# Patient Record
Sex: Female | Born: 1955 | Race: White | Hispanic: No | Marital: Single | Smoking: Current every day smoker
Health system: Southern US, Community
[De-identification: ages and names within clinical notes are randomized; demographics above are authoritative.]

## PROBLEM LIST (undated history)

## (undated) ENCOUNTER — Emergency Department (HOSPITAL_BASED_OUTPATIENT_CLINIC_OR_DEPARTMENT_OTHER): Admission: EM | Payer: Medicare Other | Source: Home / Self Care

## (undated) DIAGNOSIS — I050 Rheumatic mitral stenosis: Secondary | ICD-10-CM

## (undated) DIAGNOSIS — M5126 Other intervertebral disc displacement, lumbar region: Secondary | ICD-10-CM

## (undated) DIAGNOSIS — G8929 Other chronic pain: Secondary | ICD-10-CM

## (undated) DIAGNOSIS — E7800 Pure hypercholesterolemia, unspecified: Secondary | ICD-10-CM

## (undated) DIAGNOSIS — F331 Major depressive disorder, recurrent, moderate: Secondary | ICD-10-CM

## (undated) DIAGNOSIS — M25579 Pain in unspecified ankle and joints of unspecified foot: Secondary | ICD-10-CM

## (undated) DIAGNOSIS — G4730 Sleep apnea, unspecified: Secondary | ICD-10-CM

## (undated) DIAGNOSIS — I2510 Atherosclerotic heart disease of native coronary artery without angina pectoris: Secondary | ICD-10-CM

## (undated) DIAGNOSIS — K219 Gastro-esophageal reflux disease without esophagitis: Secondary | ICD-10-CM

## (undated) DIAGNOSIS — E119 Type 2 diabetes mellitus without complications: Secondary | ICD-10-CM

## (undated) DIAGNOSIS — Z794 Long term (current) use of insulin: Secondary | ICD-10-CM

## (undated) DIAGNOSIS — M5440 Lumbago with sciatica, unspecified side: Secondary | ICD-10-CM

## (undated) DIAGNOSIS — E1165 Type 2 diabetes mellitus with hyperglycemia: Secondary | ICD-10-CM

## (undated) DIAGNOSIS — I071 Rheumatic tricuspid insufficiency: Secondary | ICD-10-CM

## (undated) DIAGNOSIS — I639 Cerebral infarction, unspecified: Secondary | ICD-10-CM

## (undated) DIAGNOSIS — K859 Acute pancreatitis without necrosis or infection, unspecified: Secondary | ICD-10-CM

## (undated) DIAGNOSIS — J189 Pneumonia, unspecified organism: Secondary | ICD-10-CM

## (undated) DIAGNOSIS — R519 Headache, unspecified: Secondary | ICD-10-CM

## (undated) DIAGNOSIS — I351 Nonrheumatic aortic (valve) insufficiency: Secondary | ICD-10-CM

## (undated) DIAGNOSIS — J45909 Unspecified asthma, uncomplicated: Secondary | ICD-10-CM

## (undated) DIAGNOSIS — I700 Atherosclerosis of aorta: Secondary | ICD-10-CM

## (undated) DIAGNOSIS — F17218 Nicotine dependence, cigarettes, with other nicotine-induced disorders: Secondary | ICD-10-CM

## (undated) DIAGNOSIS — I340 Nonrheumatic mitral (valve) insufficiency: Secondary | ICD-10-CM

## (undated) DIAGNOSIS — E6609 Other obesity due to excess calories: Secondary | ICD-10-CM

## (undated) DIAGNOSIS — K760 Fatty (change of) liver, not elsewhere classified: Secondary | ICD-10-CM

## (undated) DIAGNOSIS — Z86718 Personal history of other venous thrombosis and embolism: Secondary | ICD-10-CM

## (undated) DIAGNOSIS — D649 Anemia, unspecified: Secondary | ICD-10-CM

## (undated) DIAGNOSIS — M549 Dorsalgia, unspecified: Secondary | ICD-10-CM

## (undated) DIAGNOSIS — J449 Chronic obstructive pulmonary disease, unspecified: Secondary | ICD-10-CM

## (undated) DIAGNOSIS — Z960 Presence of urogenital implants: Secondary | ICD-10-CM

## (undated) DIAGNOSIS — E66811 Obesity, class 1: Secondary | ICD-10-CM

## (undated) DIAGNOSIS — T7840XA Allergy, unspecified, initial encounter: Secondary | ICD-10-CM

## (undated) DIAGNOSIS — F419 Anxiety disorder, unspecified: Secondary | ICD-10-CM

## (undated) DIAGNOSIS — M1990 Unspecified osteoarthritis, unspecified site: Secondary | ICD-10-CM

## (undated) DIAGNOSIS — T8859XA Other complications of anesthesia, initial encounter: Secondary | ICD-10-CM

## (undated) DIAGNOSIS — R51 Headache: Secondary | ICD-10-CM

## (undated) DIAGNOSIS — N393 Stress incontinence (female) (male): Secondary | ICD-10-CM

## (undated) HISTORY — DX: Allergy, unspecified, initial encounter: T78.40XA

## (undated) HISTORY — DX: Lumbago with sciatica, unspecified side: M54.40

## (undated) HISTORY — DX: Unspecified asthma, uncomplicated: J45.909

## (undated) HISTORY — DX: Cerebral infarction, unspecified: I63.9

## (undated) HISTORY — DX: Other chronic pain: G89.29

## (undated) HISTORY — DX: Chronic obstructive pulmonary disease, unspecified: J44.9

## (undated) HISTORY — DX: Other obesity due to excess calories: E66.09

## (undated) HISTORY — DX: Headache: R51

## (undated) HISTORY — DX: Personal history of other venous thrombosis and embolism: Z86.718

## (undated) HISTORY — DX: Nonrheumatic mitral (valve) insufficiency: I34.0

## (undated) HISTORY — PX: OTHER SURGICAL HISTORY: SHX169

## (undated) HISTORY — DX: Nonrheumatic aortic (valve) insufficiency: I35.1

## (undated) HISTORY — DX: Atherosclerotic heart disease of native coronary artery without angina pectoris: I25.10

## (undated) HISTORY — DX: Presence of urogenital implants: Z96.0

## (undated) HISTORY — DX: Pure hypercholesterolemia, unspecified: E78.00

## (undated) HISTORY — DX: Headache, unspecified: R51.9

## (undated) HISTORY — PX: ANKLE SURGERY: SHX546

## (undated) HISTORY — DX: Major depressive disorder, recurrent, moderate: F33.1

## (undated) HISTORY — DX: Stress incontinence (female) (male): N39.3

## (undated) HISTORY — PX: HERNIA REPAIR: SHX51

## (undated) HISTORY — DX: Atherosclerosis of aorta: I70.0

## (undated) HISTORY — DX: Type 2 diabetes mellitus with hyperglycemia: E11.65

## (undated) HISTORY — DX: Nicotine dependence, cigarettes, with other nicotine-induced disorders: F17.218

## (undated) HISTORY — DX: Type 2 diabetes mellitus without complications: E11.9

## (undated) HISTORY — PX: BREAST REDUCTION SURGERY: SHX8

## (undated) HISTORY — DX: Obesity, class 1: E66.811

## (undated) HISTORY — DX: Rheumatic tricuspid insufficiency: I07.1

## (undated) HISTORY — DX: Pain in unspecified ankle and joints of unspecified foot: M25.579

## (undated) HISTORY — DX: Long term (current) use of insulin: Z79.4

---

## 1986-05-29 HISTORY — PX: VAGINAL HYSTERECTOMY: SUR661

## 1996-05-29 HISTORY — PX: CHOLECYSTECTOMY: SHX55

## 2009-02-03 ENCOUNTER — Emergency Department (HOSPITAL_BASED_OUTPATIENT_CLINIC_OR_DEPARTMENT_OTHER): Admission: EM | Admit: 2009-02-03 | Discharge: 2009-02-03 | Payer: Self-pay | Admitting: Emergency Medicine

## 2009-02-03 ENCOUNTER — Ambulatory Visit: Payer: Self-pay | Admitting: Radiology

## 2010-09-02 LAB — PROTIME-INR
INR: 1 (ref 0.00–1.49)
Prothrombin Time: 12.9 seconds (ref 11.6–15.2)

## 2010-09-02 LAB — URINALYSIS, ROUTINE W REFLEX MICROSCOPIC
Bilirubin Urine: NEGATIVE
Glucose, UA: NEGATIVE mg/dL
Hgb urine dipstick: NEGATIVE
Ketones, ur: NEGATIVE mg/dL
Nitrite: NEGATIVE
Protein, ur: NEGATIVE mg/dL
Specific Gravity, Urine: 1.034 — ABNORMAL HIGH (ref 1.005–1.030)
Urobilinogen, UA: 0.2 mg/dL (ref 0.0–1.0)
pH: 6.5 (ref 5.0–8.0)

## 2010-09-02 LAB — CBC
HCT: 33.7 % — ABNORMAL LOW (ref 36.0–46.0)
Hemoglobin: 11.4 g/dL — ABNORMAL LOW (ref 12.0–15.0)
MCHC: 33.9 g/dL (ref 30.0–36.0)
MCV: 85.1 fL (ref 78.0–100.0)
Platelets: 279 10*3/uL (ref 150–400)
RBC: 3.96 MIL/uL (ref 3.87–5.11)
RDW: 13.8 % (ref 11.5–15.5)
WBC: 6.4 10*3/uL (ref 4.0–10.5)

## 2010-09-02 LAB — DIFFERENTIAL
Basophils Absolute: 0 10*3/uL (ref 0.0–0.1)
Basophils Relative: 0 % (ref 0–1)
Eosinophils Absolute: 0.2 10*3/uL (ref 0.0–0.7)
Eosinophils Relative: 3 % (ref 0–5)
Lymphocytes Relative: 18 % (ref 12–46)
Lymphs Abs: 1.2 10*3/uL (ref 0.7–4.0)
Monocytes Absolute: 0.4 10*3/uL (ref 0.1–1.0)
Monocytes Relative: 6 % (ref 3–12)
Neutro Abs: 4.6 10*3/uL (ref 1.7–7.7)
Neutrophils Relative %: 73 % (ref 43–77)

## 2010-09-02 LAB — URINE CULTURE
Colony Count: NO GROWTH
Culture: NO GROWTH

## 2010-09-02 LAB — BASIC METABOLIC PANEL
BUN: 11 mg/dL (ref 6–23)
CO2: 29 mEq/L (ref 19–32)
Calcium: 9.4 mg/dL (ref 8.4–10.5)
Chloride: 101 mEq/L (ref 96–112)
Creatinine, Ser: 0.9 mg/dL (ref 0.4–1.2)
GFR calc Af Amer: >60 mL/min (ref >60)
GFR calc non Af Amer: >60 mL/min (ref >60)
Glucose, Bld: 100 mg/dL — ABNORMAL HIGH (ref 70–99)
Potassium: 4.1 mEq/L (ref 3.5–5.1)
Sodium: 138 mEq/L (ref 135–145)

## 2010-09-02 LAB — APTT: aPTT: 31 seconds (ref 24–37)

## 2013-09-29 DIAGNOSIS — K861 Other chronic pancreatitis: Secondary | ICD-10-CM | POA: Insufficient documentation

## 2013-09-29 DIAGNOSIS — E119 Type 2 diabetes mellitus without complications: Secondary | ICD-10-CM | POA: Insufficient documentation

## 2013-09-29 DIAGNOSIS — K862 Cyst of pancreas: Secondary | ICD-10-CM | POA: Insufficient documentation

## 2014-02-07 DIAGNOSIS — Z9109 Other allergy status, other than to drugs and biological substances: Secondary | ICD-10-CM | POA: Insufficient documentation

## 2014-02-07 DIAGNOSIS — Z96659 Presence of unspecified artificial knee joint: Secondary | ICD-10-CM | POA: Insufficient documentation

## 2014-03-25 ENCOUNTER — Other Ambulatory Visit: Payer: Self-pay

## 2014-03-25 ENCOUNTER — Other Ambulatory Visit: Payer: Self-pay | Admitting: Obstetrics and Gynecology

## 2014-03-25 DIAGNOSIS — N6002 Solitary cyst of left breast: Secondary | ICD-10-CM

## 2014-05-07 DIAGNOSIS — T85848A Pain due to other internal prosthetic devices, implants and grafts, initial encounter: Secondary | ICD-10-CM | POA: Insufficient documentation

## 2014-05-07 DIAGNOSIS — Z720 Tobacco use: Secondary | ICD-10-CM | POA: Insufficient documentation

## 2014-05-07 DIAGNOSIS — M7662 Achilles tendinitis, left leg: Secondary | ICD-10-CM | POA: Insufficient documentation

## 2014-05-07 DIAGNOSIS — M19171 Post-traumatic osteoarthritis, right ankle and foot: Secondary | ICD-10-CM | POA: Insufficient documentation

## 2014-05-07 DIAGNOSIS — M19071 Primary osteoarthritis, right ankle and foot: Secondary | ICD-10-CM | POA: Insufficient documentation

## 2014-06-02 ENCOUNTER — Ambulatory Visit
Admission: RE | Admit: 2014-06-02 | Discharge: 2014-06-02 | Disposition: A | Discharge status: Home / Self Care | Payer: Medicaid Other | Source: Ambulatory Visit | Attending: Obstetrics and Gynecology | Admitting: Obstetrics and Gynecology

## 2014-06-02 ENCOUNTER — Other Ambulatory Visit: Payer: Self-pay | Admitting: Obstetrics and Gynecology

## 2014-06-02 DIAGNOSIS — N6002 Solitary cyst of left breast: Secondary | ICD-10-CM

## 2014-06-02 DIAGNOSIS — N6310 Unspecified lump in the right breast, unspecified quadrant: Secondary | ICD-10-CM

## 2014-07-20 DIAGNOSIS — M7062 Trochanteric bursitis, left hip: Secondary | ICD-10-CM | POA: Insufficient documentation

## 2014-08-25 ENCOUNTER — Other Ambulatory Visit: Payer: Self-pay | Admitting: Obstetrics and Gynecology

## 2014-09-24 ENCOUNTER — Other Ambulatory Visit: Payer: Self-pay | Admitting: Obstetrics and Gynecology

## 2014-09-24 DIAGNOSIS — N6310 Unspecified lump in the right breast, unspecified quadrant: Secondary | ICD-10-CM

## 2014-11-11 ENCOUNTER — Other Ambulatory Visit: Payer: Medicaid Other

## 2015-03-15 ENCOUNTER — Institutional Professional Consult (permissible substitution): Payer: Medicaid Other | Admitting: Internal Medicine

## 2015-03-24 ENCOUNTER — Ambulatory Visit (INDEPENDENT_AMBULATORY_CARE_PROVIDER_SITE_OTHER): Payer: Medicare Other | Admitting: Internal Medicine

## 2015-03-24 ENCOUNTER — Ambulatory Visit (INDEPENDENT_AMBULATORY_CARE_PROVIDER_SITE_OTHER)
Admission: RE | Admit: 2015-03-24 | Discharge: 2015-03-24 | Disposition: A | Discharge status: Home / Self Care | Payer: Medicare Other | Source: Ambulatory Visit | Attending: Internal Medicine | Admitting: Internal Medicine

## 2015-03-24 ENCOUNTER — Encounter: Payer: Self-pay | Admitting: Internal Medicine

## 2015-03-24 VITALS — BP 114/76 | HR 63 | Ht 62.0 in | Wt 196.2 lb

## 2015-03-24 DIAGNOSIS — F17210 Nicotine dependence, cigarettes, uncomplicated: Secondary | ICD-10-CM | POA: Insufficient documentation

## 2015-03-24 DIAGNOSIS — J449 Chronic obstructive pulmonary disease, unspecified: Secondary | ICD-10-CM

## 2015-03-24 DIAGNOSIS — J441 Chronic obstructive pulmonary disease with (acute) exacerbation: Secondary | ICD-10-CM

## 2015-03-24 NOTE — Progress Notes (Signed)
Subjective:    Patient ID: Rose [name], female    DOB: [birth year],   MRN: [id number]  HPI  79 yowf active smoker referred 03/24/2015 by Rose [name] for copd eval with GOLD II criteria on initial eval.   03/24/2015 1st [facility] office visit/ Rose [name]   Chief Complaint  Patient presents with  . Pulmonary Consult    Pt referred by Rose [name] Rose [name] ) for COPD: pt states she started smoking again and she is having some pain on the left side of her back.  pt c/o increase SOB, chest tightness, dry and prod cough tanish in color and some wheezing.   Pain in L lower chest/ back present x several years and sometimes worse with deep breath and variably sob x years, yeast from symbicort so just uses ventolin rarely until 2 weeks resumed smoked then needed twice daily.  On 02 for years 2lpm at hs and does sleep fine trazadone  Then wakes up usual hour but no need for noct or early am ventolin but does have some am congestion  Best days uses scooter to do shopping due to ankles and feet pain  Much worse overall since resumed smoking 2 week prior to OV    No obvious other patterns in day to day or daytime variabilty or assoc chronic cough or cp orsubjective wheeze or overt   hb symptoms. No unusual exp hx or h/o childhood pna/ asthma or knowledge of premature birth.  Sleeping ok without nocturnal  or early am exacerbation  of respiratory  c/o's or need for noct saba. Also denies any obvious fluctuation of symptoms with weather or environmental changes or other aggravating or alleviating factors except as outlined above   Current Medications, Allergies, Complete Past Medical History, Past Surgical History, Family History, and Social History were reviewed in [employer] record.            Review of Systems  Constitutional: Positive for unexpected weight change. Negative for fever.  HENT: Positive for dental problem, ear pain, rhinorrhea, sinus pressure,  sneezing and sore throat. Negative for congestion, nosebleeds, postnasal drip and trouble swallowing.   Eyes: Positive for redness and itching.  Respiratory: Positive for cough, chest tightness, shortness of breath and wheezing.   Cardiovascular: Positive for leg swelling. Negative for palpitations.  Gastrointestinal: Positive for nausea and vomiting.  Genitourinary: Negative for dysuria.  Musculoskeletal: Positive for joint swelling.  Skin: Negative for rash.  Neurological: Negative for headaches.  Hematological: Bruises/bleeds easily.  Psychiatric/Behavioral: Negative for dysphoric mood. The patient is nervous/anxious.        Objective:   Physical Exam  Hoarse amb wf nad / min  congested cough   Wt Readings from Last 3 Encounters:  03/24/15 196 lb 3.2 oz (88.996 kg)    Vital signs reviewed     HEENT: nl dentition, turbinates, and orophanx. Nl external ear canals without cough reflex   NECK :  without JVD/Nodes/TM/ nl carotid upstrokes bilaterally   LUNGS: no acc muscle use, clear to A and P bilaterally without cough on insp or exp maneuvers   CV:  RRR  no s3 or murmur or increase in P2, no edema   ABD:  soft and nontender with nl excursion in the supine position. No bruits or organomegaly, bowel sounds nl  MS:  warm without deformities, calf tenderness, cyanosis or clubbing  SKIN: warm and dry without lesions    NEURO:  alert, approp, no deficits  CXR PA and Lateral:   03/24/2015 :    I personally reviewed images and agree with radiology impression as follows:    The heart size and mediastinal contours are within normal limits. Both lungs are clear. No evidence of pleural effusion. Mild hyperinflation is suspicious for COPD. The visualized skeletal structures are unremarkable.      Assessment & Plan:

## 2015-03-24 NOTE — Assessment & Plan Note (Signed)

## 2015-03-24 NOTE — Assessment & Plan Note (Addendum)
Spirometry 03/24/2015 FEV1  1.75 (75%) ratio 64  - 03/24/2015   Walked RA  2 laps @ 185 ft each stopped due to End of study, slow pace, no sob or desat  - ankles stopped her   The proper method of use, as well as anticipated side effects, of a metered-dose inhaler are discussed and demonstrated to the patient. Improved effectiveness after extensive coaching during this visit to a level of approximately  90%   She barely meets the criteria GOLD II COPD despite the fact that I'm seeing her on one of her worst days before she used any albuterol at all and note we did not do a postbronchodilator study. The point is that she really has such mild disease she does not need to be evaluated further by a pulmonologist in congestive use when necessary albuterol as she has in the past and she cannot tolerate inhaled steroids and probably should not be on just a lobular given this mildness of her condition.  I also don't think this pain that she's been having for 2 years in the lower left chest and upper back has anything to do with her lungs but offered to review with her any scans of the chest  done in [facility] if she'll bring them back with her since we don't have access to them through the computer.  The main issue that will make a difference to her over the long run is not she sees what medicine she takes that whether she chooses to smoke. Please see cigarette smoking as a separate assessment and plan  I had an extended discussion with the patient reviewing all relevant studies completed to date and  Lasting 35 minutes of a 60 minute visit    Each maintenance medication was reviewed in detail including most importantly the difference between maintenance and prns and under what circumstances the prns are to be triggered using an action plan format that is not reflected in the computer generated alphabetically organized AVS.    Please see instructions for details which were reviewed in writing and the patient  given a copy highlighting the part that I personally wrote and discussed at today's ov.

## 2015-03-24 NOTE — Patient Instructions (Addendum)
You only have mild copd and you always will if you stop smoking now  Only use your albuterol as a rescue medication to be used if you can't catch your breath by resting or doing a relaxed purse lip breathing pattern.  - The less you use it, the better it will work when you need it. - Ok to use up to 2 puffs  every 4 hours if you must but call for immediate appointment if use goes up over your usual need - Don't leave home without it !!  (think of it like the spare tire for your car)   Please remember to go to the x-ray department downstairs for your tests - we will call you with the results when they are available.

## 2015-03-25 NOTE — Progress Notes (Signed)
Quick Note:  Spoke with pt and notified of results per Dr. Sherene Sires. Pt verbalized understanding and denied any questions. Pain in unchanged and she wishes to f/u prn ______

## 2015-03-29 ENCOUNTER — Ambulatory Visit: Payer: Self-pay | Admitting: Podiatry

## 2015-03-30 ENCOUNTER — Encounter: Payer: Self-pay | Admitting: Podiatry

## 2015-03-30 ENCOUNTER — Other Ambulatory Visit: Payer: Self-pay

## 2015-03-30 ENCOUNTER — Ambulatory Visit (INDEPENDENT_AMBULATORY_CARE_PROVIDER_SITE_OTHER): Payer: Medicare Other | Admitting: Podiatry

## 2015-03-30 VITALS — BP 128/83 | HR 97 | Ht 62.0 in | Wt 196.0 lb

## 2015-03-30 DIAGNOSIS — M19172 Post-traumatic osteoarthritis, left ankle and foot: Secondary | ICD-10-CM

## 2015-03-30 DIAGNOSIS — M25572 Pain in left ankle and joints of left foot: Secondary | ICD-10-CM

## 2015-03-30 DIAGNOSIS — M79672 Pain in left foot: Secondary | ICD-10-CM | POA: Diagnosis not present

## 2015-03-30 DIAGNOSIS — M19072 Primary osteoarthritis, left ankle and foot: Secondary | ICD-10-CM | POA: Insufficient documentation

## 2015-03-30 DIAGNOSIS — M79606 Pain in leg, unspecified: Secondary | ICD-10-CM

## 2015-03-30 NOTE — Progress Notes (Signed)
2011 injury to right ankle, broken ankle that was repaired with screw and plate in 9147. Hardware was taken out January 2016.  Sprained left ankle had no treatment other than pain pills.   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  Having intense ankle pain on left x 1 year.   Review of Systems - General ROS: negative for - fatigue, fever, hot flashes, malaise, night sweats or sleep disturbance Ophthalmic ROS: negative ENT ROS: negative Allergy and Immunology ROS: negative Hematological and Lymphatic ROS: negative Endocrine ROS: negative Breast ROS: negative for breast lumps Respiratory ROS: Mild COPD. Cardiovascular ROS: Has leaky heart valva. Gastrointestinal ROS: IBS with diarrhea and constipation. Genito-Urinary ROS: IBS with diarrhea and constipation. Musculoskeletal ROS: Bilateral knee surgeries. Left hip bursitis that is treated with injection. Neurological ROS: Sciatica for 6 years, herniated 3 left lower lumbar disc.  Dermatological ROS: Both arms are getting depigmenation.   Objective: Dermatologic:  Normal findings without any abnormal lesions. Neurologic: All epicritic and tactile sensations grossly intact bilateral. Vascular: All pedal pulses are palpable bilateral. Orthopedic: Unable to go through range of motion on rearfoot and ankle bilateral. Patient is unable to bear pain when joints are moved.  Positive of mild edema bilateral.  Radiographic examination of the left ankle reveal adductus type foot in AP view with mild digital contracture 4th and 5th. No significant movement of sesamoid bones. Lateral view shoe protruding inferior calcaneal spur, posterior os trigonum separated, hypertrophic bone formation at the Talonavicular joint dorsal aspect, and elevated first ray. No abnormal osseous fragments noted in ankle or Subtalar joint area of the left foot.    Assessment: Arthropathy ankles bilateral status post ankle injury.  Sinus tarsitis left foot.  Plan: Reviewed findings and available treatment options. As per request, left Sinus tarsi area was injected with mixture of 4 mg Dexamethasone, 4 mg Triamcinolone, and 1 cc of 0.5% Marcaine plain. Patient tolerated well without difficulty.  Return in one week to discuss the findings and possible MRI referral.

## 2015-03-30 NOTE — Patient Instructions (Signed)
Seen for pain on both foot and ankle. Cortisone injection given to left sinus tarsi area. Return in one week.

## 2015-03-31 ENCOUNTER — Telehealth: Payer: Self-pay | Admitting: *Deleted

## 2015-03-31 NOTE — Telephone Encounter (Signed)
03/31/2015 Dr.Sheard Patient called this afternoon and wanted me to tell you the Cortisone Injection she got yesterday didn't help. She has an appointment to come back next Tuesday and mentioned something also about a MRI??

## 2015-04-06 ENCOUNTER — Ambulatory Visit: Payer: Medicare Other | Admitting: Podiatry

## 2015-04-16 ENCOUNTER — Other Ambulatory Visit: Payer: Self-pay

## 2015-04-16 ENCOUNTER — Encounter: Payer: Self-pay | Admitting: Podiatry

## 2015-04-16 ENCOUNTER — Ambulatory Visit (INDEPENDENT_AMBULATORY_CARE_PROVIDER_SITE_OTHER): Payer: Medicare Other | Admitting: Podiatry

## 2015-04-16 DIAGNOSIS — M25572 Pain in left ankle and joints of left foot: Secondary | ICD-10-CM | POA: Diagnosis not present

## 2015-04-16 DIAGNOSIS — M19072 Primary osteoarthritis, left ankle and foot: Secondary | ICD-10-CM | POA: Diagnosis not present

## 2015-04-16 DIAGNOSIS — M21962 Unspecified acquired deformity of left lower leg: Secondary | ICD-10-CM | POA: Diagnosis not present

## 2015-04-16 DIAGNOSIS — M79605 Pain in left leg: Secondary | ICD-10-CM

## 2015-04-16 MED ORDER — TRIAMCINOLONE ACETONIDE 10 MG/ML IJ SUSP: 10.0000 mg | Freq: Once | INTRAMUSCULAR | Status: DC

## 2015-04-16 MED ORDER — OXYCODONE-ACETAMINOPHEN 10-325 MG PO TABS: 1.0000 | ORAL_TABLET | Freq: Four times a day (QID) | ORAL | Status: DC | PRN

## 2015-04-16 NOTE — Patient Instructions (Signed)
Pain in left ankle.  Reviewed findings and treatment plan. MRI ordered. We will make arrangement. Pain medication prescribed. Another injection given to left ankle. Will contact patient about MRI schedule.

## 2015-04-16 NOTE — Progress Notes (Signed)
Subjective: 59 year old female presents complaining of pain in left ankle. Pain is intense and brings tears.  Last visit she had injection that did not help her. Pain is along the distal margin of lateral Malleoli.  Duration of the pain is over a year. Stated that she was previously treated at [facility] with Orthotics that she could not tolerate.  Past surgical history reveal: 2011 injury to right ankle, broken ankle that was repaired with screw and plate in 3845. Hardware was taken out January 2016.  Sprained left ankle had no treatment other than pain pills.  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   Objective: Dermatologic:  Normal findings without any abnormal lesions. Neurologic: All epicritic and tactile sensations grossly intact bilateral. Vascular: All pedal pulses are palpable bilateral. Orthopedic: Unable to go through range of motion on rearfoot and ankle bilateral. Patient is unable to bear pain when joints are moved.  Positive of mild edema bilateral.  Radiographic examination of the left ankle reveal adductus type foot in AP view with mild digital contracture 4th and 5th. No significant movement of sesamoid bones. Lateral view shoe protruding inferior calcaneal spur, posterior os trigonum separated, hypertrophic bone formation at the Talonavicular joint dorsal aspect, and elevated first ray. No abnormal osseous fragments noted in ankle or Subtalar joint area of the left foot.   Assessment: Arthropathy ankles bilateral status post ankle injury.  Osteoarthritis left ankle. Elevated metatarsal bone with rearfoot pronation upon loading of forefoot.  . Plan: Reviewed findings and available treatment options. As per request 2nd injection given.  The injection was given to left ankle inferior malleoli region with mixture of 4 mg Dexamethasone, 4 mg Triamcinolone, and 1 cc  of 0.5% Marcaine plain. Patient tolerated well without difficulty.  Pain medication prescribed with Percocet 10/325. MRI ordered.

## 2015-04-24 ENCOUNTER — Ambulatory Visit (HOSPITAL_BASED_OUTPATIENT_CLINIC_OR_DEPARTMENT_OTHER)
Admission: RE | Admit: 2015-04-24 | Discharge: 2015-04-24 | Disposition: A | Discharge status: Home / Self Care | Payer: Medicare Other | Source: Ambulatory Visit | Attending: Podiatry | Admitting: Podiatry

## 2015-04-24 DIAGNOSIS — M7672 Peroneal tendinitis, left leg: Secondary | ICD-10-CM | POA: Insufficient documentation

## 2015-04-24 DIAGNOSIS — M659 Synovitis and tenosynovitis, unspecified: Secondary | ICD-10-CM | POA: Insufficient documentation

## 2015-04-24 DIAGNOSIS — M25572 Pain in left ankle and joints of left foot: Secondary | ICD-10-CM | POA: Diagnosis not present

## 2015-04-24 DIAGNOSIS — M7989 Other specified soft tissue disorders: Secondary | ICD-10-CM | POA: Insufficient documentation

## 2015-05-06 ENCOUNTER — Ambulatory Visit: Payer: Medicare Other | Admitting: Podiatry

## 2015-05-12 ENCOUNTER — Ambulatory Visit: Payer: Medicare Other | Admitting: Podiatry

## 2015-05-13 ENCOUNTER — Ambulatory Visit (INDEPENDENT_AMBULATORY_CARE_PROVIDER_SITE_OTHER): Payer: Medicare Other | Admitting: Podiatry

## 2015-05-13 ENCOUNTER — Encounter: Payer: Self-pay | Admitting: Podiatry

## 2015-05-13 VITALS — BP 117/76 | HR 96

## 2015-05-13 DIAGNOSIS — M19072 Primary osteoarthritis, left ankle and foot: Secondary | ICD-10-CM

## 2015-05-13 DIAGNOSIS — M21962 Unspecified acquired deformity of left lower leg: Secondary | ICD-10-CM

## 2015-05-13 MED ORDER — OXYCODONE-ACETAMINOPHEN 10-325 MG PO TABS: 1.0000 | ORAL_TABLET | Freq: Three times a day (TID) | ORAL | Status: DC | PRN

## 2015-05-13 NOTE — Patient Instructions (Signed)
Seen for pain in left foot. MRI report reviewed. Discussed surgical option to realign foot. Cotton osteotomy with bone graft discussed.

## 2015-05-13 NOTE — Progress Notes (Signed)
Subjective: 59 year old female presents to go over the MRI report. The result explained to the patient. Injection is not helping but still having pain at lateral ankle distal lateral to lateral malleoli and swelling at medial ankle left foot.   Past surgical history reveal: 2011 injury to right ankle, broken ankle that was repaired with screw and plate in 7371. Hardware was taken out January 2016.  Sprained left ankle had no treatment other than pain pills.  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   Objective: Dermatologic:  Normal findings without any abnormal lesions. Neurologic: All epicritic and tactile sensations grossly intact bilateral. Vascular: All pedal pulses are palpable bilateral. Orthopedic: Elevated first ray with lateral weight shifting left > right.  Previous x-ray indicated: in AP view with mild digital contracture 4th and 5th. No significant movement of sesamoid bones. Lateral view shoe protruding inferior calcaneal spur, posterior os trigonum separated, hypertrophic bone formation at the Talonavicular joint dorsal aspect, and elevated first ray. No abnormal osseous fragments noted in ankle or Subtalar joint area of the left foot.   Assessment: Arthropathy ankles bilateral status post ankle injury.  Osteoarthritis left ankle. Elevated metatarsal bone with rearfoot pronation upon loading of forefoot.  . Plan: Reviewed findings and available treatment options. Reviewed possible benefit of Cotton osteotomy with bone graft to plantar flex the first ray and restore forefoot alignment.  Consent form reviewed for Cotton osteotomy with bone graft left foot. Pain medication prescribed with Percocet 10/325.

## 2015-06-03 ENCOUNTER — Ambulatory Visit (INDEPENDENT_AMBULATORY_CARE_PROVIDER_SITE_OTHER): Payer: Medicare Other | Admitting: Podiatry

## 2015-06-03 ENCOUNTER — Encounter: Payer: Self-pay | Admitting: Podiatry

## 2015-06-03 VITALS — BP 141/89 | HR 106

## 2015-06-03 DIAGNOSIS — M19072 Primary osteoarthritis, left ankle and foot: Secondary | ICD-10-CM | POA: Diagnosis not present

## 2015-06-03 DIAGNOSIS — M21962 Unspecified acquired deformity of left lower leg: Secondary | ICD-10-CM | POA: Diagnosis not present

## 2015-06-03 MED ORDER — DIPHENHYDRAMINE HCL 25 MG PO TABS: 50.0000 mg | ORAL_TABLET | Freq: Three times a day (TID) | ORAL | Status: DC | PRN

## 2015-06-03 MED ORDER — OXYCODONE-ACETAMINOPHEN 10-325 MG PO TABS: 1.0000 | ORAL_TABLET | Freq: Two times a day (BID) | ORAL | Status: DC | PRN

## 2015-06-03 NOTE — Progress Notes (Signed)
Subjective: 60 year old female presents complaining of painful ankles bilateral.  Achilles tendon on right ankle been hurting x several months. Lost her mother Nov. 3 2016.  Stated that pain medication is only thing that helps her. She was taking with Benadryl that ables her to take Percocet without problem.   Pain on right ankle. Achilles tendon pain on back of heel for a few months. She lost 25 lbs since she lost her mother, 04/01/15.  Injection has not helped. She is not able to wear custom orthotics.  Still having pain at lateral ankle distal lateral to lateral malleoli and swelling at medial ankle left foot.   Past surgical history reveal: 2011 injury to right ankle, broken ankle that was repaired with screw and plate in 3154. Hardware was taken out January 2016.  Sprained left ankle had no treatment other than pain pills.  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   Objective: Dermatologic:  Normal findings without any abnormal lesions. Neurologic: All epicritic and tactile sensations grossly intact bilateral. Vascular: All pedal pulses are palpable bilateral. Orthopedic: Elevated first ray with lateral weight shifting left > right.  Previous x-ray indicated: in AP view with mild digital contracture 4th and 5th. No significant movement of sesamoid bones. Lateral view shoe protruding inferior calcaneal spur, posterior os trigonum separated, hypertrophic bone formation at the Talonavicular joint dorsal aspect, and elevated first ray. No abnormal osseous fragments noted in ankle or Subtalar joint area of the left foot.   Assessment: Arthropathy ankles bilateral status post ankle injury.  Osteoarthritis left ankle. Elevated metatarsal bone with rearfoot pronation upon loading of forefoot.  . Plan: Reviewed findings and available treatment options. She is not able to have any  surgery since there is no one who can take care of her at home.  Pain medication prescribed with Percocet 10/325 with Benadryl as per request.

## 2015-06-03 NOTE — Patient Instructions (Addendum)
Seen for pain in both ankles. May benefit from Ritch brace on left side. Pain medication prescribed.

## 2015-07-12 ENCOUNTER — Encounter: Payer: Self-pay | Admitting: Podiatry

## 2015-07-12 ENCOUNTER — Ambulatory Visit (INDEPENDENT_AMBULATORY_CARE_PROVIDER_SITE_OTHER): Payer: Medicare Other | Admitting: Podiatry

## 2015-07-12 VITALS — BP 134/83 | HR 100

## 2015-07-12 DIAGNOSIS — M21962 Unspecified acquired deformity of left lower leg: Secondary | ICD-10-CM | POA: Diagnosis not present

## 2015-07-12 DIAGNOSIS — M25572 Pain in left ankle and joints of left foot: Secondary | ICD-10-CM | POA: Diagnosis not present

## 2015-07-12 DIAGNOSIS — M19072 Primary osteoarthritis, left ankle and foot: Secondary | ICD-10-CM | POA: Diagnosis not present

## 2015-07-12 MED ORDER — OXYCODONE-ACETAMINOPHEN 10-325 MG PO TABS: 1.0000 | ORAL_TABLET | Freq: Three times a day (TID) | ORAL | Status: DC | PRN

## 2015-07-12 NOTE — Progress Notes (Signed)
Subjective: 60 year old female presents complaining of painful ankles bilateral. Right medial ankle has burning feeling. Stated that pain medication is only thing that helps her. Patient was able to get separate prescription from her PCP to combat her allergic reaction to pain medication.   Past history:  Achilles tendon on right ankle been hurting x several months.  Lost her mother Nov. 3 2016.  Previous injection did not help her.    Past surgical history reveal: 2011 injury to right ankle, broken ankle that was repaired with screw and plate in 9476. Hardware was taken out January 2016.  Sprained left ankle had no treatment other than pain pills.  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   Objective: Dermatologic:  Normal findings without any abnormal lesions. Neurologic: All epicritic and tactile sensations grossly intact bilateral. Vascular: All pedal pulses are palpable bilateral. Positive of mild edema on right ankle medial aspect with pain.  Orthopedic: Elevated first ray with lateral weight shifting left > right.  Previous x-ray indicated: in AP view with mild digital contracture 4th and 5th. No significant movement of sesamoid bones. Lateral view revealed protruding inferior calcaneal spur, posterior os trigonum separated, hypertrophic bone formation at the Talonavicular joint dorsal aspect, and elevated first ray. No abnormal osseous fragments noted in ankle or Subtalar joint area of the left foot.   Assessment: Arthropathy ankles bilateral status post ankle injury.  Osteoarthritis left ankle. Elevated metatarsal bone with rearfoot pronation upon loading of forefoot.  . Plan: Reviewed findings and available treatment options. Patient requested for surgical option on heel spur but wanted to wait till Spring.  Pain medication prescribed with Percocet 10/325.

## 2015-07-12 NOTE — Patient Instructions (Signed)
Seen for bilateral ankle pain.  Not able to have spurs removed at this time. Pain medication renewed. Return as needed.

## 2015-07-20 ENCOUNTER — Other Ambulatory Visit: Payer: Self-pay | Admitting: Physician Assistant

## 2015-07-20 ENCOUNTER — Other Ambulatory Visit: Payer: Self-pay | Admitting: Obstetrics and Gynecology

## 2015-07-20 DIAGNOSIS — N6310 Unspecified lump in the right breast, unspecified quadrant: Secondary | ICD-10-CM

## 2015-07-20 DIAGNOSIS — R928 Other abnormal and inconclusive findings on diagnostic imaging of breast: Secondary | ICD-10-CM

## 2015-08-12 ENCOUNTER — Ambulatory Visit (INDEPENDENT_AMBULATORY_CARE_PROVIDER_SITE_OTHER): Payer: Medicare Other | Admitting: Podiatry

## 2015-08-12 ENCOUNTER — Encounter: Payer: Self-pay | Admitting: Podiatry

## 2015-08-12 VITALS — BP 130/78 | HR 102 | Ht 62.0 in | Wt 179.0 lb

## 2015-08-12 DIAGNOSIS — M21962 Unspecified acquired deformity of left lower leg: Secondary | ICD-10-CM | POA: Diagnosis not present

## 2015-08-12 DIAGNOSIS — M19072 Primary osteoarthritis, left ankle and foot: Secondary | ICD-10-CM | POA: Diagnosis not present

## 2015-08-12 DIAGNOSIS — M79606 Pain in leg, unspecified: Secondary | ICD-10-CM

## 2015-08-12 MED ORDER — OXYCODONE-ACETAMINOPHEN 10-325 MG PO TABS: 1.0000 | ORAL_TABLET | Freq: Three times a day (TID) | ORAL | Status: DC | PRN

## 2015-08-12 NOTE — Patient Instructions (Addendum)
Pain in both ankles and heels. Medication re-ordered. Return as needed.

## 2015-08-12 NOTE — Progress Notes (Signed)
Subjective: 60 year old female presents complaining of painful ankles bilateral. Stated that pain medication is only thing that helps her. Patient was able to get separate prescription from her PCP to combat her allergic reaction to pain medication.   Past history:  Achilles tendon on right ankle been hurting x several months.  Lost her mother Nov. 3, 2016.  Previous injection did not help her.   Past surgical history reveal: 2011 injury to right ankle, broken ankle that was repaired with screw and plate in 4166. Hardware was taken out January 2016.  Sprained left ankle had no treatment other than pain pills.  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   Objective: Dermatologic:  Normal findings without any abnormal lesions. Neurologic: All epicritic and tactile sensations grossly intact bilateral. Vascular: All pedal pulses are palpable bilateral. Positive of mild edema on right ankle medial aspect with pain. Increased warmth on right ankle joint. Orthopedic: Elevated first ray with lateral weight shifting left > right. Severe pain with range of motion on both rearfoot.   Previous x-ray indicated: in AP view with mild digital contracture 4th and 5th. No significant movement of sesamoid bones.  Lateral view revealed protruding inferior calcaneal spur, posterior os trigonum separated, hypertrophic bone formation at the Talonavicular joint dorsal aspect, and elevated first ray.  No abnormal osseous fragments in ankle or Subtalar joint area of the left foot.   Assessment: Arthropathy ankles bilateral, status post ankle injury left.  Osteoarthritis left ankle. Elevated metatarsal bone with rearfoot pronation upon loading of forefoot.  . Plan: Reviewed findings and available treatment options. Patient requested for surgical option on heel spur but wanted to wait till Spring.  Pain  medication prescribed with Percocet 10/325.

## 2015-09-09 ENCOUNTER — Encounter: Payer: Self-pay | Admitting: Podiatry

## 2015-09-09 ENCOUNTER — Ambulatory Visit (INDEPENDENT_AMBULATORY_CARE_PROVIDER_SITE_OTHER): Payer: Medicare Other | Admitting: Podiatry

## 2015-09-09 VITALS — BP 122/72 | HR 97

## 2015-09-09 DIAGNOSIS — M722 Plantar fascial fibromatosis: Secondary | ICD-10-CM | POA: Diagnosis not present

## 2015-09-09 DIAGNOSIS — M79671 Pain in right foot: Secondary | ICD-10-CM | POA: Diagnosis not present

## 2015-09-09 DIAGNOSIS — M216X9 Other acquired deformities of unspecified foot: Secondary | ICD-10-CM | POA: Diagnosis not present

## 2015-09-09 DIAGNOSIS — M79604 Pain in right leg: Secondary | ICD-10-CM

## 2015-09-09 MED ORDER — OXYCODONE-ACETAMINOPHEN 10-325 MG PO TABS: 1.0000 | ORAL_TABLET | Freq: Three times a day (TID) | ORAL | Status: DC | PRN

## 2015-09-09 NOTE — Progress Notes (Signed)
Subjective: 60 year old female presents complaining of pain in right lateral heel, with difficulty walking x 3 weeks.  Stated that she is scheduled for procedure in her neck. Left foot pain still the same.  Right ankle bulges out with pain on heel, plantar lateral.  Past history:  Achilles tendon on right ankle been hurting x several months.  Lost her mother Nov. 3, 2016.  Previous injection did not help her.   Past surgical history reveal: 2011 injury to right ankle, broken ankle that was repaired with screw and plate in 3559. Hardware was taken out January 2016.  Sprained left ankle had no treatment other than pain pills.  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   Objective: Dermatologic:  Normal findings without any abnormal lesions. Neurologic: All epicritic and tactile sensations grossly intact bilateral. Vascular: All pedal pulses are palpable bilateral. Positive of mild edema on right ankle medial aspect with pain. Increased warmth on right ankle joint. Orthopedic: Elevated first ray with lateral weight shifting left > right. Severe pain with range of motion on both rearfoot.   Previous x-ray indicated: in AP view with mild digital contracture 4th and 5th. No significant movement of sesamoid bones.  Lateral view revealed protruding inferior calcaneal spur, posterior os trigonum separated, hypertrophic bone formation at the Talonavicular joint dorsal aspect, and elevated first ray.  No abnormal osseous fragments in ankle or Subtalar joint area of the left foot.   Assessment: Plantar fasciitis right heel. Arthropathy ankles bilateral, status post ankle injury left.  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 Elevated metatarsal bone with rearfoot pronation upon loading of forefoot.  . Plan: Reviewed findings and available treatment options. Reviewed  possible benefit from STJ arthroereisis on right to reduce excess ankle pronation and heel pain. As per discussion, right lateral heel injected with mixture of 4 mg Dexamethasone, 4 mg Triamcinolone, and 1 cc of 0.5% Marcaine plain. Patient tolerated well without difficulty.  Pain medication prescribed with Percocet 10/325.

## 2015-09-09 NOTE — Patient Instructions (Signed)
Seen for pain in right heel. Injection given. Return as needed.

## 2015-10-06 ENCOUNTER — Encounter: Payer: Self-pay | Admitting: Podiatry

## 2015-10-06 ENCOUNTER — Ambulatory Visit (INDEPENDENT_AMBULATORY_CARE_PROVIDER_SITE_OTHER): Payer: Medicare Other | Admitting: Podiatry

## 2015-10-06 VITALS — BP 115/71 | HR 98

## 2015-10-06 DIAGNOSIS — M19072 Primary osteoarthritis, left ankle and foot: Secondary | ICD-10-CM

## 2015-10-06 DIAGNOSIS — M216X9 Other acquired deformities of unspecified foot: Secondary | ICD-10-CM

## 2015-10-06 DIAGNOSIS — M79605 Pain in left leg: Secondary | ICD-10-CM

## 2015-10-06 DIAGNOSIS — M79673 Pain in unspecified foot: Secondary | ICD-10-CM | POA: Diagnosis not present

## 2015-10-06 MED ORDER — OXYCODONE-ACETAMINOPHEN 10-325 MG PO TABS: 1.0000 | ORAL_TABLET | Freq: Three times a day (TID) | ORAL | Status: DC | PRN

## 2015-10-06 NOTE — Progress Notes (Signed)
Subjective: 60 year old female presents complaining of pain in right ankle at medial malleoli area x 2 weeks.  Also hurts at lateral ankle beneath lateral malleoli area. Patient request for Cortisone injection.  Past injection lasted for about a week.  Stated that after her last two surgeries her ankle has been hurting.   Past history:  Achilles tendon on right ankle been hurting x several months.  Lost her mother Nov. 3, 2016.  Previous injection did not help her.   Past surgical history reveal: 2011 injury to right ankle, broken ankle that was repaired with screw and plate in 1157. Hardware was taken out January 2016.  Sprained left ankle had no treatment other than pain pills.  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   Objective: Dermatologic:  Normal findings without any abnormal lesions. Neurologic: All epicritic and tactile sensations grossly intact bilateral. Vascular: All pedal pulses are palpable bilateral. Positive of mild edema on right ankle medial aspect with pain. Increased warmth on right ankle joint. Orthopedic: Elevated first ray with lateral weight shifting left > right. Severe pain with range of motion on both rearfoot.   Previous x-ray indicated: in AP view with mild digital contracture 4th and 5th. No significant movement of sesamoid bones.  Lateral view revealed protruding inferior calcaneal spur, posterior os trigonum separated, hypertrophic bone formation at the Talonavicular joint dorsal aspect, and elevated first ray.  No abnormal osseous fragments in ankle or Subtalar joint area of the left foot.   Assessment: Plantar fasciitis right heel. Arthropathy ankles bilateral, status post ankle injury left.  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 Elevated metatarsal bone with rearfoot pronation upon loading of forefoot.   . Plan: Reviewed findings and available treatment options. Reviewed possible benefit from STJ arthroereisis on right to reduce excess ankle pronation and heel pain. As per discussion, injection given to right ankle at anterior medial aspect of right ankle, about 2 cm medial to the medial malleoli with mixture of 4 mg Dexamethasone, 4 mg Triamcinolone, and 1 cc of 0.5% Marcaine plain. Patient tolerated well without difficulty.  Pain medication prescribed with Percocet 10/325

## 2015-10-06 NOTE — Patient Instructions (Signed)
Seen for pain in right ankle. Injection given and pain medication prescribed. Return as needed.

## 2015-11-09 ENCOUNTER — Encounter: Payer: Self-pay | Admitting: Podiatry

## 2015-11-09 ENCOUNTER — Ambulatory Visit (INDEPENDENT_AMBULATORY_CARE_PROVIDER_SITE_OTHER): Payer: Medicare Other | Admitting: Podiatry

## 2015-11-09 VITALS — BP 107/73 | HR 107

## 2015-11-09 DIAGNOSIS — M79673 Pain in unspecified foot: Secondary | ICD-10-CM

## 2015-11-09 DIAGNOSIS — M19072 Primary osteoarthritis, left ankle and foot: Secondary | ICD-10-CM | POA: Diagnosis not present

## 2015-11-09 DIAGNOSIS — M216X9 Other acquired deformities of unspecified foot: Secondary | ICD-10-CM | POA: Diagnosis not present

## 2015-11-09 DIAGNOSIS — M79604 Pain in right leg: Secondary | ICD-10-CM

## 2015-11-09 MED ORDER — OXYCODONE-ACETAMINOPHEN 10-325 MG PO TABS: 1.0000 | ORAL_TABLET | Freq: Three times a day (TID) | ORAL | Status: DC | PRN

## 2015-11-09 NOTE — Progress Notes (Signed)
Subjective: 59 year old female presents complaining of pain in right ankle at medial malleoli area x 2 weeks.  Also hurts at lateral ankle beneath lateral malleoli area. Patient request for Cortisone injection.  Past injection lasted for short time but still request for another injection to left ankle.    Past history:  Achilles tendonitis several months.  Lost her mother Nov. 3, 2016.  Previous injection did not help her.   Past surgical history reveal: 2011 injury to right ankle, broken ankle that was repaired with screw and plate in 0459. Hardware was taken out January 2016.  Sprained left ankle had no treatment other than pain pills.  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   Objective: Dermatologic:  Normal findings without any abnormal lesions. Neurologic: All epicritic and tactile sensations grossly intact bilateral. Vascular: All pedal pulses are palpable bilateral. Positive of mild edema on right ankle medial aspect with pain. Increased warmth on right ankle joint. Orthopedic: Elevated first ray with lateral weight shifting left > right. Severe pain with range of motion on both rearfoot.   Previous x-ray indicated: in AP view with mild digital contracture 4th and 5th. No significant movement of sesamoid bones.  Lateral view revealed protruding inferior calcaneal spur, posterior os trigonum separated, hypertrophic bone formation at the Talonavicular joint dorsal aspect, and elevated first ray.  No abnormal osseous fragments in ankle or Subtalar joint area of the left foot.   Assessment: Plantar fasciitis right heel. Arthropathy ankles bilateral, status post ankle injury left.  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 Elevated metatarsal bone with rearfoot pronation upon loading of forefoot.  . Plan: Reviewed findings and available treatment  options. Reviewed possible benefit from STJ arthroereisis on right to reduce excess ankle pronation and heel pain. As per discussion, injection given to left ankle at anterior lateral, just anterior to Fibular malleoli.  Injection consisted of a mixture of 4 mg Dexamethasone, 4 mg Triamcinolone, and 1 cc of 0.5% Marcaine plain. Patient tolerated well without difficulty.  As per request, pain medication prescribed with Percocet 10/325

## 2015-11-09 NOTE — Patient Instructions (Signed)
Seen for painful ankle. Cortisone injection given as pre request. Return as needed.  Filled out for medical necessity for AFO.

## 2015-11-16 DIAGNOSIS — F439 Reaction to severe stress, unspecified: Secondary | ICD-10-CM | POA: Insufficient documentation

## 2015-11-18 ENCOUNTER — Other Ambulatory Visit: Payer: Medicare Other

## 2015-11-24 ENCOUNTER — Other Ambulatory Visit: Payer: Medicare Other

## 2015-12-01 ENCOUNTER — Other Ambulatory Visit: Payer: Medicare Other

## 2015-12-03 ENCOUNTER — Ambulatory Visit (INDEPENDENT_AMBULATORY_CARE_PROVIDER_SITE_OTHER): Payer: Medicare Other | Admitting: Podiatry

## 2015-12-03 ENCOUNTER — Encounter: Payer: Self-pay | Admitting: Podiatry

## 2015-12-03 VITALS — BP 134/76 | HR 88

## 2015-12-03 DIAGNOSIS — M19072 Primary osteoarthritis, left ankle and foot: Secondary | ICD-10-CM

## 2015-12-03 DIAGNOSIS — M216X9 Other acquired deformities of unspecified foot: Secondary | ICD-10-CM

## 2015-12-03 DIAGNOSIS — M79673 Pain in unspecified foot: Secondary | ICD-10-CM

## 2015-12-03 DIAGNOSIS — M79604 Pain in right leg: Secondary | ICD-10-CM

## 2015-12-03 MED ORDER — OXYCODONE-ACETAMINOPHEN 10-325 MG PO TABS: 1.0000 | ORAL_TABLET | Freq: Three times a day (TID) | ORAL | Status: DC | PRN

## 2015-12-03 NOTE — Patient Instructions (Signed)
Seen for pain in left ankle. Injection given. Rx for pain given. Return as needed.

## 2015-12-03 NOTE — Progress Notes (Signed)
Subjective: 60 year old female presents complaining of pain in left lateral ankle and request for injection and pain medication.   Past history:  Achilles tendonitis several months.  Lost her mother Nov. 3, 2016.  Multiple injections in ankle joint with limited help.   Past surgical history reveal: 2011 injury to right ankle, broken ankle that was repaired with screw and plate in 5809. Hardware was taken out January 2016.  Sprained left ankle had no treatment other than pain pills.  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   Objective: Dermatologic:  Normal findings without any abnormal lesions. Neurologic: All epicritic and tactile sensations grossly intact bilateral. Vascular: All pedal pulses are palpable bilateral. Positive of mild edema on right ankle medial aspect with pain. Increased warmth on right ankle joint. Orthopedic: Elevated first ray with lateral weight shifting left > right. Severe pain with range of motion on both rearfoot.   Previous x-ray indicated: in AP view with mild digital contracture 4th and 5th. No significant movement of sesamoid bones.  Lateral view revealed protruding inferior calcaneal spur, posterior os trigonum separated, hypertrophic bone formation at the Talonavicular joint dorsal aspect, and elevated first ray.  No abnormal osseous fragments in ankle or Subtalar joint area of the left foot.   Assessment: Arthropathy ankles bilateral, status post ankle injury left.  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 Elevated metatarsal bone with rearfoot pronation upon loading of forefoot.  . Plan: Reviewed findings and available treatment options. Reviewed possible benefit from STJ arthroereisis on right to reduce excess ankle pronation and heel pain. As per discussion, injection given to left ankle at anterior lateral, just  anterior to Fibular malleoli.  Injection consisted of a mixture of 4 mg Dexamethasone, 4 mg Triamcinolone, and 1 cc of 0.5% Marcaine plain. Patient tolerated well without difficulty.  As per request, pain medication prescribed with Percocet 10/325

## 2015-12-06 ENCOUNTER — Other Ambulatory Visit: Payer: Medicare Other

## 2015-12-08 ENCOUNTER — Ambulatory Visit
Admission: RE | Admit: 2015-12-08 | Discharge: 2015-12-08 | Disposition: A | Discharge status: Home / Self Care | Payer: Medicare Other | Source: Ambulatory Visit | Attending: Physician Assistant | Admitting: Physician Assistant

## 2015-12-08 DIAGNOSIS — R928 Other abnormal and inconclusive findings on diagnostic imaging of breast: Secondary | ICD-10-CM

## 2015-12-08 DIAGNOSIS — N6310 Unspecified lump in the right breast, unspecified quadrant: Secondary | ICD-10-CM

## 2016-01-06 ENCOUNTER — Ambulatory Visit (INDEPENDENT_AMBULATORY_CARE_PROVIDER_SITE_OTHER): Payer: Medicare Other | Admitting: Podiatry

## 2016-01-06 ENCOUNTER — Encounter: Payer: Self-pay | Admitting: Podiatry

## 2016-01-06 VITALS — BP 140/79 | HR 92

## 2016-01-06 DIAGNOSIS — M216X9 Other acquired deformities of unspecified foot: Secondary | ICD-10-CM

## 2016-01-06 DIAGNOSIS — M6588 Other synovitis and tenosynovitis, other site: Secondary | ICD-10-CM | POA: Diagnosis not present

## 2016-01-06 DIAGNOSIS — M19072 Primary osteoarthritis, left ankle and foot: Secondary | ICD-10-CM

## 2016-01-06 DIAGNOSIS — M659 Synovitis and tenosynovitis, unspecified: Secondary | ICD-10-CM

## 2016-01-06 DIAGNOSIS — M79604 Pain in right leg: Secondary | ICD-10-CM | POA: Diagnosis not present

## 2016-01-06 MED ORDER — OXYCODONE-ACETAMINOPHEN 10-325 MG PO TABS: 1.0000 | ORAL_TABLET | Freq: Three times a day (TID) | ORAL | 0 refills | Status: DC | PRN

## 2016-01-06 NOTE — Progress Notes (Signed)
Subjective: 60 year old female presents complaining of pain in left lateral ankle and request for pain medication.  Been wearing AFO brace dispensed by 3rd party. They are helping but coming apart.  Past history:  Achilles tendonitis several months.  Lost her mother Nov. 3, 2016.  Multiple injections in ankle joint with limited help.   Past surgical history reveal: 2011 injury to right ankle, broken ankle that was repaired with screw and plate in 3785. Hardware was taken out January 2016.  Sprained left ankle had no treatment other than pain pills.  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   Objective: Dermatologic:  Normal findings without any abnormal lesions. Neurologic: All epicritic and tactile sensations grossly intact bilateral. Vascular: All pedal pulses are palpable bilateral. Positive of mild edema on right ankle medial aspect with pain. Increased warmth on right ankle joint. Orthopedic: Elevated first ray with lateral weight shifting left > right. Severe pain with range of motion on both rearfoot.   Previous x-ray indicated: in AP view with mild digital contracture 4th and 5th. No significant movement of sesamoid bones.  Lateral view revealed protruding inferior calcaneal spur, posterior os trigonum separated, hypertrophic bone formation at the Talonavicular joint dorsal aspect, and elevated first ray.  No abnormal osseous fragments in ankle or Subtalar joint area of the left foot.   Assessment: Arthropathy ankles bilateral, status post ankle injury left.  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 Elevated metatarsal bone with rearfoot pronation upon loading of forefoot.  . Plan: Reviewed findings and available treatment options. As per request, pain medication prescribed with Percocet 10/325

## 2016-01-06 NOTE — Patient Instructions (Signed)
Seen for pain in left foot. Pain medication prescribed.

## 2016-01-18 ENCOUNTER — Encounter: Payer: Self-pay | Admitting: *Deleted

## 2016-01-19 ENCOUNTER — Encounter: Payer: Self-pay | Admitting: Emergency Medicine

## 2016-01-19 ENCOUNTER — Ambulatory Visit (INDEPENDENT_AMBULATORY_CARE_PROVIDER_SITE_OTHER): Payer: Medicare Other | Admitting: Emergency Medicine

## 2016-01-19 VITALS — BP 100/66 | HR 80 | Ht 63.0 in | Wt 185.0 lb

## 2016-01-19 DIAGNOSIS — J449 Chronic obstructive pulmonary disease, unspecified: Secondary | ICD-10-CM

## 2016-01-19 DIAGNOSIS — F17210 Nicotine dependence, cigarettes, uncomplicated: Secondary | ICD-10-CM

## 2016-01-19 DIAGNOSIS — H01131 Eczematous dermatitis of right upper eyelid: Secondary | ICD-10-CM | POA: Insufficient documentation

## 2016-01-19 DIAGNOSIS — H01134 Eczematous dermatitis of left upper eyelid: Secondary | ICD-10-CM | POA: Insufficient documentation

## 2016-01-19 DIAGNOSIS — Z720 Tobacco use: Secondary | ICD-10-CM | POA: Diagnosis not present

## 2016-01-19 DIAGNOSIS — Z122 Encounter for screening for malignant neoplasm of respiratory organs: Secondary | ICD-10-CM

## 2016-01-19 DIAGNOSIS — Z87891 Personal history of nicotine dependence: Secondary | ICD-10-CM | POA: Diagnosis not present

## 2016-01-19 DIAGNOSIS — H52223 Regular astigmatism, bilateral: Secondary | ICD-10-CM | POA: Insufficient documentation

## 2016-01-19 MED ORDER — ALBUTEROL SULFATE HFA 108 (90 BASE) MCG/ACT IN AERS: 2.0000 | INHALATION_SPRAY | Freq: Four times a day (QID) | RESPIRATORY_TRACT | 2 refills | Status: DC | PRN

## 2016-01-19 NOTE — Progress Notes (Signed)
Subjective:    Patient ID: Rose [name], female    DOB: 1955-10-01, 60 y.o.   MRN: [id number]  HPI 60 year old woman with history of tobacco use (20-30 pk-yrs), Now using vapor. She carries a diagnosis of COPD and has been seen in our office before by Dr Sherene Sires. She had spirometry 03/24/15 That suggested moderate airflow obstruction. She also carries a history of diabetes, hyperlipidemia. She returns today with dyspnea and chest discomfort that she describes as. She is concerned about her potential risk for lung cancer given her tobacco history in her family history. She is not currently on any bronchodilators. She has a lot of cough, occasionally productive of brown. She has chest pain, mid chest, happens daily and responds to NTG. She is on omeprazole. She had cardiac stress testing at [facility] cardiology last year, she is not sure of the results. She has been on inhaled medications before from hospital and her PCP - she isn';t sure which ones. She now has ventolin that she uses prn, about 2x a day.    Review of Systems  Constitutional: Positive for appetite change and unexpected weight change.  HENT: Negative for congestion, postnasal drip and rhinorrhea.   Respiratory: Positive for cough and shortness of breath. Negative for wheezing and stridor.   Cardiovascular: Positive for chest pain. Negative for leg swelling.  Gastrointestinal: Positive for abdominal pain.  Psychiatric/Behavioral: The patient is nervous/anxious.    Past Medical History:  Diagnosis Date  . Allergy   . Asthma   . Chronic headaches   . Diabetes (HCC)   . High cholesterol   . History of blood clots   . Stroke [facility])      Family History  Problem Relation Age of Onset  . Emphysema Mother   . Heart disease Mother   . Cancer Mother   . Heart disease Father      Social History   Social History  . Marital status: Single    Spouse name: N/A  . Number of children: N/A  . Years of education: N/A   Occupational  History  . Not on file.   Social History Main Topics  . Smoking status: Current Every Day Smoker    Packs/day: 0.50    Years: 30.00    Types: Cigarettes, E-cigarettes  . Smokeless tobacco: Never Used  . Alcohol use Not on file  . Drug use: Unknown  . Sexual activity: Not on file   Other Topics Concern  . Not on file   Social History Narrative  . No narrative on file     Allergies  Allergen Reactions  . Nickel Rash and Shortness Of Breath  . Oxycodone Shortness Of Breath  . Codeine Swelling  . Hydrocodone-Acetaminophen Itching  . Morphine Itching  . Propoxyphene Other (See Comments)  . Quetiapine     Other reaction(s): Other (See Comments) Restless leg syndrome     Outpatient Medications Prior to Visit  Medication Sig Dispense Refill  . aspirin EC 81 MG tablet Take 81 mg by mouth.    . clonazePAM (KLONOPIN) 1 MG tablet Take 1 mg by mouth.    . cyclobenzaprine (FLEXERIL) 10 MG tablet TK 1 T PO TID PRF MUSCLE SPASM  0  . diphenhydrAMINE (BENADRYL) 25 MG tablet Take 2 tablets (50 mg total) by mouth every 8 (eight) hours as needed. 60 tablet 0  . docusate sodium (COLACE) 100 MG capsule Take 100 mg by mouth.    . DULoxetine (CYMBALTA) 60 MG capsule Take  1 capsule by mouth 2 (two) times daily.    Marland Kitchen estradiol (ESTRACE) 0.5 MG tablet TK 1 T PO D  1  . gabapentin (NEURONTIN) 600 MG tablet TK 1 T PO TID  0  . meloxicam (MOBIC) 15 MG tablet TK 1 T PO D  0  . metFORMIN (GLUCOPHAGE-XR) 500 MG 24 hr tablet Take 2 tablets by mouth daily.    . nitroGLYCERIN (NITROSTAT) 0.4 MG SL tablet Place 0.4 mg under the tongue every 5 (five) minutes as needed for chest pain.    Marland Kitchen nystatin-triamcinolone (MYCOLOG II) cream Apply 1 application topically 2 (two) times daily.    Marland Kitchen omega-3 acid ethyl esters (LOVAZA) 1 G capsule TK 2 CS PO BID  6  . omeprazole (PRILOSEC) 20 MG capsule TK ONE C PO BID  6  . oxyCODONE-acetaminophen (PERCOCET) 10-325 MG tablet Take 1 tablet by mouth every 8 (eight) hours  as needed for pain. 60 tablet 0  . traZODone (DESYREL) 100 MG tablet TK 2 TS PO QHS  1   Facility-Administered Medications Prior to Visit  Medication Dose Route Frequency Provider Last Rate Last Dose  . triamcinolone acetonide (KENALOG) 10 MG/ML injection 10 mg  10 mg Other Once [employer], DPM            Objective:   Physical Exam Vitals:   01/19/16 1455 01/19/16 1457  BP:  100/66  Pulse:  80  SpO2:  95%  Weight: 185 lb (83.9 kg)   Height: 5\' 3"  (1.6 m)    Gen: Pleasant, overwt, in no distress,  normal affect  ENT: No lesions,  mouth clear,  oropharynx clear, no postnasal drip, very deep voice  Neck: No JVD, no TMG, no carotid bruits  Lungs: No use of accessory muscles, clear without rales or rhonchi, No wheeze on forced expiration  Cardiovascular: RRR, heart sounds normal, no murmur or gallops, no peripheral edema  Musculoskeletal: No deformities, no cyanosis or clubbing  Neuro: alert, non focal  Skin: Warm, no lesions or rashes   03/24/15 --  COMPARISON:  None.  FINDINGS: The heart size and mediastinal contours are within normal limits. Both lungs are clear. No evidence of pleural effusion. Mild hyperinflation is suspicious for COPD. The visualized skeletal structures are unremarkable.  IMPRESSION: No active cardiopulmonary disease.      Assessment & Plan:  Cigarette smoker Patient states that she smokes just under a pack of cigarettes for approximately 30 years. I suspect that her total exposure is at or above 30 pack years. She is right on the border of the targeted group for lung cancer screening. Also note that she continues to use vapor. With a positive family history and approximate 30-pack-year exposure think it would be appropriate to refer her for low-dose CT scan screening.  COPD  GOLD II Continue Ventolin when necessary for now. I suspect she needs to be on scheduled bronchodilators. She's been started on other inhaled medicine before but  she can't recall the names. I will perform full PFT and consider starting scheduled medication at her next visit. Continue Ventolin for now.   03/26/15, MD, PhD 01/19/2016, 3:23 PM [facility] [phone] or if no answer [phone]

## 2016-01-19 NOTE — Assessment & Plan Note (Signed)
Continue Ventolin when necessary for now. I suspect she needs to be on scheduled bronchodilators. She's been started on other inhaled medicine before but she can't recall the names. I will perform full PFT and consider starting scheduled medication at her next visit. Continue Ventolin for now.

## 2016-01-19 NOTE — Patient Instructions (Signed)
We will refer you to our lung cancer screening Coordinator to discuss the program and to set up CT scan of her chest Please continue to use Ventolin 2 puffs as needed for shortness of breath We will arrange for full pulmonary function testing Continue to work on decreasing and hopefully stopping vapor use Follow with Dr Delton Coombes next available with PFT on the same day.

## 2016-01-19 NOTE — Assessment & Plan Note (Signed)
Patient states that she smokes just under a pack of cigarettes for approximately 30 years. I suspect that her total exposure is at or above 30 pack years. She is right on the border of the targeted group for lung cancer screening. Also note that she continues to use vapor. With a positive family history and approximate 30-pack-year exposure think it would be appropriate to refer her for low-dose CT scan screening.

## 2016-01-20 ENCOUNTER — Other Ambulatory Visit: Payer: Self-pay | Admitting: Acute Care

## 2016-01-20 DIAGNOSIS — Z87891 Personal history of nicotine dependence: Secondary | ICD-10-CM

## 2016-02-09 ENCOUNTER — Encounter: Payer: Self-pay | Admitting: Podiatry

## 2016-02-09 ENCOUNTER — Ambulatory Visit (INDEPENDENT_AMBULATORY_CARE_PROVIDER_SITE_OTHER): Payer: Medicare Other | Admitting: Podiatry

## 2016-02-09 VITALS — BP 132/84 | HR 95

## 2016-02-09 DIAGNOSIS — M659 Synovitis and tenosynovitis, unspecified: Secondary | ICD-10-CM

## 2016-02-09 DIAGNOSIS — M6588 Other synovitis and tenosynovitis, other site: Secondary | ICD-10-CM

## 2016-02-09 DIAGNOSIS — M79605 Pain in left leg: Secondary | ICD-10-CM

## 2016-02-09 DIAGNOSIS — M25572 Pain in left ankle and joints of left foot: Secondary | ICD-10-CM | POA: Diagnosis not present

## 2016-02-09 MED ORDER — OXYCODONE-ACETAMINOPHEN 10-325 MG PO TABS: 1.0000 | ORAL_TABLET | Freq: Three times a day (TID) | ORAL | 0 refills | Status: DC | PRN

## 2016-02-09 NOTE — Progress Notes (Signed)
Subjective: 60 year old female presents complaining of pain in left lateral ankle and request for pain medication.  Patient points left lateral sinus tarsi area just anterior distal to lateral malleoli area being the source of pain.  Not wearing brace. They rubs her skin.  Patient request pain medication refilled. Will pass injection for now.   Past history:  Achilles tendonitis several months.  Lost her mother Nov. 3, 2016.  Multiple injections in ankle joint with limited help.   Past surgical history reveal: 2011 injury to right ankle, broken ankle that was repaired with screw and plate in 7591. Hardware was taken out January 2016.  Sprained left ankle had no treatment other than pain pills.  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   Objective: Dermatologic:  Normal findings without any abnormal lesions. Neurologic: All epicritic and tactile sensations grossly intact bilateral. Vascular: All pedal pulses are palpable bilateral. No visible edema noted on left ankle. Orthopedic: Elevated first ray with lateral weight shifting left >right. Severe pain with range of motion on both rearfoot.   Previous x-ray indicated:in AP view with mild digital contracture 4th and 5th. No significant movement of sesamoid bones.  Lateral view revealed protruding inferior calcaneal spur, posterior os trigonum separated, hypertrophic bone formation at the Talonavicular joint dorsal aspect, and elevated first ray.  No abnormal osseous fragments in ankle or Subtalar joint area of the left foot.   Assessment: Arthropathy ankles bilateral, status post ankle injury left.  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 Elevated metatarsal bone with rearfoot pronation upon loading of forefoot.  . Plan: Reviewed findings and available treatment options. As per request, pain  medication prescribed with Percocet 10/325

## 2016-02-09 NOTE — Patient Instructions (Signed)
Seen for pain in left ankle. Reviewed findings and pain medication refilled as per request. Return as needed.

## 2016-03-01 ENCOUNTER — Encounter: Payer: Self-pay | Admitting: Podiatry

## 2016-03-01 ENCOUNTER — Ambulatory Visit (INDEPENDENT_AMBULATORY_CARE_PROVIDER_SITE_OTHER): Payer: Medicare Other | Admitting: Podiatry

## 2016-03-01 VITALS — BP 132/79 | HR 97

## 2016-03-01 DIAGNOSIS — M659 Synovitis and tenosynovitis, unspecified: Secondary | ICD-10-CM | POA: Diagnosis not present

## 2016-03-01 DIAGNOSIS — M79605 Pain in left leg: Secondary | ICD-10-CM | POA: Diagnosis not present

## 2016-03-01 DIAGNOSIS — M19072 Primary osteoarthritis, left ankle and foot: Secondary | ICD-10-CM | POA: Diagnosis not present

## 2016-03-01 MED ORDER — OXYCODONE-ACETAMINOPHEN 10-325 MG PO TABS: 1.0000 | ORAL_TABLET | Freq: Three times a day (TID) | ORAL | 0 refills | Status: DC | PRN

## 2016-03-01 NOTE — Progress Notes (Signed)
Subjective: 60 year old female presents complaining of pain in left lateral ankle and request for an injection.  Been hurting for 3 weeks.  Patient points posterior inferior to the lateral malleoli of left ankle being the source of pain. Walks with a cane and uses ankle brace.  Patient also having pain over shoulder and back of neck for over a year and is planning to see orthopedic doctor.  Past surgical history reveal: 2011 injury to right ankle, broken ankle that was repaired with screw and plate in 2094. Hardware was taken out January 2016.  Sprained left ankle 2011 with unresolved ankle pain.  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   Objective: Dermatologic:  Normal findings without any abnormal lesions. Neurologic: All epicritic and tactile sensations grossly intact bilateral. Vascular: All pedal pulses are palpable bilateral.  No acute edema or increased temperature noted. Orthopedic: Elevated first ray with lateral weight shifting left >right.  Severe pain with range of motion on both subtalar and ankle joint motion bilateral. .   Assessment: Chronic foot and ankle pain with a. S/P Sprained left ankle 2011.  Severe ankle pronation bilateral. Elevated metatarsal bone with rearfoot pronation upon loading of forefoot.  . Plan: Reviewed findings and available treatment options. As per request, left posterolateral ankle joint Injected with mixture of 4 mg Dexamethasone, 4 mg Triamcinolone, and 1 cc of 0.5% Marcaine plain. Patient tolerated well without difficulty.  As per request, pain medication prescribed with Percocet 10/325. Return as needed.

## 2016-03-01 NOTE — Patient Instructions (Signed)
Seen for pain in left ankle. As per request cortisone injection given and pain medication refilled. Return as needed.

## 2016-03-02 ENCOUNTER — Telehealth: Payer: Self-pay | Admitting: Acute Care

## 2016-03-02 DIAGNOSIS — Z87891 Personal history of nicotine dependence: Secondary | ICD-10-CM

## 2016-03-03 NOTE — Telephone Encounter (Signed)
Routing to lung nodule pool.  

## 2016-03-06 ENCOUNTER — Encounter: Payer: Medicare Other | Admitting: Acute Care

## 2016-03-06 ENCOUNTER — Inpatient Hospital Stay: Admission: RE | Admit: 2016-03-06 | Payer: Medicare Other | Source: Ambulatory Visit

## 2016-03-08 NOTE — Telephone Encounter (Signed)
LVM for pt to return call

## 2016-03-17 ENCOUNTER — Ambulatory Visit: Payer: Medicare Other | Admitting: Emergency Medicine

## 2016-03-24 DIAGNOSIS — F988 Other specified behavioral and emotional disorders with onset usually occurring in childhood and adolescence: Secondary | ICD-10-CM | POA: Insufficient documentation

## 2016-03-24 DIAGNOSIS — F32A Depression, unspecified: Secondary | ICD-10-CM | POA: Insufficient documentation

## 2016-03-24 DIAGNOSIS — R413 Other amnesia: Secondary | ICD-10-CM | POA: Insufficient documentation

## 2016-03-24 NOTE — Telephone Encounter (Signed)
Patient called back to r/s appt - She can be reached at [phone] -pr

## 2016-03-28 ENCOUNTER — Ambulatory Visit: Payer: Medicare Other | Admitting: Podiatry

## 2016-04-05 ENCOUNTER — Ambulatory Visit (INDEPENDENT_AMBULATORY_CARE_PROVIDER_SITE_OTHER): Payer: Medicare Other | Admitting: Podiatry

## 2016-04-05 ENCOUNTER — Encounter: Payer: Self-pay | Admitting: Podiatry

## 2016-04-05 DIAGNOSIS — M19072 Primary osteoarthritis, left ankle and foot: Secondary | ICD-10-CM | POA: Diagnosis not present

## 2016-04-05 DIAGNOSIS — M659 Synovitis and tenosynovitis, unspecified: Secondary | ICD-10-CM | POA: Diagnosis not present

## 2016-04-05 MED ORDER — PROMETHAZINE HCL 12.5 MG PO TABS: 12.5000 mg | ORAL_TABLET | Freq: Three times a day (TID) | ORAL | 2 refills | Status: DC | PRN

## 2016-04-05 MED ORDER — OXYCODONE-ACETAMINOPHEN 10-325 MG PO TABS: 1.0000 | ORAL_TABLET | Freq: Three times a day (TID) | ORAL | 0 refills | Status: DC | PRN

## 2016-04-05 NOTE — Progress Notes (Signed)
Subjective: 60 year old female presents complaining of pain in both feet and request for injection and Rx for pain medication. This has been on going problem for the last 2-3 years off and on.   Patient points posterior inferior to the lateral malleoli of both ankles being the source of pain. Walks with a cane and uses ankle brace.   Past surgical history reveal: 2011 injury to right ankle, broken ankle that was repaired with screw and plate in 1027. Hardware was taken out January 2016.  Sprained left ankle 2011 with unresolved ankle pain.  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   Objective: No abnormal skin lesions on both feet. Neurologic: All epicritic and tactile sensations grossly intact bilateral. Vascular: All pedal pulses are palpable bilateral.  No acute edema, erythema, or increased temperature noted. Orthopedic: Elevated first ray with lateral weight shifting left >right.  Severe pain with range of motion on both subtalar and ankle joint motion bilateral. .   Assessment: Chronic foot and ankle pain bilateral. S/P Sprained left ankle 2011 Severe ankle pronation bilateral. Elevated metatarsal bone with rearfoot pronation upon loading of forefoot bilateral.  . Plan: Reviewed findings and available treatment options. Advised to wait a while before getting another injection.  As per request, pain medication prescribed with Percocet 10/325. Return as needed.

## 2016-04-05 NOTE — Patient Instructions (Addendum)
Seen for pain in both feet.  Had injection last month. Will wait another month for next injection.  Pain medication prescribed. Return as needed.

## 2016-04-06 ENCOUNTER — Telehealth: Payer: Self-pay | Admitting: Acute Care

## 2016-04-14 NOTE — Telephone Encounter (Signed)
Routed to lung nodule pool °

## 2016-04-21 NOTE — Telephone Encounter (Signed)
See other phone note on this pt.

## 2016-04-28 NOTE — Telephone Encounter (Signed)
Called and spoke with pt. Informed her of the wait time for lung cancer screening. Pt verbalized understanding.

## 2016-05-03 ENCOUNTER — Ambulatory Visit (INDEPENDENT_AMBULATORY_CARE_PROVIDER_SITE_OTHER): Payer: Medicare Other | Admitting: Podiatry

## 2016-05-03 ENCOUNTER — Encounter: Payer: Self-pay | Admitting: Podiatry

## 2016-05-03 VITALS — BP 147/87 | HR 95

## 2016-05-03 DIAGNOSIS — M79604 Pain in right leg: Secondary | ICD-10-CM | POA: Diagnosis not present

## 2016-05-03 DIAGNOSIS — M659 Synovitis and tenosynovitis, unspecified: Secondary | ICD-10-CM

## 2016-05-03 DIAGNOSIS — M216X9 Other acquired deformities of unspecified foot: Secondary | ICD-10-CM

## 2016-05-03 DIAGNOSIS — M65979 Unspecified synovitis and tenosynovitis, unspecified ankle and foot: Secondary | ICD-10-CM

## 2016-05-03 MED ORDER — OXYCODONE-ACETAMINOPHEN 10-325 MG PO TABS: 1.0000 | ORAL_TABLET | Freq: Three times a day (TID) | ORAL | 0 refills | Status: DC | PRN

## 2016-05-03 NOTE — Patient Instructions (Addendum)
Pain in right ankle from turning in. Reviewed findings and possible surgical option, Subtalar joint Arthroereisis, implant surgery right foot. If this procedure fails to improve to satisfactory level, it may require further procedure, Lapidus fusion of the first Metatarsocuneiform joint right foot. Surgery consent form reviewed.

## 2016-05-03 NOTE — Progress Notes (Signed)
Pain in right ankle that swells and bulging out.  Subjective: 60 year old female presents complaining of pain and swelling in right ankle.  This has been on going problem for the last 2-3 years off and on.   Stated that her ankle brace was sent out for repair.   Past surgical history reveal: 2011 injury to right ankle, broken ankle that was repaired with screw and plate in 7121. Hardware was taken out January 2016.  Sprained left ankle 2011 with unresolved ankle pain.  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   Objective: No abnormal skin lesions on both feet. Neurologic: All epicritic and tactile sensations grossly intact bilateral. Vascular: Edema on right medial ankle with pain. Orthopedic: Elevated first ray with lateral weight shifting left >right.  Excess STJ and ankle joint pronation with weight bearing right. Severe pain with range of motion on both subtalar and ankle joint motion bilateral. .   Radiographic examination reveal excess first ray elevation in lateral view. Anterior break in CYMA line right foot.   Assessment: Chronic foot and ankle pain bilateral. S/P Sprained left ankle 2011 Severe ankle and STJ pronation bilateral R>L. Elevated metatarsal bone with rearfoot pronation upon loading of forefoot bilateral.  . Plan: Reviewed findings and available treatment options.  Reviewed Possible STJ Arthroereisis on right foot. May need further procedure, Lapidus fusion if pain persist.  Surgery consent form reviewed for STJ arthroereisis right foot.  As per request, pain medication prescribed with Percocet 10/325.

## 2016-05-11 DIAGNOSIS — R1084 Generalized abdominal pain: Secondary | ICD-10-CM | POA: Insufficient documentation

## 2016-05-11 DIAGNOSIS — K219 Gastro-esophageal reflux disease without esophagitis: Secondary | ICD-10-CM | POA: Insufficient documentation

## 2016-05-11 DIAGNOSIS — K921 Melena: Secondary | ICD-10-CM | POA: Insufficient documentation

## 2016-05-11 DIAGNOSIS — R110 Nausea: Secondary | ICD-10-CM | POA: Insufficient documentation

## 2016-05-11 DIAGNOSIS — Z86010 Personal history of colonic polyps: Secondary | ICD-10-CM | POA: Insufficient documentation

## 2016-05-15 ENCOUNTER — Encounter: Payer: Self-pay | Admitting: Emergency Medicine

## 2016-05-15 ENCOUNTER — Ambulatory Visit (INDEPENDENT_AMBULATORY_CARE_PROVIDER_SITE_OTHER): Payer: Medicare Other | Admitting: Emergency Medicine

## 2016-05-15 VITALS — BP 110/70 | HR 95 | Ht 63.0 in | Wt 186.0 lb

## 2016-05-15 DIAGNOSIS — J449 Chronic obstructive pulmonary disease, unspecified: Secondary | ICD-10-CM

## 2016-05-15 DIAGNOSIS — F17210 Nicotine dependence, cigarettes, uncomplicated: Secondary | ICD-10-CM

## 2016-05-15 LAB — PULMONARY FUNCTION TEST
DL/VA % pred: 60 %
DL/VA: 2.88 ml/min/mmHg/L
DLCO cor % pred: 65 %
DLCO cor: 15.99 ml/min/mmHg
DLCO unc % pred: 65 %
DLCO unc: 15.99 ml/min/mmHg
FEF 25-75 Post: 1.84 L/sec
FEF 25-75 Pre: 1.61 L/sec
FEF2575-%Change-Post: 14 %
FEF2575-%Pred-Post: 77 %
FEF2575-%Pred-Pre: 68 %
FEV1-%Change-Post: 5 %
FEV1-%Pred-Post: 99 %
FEV1-%Pred-Pre: 94 %
FEV1-Post: 2.55 L
FEV1-Pre: 2.42 L
FEV1FVC-%Change-Post: 2 %
FEV1FVC-%Pred-Pre: 88 %
FEV6-%Change-Post: 1 %
FEV6-%Pred-Post: 111 %
FEV6-%Pred-Pre: 109 %
FEV6-Post: 3.57 L
FEV6-Pre: 3.5 L
FEV6FVC-%Change-Post: 0 %
FEV6FVC-%Pred-Post: 101 %
FEV6FVC-%Pred-Pre: 102 %
FVC-%Change-Post: 2 %
FVC-%Pred-Post: 109 %
FVC-%Pred-Pre: 106 %
FVC-Post: 3.62 L
FVC-Pre: 3.52 L
Post FEV1/FVC ratio: 70 %
Post FEV6/FVC ratio: 98 %
Pre FEV1/FVC ratio: 69 %
Pre FEV6/FVC Ratio: 99 %
RV % pred: 127 %
RV: 2.53 L
TLC % pred: 119 %
TLC: 6.06 L

## 2016-05-15 NOTE — Patient Instructions (Addendum)
Please continue to have albuterol available to use 2 puffs as needed for shortness of breath.  We will hold off on starting a daily inhaler at this time.  The most important thing for [place] to do will be to stop smoking. We will make a plan to work on this more when your anxiety is improving.  We will refer you to the lung cancer screening program.  Follow with Dr Delton Coombes in 6 months or sooner if you have any problems

## 2016-05-15 NOTE — Progress Notes (Signed)
Subjective:    Patient ID: Rose [name], female    DOB: Apr 09, 1956, 60 y.o.   MRN: [id number]  HPI 60 year old woman with history of tobacco use (20-30 pk-yrs), Now using vapor. She carries a diagnosis of COPD and has been seen in our office before by Dr Sherene Sires. She had spirometry 03/24/15 That suggested moderate airflow obstruction. She also carries a history of diabetes, hyperlipidemia. She returns today with dyspnea and chest discomfort that she describes as. She is concerned about her potential risk for lung cancer given her tobacco history in her family history. She is not currently on any bronchodilators. She has a lot of cough, occasionally productive of brown. She has chest pain, mid chest, happens daily and responds to NTG. She is on omeprazole. She had cardiac stress testing at [facility] cardiology last year, she is not sure of the results. She has been on inhaled medications before from hospital and her PCP - she isn';t sure which ones. She now has ventolin that she uses prn, about 2x a day.   ROV 05/15/16 -- This follow-up visit for patient with a history use and presumed COPD. She underwent pulmonary function testing today that I have personally reviewed. This showed mild obstruction without a bronchodilator response, normal volumes and a decreased diffusion capacity that did not correct into the normal range when adjusted for alveolar volume. She is currently managed on albuterol as needed. Tells me that she uses it about 2x a week. Continues to smoke about . She has quit before. She is having some back pain.    Review of Systems  Constitutional: Positive for appetite change and unexpected weight change.  HENT: Negative for congestion, postnasal drip and rhinorrhea.   Respiratory: Positive for cough and shortness of breath. Negative for wheezing and stridor.   Cardiovascular: Positive for chest pain. Negative for leg swelling.  Gastrointestinal: Positive for abdominal pain.    Psychiatric/Behavioral: The patient is nervous/anxious.     Past Medical History:  Diagnosis Date  . Allergy   . Asthma   . Chronic headaches   . Diabetes (HCC)   . High cholesterol   . History of blood clots   . Stroke [facility])      Family History  Problem Relation Age of Onset  . Emphysema Mother   . Heart disease Mother   . Cancer Mother   . Heart disease Father      Social History   Social History  . Marital status: Single    Spouse name: N/A  . Number of children: N/A  . Years of education: N/A   Occupational History  . Not on file.   Social History Main Topics  . Smoking status: Current Every Day Smoker    Packs/day: 1.00    Years: 30.00    Types: Cigarettes, E-cigarettes  . Smokeless tobacco: Never Used     Comment: Heavy secondhand smoke exposure  . Alcohol use Not on file  . Drug use: Unknown  . Sexual activity: Not on file   Other Topics Concern  . Not on file   Social History Narrative  . No narrative on file     Allergies  Allergen Reactions  . Nickel Rash and Shortness Of Breath  . Oxycodone Shortness Of Breath  . Codeine Swelling  . Hydrocodone-Acetaminophen Itching  . Morphine Itching  . Propoxyphene Other (See Comments)  . Quetiapine     Other reaction(s): Other (See Comments) Restless leg syndrome     Outpatient Medications  Prior to Visit  Medication Sig Dispense Refill  . albuterol (PROVENTIL HFA;VENTOLIN HFA) 108 (90 Base) MCG/ACT inhaler Inhale 2 puffs into the lungs every 6 (six) hours as needed for wheezing or shortness of breath. 1 Inhaler 2  . aspirin EC 81 MG tablet Take 81 mg by mouth.    . clonazePAM (KLONOPIN) 1 MG tablet Take 1 mg by mouth.    . DULoxetine (CYMBALTA) 60 MG capsule Take 1 capsule by mouth 2 (two) times daily.    Marland Kitchen estradiol (ESTRACE) 0.5 MG tablet TK 1 T PO D  1  . gabapentin (NEURONTIN) 600 MG tablet TK 1 T PO TID  0  . metFORMIN (GLUCOPHAGE-XR) 500 MG 24 hr tablet Take 2 tablets by mouth daily.    .  nitroGLYCERIN (NITROSTAT) 0.4 MG SL tablet Place 0.4 mg under the tongue every 5 (five) minutes as needed for chest pain.    Marland Kitchen nystatin-triamcinolone (MYCOLOG II) cream Apply 1 application topically 2 (two) times daily.    Marland Kitchen omega-3 acid ethyl esters (LOVAZA) 1 G capsule TK 2 CS PO BID  6  . omeprazole (PRILOSEC) 20 MG capsule TK ONE C PO BID  6  . oxyCODONE-acetaminophen (PERCOCET) 10-325 MG tablet Take 1 tablet by mouth every 8 (eight) hours as needed for pain. 60 tablet 0  . traZODone (DESYREL) 100 MG tablet TK 2 TS PO QHS  1  . cyclobenzaprine (FLEXERIL) 10 MG tablet TK 1 T PO TID PRF MUSCLE SPASM  0  . diphenhydrAMINE (BENADRYL) 25 MG tablet Take 2 tablets (50 mg total) by mouth every 8 (eight) hours as needed. (Patient not taking: Reported on 05/15/2016) 60 tablet 0  . docusate sodium (COLACE) 100 MG capsule Take 100 mg by mouth.    . meloxicam (MOBIC) 15 MG tablet TK 1 T PO D  0   Facility-Administered Medications Prior to Visit  Medication Dose Route Frequency Provider Last Rate Last Dose  . triamcinolone acetonide (KENALOG) 10 MG/ML injection 10 mg  10 mg Other Once [employer], DPM            Objective:   Physical Exam Vitals:   05/15/16 1123 05/15/16 1127  BP:  110/70  Pulse:  95  SpO2:  96%  Weight: 186 lb (84.4 kg)   Height: 5\' 3"  (1.6 m)    Gen: Pleasant, overwt, in no distress,  normal affect  ENT: No lesions,  mouth clear,  oropharynx clear, no postnasal drip, very deep voice  Neck: No JVD, no TMG, no carotid bruits  Lungs: No use of accessory muscles, clear without rales or rhonchi, No wheeze on forced expiration  Cardiovascular: RRR, heart sounds normal, no murmur or gallops, no peripheral edema  Musculoskeletal: No deformities, no cyanosis or clubbing  Neuro: alert, non focal  Skin: Warm, no lesions or rashes   03/24/15 --  COMPARISON:  None.  FINDINGS: The heart size and mediastinal contours are within normal limits. Both lungs are clear. No  evidence of pleural effusion. Mild hyperinflation is suspicious for COPD. The visualized skeletal structures are unremarkable.  IMPRESSION: No active cardiopulmonary disease.      Assessment & Plan:  COPD, mild Based on pulmonary function testing today. She is managing symptoms with albuterol when necessary. I don't think we need to start scheduled bronchodilators at this time. Most important thing is for her to stop smoking and we discussed this in detail today. She has multiple social stressors which are barriers right now. We  will revisit. His interested in  lung cancer screening and I'll refer her to the lung cancer screening program today.  Levy Pupa, MD, PhD 05/15/2016, 11:45 AM [facility] [phone] or if no answer [phone]

## 2016-05-15 NOTE — Addendum Note (Signed)
Addended by: Cydney Ok on: 05/15/2016 11:55 AM   Modules accepted: Orders

## 2016-05-15 NOTE — Assessment & Plan Note (Signed)
Based on pulmonary function testing today. She is managing symptoms with albuterol when necessary. I don't think we need to start scheduled bronchodilators at this time. Most important thing is for her to stop smoking and we discussed this in detail today. She has multiple social stressors which are barriers right now. We will revisit. His interested in  lung cancer screening and I'll refer her to the lung cancer screening program today.

## 2016-05-24 ENCOUNTER — Encounter: Payer: Self-pay | Admitting: Acute Care

## 2016-05-24 NOTE — Telephone Encounter (Signed)
Spoke with patient Scheduled SDMV Jun 05, 2016 CT order was placed Pt voiced understanding and had not further questions.

## 2016-05-31 ENCOUNTER — Encounter: Payer: Self-pay | Admitting: Podiatry

## 2016-05-31 ENCOUNTER — Ambulatory Visit (INDEPENDENT_AMBULATORY_CARE_PROVIDER_SITE_OTHER): Payer: Medicare Other | Admitting: Podiatry

## 2016-05-31 VITALS — BP 146/93 | HR 97

## 2016-05-31 DIAGNOSIS — M19072 Primary osteoarthritis, left ankle and foot: Secondary | ICD-10-CM | POA: Diagnosis not present

## 2016-05-31 DIAGNOSIS — M216X9 Other acquired deformities of unspecified foot: Secondary | ICD-10-CM | POA: Diagnosis not present

## 2016-05-31 DIAGNOSIS — M659 Synovitis and tenosynovitis, unspecified: Secondary | ICD-10-CM

## 2016-05-31 MED ORDER — OXYCODONE-ACETAMINOPHEN 10-325 MG PO TABS: 1.0000 | ORAL_TABLET | Freq: Three times a day (TID) | ORAL | 0 refills | Status: DC | PRN

## 2016-05-31 NOTE — Patient Instructions (Signed)
Pain in left ankle. Injection has not helped long enough to be satisfied. Pain medication prescribed.

## 2016-05-31 NOTE — Progress Notes (Signed)
Subjective: 61 year old female presents stating that she has left ankle pain today. Also having discomfort at the back of right Achilles tendon old surgery incision site following an injury.  Her last injection did not help but for a week.  This has been on going problem for the last 2-3 years off and on.  Patient is wearing winter boots.   Past surgical history reveal: 2011 injury to right ankle, broken ankle that was repaired with screw and plate in 4801. Hardware was taken out January 2016.  Sprained left ankle 2011 with unresolved ankle pain.  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   Objective: No changes since the last visit.  No abnormal skin lesions on both feet. Neurologic: All epicritic and tactile sensations grossly intact bilateral. Vascular: Edema on right medial ankle with pain. Orthopedic: Elevated first ray with lateral weight shifting left >right.  Stiff pronated STJ and ankle joint bilateral.  Severe pain with end range of motion on both subtalar and ankle joint motion bilateral.  Assessment: Arthropathy ankle joint bilateral.  S/P Sprained left ankle 2011 Severe ankle and STJ pronation bilateral R>L. Elevated metatarsal bone with rearfoot pronation upon loading of forefoot bilateral. . Plan: Reviewed findings and available treatment options.  Explained that if ankle pain becomes severe, it might require to have her ankle fusion done to stop the pain.

## 2016-06-05 ENCOUNTER — Telehealth: Payer: Self-pay | Admitting: Acute Care

## 2016-06-05 ENCOUNTER — Inpatient Hospital Stay: Admission: RE | Admit: 2016-06-05 | Payer: Medicare Other | Source: Ambulatory Visit

## 2016-06-05 ENCOUNTER — Encounter: Payer: Medicare Other | Admitting: Acute Care

## 2016-06-05 DIAGNOSIS — F17210 Nicotine dependence, cigarettes, uncomplicated: Secondary | ICD-10-CM

## 2016-06-05 NOTE — Telephone Encounter (Signed)
Will route to lung nodule pool. 

## 2016-06-23 NOTE — Telephone Encounter (Signed)
Called and spoke with pt and she is aware of SD[facility] appt with SG on 2/9 at 2 and her CT has been scheduled as well. Nothing further is needed.

## 2016-07-01 ENCOUNTER — Emergency Department (HOSPITAL_BASED_OUTPATIENT_CLINIC_OR_DEPARTMENT_OTHER): Payer: Medicare Other

## 2016-07-01 ENCOUNTER — Encounter (HOSPITAL_BASED_OUTPATIENT_CLINIC_OR_DEPARTMENT_OTHER): Payer: Self-pay | Admitting: Emergency Medicine

## 2016-07-01 ENCOUNTER — Emergency Department (HOSPITAL_BASED_OUTPATIENT_CLINIC_OR_DEPARTMENT_OTHER)
Admission: EM | Admit: 2016-07-01 | Discharge: 2016-07-01 | Disposition: A | Discharge status: Home / Self Care | Payer: Medicare Other | Attending: Emergency Medicine | Admitting: Emergency Medicine

## 2016-07-01 DIAGNOSIS — J45909 Unspecified asthma, uncomplicated: Secondary | ICD-10-CM | POA: Insufficient documentation

## 2016-07-01 DIAGNOSIS — F17210 Nicotine dependence, cigarettes, uncomplicated: Secondary | ICD-10-CM | POA: Insufficient documentation

## 2016-07-01 DIAGNOSIS — J449 Chronic obstructive pulmonary disease, unspecified: Secondary | ICD-10-CM | POA: Insufficient documentation

## 2016-07-01 DIAGNOSIS — H9201 Otalgia, right ear: Secondary | ICD-10-CM | POA: Insufficient documentation

## 2016-07-01 DIAGNOSIS — Z7984 Long term (current) use of oral hypoglycemic drugs: Secondary | ICD-10-CM | POA: Diagnosis not present

## 2016-07-01 DIAGNOSIS — R21 Rash and other nonspecific skin eruption: Secondary | ICD-10-CM | POA: Diagnosis not present

## 2016-07-01 DIAGNOSIS — E119 Type 2 diabetes mellitus without complications: Secondary | ICD-10-CM | POA: Insufficient documentation

## 2016-07-01 DIAGNOSIS — R1013 Epigastric pain: Secondary | ICD-10-CM | POA: Insufficient documentation

## 2016-07-01 DIAGNOSIS — Z7982 Long term (current) use of aspirin: Secondary | ICD-10-CM | POA: Diagnosis not present

## 2016-07-01 DIAGNOSIS — R110 Nausea: Secondary | ICD-10-CM | POA: Insufficient documentation

## 2016-07-01 LAB — CBC WITH DIFFERENTIAL/PLATELET
Basophils Absolute: 0 10*3/uL (ref 0.0–0.1)
Basophils Relative: 1 %
Eosinophils Absolute: 0.1 10*3/uL (ref 0.0–0.7)
Eosinophils Relative: 2 %
HCT: 36.9 % (ref 36.0–46.0)
Hemoglobin: 12.7 g/dL (ref 12.0–15.0)
Lymphocytes Relative: 28 %
Lymphs Abs: 1.5 10*3/uL (ref 0.7–4.0)
MCH: 28.6 pg (ref 26.0–34.0)
MCHC: 34.4 g/dL (ref 30.0–36.0)
MCV: 83.1 fL (ref 78.0–100.0)
Monocytes Absolute: 0.2 10*3/uL (ref 0.1–1.0)
Monocytes Relative: 4 %
Neutro Abs: 3.3 10*3/uL (ref 1.7–7.7)
Neutrophils Relative %: 65 %
Platelets: 199 10*3/uL (ref 150–400)
RBC: 4.44 MIL/uL (ref 3.87–5.11)
RDW: 14.1 % (ref 11.5–15.5)
WBC: 5.1 10*3/uL (ref 4.0–10.5)

## 2016-07-01 LAB — COMPREHENSIVE METABOLIC PANEL
ALT: 19 U/L (ref 14–54)
AST: 22 U/L (ref 15–41)
Albumin: 3.7 g/dL (ref 3.5–5.0)
Alkaline Phosphatase: 101 U/L (ref 38–126)
Anion gap: 7 (ref 5–15)
BUN: 8 mg/dL (ref 6–20)
CO2: 28 mmol/L (ref 22–32)
Calcium: 8.8 mg/dL — ABNORMAL LOW (ref 8.9–10.3)
Chloride: 102 mmol/L (ref 101–111)
Creatinine, Ser: 0.77 mg/dL (ref 0.44–1.00)
GFR calc Af Amer: >60 mL/min (ref >60)
GFR calc non Af Amer: >60 mL/min (ref >60)
Glucose, Bld: 99 mg/dL (ref 65–99)
Potassium: 3.7 mmol/L (ref 3.5–5.1)
Sodium: 137 mmol/L (ref 135–145)
Total Bilirubin: 0.4 mg/dL (ref 0.3–1.2)
Total Protein: 6.7 g/dL (ref 6.5–8.1)

## 2016-07-01 LAB — LIPASE, BLOOD: Lipase: 21 U/L (ref 11–51)

## 2016-07-01 LAB — TROPONIN I: Troponin I: <0.03 ng/mL (ref <0.03)

## 2016-07-01 MED ORDER — SODIUM CHLORIDE 0.9 % IV BOLUS (SEPSIS)
2000.0000 mL | Freq: Once | INTRAVENOUS | Status: AC
  Administered 2016-07-01: 2000 mL via INTRAVENOUS

## 2016-07-01 MED ORDER — METOCLOPRAMIDE HCL 5 MG/ML IJ SOLN
10.0000 mg | Freq: Once | INTRAMUSCULAR | Status: AC
  Administered 2016-07-01: 10 mg via INTRAVENOUS
  Filled 2016-07-01: qty 2

## 2016-07-01 NOTE — ED Triage Notes (Addendum)
Patient states that she was taken to [facility] last night via EMS - and left AMA  - the patient reports that she comes here because she has pancreatis  - patient also reports that she is dizzy and dehaydrated. The patient also wants something for itching because she has "itching" in the folds of her lower pelvic region.

## 2016-07-01 NOTE — Discharge Instructions (Signed)
Make sure that you drink at least six 8 ounce glasses of water or Gatorade each day in order to stay well-hydrated. Keep the area of reddened skin on your lower abdomen clean and dry Contact your primary care physician in 2 days to arrange to be seen in the office next week and to be reexamined. Ask him to go over all of your medications with you. Tell him that you would like to have your medications reduced

## 2016-07-01 NOTE — ED Notes (Signed)
Pt ambulated to BR without difficulty. Returned to bed and given warm blanket.

## 2016-07-01 NOTE — ED Notes (Signed)
ED Provider at bedside. 

## 2016-07-01 NOTE — ED Notes (Signed)
Pt given d/c instructions as per chart. Verbalizes understanding. No questions. 

## 2016-07-01 NOTE — ED Provider Notes (Addendum)
[facility] Provider Note   CSN: [id number] Arrival date & time: 07/01/16  1405   By signing my name below, I, Clarisse Gouge, attest that this documentation has been prepared under the direction and in the presence of Doug Sou, MD. Electronically signed, Clarisse Gouge, ED Scribe. 07/01/16. 3:21 PM.   History   Chief Complaint Chief Complaint  Patient presents with  . Dizziness   The history is provided by the patient and medical records. No language interpreter was used.    HPI Comments: Rose [name] is a 61 y.o. female BIB EMS who presents to the Emergency Department complaining of recurrent, persistent 7/10 epigastric pain x 2 days. She notes Hx of the same, but more severe ~1 month ago. She notes her pain is radiating to her mid back and "up". Pt reports the pain is exacerbated when "bending over". She notes associated constipation, (Last bowel movement 2 days ago )nausea, thirst, lightheadedness, unexpected weight loss and decreased appetite. 50 pound weight loss over 3 months She reveals her last bowel movement was 2 nights ago and it was loose. She notes a baseline groin itch and stomach redness for several years. Hx of type 2 diabetes, pancreatitis, pancreatic cysts, right sided ear pain x  "a couple years" and a leaky heart valve reported. Pt is a smoker who notes Hx of social drinking. Pt dadmits to nausea denies vomiting, fever, and diarrhea. Gastroenterologist is Dr. Carollee Massed at [place]; PCP is Darryl Lent at [facility]. No other associated symptoms. Patient was evaluated at [facility] emergency Department yesterday. She reportedly did not stay for entire evaluation. She did have a CT scan of abdomen and pelvis without contrast which showed changes of chronic pancreatitis no evidence of acute pancreatitis, moderate diverticulosis no evidence of acute diverticulitis or other acute inflammatory process  Past Medical History:  Diagnosis  Date  . Allergy   . Asthma   . Chronic headaches   . Diabetes (HCC)   . High cholesterol   . History of blood clots   . Stroke [facility])     Patient Active Problem List   Diagnosis Date Noted  . Deformity of metatarsal bone of left foot 04/16/2015  . Pain in lower limb 03/30/2015  . Sinus tarsitis of left foot 03/30/2015  . Arthritis of ankle, left, degenerative 03/30/2015  . 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 Cigarette smoker 03/24/2015    Past Surgical History:  Procedure Laterality Date  . BREAST REDUCTION SURGERY    . CHOLECYSTECTOMY  1998  . VAGINAL HYSTERECTOMY  1988    OB History    No data available       Home Medications    Prior to Admission medications   Medication Sig Start Date End Date Taking? Authorizing Provider  albuterol (PROVENTIL HFA;VENTOLIN HFA) 108 (90 Base) MCG/ACT inhaler Inhale 2 puffs into the lungs every 6 (six) hours as needed for wheezing or shortness of breath. 01/19/16   Leslye Peer, MD  aspirin EC 81 MG tablet Take 81 mg by mouth.    Historical Provider, MD  clonazePAM (KLONOPIN) 1 MG tablet Take 1 mg by mouth.    Historical Provider, MD  cyclobenzaprine (FLEXERIL) 10 MG tablet TK 1 T PO TID PRF MUSCLE SPASM 03/02/15   Historical Provider, MD  diphenhydrAMINE (BENADRYL) 25 MG tablet Take 2 tablets (50 mg total) by mouth every 8 (eight) hours as needed. 06/03/15   Myeong O Sheard, DPM  docusate sodium (COLACE) 100 MG capsule  Take 100 mg by mouth. 06/17/14   Historical Provider, MD  DULoxetine (CYMBALTA) 60 MG capsule Take 1 capsule by mouth 2 (two) times daily. 12/10/13   Historical Provider, MD  estradiol (ESTRACE) 0.5 MG tablet TK 1 T PO D 01/25/15   Historical Provider, MD  gabapentin (NEURONTIN) 600 MG tablet TK 1 T PO TID 03/01/15   Historical Provider, MD  meloxicam (MOBIC) 15 MG tablet TK 1 T PO D 03/11/15   Historical Provider, MD  metFORMIN (GLUCOPHAGE-XR) 500 MG 24 hr tablet Take 2 tablets by mouth daily. 01/26/14   Historical Provider, MD    nitroGLYCERIN (NITROSTAT) 0.4 MG SL tablet Place 0.4 mg under the tongue every 5 (five) minutes as needed for chest pain.    Historical Provider, MD  nystatin-triamcinolone (MYCOLOG II) cream Apply 1 application topically 2 (two) times daily.    Historical Provider, MD  omega-3 acid ethyl esters (LOVAZA) 1 G capsule TK 2 CS PO BID 01/25/15   Historical Provider, MD  omeprazole (PRILOSEC) 20 MG capsule TK ONE C PO BID 03/06/15   Historical Provider, MD  oxyCODONE-acetaminophen (PERCOCET) 10-325 MG tablet Take 1 tablet by mouth every 8 (eight) hours as needed for pain. 05/31/16   Myeong Christianne Dolin, DPM  traZODone (DESYREL) 100 MG tablet TK 2 TS PO QHS 03/03/15   Historical Provider, MD    Family History Family History  Problem Relation Age of Onset  . Emphysema Mother   . Heart disease Mother   . Cancer Mother   . Heart disease Father   . Cancer Sister     Social History Social History  Substance Use Topics  . Smoking status: Current Every Day Smoker    Packs/day: 1.00    Years: 47.00    Types: Cigarettes, E-cigarettes  . Smokeless tobacco: Never Used     Comment: 1/2 - 1 PPD -- Heavy secondhand smoke exposure  . Alcohol use Not on file   Results for orders placed or performed during the hospital encounter of 07/01/16  Comprehensive metabolic panel  Result Value Ref Range   Sodium 137 135 - 145 mmol/L   Potassium 3.7 3.5 - 5.1 mmol/L   Chloride 102 101 - 111 mmol/L   CO2 28 22 - 32 mmol/L   Glucose, Bld 99 65 - 99 mg/dL   BUN 8 6 - 20 mg/dL   Creatinine, Ser 9.60 0.44 - 1.00 mg/dL   Calcium 8.8 (L) 8.9 - 10.3 mg/dL   Total Protein 6.7 6.5 - 8.1 g/dL   Albumin 3.7 3.5 - 5.0 g/dL   AST 22 15 - 41 U/L   ALT 19 14 - 54 U/L   Alkaline Phosphatase 101 38 - 126 U/L   Total Bilirubin 0.4 0.3 - 1.2 mg/dL   GFR calc non Af Amer >60 >60 mL/min   GFR calc Af Amer >60 >60 mL/min   Anion gap 7 5 - 15  CBC with Differential/Platelet  Result Value Ref Range   WBC 5.1 4.0 - 10.5 K/uL    RBC 4.44 3.87 - 5.11 MIL/uL   Hemoglobin 12.7 12.0 - 15.0 g/dL   HCT 45.4 09.8 - 11.9 %   MCV 83.1 78.0 - 100.0 fL   MCH 28.6 26.0 - 34.0 pg   MCHC 34.4 30.0 - 36.0 g/dL   RDW 14.7 82.9 - 56.2 %   Platelets 199 150 - 400 K/uL   Neutrophils Relative % 65 %   Neutro Abs 3.3 1.7 - 7.7 K/uL  Lymphocytes Relative 28 %   Lymphs Abs 1.5 0.7 - 4.0 K/uL   Monocytes Relative 4 %   Monocytes Absolute 0.2 0.1 - 1.0 K/uL   Eosinophils Relative 2 %   Eosinophils Absolute 0.1 0.0 - 0.7 K/uL   Basophils Relative 1 %   Basophils Absolute 0.0 0.0 - 0.1 K/uL  Lipase, blood  Result Value Ref Range   Lipase 21 11 - 51 U/L  Troponin I  Result Value Ref Range   Troponin I <0.03 <0.03 ng/mL   No results found.  Allergies   Nickel; Oxycodone; Codeine; Hydrocodone-acetaminophen; Morphine; Propoxyphene; and Quetiapine   Review of Systems Review of Systems  Constitutional: Positive for unexpected weight change.  HENT: Positive for ear pain.        Pain for several years  Respiratory: Negative.   Cardiovascular: Negative.   Gastrointestinal: Positive for abdominal pain and nausea.       Epigastric  Musculoskeletal: Negative.   Skin: Positive for rash.       Pruritic Rash at suprapubic area for several years  Allergic/Immunologic: Positive for immunocompromised state.       Diabetic  Neurological: Negative.   Psychiatric/Behavioral: Negative.   All other systems reviewed and are negative.  A complete 10 system review of systems was obtained and all systems are negative except as noted in the HPI and PMH.    Physical Exam Updated Vital Signs BP 133/80 (BP Location: Left Arm)   Pulse 93   Temp 97.5 F (36.4 C) (Oral)   Resp 17   Ht 5\' 3"  (1.6 m)   Wt 177 lb (80.3 kg)   SpO2 98%   BMI 31.35 kg/m   Physical Exam  Constitutional: She is oriented to person, place, and time. Vital signs are normal. She appears well-developed and well-nourished.  Non-toxic appearance. No distress.   Afebrile, nontoxic, NAD  HENT:  Head: Normocephalic and atraumatic.  Mouth/Throat: Mucous membranes are normal.  Eyes: Conjunctivae and EOM are normal. Right eye exhibits no discharge. Left eye exhibits no discharge.  Neck: Normal range of motion. Neck supple.  Cardiovascular: Normal rate and intact distal pulses.   Pulmonary/Chest: Effort normal. No respiratory distress.  Abdominal: Normal appearance. She exhibits no distension. There is tenderness. There is no guarding.  Mildly tender at epigastrium  Musculoskeletal: Normal range of motion.  Neurological: She is alert and oriented to person, place, and time. She has normal strength. No sensory deficit.  Skin: Skin is warm, dry and intact. Rash noted.  Excoriated at inferior aspect of the panniculus  otherwise no rash  Psychiatric: She has a normal mood and affect. Her behavior is normal.  Nursing note and vitals reviewed.    ED Treatments / Results  DIAGNOSTIC STUDIES: Oxygen Saturation is 98% on RA, normal by my interpretation.    COORDINATION OF CARE: 3:18 PM Discussed treatment plan with pt at bedside and pt agreed to plan. Will order labs and IV fluids.  Labs (all labs ordered are listed, but only abnormal results are displayed) Labs Reviewed - No data to display  EKG  EKG Interpretation None       Date: 07/01/2016  Rate: 80  Rhythm: normal sinus rhythm  QRS Axis: normal  Intervals: normal  ST/T Wave abnormalities: normal  Conduction Disutrbances: none  Narrative Interpretation: unremarkable    Radiology No results found.  Procedures Procedures (including critical care time)  Medications Ordered in ED Medications - No data to display   Initial Impression /  Assessment and Plan / ED Course  I have reviewed the triage vital signs and the nursing notes.  Pertinent labs & imaging results that were available during my care of the patient were reviewed by me and considered in my medical decision making (see  chart for details).    5:15 PM patient is alert and relates without difficulty able to drink water without difficulty and feels improved after treatment with intravenous hydration and intravenous Reglan.  Feel the patient is on too much medicine including opioids, benzodiazepines and may be lightheaded from polypharmacy. Will Not prescribe further medications. I've advised her to contact her primary care physician in 2 days to schedule follow-up appointment for next week. Her general hydration.      Final Clinical Impressions(s) / ED Diagnoses  Dx is #1 epigastric abdominal pain #2 generalized weakness Final diagnoses:  None   #3chronic rash New Prescriptions New Prescriptions   No medications on file     Doug Sou, MD 07/01/16 1721    Doug Sou, MD 07/01/16 1724

## 2016-07-05 ENCOUNTER — Encounter: Payer: Self-pay | Admitting: Podiatry

## 2016-07-05 ENCOUNTER — Ambulatory Visit (INDEPENDENT_AMBULATORY_CARE_PROVIDER_SITE_OTHER): Payer: Medicare Other | Admitting: Podiatry

## 2016-07-05 VITALS — BP 148/89 | HR 91

## 2016-07-05 DIAGNOSIS — M19072 Primary osteoarthritis, left ankle and foot: Secondary | ICD-10-CM

## 2016-07-05 DIAGNOSIS — M659 Synovitis and tenosynovitis, unspecified: Secondary | ICD-10-CM | POA: Diagnosis not present

## 2016-07-05 DIAGNOSIS — M19071 Primary osteoarthritis, right ankle and foot: Secondary | ICD-10-CM | POA: Diagnosis not present

## 2016-07-05 MED ORDER — OXYCODONE-ACETAMINOPHEN 10-325 MG PO TABS: 1.0000 | ORAL_TABLET | Freq: Two times a day (BID) | ORAL | 0 refills | Status: DC | PRN

## 2016-07-05 NOTE — Patient Instructions (Signed)
Pain in right ankle since her last physical therapy. Pain medication prescribed as per request. Return as needed.

## 2016-07-05 NOTE — Progress Notes (Signed)
Subjective: 61 year old female presents stating that she is having pain on right ankle since they tried to do physical therapy to stretch her legs for painful back condition. She went to [facility] physical therapy last week.  Patient is wearing Custom orthotics, made of leather, from [employer]. They are helping her.   Past surgical history reveal: 2011 injury to right ankle, broken ankle that was repaired with screw and plate in 7253. Hardware was taken out January 2016.  Sprained left ankle 2011 with unresolved ankle pain.  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   Objective: No changes since the last visit.  No abnormal skin lesions on both feet. Neurologic: All epicritic and tactile sensations grossly intact bilateral. Vascular: Edema on right medial ankle with pain. Orthopedic:  Noted of no edema or erythema on right ankle. Positive of stiff ankle joint with range of motion and Subtalar joint motion.  Elevated first ray with lateral weight shifting left >right.  Severe pain with end range of motion on both subtalar and ankle joint right.  Assessment: Arthropathy ankle joint bilateral.  S/P Sprained left ankle 2011 Severe ankle and STJ pronation bilateral R>L. Elevated metatarsal bone with rearfoot pronation upon loading of forefoot bilateral. . Plan: Reviewed findings and available treatment options.  Today patient wants to have pain medication prescribed.

## 2016-07-07 ENCOUNTER — Telehealth: Payer: Self-pay | Admitting: Acute Care

## 2016-07-07 ENCOUNTER — Inpatient Hospital Stay: Admission: RE | Admit: 2016-07-07 | Payer: Medicare Other | Source: Ambulatory Visit

## 2016-07-07 ENCOUNTER — Encounter: Payer: Medicare Other | Admitting: Acute Care

## 2016-07-07 NOTE — Telephone Encounter (Signed)
Will forward to the lung cancer screening pool to follow up on.

## 2016-07-12 NOTE — Telephone Encounter (Signed)
Spoke with pt and rescheduled appt for [facility] 07/21/16 at 3:30.  CT will be rescheduled as well.  Nothing further needed.

## 2016-07-21 ENCOUNTER — Ambulatory Visit: Payer: Medicare Other

## 2016-07-21 ENCOUNTER — Encounter: Payer: Self-pay | Admitting: Acute Care

## 2016-07-21 ENCOUNTER — Ambulatory Visit (INDEPENDENT_AMBULATORY_CARE_PROVIDER_SITE_OTHER): Payer: Medicare Other | Admitting: Acute Care

## 2016-07-21 DIAGNOSIS — F17210 Nicotine dependence, cigarettes, uncomplicated: Secondary | ICD-10-CM

## 2016-07-21 NOTE — Progress Notes (Signed)
Shared Decision Making Visit Lung Cancer Screening Program [phone])   Eligibility:  Age 61 y.o.  Pack Years Smoking History Calculation 91 pack year smoking history (# packs/per year x # years smoked)  Recent History of coughing up blood  no  Unexplained weight loss? no ( >Than 15 pounds within the last 6 months )  Prior History Lung / other cancer no (Diagnosis within the last 5 years already requiring surveillance chest CT Scans).  Smoking Status Current Smoker  Former Smokers: Years since quit:NA  Quit Date: NA  Visit Components:  Discussion included one or more decision making aids. yes  Discussion included risk/benefits of screening. yes  Discussion included potential follow up diagnostic testing for abnormal scans. yes  Discussion included meaning and risk of over diagnosis. yes  Discussion included meaning and risk of False Positives. yes  Discussion included meaning of total radiation exposure. yes  Counseling Included:  Importance of adherence to annual lung cancer LDCT screening. yes  Impact of comorbidities on ability to participate in the program. yes  Ability and willingness to under diagnostic treatment. yes  Smoking Cessation Counseling:  Current Smokers:   Discussed importance of smoking cessation. yes  Information about tobacco cessation classes and interventions provided to patient. yes  Patient provided with "ticket" for LDCT Scan. yes  Symptomatic Patient. no  Counseling  Diagnosis Code: Tobacco Use Z72.0  Asymptomatic Patient yes  Counseling (Intermediate counseling: > three minutes counseling) U2025  Former Smokers:   Discussed the importance of maintaining cigarette abstinence. yes  Diagnosis Code: Personal History of Nicotine Dependence. K27.062  Information about tobacco cessation classes and interventions provided to patient. Yes  Patient provided with "ticket" for LDCT Scan. yes  Written Order for Lung Cancer  Screening with LDCT placed in Epic. Yes (CT Chest Lung Cancer Screening Low Dose W/O CM) [id number] Z12.2-Screening of respiratory organs Z87.891-Personal history of nicotine dependence  I spent 3-4 minutes counseling Rose [name] on smoking cessation.  I have spent 25 minutes of face to face time with Rose [name] discussing the risks and benefits of lung cancer screening. We viewed a power point together that explained in detail the above noted topics. We paused at intervals to allow for questions to be asked and answered to ensure understanding.We discussed that the single most powerful action that she can take to decrease her risk of developing lung cancer is to quit smoking. We discussed whether or not she is ready to commit to setting a quit date. She is currently not ready to set a quit date .We discussed options for tools to aid in quitting smoking including nicotine replacement therapy, non-nicotine medications, support groups, Quit Smart classes, and behavior modification. We discussed that often times setting smaller, more achievable goals, such as eliminating 1 cigarette a day for a week and then 2 cigarettes a day for a week can be helpful in slowly decreasing the number of cigarettes smoked. This allows for a sense of accomplishment as well as providing a clinical benefit. I gave her the " Be Stronger Than Your Excuses" card with contact information for community resources, classes, free nicotine replacement therapy, and access to mobile apps, text messaging, and on-line smoking cessation help. I have also given her my card and contact information in the event she needs to contact me. We discussed the time and location of the scan, and that either Jerolyn Shin, CMA, or I will call with the results within 24-48 hours of receiving them. I have provided Mrs.  [name] with a copy of the power point we viewed  as a resource in the event they need reinforcement of the concepts we discussed today in the  office. The patient verbalized understanding of all of  the above and had no further questions upon leaving the office. They have my contact information in the event they have any further questions.  Bevelyn Ngo, NP 07/21/2016

## 2016-07-24 ENCOUNTER — Ambulatory Visit (INDEPENDENT_AMBULATORY_CARE_PROVIDER_SITE_OTHER)
Admission: RE | Admit: 2016-07-24 | Discharge: 2016-07-24 | Disposition: A | Discharge status: Home / Self Care | Payer: Medicare Other | Source: Ambulatory Visit | Attending: Acute Care | Admitting: Acute Care

## 2016-07-24 ENCOUNTER — Inpatient Hospital Stay: Admission: RE | Admit: 2016-07-24 | Payer: Medicare Other | Source: Ambulatory Visit

## 2016-07-24 DIAGNOSIS — Z87891 Personal history of nicotine dependence: Secondary | ICD-10-CM | POA: Diagnosis not present

## 2016-07-24 DIAGNOSIS — F17210 Nicotine dependence, cigarettes, uncomplicated: Secondary | ICD-10-CM

## 2016-07-25 ENCOUNTER — Telehealth: Payer: Self-pay | Admitting: Acute Care

## 2016-07-25 DIAGNOSIS — F17210 Nicotine dependence, cigarettes, uncomplicated: Secondary | ICD-10-CM

## 2016-07-25 NOTE — Telephone Encounter (Signed)
Pt had a lung cancer screening CT. Will route message to lung nodule pool.

## 2016-07-26 NOTE — Telephone Encounter (Signed)
Patient checking for CT scan results -pr

## 2016-07-27 ENCOUNTER — Ambulatory Visit (INDEPENDENT_AMBULATORY_CARE_PROVIDER_SITE_OTHER): Payer: Medicare Other | Admitting: Podiatry

## 2016-07-27 ENCOUNTER — Encounter: Payer: Self-pay | Admitting: Podiatry

## 2016-07-27 DIAGNOSIS — M19172 Post-traumatic osteoarthritis, left ankle and foot: Secondary | ICD-10-CM | POA: Diagnosis not present

## 2016-07-27 DIAGNOSIS — M216X9 Other acquired deformities of unspecified foot: Secondary | ICD-10-CM | POA: Diagnosis not present

## 2016-07-27 DIAGNOSIS — M659 Synovitis and tenosynovitis, unspecified: Secondary | ICD-10-CM

## 2016-07-27 MED ORDER — DIPHENHYDRAMINE HCL 50 MG PO TABS: 50.0000 mg | ORAL_TABLET | Freq: Two times a day (BID) | ORAL | 0 refills | Status: DC | PRN

## 2016-07-27 MED ORDER — OXYCODONE-ACETAMINOPHEN 10-325 MG PO TABS: 1.0000 | ORAL_TABLET | Freq: Two times a day (BID) | ORAL | 0 refills | Status: DC | PRN

## 2016-07-27 NOTE — Progress Notes (Signed)
Objective: 61 year old female presents complaining of pain in right medial ankle. Stated that her old brace broke off and her ankle is very painful with ambulation. Stated that this time she would like to take pain medication rather than repeat injection.   Past surgical history reveal: 2011 injury to right ankle, broken ankle that was repaired with screw and plate in 2355. Hardware was taken out January 2016.  Sprained left ankle 2011 with unresolved ankle pain.  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   Objective: No changes since the last visit.  No abnormal skin lesions on both feet. Neurologic: All epicritic and tactile sensations grossly intact bilateral. Vascular: Edema on right medial ankle with pain. Orthopedic:  Noted of no edema or erythema on right ankle. Positive of stiff ankle joint with range of motion and Subtalar joint motion.  Elevated first ray with lateral weight shifting left >right.  Severe pain with endrange of motion on both subtalar and ankle joint right.  Assessment: Arthropathy ankle joint bilateral.  S/P Sprained left ankle 2011 Severe ankle and STJ pronation bilateral R>L. Elevated metatarsal bone with rearfoot pronation upon loading of forefoot bilateral. . Plan: Reviewed findings and available treatment options.  Right ankle placed in Ankle brace with instruction. Patient will return for left foot if this one works. Today patient wants to have pain medication prescribed.

## 2016-07-27 NOTE — Patient Instructions (Signed)
Seen for pain in right ankle.  Placed ina ankle brace on right. Rx re ordered for Oxycodone and benadryl. Return as needed.

## 2016-07-28 NOTE — Telephone Encounter (Signed)
Patient is calling for results from Lung screening.

## 2016-07-28 NOTE — Telephone Encounter (Signed)
Spoke with pt and given results of CT per Kandice Robinsons.  Results sent to referring provider Darryl Lent.  Pt verbalized understanding.  Order placed for repeat low dose ct in 1 year.

## 2016-09-19 ENCOUNTER — Encounter (HOSPITAL_BASED_OUTPATIENT_CLINIC_OR_DEPARTMENT_OTHER): Payer: Self-pay | Admitting: Emergency Medicine

## 2016-09-19 ENCOUNTER — Emergency Department (HOSPITAL_BASED_OUTPATIENT_CLINIC_OR_DEPARTMENT_OTHER): Payer: Medicare Other

## 2016-09-19 ENCOUNTER — Emergency Department (HOSPITAL_BASED_OUTPATIENT_CLINIC_OR_DEPARTMENT_OTHER)
Admission: EM | Admit: 2016-09-19 | Discharge: 2016-09-19 | Disposition: A | Discharge status: Home / Self Care | Payer: Medicare Other | Attending: Emergency Medicine | Admitting: Emergency Medicine

## 2016-09-19 DIAGNOSIS — R1013 Epigastric pain: Secondary | ICD-10-CM

## 2016-09-19 DIAGNOSIS — E119 Type 2 diabetes mellitus without complications: Secondary | ICD-10-CM | POA: Insufficient documentation

## 2016-09-19 DIAGNOSIS — Z7982 Long term (current) use of aspirin: Secondary | ICD-10-CM | POA: Insufficient documentation

## 2016-09-19 DIAGNOSIS — J45909 Unspecified asthma, uncomplicated: Secondary | ICD-10-CM | POA: Insufficient documentation

## 2016-09-19 DIAGNOSIS — Z7984 Long term (current) use of oral hypoglycemic drugs: Secondary | ICD-10-CM | POA: Insufficient documentation

## 2016-09-19 DIAGNOSIS — K2950 Unspecified chronic gastritis without bleeding: Secondary | ICD-10-CM | POA: Diagnosis not present

## 2016-09-19 DIAGNOSIS — J449 Chronic obstructive pulmonary disease, unspecified: Secondary | ICD-10-CM | POA: Diagnosis not present

## 2016-09-19 DIAGNOSIS — F17210 Nicotine dependence, cigarettes, uncomplicated: Secondary | ICD-10-CM | POA: Insufficient documentation

## 2016-09-19 HISTORY — DX: Acute pancreatitis without necrosis or infection, unspecified: K85.90

## 2016-09-19 LAB — COMPREHENSIVE METABOLIC PANEL
ALT: 12 U/L — ABNORMAL LOW (ref 14–54)
AST: 18 U/L (ref 15–41)
Albumin: 3.8 g/dL (ref 3.5–5.0)
Alkaline Phosphatase: 155 U/L — ABNORMAL HIGH (ref 38–126)
Anion gap: 10 (ref 5–15)
BUN: <5 mg/dL — ABNORMAL LOW (ref 6–20)
CO2: 24 mmol/L (ref 22–32)
Calcium: 9 mg/dL (ref 8.9–10.3)
Chloride: 102 mmol/L (ref 101–111)
Creatinine, Ser: 0.79 mg/dL (ref 0.44–1.00)
GFR calc Af Amer: >60 mL/min (ref >60)
GFR calc non Af Amer: >60 mL/min (ref >60)
Glucose, Bld: 122 mg/dL — ABNORMAL HIGH (ref 65–99)
Potassium: 3.6 mmol/L (ref 3.5–5.1)
Sodium: 136 mmol/L (ref 135–145)
Total Bilirubin: 0.4 mg/dL (ref 0.3–1.2)
Total Protein: 7.5 g/dL (ref 6.5–8.1)

## 2016-09-19 LAB — CBC
HCT: 38.8 % (ref 36.0–46.0)
Hemoglobin: 13.7 g/dL (ref 12.0–15.0)
MCH: 28 pg (ref 26.0–34.0)
MCHC: 35.3 g/dL (ref 30.0–36.0)
MCV: 79.3 fL (ref 78.0–100.0)
Platelets: 300 10*3/uL (ref 150–400)
RBC: 4.89 MIL/uL (ref 3.87–5.11)
RDW: 13.2 % (ref 11.5–15.5)
WBC: 5.1 10*3/uL (ref 4.0–10.5)

## 2016-09-19 LAB — URINALYSIS, ROUTINE W REFLEX MICROSCOPIC
Bilirubin Urine: NEGATIVE
Glucose, UA: NEGATIVE mg/dL
Hgb urine dipstick: NEGATIVE
Ketones, ur: 15 mg/dL — AB
Leukocytes, UA: NEGATIVE
Nitrite: NEGATIVE
Protein, ur: NEGATIVE mg/dL
Specific Gravity, Urine: 1.002 — ABNORMAL LOW (ref 1.005–1.030)
pH: 6 (ref 5.0–8.0)

## 2016-09-19 LAB — LIPASE, BLOOD: Lipase: 25 U/L (ref 11–51)

## 2016-09-19 MED ORDER — SODIUM CHLORIDE 0.9 % IV BOLUS (SEPSIS)
1000.0000 mL | Freq: Once | INTRAVENOUS | Status: AC
  Administered 2016-09-19: 1000 mL via INTRAVENOUS

## 2016-09-19 MED ORDER — FAMOTIDINE 40 MG PO TABS: 40.0000 mg | ORAL_TABLET | Freq: Every day | ORAL | 0 refills | Status: DC

## 2016-09-19 MED ORDER — ONDANSETRON HCL 4 MG/2ML IJ SOLN
4.0000 mg | Freq: Once | INTRAMUSCULAR | Status: AC
  Administered 2016-09-19: 4 mg via INTRAVENOUS
  Filled 2016-09-19: qty 2

## 2016-09-19 MED ORDER — SUCRALFATE 1 G PO TABS: 1.0000 g | ORAL_TABLET | Freq: Four times a day (QID) | ORAL | 0 refills | Status: DC

## 2016-09-19 MED ORDER — IOPAMIDOL (ISOVUE-300) INJECTION 61%
100.0000 mL | Freq: Once | INTRAVENOUS | Status: AC | PRN
  Administered 2016-09-19: 100 mL via INTRAVENOUS

## 2016-09-19 MED ORDER — HYDROMORPHONE HCL 1 MG/ML IJ SOLN
0.5000 mg | INTRAMUSCULAR | Status: DC | PRN
  Administered 2016-09-19: 0.5 mg via INTRAVENOUS
  Filled 2016-09-19: qty 1

## 2016-09-19 MED ORDER — ONDANSETRON 4 MG PO TBDP: 4.0000 mg | ORAL_TABLET | Freq: Three times a day (TID) | ORAL | 0 refills | Status: DC | PRN

## 2016-09-19 MED FILL — SUCRALFATE 1 GM TABLET: 1 | 15 days supply | Qty: 60 | Fill #0

## 2016-09-19 MED FILL — ONDANSETRON ODT 4 MG TABLET: 4 | 4 days supply | Qty: 6 | Fill #0

## 2016-09-19 MED FILL — FAMOTIDINE 40 MG TABLET: 40 | 30 days supply | Qty: 30 | Fill #0

## 2016-09-19 NOTE — Discharge Instructions (Signed)
Small meals. Stop prilosec. Zofran as needed for nausea New Rx: Pepcid (acid reducer) and Carafate (coats stomach and reduces pain from   gastritis) Call [facility] fo appointment for possible endoscopy.

## 2016-09-19 NOTE — ED Provider Notes (Signed)
[facility] Note   CSN: [id number] Arrival date & time: 09/19/16  4098     History   Chief Complaint Chief Complaint  Patient presents with  . Abdominal Pain    HPI Rose [name] is a 61 y.o. female. CC:  Abdominal pain  HPI:  Patient presents for evaluation of abdominal pain of over one years duration. She has epigastric pain. She states that "all my doctors can't tell me much wrong". She had upper and lower endoscopy. She does not recall the results. She does not know if she's ever been told she had an ulcer. She states she had a CAT scan was told she had a "cyst on my pancreas". States she has lost weight but cannot specify how much. When asked about specific details patient continuously sites "I have short-term memory loss, and ADD".  She thinks she may have weighed 275 last year but also states this might be 2:30. However then she states "I'm not sure if either way over 200".  She is not disoriented. She seems disinterested in offering details. She is on Prilosec. However she states "I didn't take it yesterday my stomach didn't hurt so not taking that anymore. She does not drink. She smokes daily. She does not know her last bowel movement. She states "I have diarrhea and constipation, I have IBS". Also complains of low back pain stating "I have 3 herniated discs".  Past Medical History:  Diagnosis Date  . Allergy   . Asthma   . Chronic headaches   . Diabetes (HCC)   . High cholesterol   . History of blood clots   . Pancreatitis   . Stroke [facility])     Patient Active Problem List   Diagnosis Date Noted  . Deformity of metatarsal bone of left foot 04/16/2015  . Pain in lower limb 03/30/2015  . Sinus tarsitis of left foot 03/30/2015  . Arthritis of ankle, left, degenerative 03/30/2015  . 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 Cigarette smoker 03/24/2015    Past Surgical History:  Procedure Laterality Date  . BREAST REDUCTION SURGERY    . CHOLECYSTECTOMY  1998    . VAGINAL HYSTERECTOMY  1988    OB History    No data available       Home Medications    Prior to Admission medications   Medication Sig Start Date End Date Taking? Authorizing Provider  albuterol (PROVENTIL HFA;VENTOLIN HFA) 108 (90 Base) MCG/ACT inhaler Inhale 2 puffs into the lungs every 6 (six) hours as needed for wheezing or shortness of breath. 01/19/16   Leslye Peer, MD  aspirin EC 81 MG tablet Take 81 mg by mouth.    Historical Provider, MD  clonazePAM (KLONOPIN) 1 MG tablet Take 1 mg by mouth.    Historical Provider, MD  cyclobenzaprine (FLEXERIL) 10 MG tablet TK 1 T PO TID PRF MUSCLE SPASM 03/02/15   Historical Provider, MD  diphenhydrAMINE (BENADRYL) 50 MG tablet Take 1 tablet (50 mg total) by mouth every 12 (twelve) hours as needed for itching. 07/27/16   Myeong O Sheard, DPM  docusate sodium (COLACE) 100 MG capsule Take 100 mg by mouth. 06/17/14   Historical Provider, MD  DULoxetine (CYMBALTA) 60 MG capsule Take 1 capsule by mouth 2 (two) times daily. 12/10/13   Historical Provider, MD  estradiol (ESTRACE) 0.5 MG tablet TK 1 T PO D 01/25/15   Historical Provider, MD  famotidine (PEPCID) 40 MG tablet Take 1 tablet (40 mg total) by mouth  daily. 09/19/16   Rolland Porter, MD  gabapentin (NEURONTIN) 600 MG tablet TK 1 T PO TID 03/01/15   Historical Provider, MD  meloxicam (MOBIC) 15 MG tablet TK 1 T PO D 03/11/15   Historical Provider, MD  metFORMIN (GLUCOPHAGE-XR) 500 MG 24 hr tablet Take 2 tablets by mouth daily. 01/26/14   Historical Provider, MD  nitroGLYCERIN (NITROSTAT) 0.4 MG SL tablet Place 0.4 mg under the tongue every 5 (five) minutes as needed for chest pain.    Historical Provider, MD  nystatin-triamcinolone (MYCOLOG II) cream Apply 1 application topically 2 (two) times daily.    Historical Provider, MD  omega-3 acid ethyl esters (LOVAZA) 1 G capsule TK 2 CS PO BID 01/25/15   Historical Provider, MD  omeprazole (PRILOSEC) 20 MG capsule TK ONE C PO BID 03/06/15   Historical  Provider, MD  ondansetron (ZOFRAN ODT) 4 MG disintegrating tablet Take 1 tablet (4 mg total) by mouth every 8 (eight) hours as needed for nausea. 09/19/16   Rolland Porter, MD  oxyCODONE-acetaminophen (PERCOCET) 10-325 MG tablet Take 1 tablet by mouth every 12 (twelve) hours as needed for pain. 07/27/16   Myeong O Sheard, DPM  sucralfate (CARAFATE) 1 g tablet Take 1 tablet (1 g total) by mouth 4 (four) times daily. 09/19/16   Rolland Porter, MD  traZODone (DESYREL) 100 MG tablet TK 2 TS PO QHS 03/03/15   Historical Provider, MD    Family History Family History  Problem Relation Age of Onset  . Emphysema Mother   . Heart disease Mother   . Cancer Mother   . Heart disease Father   . Cancer Sister     Social History Social History  Substance Use Topics  . Smoking status: Current Every Day Smoker    Packs/day: 1.00    Years: 47.00    Types: Cigarettes, E-cigarettes  . Smokeless tobacco: Never Used     Comment: 1/2 - 1 PPD -- Heavy secondhand smoke exposure  . Alcohol use No     Allergies   Nickel; Oxycodone; Codeine; Hydrocodone-acetaminophen; Morphine; Propoxyphene; and Quetiapine   Review of Systems Review of Systems  Constitutional: Positive for activity change and unexpected weight change. Negative for appetite change, chills, diaphoresis, fatigue and fever.  HENT: Negative for mouth sores, sore throat and trouble swallowing.   Eyes: Negative for visual disturbance.  Respiratory: Negative for cough, chest tightness, shortness of breath and wheezing.   Cardiovascular: Negative for chest pain.  Gastrointestinal: Positive for abdominal pain, constipation, diarrhea, nausea and vomiting. Negative for abdominal distention.  Endocrine: Negative for polydipsia, polyphagia and polyuria.  Genitourinary: Negative for dysuria, frequency and hematuria.  Musculoskeletal: Positive for back pain. Negative for gait problem.  Skin: Negative for color change, pallor and rash.  Neurological: Negative  for dizziness, syncope, light-headedness and headaches.  Hematological: Does not bruise/bleed easily.  Psychiatric/Behavioral: Negative for behavioral problems and confusion.     Physical Exam Updated Vital Signs BP (!) 150/88   Pulse 77   Temp 98.1 F (36.7 C) (Oral)   Resp 16   Ht 5\' 3"  (1.6 m)   Wt 160 lb (72.6 kg)   SpO2 94%   BMI 28.34 kg/m   Physical Exam  Constitutional: She is oriented to person, place, and time. She appears well-developed and well-nourished. No distress.  Chronically ill-appearing, but not acutely ill-appearing 61 year old female. Appears older than her stated age of 14  HENT:  Head: Normocephalic.  Eyes: Conjunctivae are normal. Pupils are equal, round, and  reactive to light. No scleral icterus.  Conjunctiva not pale. No scleral icterus.  Neck: Normal range of motion. Neck supple. No thyromegaly present.  Cardiovascular: Normal rate and regular rhythm.  Exam reveals no gallop and no friction rub.   No murmur heard. Pulmonary/Chest: Effort normal and breath sounds normal. No respiratory distress. She has no wheezes. She has no rales.  Abdominal: Soft. Bowel sounds are normal. She exhibits no distension. There is no tenderness. There is no rebound.  Soft. Hypoactive bowel sounds. No guarding or rebound. No distention.  Musculoskeletal: Normal range of motion.  Neurological: She is alert and oriented to person, place, and time.  Skin: Skin is warm and dry. No rash noted.  Psychiatric: She has a normal mood and affect. Her behavior is normal.     ED Treatments / Results  Labs (all labs ordered are listed, but only abnormal results are displayed) Labs Reviewed  COMPREHENSIVE METABOLIC PANEL - Abnormal; Notable for the following:       Result Value   Glucose, Bld 122 (*)    BUN <5 (*)    ALT 12 (*)    Alkaline Phosphatase 155 (*)    All other components within normal limits  URINALYSIS, ROUTINE W REFLEX MICROSCOPIC - Abnormal; Notable for the  following:    APPearance CLOUDY (*)    Specific Gravity, Urine 1.002 (*)    Ketones, ur 15 (*)    All other components within normal limits  LIPASE, BLOOD  CBC    EKG  EKG Interpretation None       Radiology Ct Abdomen Pelvis W Contrast  Result Date: 09/19/2016 CLINICAL DATA:  Nausea and vomiting, abdominal pain, chills EXAM: CT ABDOMEN AND PELVIS WITH CONTRAST TECHNIQUE: Multidetector CT imaging of the abdomen and pelvis was performed using the standard protocol following bolus administration of intravenous contrast. CONTRAST:  ISOVUE-300 IOPAMIDOL (ISOVUE-300) INJECTION 61% COMPARISON:  CT abdomen pelvis of 06/29/2016 FINDINGS: Lower chest: There is minimal parenchymal opacity posterior medially within the right lower lobe on image 3 series 4 which could represent minimal focus of pneumonia. The left lung is clear. Scarring is stable anteriorly within the lingula. Hepatobiliary: The liver enhances with no focal abnormality and no ductal dilatation is seen. Surgical clips are present from prior cholecystectomy. Pancreas: Changes of chronic calcific pancreatitis are again noted. No current evidence of pancreatitis is seen. The peripancreatic fat planes appear well preserved. Spleen: The spleen is unremarkable. Adrenals/Urinary Tract: The adrenal glands appear stable. The kidneys enhance and there is a tiny cyst emanating from the lower pole of the right kidney anteriorly which is stable. On delayed images, the pelvocaliceal systems appear unremarkable. The ureters are normal in caliber. The urinary bladder is not well distended but no abnormality is seen. Stomach/Bowel: The stomach is not distended. No small bowel distention is seen. The colon is largely decompressed and therefore difficult to assess. There are a few rectosigmoid and right-sided diverticula present. The terminal ileum and the appendix are unremarkable. Vascular/Lymphatic: The abdominal aorta is normal in caliber with  moderate abdominal aortic atherosclerosis present. Reproductive: The uterus has been resected previously. The ovaries appear normal in size. No fluid is seen within the pelvis. Other: There does appear to be abdominal mass noted within the anterior upper abdomen near the midline which is unchanged. 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 Degenerative disc disease is noted most marked at L5-S1. IMPRESSION: 1. Stable changes of chronic  calcific pancreatitis. No acute pancreatitis is seen. 2. No explanation for the patient's abdominal pain is seen. 3. Minimal parenchymal opacity posterior medially in the right lower lobe could represent a small focus of pneumonia. 4. The appendix and terminal ileum are unremarkable. Scattered colonic diverticula are present. 5. 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 Electronically Signed   By: Dwyane Dee M.D.   On: 09/19/2016 11:49    Procedures Procedures (including critical care time)  Medications Ordered in ED Medications  HYDROmorphone (DILAUDID) injection 0.5 mg (0.5 mg Intravenous Given 09/19/16 1054)  ondansetron (ZOFRAN) injection 4 mg (4 mg Intravenous Given 09/19/16 1049)  sodium chloride 0.9 % bolus 1,000 mL (1,000 mLs Intravenous New Bag/Given 09/19/16 1051)  iopamidol (ISOVUE-300) 61 % injection 100 mL (100 mLs Intravenous Contrast Given 09/19/16 1121)     Initial Impression / Assessment and Plan / ED Course  I have reviewed the triage vital signs and the nursing notes.  Pertinent labs & imaging results that were available during my care of the patient were reviewed by me and considered in my medical decision making (see chart for details).   no specific etiological concerns after history and physical. We will reimage her abdomen. Repeat hepatobiliary pancreatic enzymes. With heavy smoker with previous negative studies and 4 years since last endoscopy  ulcer gastritis would be high on the list. Discussed with her that she should see GI and will need endoscopy of her testing today is negative.  Final Clinical Impressions(s) / ED Diagnoses   Final diagnoses:  Epigastric pain  Chronic gastritis without bleeding, unspecified gastritis type    Negative testing. CT shows calcific changes of the pancreas consistent with chronic pancreatitis. No distortion of fat planes edema hemorrhage cysts or other findings to suggest acute pancreatitis. Normal hepatobiliary enzymes, and lipase. No leukocytosis or fever.  I discussed with patient that her biggest risk for abdominal pathology as her smoking and most likely etiology would be chronic gastritis or ulcer disease. She is convinced that her PPIs making her worse. We'll have her stop. At Carafate. GI referral. Avoid alcohol, tobacco, caffeine, anti-inflammatories, large meals, spicy foods, or other changes.  New Prescriptions New Prescriptions   FAMOTIDINE (PEPCID) 40 MG TABLET    Take 1 tablet (40 mg total) by mouth daily.   ONDANSETRON (ZOFRAN ODT) 4 MG DISINTEGRATING TABLET    Take 1 tablet (4 mg total) by mouth every 8 (eight) hours as needed for nausea.   SUCRALFATE (CARAFATE) 1 G TABLET    Take 1 tablet (1 g total) by mouth 4 (four) times daily.     Rolland Porter, MD 09/19/16 [phone]

## 2016-09-19 NOTE — ED Triage Notes (Signed)
Patient states that she has had a course cough with mucous and mid epigastric pain x 3 months. The patient reports that she knows this is pancreatitis. The patient is very anxious

## 2016-11-10 ENCOUNTER — Other Ambulatory Visit: Payer: Self-pay | Admitting: Physician Assistant

## 2016-11-10 DIAGNOSIS — Z1231 Encounter for screening mammogram for malignant neoplasm of breast: Secondary | ICD-10-CM

## 2016-12-08 ENCOUNTER — Ambulatory Visit: Payer: Medicare Other

## 2017-01-10 ENCOUNTER — Encounter: Payer: Self-pay | Admitting: Podiatry

## 2017-01-10 ENCOUNTER — Ambulatory Visit (INDEPENDENT_AMBULATORY_CARE_PROVIDER_SITE_OTHER): Payer: Medicare Other | Admitting: Podiatry

## 2017-01-10 DIAGNOSIS — M65979 Unspecified synovitis and tenosynovitis, unspecified ankle and foot: Secondary | ICD-10-CM

## 2017-01-10 DIAGNOSIS — M216X9 Other acquired deformities of unspecified foot: Secondary | ICD-10-CM

## 2017-01-10 DIAGNOSIS — M659 Synovitis and tenosynovitis, unspecified: Secondary | ICD-10-CM | POA: Diagnosis not present

## 2017-01-10 DIAGNOSIS — M19172 Post-traumatic osteoarthritis, left ankle and foot: Secondary | ICD-10-CM

## 2017-01-10 MED ORDER — HYDROCODONE-ACETAMINOPHEN 10-325 MG PO TABS: 1.0000 | ORAL_TABLET | Freq: Two times a day (BID) | ORAL | 0 refills | Status: DC | PRN

## 2017-01-10 NOTE — Patient Instructions (Signed)
Seen for pain in both ankle joint.  All available options reviewed. As per request, Lorcet 10/325 prescribed.  Return as needed.

## 2017-01-10 NOTE — Progress Notes (Signed)
  Objective: 61 year old female presents complaining of pain in both feet at lateral ankles. Pain in right foot at around the sinus tarsi area that gives out when walk. Pain at the same area of left foot hurts to move foot around. Patient request for pain medication. Stated that her ankle brace is not helping.  Past surgical history reveal: 2011 injury to right ankle, broken ankle that was repaired with screw and plate in 4496. Hardware was taken out January 2016.  Sprained left ankle 2011 with unresolved ankle pain.  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   Objective: No changes since the last visit.  No abnormal skin lesions on both feet. Neurologic: All epicritic and tactile sensations grossly intact bilateral. Vascular: Edema on right medial ankle with pain. Orthopedic:  Noted of no edema or erythema on right ankle. Positive of stiff ankle joint with range of motion and Subtalar joint motion.  Elevated first ray with lateral weight shifting left >right.  Severe pain with endrange of motion on both subtalar and ankle joint right.  Assessment: Arthropathy ankle joint bilateral.   S/P Sprained left ankle 2011 Severe ankle and STJ pronation bilateral R>L. Elevated metatarsal bone with rearfoot pronation upon loading of forefoot bilateral. . Plan: Reviewed findings and available treatment options, injection, brace, surgical.  As per request, pain medication prescribed, Lorcet 10/325.

## 2017-03-19 ENCOUNTER — Other Ambulatory Visit: Payer: Self-pay | Admitting: Emergency Medicine

## 2017-03-27 ENCOUNTER — Encounter: Payer: Self-pay | Admitting: Podiatry

## 2017-03-27 ENCOUNTER — Ambulatory Visit (INDEPENDENT_AMBULATORY_CARE_PROVIDER_SITE_OTHER): Payer: Medicare Other | Admitting: Podiatry

## 2017-03-27 DIAGNOSIS — M216X9 Other acquired deformities of unspecified foot: Secondary | ICD-10-CM

## 2017-03-27 DIAGNOSIS — M19172 Post-traumatic osteoarthritis, left ankle and foot: Secondary | ICD-10-CM

## 2017-03-27 DIAGNOSIS — M659 Synovitis and tenosynovitis, unspecified: Secondary | ICD-10-CM | POA: Diagnosis not present

## 2017-03-27 DIAGNOSIS — M65979 Unspecified synovitis and tenosynovitis, unspecified ankle and foot: Secondary | ICD-10-CM

## 2017-03-27 MED ORDER — OXYCODONE-ACETAMINOPHEN 10-325 MG PO TABS: 1.0000 | ORAL_TABLET | Freq: Two times a day (BID) | ORAL | 0 refills | Status: DC | PRN

## 2017-03-27 NOTE — Patient Instructions (Signed)
Seen for bilateral ankle pain. Previously discussed arthritic change in foot and ankle. Will hold off for injection. Pain medication prescribed.

## 2017-03-27 NOTE — Progress Notes (Signed)
Subjective: 61 y.o. year old female patient presents complaining of pain in both ankles. Patient stated that feet hurt on side of ankle both feet. Patient points anterior and lateral aspect of both ankles being the source of foot pain. This has been on going problem since 08/20/2009. 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 Pain has been managed with Cortisone injection and pain pills. Patient is diabetic and A1C was 5.9 last week.  HPI: 2011 injury to right ankle, broken ankle that was repaired with screw and plate in 7680. Hardware was taken out January 2016.  Sprained left ankle 2011 with unresolved ankle pain.  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   Objective: Dermatologic: No abnormal skin lesions. Vascular: Pedal pulses are all palpable.Mild ankle edema right. Orthopedic: Limited motion at ankle and STJ bilateral. Elevated first ray bilateral L>R. Pain induced when attempted range of motion forefoot and rearfoot. Neurologic: All epicritic and tactile sensations grossly intact.  Assessment: Pronation deformity bilateral. Osteoarthritis both ankles. Pain with ambulation.  Treatment: Reviewed available treatment options, NSAIA, injection, pain medication. Patient wishes to hold off on injection. As per request, pain medication prescribed.

## 2017-04-12 ENCOUNTER — Encounter: Payer: Self-pay | Admitting: Podiatry

## 2017-04-12 ENCOUNTER — Ambulatory Visit (INDEPENDENT_AMBULATORY_CARE_PROVIDER_SITE_OTHER): Payer: Medicare Other | Admitting: Podiatry

## 2017-04-12 DIAGNOSIS — M659 Synovitis and tenosynovitis, unspecified: Secondary | ICD-10-CM

## 2017-04-12 DIAGNOSIS — M216X9 Other acquired deformities of unspecified foot: Secondary | ICD-10-CM | POA: Diagnosis not present

## 2017-04-12 DIAGNOSIS — M19172 Post-traumatic osteoarthritis, left ankle and foot: Secondary | ICD-10-CM

## 2017-04-12 DIAGNOSIS — M79606 Pain in leg, unspecified: Secondary | ICD-10-CM

## 2017-04-12 MED ORDER — OXYCODONE-ACETAMINOPHEN 10-325 MG PO TABS: 1.0000 | ORAL_TABLET | Freq: Two times a day (BID) | ORAL | 0 refills | Status: DC | PRN

## 2017-04-12 NOTE — Patient Instructions (Signed)
Seen for bilateral ankle pain. Reviewed findings and advised to seek orthopedic care. Patient will follow up with [facility].

## 2017-04-12 NOTE — Progress Notes (Signed)
Subjective: 61 y.o. year old female patient presents complaining of pain in left lateral ankle joint and right foot Achilles tendon area of heel. Pain has been constant daily since 2011. Pain stops with pain medication. Also having a cold and feeling bad.  Patient is diabetic and A1c was 6.9 3 months ago.  HPI: On going pain on both ankles since the day of accident, fell and broke boes while mowing lawn on 08/20/2009. Been treated with injection, and  2011 injury to right ankle, broken ankle that was repaired with screw and plate in 3419. Hardware was taken out January 2016.  Sprained left ankle 2011 with unresolved ankle pain.  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   Objective: Dermatologic: No abnormal finding. Vascular: Pedal pulses are all palpable. Warm increased temperature both ankle joint. Orthopedic: Pronating STJ bilateral. Limited ankle joint motion bilateral. Hypermobile first metatarsal bilateral. Neurologic: All epicritic and tactile sensations grossly intact.  Assessment: Osteoarthropathy both ankles. Hypermobile first ray with STJ hyperpronation bilateral. Pain ankle joint bilateral.  Treatment: Reviewed findings and available options.  Informed to patient that I cannot help her with ankle pain. Advised to seek orthopedic physician care for painful ankle joint. Patient wants to go to [facility] orth[facility] for her ankle pain. Patient request for pain medication.  As per request pain medication prescribed.

## 2017-04-15 ENCOUNTER — Other Ambulatory Visit: Payer: Self-pay

## 2017-04-15 ENCOUNTER — Emergency Department (HOSPITAL_COMMUNITY)
Admission: EM | Admit: 2017-04-15 | Discharge: 2017-04-15 | Disposition: A | Discharge status: Home / Self Care | Payer: Medicare Other | Attending: Emergency Medicine | Admitting: Emergency Medicine

## 2017-04-15 ENCOUNTER — Encounter (HOSPITAL_COMMUNITY): Payer: Self-pay | Admitting: Emergency Medicine

## 2017-04-15 ENCOUNTER — Emergency Department (HOSPITAL_COMMUNITY): Payer: Medicare Other

## 2017-04-15 DIAGNOSIS — Z7982 Long term (current) use of aspirin: Secondary | ICD-10-CM | POA: Insufficient documentation

## 2017-04-15 DIAGNOSIS — Z87891 Personal history of nicotine dependence: Secondary | ICD-10-CM | POA: Insufficient documentation

## 2017-04-15 DIAGNOSIS — Z79899 Other long term (current) drug therapy: Secondary | ICD-10-CM | POA: Insufficient documentation

## 2017-04-15 DIAGNOSIS — J189 Pneumonia, unspecified organism: Secondary | ICD-10-CM | POA: Insufficient documentation

## 2017-04-15 DIAGNOSIS — M549 Dorsalgia, unspecified: Secondary | ICD-10-CM | POA: Diagnosis present

## 2017-04-15 HISTORY — DX: Other intervertebral disc displacement, lumbar region: M51.26

## 2017-04-15 HISTORY — DX: Dorsalgia, unspecified: M54.9

## 2017-04-15 LAB — BASIC METABOLIC PANEL
Anion gap: 11 (ref 5–15)
BUN: 12 mg/dL (ref 6–20)
CO2: 24 mmol/L (ref 22–32)
Calcium: 9 mg/dL (ref 8.9–10.3)
Chloride: 104 mmol/L (ref 101–111)
Creatinine, Ser: 0.8 mg/dL (ref 0.44–1.00)
GFR calc Af Amer: >60 mL/min (ref >60)
GFR calc non Af Amer: >60 mL/min (ref >60)
Glucose, Bld: 116 mg/dL — ABNORMAL HIGH (ref 65–99)
Potassium: 4 mmol/L (ref 3.5–5.1)
Sodium: 139 mmol/L (ref 135–145)

## 2017-04-15 LAB — D-DIMER, QUANTITATIVE: D-Dimer, Quant: 0.71 ug/mL-FEU — ABNORMAL HIGH (ref 0.00–0.50)

## 2017-04-15 MED ORDER — IOPAMIDOL (ISOVUE-370) INJECTION 76%
100.0000 mL | Freq: Once | INTRAVENOUS | Status: AC | PRN
  Administered 2017-04-15: 75 mL via INTRAVENOUS

## 2017-04-15 MED ORDER — IOPAMIDOL (ISOVUE-370) INJECTION 76%
INTRAVENOUS | Status: AC
  Filled 2017-04-15: qty 100

## 2017-04-15 MED ORDER — BENZONATATE 100 MG PO CAPS: 100.0000 mg | ORAL_CAPSULE | Freq: Three times a day (TID) | ORAL | 0 refills | Status: DC | PRN

## 2017-04-15 MED ORDER — AZITHROMYCIN 250 MG PO TABS
ORAL_TABLET | ORAL | 0 refills | Status: DC
Start: 2017-04-15 — End: 2018-12-16

## 2017-04-15 MED ORDER — FLUTICASONE PROPIONATE 50 MCG/ACT NA SUSP: 1.0000 | Freq: Every day | NASAL | 2 refills | Status: DC

## 2017-04-15 NOTE — Discharge Instructions (Signed)
You were seen in the emergency department today and diagnosed with pneumonia.  I have prescribed you azithromycin, Flonase, and Tessalon.  Azithromycin is an antibiotic. Please take all of your antibiotics until finished. You may develop abdominal discomfort or diarrhea from the antibiotic.  You may help offset this with probiotics which you can buy at the store (ask your pharmacist if unable to find) or get probiotics in the form of eating yogurt. Do not eat or take the probiotics until 2 hours after your antibiotic. If you are unable to tolerate these side effects follow-up with your primary care provider or return to the emergency department.   If you begin to experience any blistering, rashes, swelling, or difficulty breathing seek medical care for evaluation of potentially more serious side effects.   Please be aware that this medication may interact with other medications you are taking, please be sure to discuss your medication list with your pharmacist.   Use the Flonase daily in each nostril, this will help with your congestion.  You can take the Tessalon every 8 hours as needed for cough.  He will need to follow-up with your primary care provider in 1 week, for further evaluation to discuss your CT scan results.  Your CT scan results are listed below to share with your primary care provider.  He will need a repeat CT scan in 6-12 months.    IMPRESSION: 1. No pulmonary embolism. 2. Small patchy consolidations within the right lower lobe, more posterior components are stable indicating chronic atelectasis. The new ill-defined consolidation, predominantly ground-glass opacity, within the medial aspects of the right lower lobe is nonspecific and could represent pneumonia, aspiration or new atelectasis (series 11, images 51 through 53). Neoplastic process is considered less likely but possible. Initial follow-up with CT at 6-12 months is recommended to confirm persistence. If persistent,  repeat CT is recommended every 2 years until 5 years of stability has been established. This recommendation follows the consensus statement: Guidelines for Management of Incidental Pulmonary Nodules Detected on CT Images: From the Fleischner Society 2017; Radiology 2017; 284:228-243. 3. Stable 5 mm pleural based nodule within the right middle lobe. Continued stability and confirmation of benignity can be ensured on the follow-up CT recommended above. 4. Lungs otherwise clear. 5. Mild biapical emphysematous change. 6. Aortic atherosclerosis.  Return to the emergency department for any new/concerning or worsening symptoms including, but not limited to difficulty breathing or chest pain.

## 2017-04-15 NOTE — ED Triage Notes (Signed)
Pt BIB [facility] from home c/o nontraumatic, thoracic back pain that started around 10a this morning. She denies injury, but states that she has been coughing over the last months and thinks that she could be sore from that. A&Ox4. Ambulatory.   140/62 Pulse 94 RR 16 95%

## 2017-04-15 NOTE — ED Provider Notes (Signed)
[facility]-EMERGENCY DEPT Provider Note   CSN: [id number] Arrival date & time: 04/15/17  1344     History   Chief Complaint Chief Complaint  Patient presents with  . Back Pain    HPI Rose [name] is a 61 y.o. female with a hx of chronic back pain and lumbar herniated disc presents with complaint of back pain that started at 10:00 this morning. Patient states pain had gradual onset, has been constant, and is located in the left upper back. Pain is worse with a deep breath, coughing, and with certain movements. States has had congestion, productive cough with white sputum, dyspnea, sore throat, and R ear pain x 1 month. Has had subjective fever and chills- no temp taken at home. No alleviating factors, has not tried anything at home. Denies injury. Denies numbness, tingling, weakness, incontinence to bowel/bladder, fever, chills, IV drug use, or hx of cancer. Patient does report hx of pulmonary embolism, feels somewhat similar with quality of worse with deep breath. Has a 47 pack year hx of tobacco use and reports quit yesterday. Currently taking Estradiol. Denies hemoptysis, leg pain/swelling, dizziness, lightheadedness, or syncope.   HPI  Past Medical History:  Diagnosis Date  . Allergy   . Asthma   . Back pain   . Chronic headaches   . Diabetes (HCC)   . Herniated lumbar intervertebral disc   . High cholesterol   . History of blood clots   . Pancreatitis   . Stroke [facility])     Patient Active Problem List   Diagnosis Date Noted  . Deformity of metatarsal bone of left foot 04/16/2015  . Pain in lower limb 03/30/2015  . Sinus tarsitis of left foot 03/30/2015  . Arthritis of ankle, left, degenerative 03/30/2015  . 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 Cigarette smoker 03/24/2015    Past Surgical History:  Procedure Laterality Date  . ANKLE SURGERY     x 3  . BREAST REDUCTION SURGERY    . CHOLECYSTECTOMY  1998  . HERNIA REPAIR    . VAGINAL HYSTERECTOMY  1988      OB History    No data available       Home Medications    Prior to Admission medications   Medication Sig Start Date End Date Taking? Authorizing Provider  aspirin EC 81 MG tablet Take 81 mg by mouth.    [provider]  clonazePAM (KLONOPIN) 1 MG tablet Take 1 mg by mouth.    [provider]  cyclobenzaprine (FLEXERIL) 10 MG tablet TK 1 T PO TID PRF MUSCLE SPASM 03/02/15   [provider]  diphenhydrAMINE (BENADRYL) 50 MG tablet Take 1 tablet (50 mg total) by mouth every 12 (twelve) hours as needed for itching. 07/27/16   Sheard, Myeong O, DPM  docusate sodium (COLACE) 100 MG capsule Take 100 mg by mouth. 06/17/14   [provider]  DULoxetine (CYMBALTA) 60 MG capsule Take 1 capsule by mouth 2 (two) times daily. 12/10/13   [provider]  estradiol (ESTRACE) 0.5 MG tablet TK 1 T PO D 01/25/15   [provider]  famotidine (PEPCID) 40 MG tablet Take 1 tablet (40 mg total) by mouth daily. 09/19/16   Rolland Porter, MD  gabapentin (NEURONTIN) 600 MG tablet TK 1 T PO TID 03/01/15   [provider]  HYDROcodone-acetaminophen (NORCO) 10-325 MG tablet Take 1 tablet by mouth 2 (two) times daily as needed. 01/10/17   Sheard, Myeong O, DPM  meloxicam (  MOBIC) 15 MG tablet TK 1 T PO D 03/11/15   [provider]  metFORMIN (GLUCOPHAGE-XR) 500 MG 24 hr tablet Take 2 tablets by mouth daily. 01/26/14   [provider]  nitroGLYCERIN (NITROSTAT) 0.4 MG SL tablet Place 0.4 mg under the tongue every 5 (five) minutes as needed for chest pain.    [provider]  nystatin-triamcinolone (MYCOLOG II) cream Apply 1 application topically 2 (two) times daily.    [provider]  omega-3 acid ethyl esters (LOVAZA) 1 G capsule TK 2 CS PO BID 01/25/15   [provider]  omeprazole (PRILOSEC) 20 MG capsule TK ONE C PO BID 03/06/15   [provider]  ondansetron (ZOFRAN ODT) 4 MG disintegrating tablet Take 1  tablet (4 mg total) by mouth every 8 (eight) hours as needed for nausea. 09/19/16   Rolland PorterJames, Mark, MD  oxyCODONE-acetaminophen (PERCOCET) 10-325 MG tablet Take 1 tablet every 12 (twelve) hours as needed by mouth for pain. 04/12/17   Sheard, Myeong O, DPM  PROAIR HFA 108 (90 Base) MCG/ACT inhaler INHALE 2 PUFFS BY MOUTH EVERY 6 HOURS AS NEEDED FOR WHEEZING OR FOR SHORTNESS OF BREATH 03/19/17   Leslye PeerByrum, Robert S, MD  sucralfate (CARAFATE) 1 g tablet Take 1 tablet (1 g total) by mouth 4 (four) times daily. 09/19/16   Rolland PorterJames, Mark, MD  traZODone (DESYREL) 100 MG tablet TK 2 TS PO QHS 03/03/15   [provider]    Family History Family History  Problem Relation Age of Onset  . Emphysema Mother   . Heart disease Mother   . Cancer Mother   . Heart disease Father   . Cancer Sister     Social History Social History   Tobacco Use  . Smoking status: Former Smoker    Packs/day: 1.00    Years: 47.00    Pack years: 47.00    Types: Cigarettes, E-cigarettes    Last attempt to quit: 04/15/2017  . Smokeless tobacco: Never Used  . Tobacco comment: 1/2 - 1 PPD -- Heavy secondhand smoke exposure  Substance Use Topics  . Alcohol use: No    Alcohol/week: 0.0 oz  . Drug use: No     Allergies   Nickel; Oxycodone; Codeine; Hydrocodone-acetaminophen; Morphine; Propoxyphene; and Quetiapine   Review of Systems Review of Systems  Constitutional: Positive for chills and fever (subjective).  HENT: Positive for congestion, ear pain and sore throat.   Eyes: Negative for visual disturbance.  Respiratory: Positive for cough and shortness of breath.   Cardiovascular: Negative for chest pain and palpitations.  Gastrointestinal: Positive for diarrhea (chronic, hx of IBS). Negative for abdominal pain, nausea and vomiting.  Genitourinary: Negative for dysuria.  Musculoskeletal: Positive for back pain.  Skin: Negative for rash.  Neurological: Negative for dizziness, syncope, weakness, light-headedness and  numbness.       Negative for incontinence to bowel/bladder  All other systems reviewed and are negative.    Physical Exam Updated Vital Signs BP 119/78 (BP Location: Right Arm)   Pulse 83   Temp 98.1 F (36.7 C) (Oral)   Resp 20   Ht 5\' 3"  (1.6 m)   Wt 72.6 kg (160 lb)   SpO2 98%   BMI 28.34 kg/m   Physical Exam  Constitutional: She appears well-developed and well-nourished.  Non-toxic appearance. No distress.  HENT:  Head: Normocephalic and atraumatic.  Right Ear: Tympanic membrane is not erythematous, not retracted and not bulging.  Left Ear: Tympanic membrane is not erythematous,  not retracted and not bulging.  Mouth/Throat: Uvula is midline and oropharynx is clear and moist. No oropharyngeal exudate or posterior oropharyngeal erythema.  Nose: nasal congestion present  Eyes: Conjunctivae are normal. Pupils are equal, round, and reactive to light. Right eye exhibits no discharge. Left eye exhibits no discharge.  Neck: Neck supple. No spinous process tenderness and no muscular tenderness present.  No cervical lymphadenopathy   Cardiovascular: Normal rate and regular rhythm. Exam reveals no gallop and no friction rub.  No murmur heard. Pulmonary/Chest: Breath sounds normal. No respiratory distress. She has no wheezes. She has no rhonchi. She has no rales.  Abdominal: Soft. She exhibits no distension. There is no tenderness. There is no rigidity, no rebound and no guarding.  Musculoskeletal:  Back: No vertebral tenderness. Left thoracic paraspinal muscle tenderness to palpation.  Lower extremities: No pedal edema. No calf tenderness.   Neurological: She is alert.  Alert. Clear speech. Bilateral upper and lower extremities' sensation intact to sharp and dull touch. 5/5 grip strength bilaterally. 5/5 plantar and dorsi flexion bilaterally. Achilles DTRs are 2+ and symmetric. Gait is normal.   Skin: Skin is warm and dry. No rash noted.  Psychiatric: She has a normal mood and  affect. Her behavior is normal.  Nursing note and vitals reviewed.    ED Treatments / Results   Results for orders placed or performed during the hospital encounter of 04/15/17  Basic metabolic panel  Result Value Ref Range   Sodium 139 135 - 145 mmol/L   Potassium 4.0 3.5 - 5.1 mmol/L   Chloride 104 101 - 111 mmol/L   CO2 24 22 - 32 mmol/L   Glucose, Bld 116 (H) 65 - 99 mg/dL   BUN 12 6 - 20 mg/dL   Creatinine, Ser 7.26 0.44 - 1.00 mg/dL   Calcium 9.0 8.9 - 20.3 mg/dL   GFR calc non Af Amer >60 >60 mL/min   GFR calc Af Amer >60 >60 mL/min   Anion gap 11 5 - 15  D-dimer, quantitative  Result Value Ref Range   D-Dimer, Quant 0.71 (H) 0.00 - 0.50 ug/mL-FEU   Dg Chest 2 View  Result Date: 04/15/2017 CLINICAL DATA:  Productive cough.  Shortness of breath.  Wheezing. EXAM: CHEST  2 VIEW COMPARISON:  CT chest 07/24/2016 FINDINGS: Heart size is normal. Lungs are clear. There is no edema or effusion. Mild rightward curvature of the midthoracic spine is noted. IMPRESSION: Negative two view chest x-ray. Electronically Signed   By: Marin Roberts M.D.   On: 04/15/2017 15:04   Ct Angio Chest Pe W/cm &/or Wo Cm  Result Date: 04/15/2017 CLINICAL DATA:  Thoracic back pain starting this morning, nontraumatic. Intermittent cough for the last few months. EXAM: CT ANGIOGRAPHY CHEST WITH CONTRAST TECHNIQUE: Multidetector CT imaging of the chest was performed using the standard protocol during bolus administration of intravenous contrast. Multiplanar CT image reconstructions and MIPs were obtained to evaluate the vascular anatomy. CONTRAST:  73mL ISOVUE-370 IOPAMIDOL (ISOVUE-370) INJECTION 76% COMPARISON:  Chest CT dated 09/16/2015. FINDINGS: Cardiovascular: There is no pulmonary embolism seen within the main, lobar or segmental pulmonary arteries bilaterally. Heart size is normal. No pericardial effusion. No aortic aneurysm or evidence of aortic dissection. Scattered aortic atherosclerosis.  Mediastinum/Nodes: No mass or enlarged lymph nodes seen within the mediastinum or perihilar regions. Esophagus appears normal. Trachea and central bronchi are unremarkable. Lungs/Pleura: Small patchy consolidations within the right lower lobe, the more posterior portions are stable compared to the previous chest  CT indicating chronic atelectasis. The new component within the medial aspects of the right lower lobe, predominantly ground-glass opacity, could be pneumonia, aspiration or new atelectasis. Left lung remains clear. Stable 5 mm pleural based nodule within the right middle lobe. Mild biapical emphysematous change grossly stable. Upper Abdomen: No acute findings. Calcifications throughout the pancreas suggesting sequela of chronic pancreatitis. Musculoskeletal: Mild degenerative spurring within the thoracic spine. No acute or suspicious osseous finding. Review of the MIP images confirms the above findings. IMPRESSION: 1. No pulmonary embolism. 2. Small patchy consolidations within the right lower lobe, more posterior components are stable indicating chronic atelectasis. The new ill-defined consolidation, predominantly ground-glass opacity, within the medial aspects of the right lower lobe is nonspecific and could represent pneumonia, aspiration or new atelectasis (series 11, images 51 through 53). Neoplastic process is considered less likely but possible. Initial follow-up with CT at 6-12 months is recommended to confirm persistence. If persistent, repeat CT is recommended every 2 years until 5 years of stability has been established. This recommendation follows the consensus statement: Guidelines for Management of Incidental Pulmonary Nodules Detected on CT Images: From the Fleischner Society 2017; Radiology 2017; 284:228-243. 3. Stable 5 mm pleural based nodule within the right middle lobe. Continued stability and confirmation of benignity can be ensured on the follow-up CT recommended above. 4. Lungs  otherwise clear. 5. Mild biapical emphysematous change. 6. Aortic atherosclerosis. Aortic Atherosclerosis (ICD10-I70.0) and Emphysema (ICD10-J43.9). Electronically Signed   By: Bary Richard M.D.   On: 04/15/2017 18:14     Initial Impression / Assessment and Plan / ED Course  I have reviewed the triage vital signs and the nursing notes.  Pertinent labs & imaging results that were available during my care of the patient were reviewed by me and considered in my medical decision making (see chart for details).  Patient presents with complaint of upper back pain and subsequently productive cough and URI sxs. She is nontoxic appearing with stable vital signs. Given personal hx of pulmonary embolism and patient reported similar quality of pain evaluated with Wells criteria for PE- patient low risk, PERC score of 2; D-dimer was ordered. D-dimer elevated- CTA was then negative for pulmonary embolism. Given CT results in combination with patient's sxs for the past month- treated for pneumonia with Azithromycin. Supportive care with Flonase and Tessalon in aim to decrease cough and subsequent back discomfort. Instructed to continue prescribed pain medications for back pain. Discussed plan of care as well as CT results with patient and need for follow-up with PCP and follow-up imaging. Discussed return precautions. Provided opportunity for questions,  Patient confirmed understanding, comfortable with discharge home.     Vitals:   04/15/17 1417 04/15/17 1841  BP: 119/78 132/84  Pulse: 83 87  Resp: 20 18  Temp: 98.1 F (36.7 C)   SpO2: 98% 97%    Final Clinical Impressions(s) / ED Diagnoses   Final diagnoses:  Community acquired pneumonia, unspecified laterality    ED Discharge Orders        Ordered    azithromycin (ZITHROMAX Z-PAK) 250 MG tablet     04/15/17 1850    benzonatate (TESSALON) 100 MG capsule  3 times daily PRN     04/15/17 1850    fluticasone (FLONASE) 50 MCG/ACT nasal spray  Daily      04/15/17 1850       Cherly Anderson, PA-C 04/15/17 1944    Mancel Bale, MD 04/16/17 [phone]

## 2017-04-15 NOTE — ED Notes (Signed)
Patient given water

## 2017-04-15 NOTE — ED Notes (Signed)
Patient provided with numbers to call cab.

## 2017-04-15 NOTE — ED Notes (Signed)
Patient transported to CT 

## 2018-02-26 DIAGNOSIS — K118 Other diseases of salivary glands: Secondary | ICD-10-CM | POA: Insufficient documentation

## 2018-02-26 DIAGNOSIS — R490 Dysphonia: Secondary | ICD-10-CM | POA: Insufficient documentation

## 2018-02-26 DIAGNOSIS — K115 Sialolithiasis: Secondary | ICD-10-CM | POA: Insufficient documentation

## 2018-07-15 ENCOUNTER — Other Ambulatory Visit: Payer: Self-pay | Admitting: Acute Care

## 2018-07-15 DIAGNOSIS — F17210 Nicotine dependence, cigarettes, uncomplicated: Secondary | ICD-10-CM

## 2018-07-15 DIAGNOSIS — Z122 Encounter for screening for malignant neoplasm of respiratory organs: Secondary | ICD-10-CM

## 2018-07-15 DIAGNOSIS — Z87891 Personal history of nicotine dependence: Secondary | ICD-10-CM

## 2018-07-30 ENCOUNTER — Inpatient Hospital Stay: Admission: RE | Admit: 2018-07-30 | Payer: Medicare Other | Source: Ambulatory Visit

## 2018-08-27 ENCOUNTER — Ambulatory Visit: Payer: Medicare HMO

## 2018-12-03 ENCOUNTER — Encounter: Payer: Self-pay | Admitting: Acute Care

## 2018-12-12 ENCOUNTER — Inpatient Hospital Stay (HOSPITAL_COMMUNITY)
Admission: RE | Admit: 2018-12-12 | Discharge: 2018-12-16 | DRG: 885 | Disposition: A | Discharge status: Home / Self Care | Payer: Medicare HMO | Attending: Psychiatry | Admitting: Psychiatry

## 2018-12-12 DIAGNOSIS — E119 Type 2 diabetes mellitus without complications: Secondary | ICD-10-CM | POA: Diagnosis not present

## 2018-12-12 DIAGNOSIS — R079 Chest pain, unspecified: Secondary | ICD-10-CM | POA: Diagnosis not present

## 2018-12-12 DIAGNOSIS — Z1159 Encounter for screening for other viral diseases: Secondary | ICD-10-CM

## 2018-12-12 DIAGNOSIS — R45851 Suicidal ideations: Secondary | ICD-10-CM | POA: Diagnosis not present

## 2018-12-12 DIAGNOSIS — R0602 Shortness of breath: Secondary | ICD-10-CM

## 2018-12-12 DIAGNOSIS — Z87891 Personal history of nicotine dependence: Secondary | ICD-10-CM

## 2018-12-12 DIAGNOSIS — F332 Major depressive disorder, recurrent severe without psychotic features: Principal | ICD-10-CM | POA: Diagnosis present

## 2018-12-12 DIAGNOSIS — J449 Chronic obstructive pulmonary disease, unspecified: Secondary | ICD-10-CM | POA: Diagnosis not present

## 2018-12-12 DIAGNOSIS — I700 Atherosclerosis of aorta: Secondary | ICD-10-CM | POA: Diagnosis not present

## 2018-12-12 DIAGNOSIS — J90 Pleural effusion, not elsewhere classified: Secondary | ICD-10-CM | POA: Diagnosis not present

## 2018-12-12 DIAGNOSIS — Z590 Homelessness: Secondary | ICD-10-CM | POA: Diagnosis not present

## 2018-12-12 DIAGNOSIS — K219 Gastro-esophageal reflux disease without esophagitis: Secondary | ICD-10-CM | POA: Diagnosis not present

## 2018-12-12 DIAGNOSIS — R918 Other nonspecific abnormal finding of lung field: Secondary | ICD-10-CM | POA: Diagnosis not present

## 2018-12-12 DIAGNOSIS — I2699 Other pulmonary embolism without acute cor pulmonale: Secondary | ICD-10-CM | POA: Diagnosis present

## 2018-12-12 MED ORDER — METFORMIN HCL ER 500 MG PO TB24
1000.0000 mg | ORAL_TABLET | Freq: Every day | ORAL | Status: DC
  Administered 2018-12-13 – 2018-12-16 (×4): 1000 mg via ORAL
  Filled 2018-12-12 (×6): qty 2

## 2018-12-12 MED ORDER — HYDROXYZINE HCL 25 MG PO TABS
25.0000 mg | ORAL_TABLET | Freq: Three times a day (TID) | ORAL | Status: DC | PRN
  Administered 2018-12-13 – 2018-12-15 (×4): 25 mg via ORAL
  Filled 2018-12-12 (×4): qty 1

## 2018-12-12 MED ORDER — ESTRADIOL 1 MG PO TABS
0.5000 mg | ORAL_TABLET | Freq: Every day | ORAL | Status: DC
  Administered 2018-12-13 – 2018-12-16 (×4): 0.5 mg via ORAL
  Filled 2018-12-12 (×6): qty 0.5

## 2018-12-12 MED ORDER — MAGNESIUM HYDROXIDE 400 MG/5ML PO SUSP: 30.0000 mL | Freq: Every day | ORAL | Status: DC | PRN

## 2018-12-12 MED ORDER — ALBUTEROL SULFATE HFA 108 (90 BASE) MCG/ACT IN AERS
1.0000 | INHALATION_SPRAY | RESPIRATORY_TRACT | Status: DC | PRN
Start: 2018-12-12 — End: 2018-12-16
  Administered 2018-12-13: 2 via RESPIRATORY_TRACT
  Filled 2018-12-12: qty 6.7

## 2018-12-12 MED ORDER — TRAZODONE HCL 100 MG PO TABS
100.0000 mg | ORAL_TABLET | Freq: Every evening | ORAL | Status: DC | PRN
  Administered 2018-12-13 (×2): 100 mg via ORAL
  Filled 2018-12-12 (×7): qty 1

## 2018-12-12 MED ORDER — DULOXETINE HCL 60 MG PO CPEP
60.0000 mg | ORAL_CAPSULE | Freq: Two times a day (BID) | ORAL | Status: DC
  Administered 2018-12-13 – 2018-12-16 (×7): 60 mg via ORAL
  Filled 2018-12-12 (×12): qty 1

## 2018-12-12 MED ORDER — ALUM & MAG HYDROXIDE-SIMETH 200-200-20 MG/5ML PO SUSP: 30.0000 mL | ORAL | Status: DC | PRN

## 2018-12-12 MED ORDER — PANTOPRAZOLE SODIUM 40 MG PO TBEC
40.0000 mg | DELAYED_RELEASE_TABLET | Freq: Every day | ORAL | Status: DC
Start: 2018-12-13 — End: 2018-12-16
  Administered 2018-12-13 – 2018-12-16 (×4): 40 mg via ORAL
  Filled 2018-12-12 (×6): qty 1

## 2018-12-12 NOTE — H&P (Signed)
Behavioral Health Medical Screening Exam  Rose [name] is an 63 y.o. female.  Total Time spent with patient: 30 minutes  Psychiatric Specialty Exam: Physical Exam  Constitutional: She is oriented to person, place, and time. She appears well-developed and well-nourished. No distress.  HENT:  Head: Normocephalic and atraumatic.  Right Ear: External ear normal.  Left Ear: External ear normal.  Eyes: Pupils are equal, round, and reactive to light. Right eye exhibits no discharge. Left eye exhibits no discharge.  Respiratory: Effort normal. No respiratory distress.  Musculoskeletal: Normal range of motion.  Neurological: She is alert and oriented to person, place, and time.  Skin: She is not diaphoretic.  Psychiatric: Her mood appears anxious. She is not withdrawn and not actively hallucinating. Thought content is not paranoid and not delusional. Cognition and memory are normal. She exhibits a depressed mood. She expresses suicidal ideation. She expresses no homicidal ideation. She expresses no suicidal plans.    Review of Systems  Constitutional: Positive for malaise/fatigue. Negative for chills, diaphoresis, fever and weight loss.  Respiratory: Negative for cough and shortness of breath.   Cardiovascular: Negative for chest pain.  Gastrointestinal: Negative for diarrhea, nausea and vomiting.  Psychiatric/Behavioral: Positive for depression and suicidal ideas. Negative for hallucinations, memory loss and substance abuse. The patient is nervous/anxious and has insomnia.     Blood pressure 118/71, pulse 78, temperature 98.4 F (36.9 C), resp. rate 18, SpO2 96 %.There is no height or weight on file to calculate BMI.  General Appearance: Disheveled  Eye Contact:  Fair  Speech:  Clear and Coherent and Normal Rate  Volume:  Decreased  Mood:  Anxious, Depressed, Hopeless and Worthless  Affect:  Congruent, Depressed and Tearful  Thought Process:  Coherent, Goal Directed and Descriptions of  Associations: Intact  Orientation:  Full (Time, Place, and Person)  Thought Content:  Logical and Hallucinations: None  Suicidal Thoughts:  Yes.  without intent/plan  Homicidal Thoughts:  No  Memory:  Immediate;   Good Recent;   Good  Judgement:  Impaired  Insight:  Fair  Psychomotor Activity:  Decreased  Concentration: Concentration: Fair and Attention Span: Fair  Recall:  Good  Fund of Knowledge:Good  Language: Good  Akathisia:  Negative  Handed:  Right  AIMS (if indicated):     Assets:  Communication Skills Desire for Improvement Leisure Time Physical Health Resilience Social Support  Sleep:       Musculoskeletal: Strength & Muscle Tone: within normal limits Gait & Station: normal Patient leans: N/A  Blood pressure 118/71, pulse 78, temperature 98.4 F (36.9 C), resp. rate 18, SpO2 96 %.  Recommendations:  Based on my evaluation the patient does not appear to have an emergency medical condition.  Rozetta Nunnery, NP 12/12/2018, 11:10 PM
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
## 2018-12-13 ENCOUNTER — Encounter (HOSPITAL_COMMUNITY): Payer: Self-pay

## 2018-12-13 ENCOUNTER — Other Ambulatory Visit: Payer: Self-pay

## 2018-12-13 ENCOUNTER — Ambulatory Visit (HOSPITAL_COMMUNITY)
Admit: 2018-12-13 | Discharge: 2018-12-13 | Disposition: A | Discharge status: Home / Self Care | Payer: Medicare HMO | Attending: Psychiatry | Admitting: Psychiatry

## 2018-12-13 DIAGNOSIS — R918 Other nonspecific abnormal finding of lung field: Secondary | ICD-10-CM | POA: Insufficient documentation

## 2018-12-13 DIAGNOSIS — J90 Pleural effusion, not elsewhere classified: Secondary | ICD-10-CM | POA: Insufficient documentation

## 2018-12-13 DIAGNOSIS — I700 Atherosclerosis of aorta: Secondary | ICD-10-CM | POA: Insufficient documentation

## 2018-12-13 DIAGNOSIS — F332 Major depressive disorder, recurrent severe without psychotic features: Principal | ICD-10-CM

## 2018-12-13 DIAGNOSIS — R079 Chest pain, unspecified: Secondary | ICD-10-CM | POA: Insufficient documentation

## 2018-12-13 LAB — CBC
HCT: 38.1 % (ref 36.0–46.0)
Hemoglobin: 12.3 g/dL (ref 12.0–15.0)
MCH: 29.5 pg (ref 26.0–34.0)
MCHC: 32.3 g/dL (ref 30.0–36.0)
MCV: 91.4 fL (ref 80.0–100.0)
Platelets: 215 10*3/uL (ref 150–400)
RBC: 4.17 MIL/uL (ref 3.87–5.11)
RDW: 14 % (ref 11.5–15.5)
WBC: 5.6 10*3/uL (ref 4.0–10.5)
nRBC: 0 % (ref 0.0–0.2)

## 2018-12-13 LAB — COMPREHENSIVE METABOLIC PANEL
ALT: 13 U/L (ref 0–44)
AST: 14 U/L — ABNORMAL LOW (ref 15–41)
Albumin: 3.7 g/dL (ref 3.5–5.0)
Alkaline Phosphatase: 100 U/L (ref 38–126)
Anion gap: 8 (ref 5–15)
BUN: 14 mg/dL (ref 8–23)
CO2: 27 mmol/L (ref 22–32)
Calcium: 8.8 mg/dL — ABNORMAL LOW (ref 8.9–10.3)
Chloride: 107 mmol/L (ref 98–111)
Creatinine, Ser: 0.95 mg/dL (ref 0.44–1.00)
GFR calc Af Amer: >60 mL/min (ref >60)
GFR calc non Af Amer: >60 mL/min (ref >60)
Glucose, Bld: 99 mg/dL (ref 70–99)
Potassium: 3.8 mmol/L (ref 3.5–5.1)
Sodium: 142 mmol/L (ref 135–145)
Total Bilirubin: 0.3 mg/dL (ref 0.3–1.2)
Total Protein: 6.7 g/dL (ref 6.5–8.1)

## 2018-12-13 LAB — SARS CORONAVIRUS 2 BY RT PCR (HOSPITAL ORDER, PERFORMED IN ~~LOC~~ HOSPITAL LAB): SARS Coronavirus 2: NEGATIVE

## 2018-12-13 LAB — LIPID PANEL
Cholesterol: 158 mg/dL (ref 0–200)
HDL: 51 mg/dL (ref >40)
LDL Cholesterol: 80 mg/dL (ref 0–99)
Total CHOL/HDL Ratio: 3.1 RATIO
Triglycerides: 133 mg/dL (ref <150)
VLDL: 27 mg/dL (ref 0–40)

## 2018-12-13 LAB — HEMOGLOBIN A1C
Hgb A1c MFr Bld: 5.4 % (ref 4.8–5.6)
Mean Plasma Glucose: 108.28 mg/dL

## 2018-12-13 LAB — TSH
TSH: 3.367 u[IU]/mL (ref 0.350–4.500)
TSH: 1.776 u[IU]/mL (ref 0.350–4.500)

## 2018-12-13 MED ORDER — CLONAZEPAM 0.5 MG PO TABS
ORAL_TABLET | ORAL | Status: AC
  Filled 2018-12-13: qty 1

## 2018-12-13 MED ORDER — IOHEXOL 350 MG/ML SOLN
100.0000 mL | Freq: Once | INTRAVENOUS | Status: AC | PRN
  Administered 2018-12-13: 100 mL via INTRAVENOUS

## 2018-12-13 MED ORDER — OMEGA-3-ACID ETHYL ESTERS 1 G PO CAPS
1.0000 g | ORAL_CAPSULE | Freq: Two times a day (BID) | ORAL | Status: DC
  Administered 2018-12-13 – 2018-12-16 (×7): 1 g via ORAL
  Filled 2018-12-13 (×11): qty 1

## 2018-12-13 MED ORDER — ALBUTEROL SULFATE (2.5 MG/3ML) 0.083% IN NEBU
2.5000 mg | INHALATION_SOLUTION | RESPIRATORY_TRACT | Status: DC | PRN
  Administered 2018-12-13: 2.5 mg via RESPIRATORY_TRACT
  Filled 2018-12-13: qty 3

## 2018-12-13 MED ORDER — UMECLIDINIUM-VILANTEROL 62.5-25 MCG/INH IN AEPB
1.0000 | INHALATION_SPRAY | Freq: Every day | RESPIRATORY_TRACT | Status: DC
  Administered 2018-12-16: 1 via RESPIRATORY_TRACT
  Filled 2018-12-13: qty 14

## 2018-12-13 MED ORDER — ROSUVASTATIN CALCIUM 20 MG PO TABS
20.0000 mg | ORAL_TABLET | Freq: Every day | ORAL | Status: DC
  Administered 2018-12-13 – 2018-12-16 (×4): 20 mg via ORAL
  Filled 2018-12-13 (×6): qty 1

## 2018-12-13 MED ORDER — SODIUM CHLORIDE (PF) 0.9 % IJ SOLN
INTRAMUSCULAR | Status: AC
  Filled 2018-12-13: qty 50

## 2018-12-13 MED ORDER — BUPROPION HCL ER (XL) 150 MG PO TB24
150.0000 mg | ORAL_TABLET | Freq: Every day | ORAL | Status: DC
  Administered 2018-12-13 – 2018-12-16 (×4): 150 mg via ORAL
  Filled 2018-12-13 (×7): qty 1

## 2018-12-13 MED ORDER — CLONAZEPAM 0.5 MG PO TABS
0.5000 mg | ORAL_TABLET | Freq: Once | ORAL | Status: AC
  Administered 2018-12-13: 0.5 mg via ORAL

## 2018-12-13 MED ORDER — LOPERAMIDE HCL 2 MG PO CAPS
2.0000 mg | ORAL_CAPSULE | ORAL | Status: DC | PRN
  Filled 2018-12-13: qty 1
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
## 2018-12-13 NOTE — H&P (Signed)
Psychiatric Admission Assessment Adult  Patient Identification: Rose [name] MRN:  [id number] Date of Evaluation:  12/13/2018 Chief Complaint:  mdd Principal Diagnosis: <principal problem not specified> Diagnosis:  Active Problems:   Severe recurrent major depression without psychotic features (HCC)  History of Present Illness: Patient is seen and examined.  Patient is a 63 year old female with a reported past psychiatric history significant for major depression and a past medical history significant for history of pulmonary emboli, pancreatitis, status post CVA, COPD, chronic back pain and diabetes who presented as a walk-in patient on 12/12/2018 to the behavioral health hospital.  The patient presented and was crying throughout the initial assessment.  She stated that that time that "I just lost, nobody cares.".  She reportedly has been homeless for the last 4 years.  Apparently her daughter lives in the [place] area, and suggested that the patient come here.  The patient stated that most recently she has been staying at a hotel, and her boyfriend of the last 4 months recently left.  She stated she is unable to cope anymore with her homelessness situation.  She apparently is on disability, and has been treated for depression in the past.  She stated she was taking Cymbalta 60 mg p.o. twice daily for chronic pain issues.  She had not been previously treated with Wellbutrin.  She stated she had been on BuSpar in the past, and "gained a lot of weight".  She admitted to helplessness, hopelessness and worthlessness.  She also has a history of pulmonary emboli, complained of shortness of breath, has continued to smoke tobacco, and is taking estrogens.  She was admitted to the hospital for evaluation and stabilization.  Associated Signs/Symptoms: Depression Symptoms:  depressed mood, anhedonia, insomnia, psychomotor agitation, fatigue, feelings of worthlessness/guilt, difficulty  concentrating, hopelessness, suicidal thoughts without plan, anxiety, loss of energy/fatigue, disturbed sleep, (Hypo) Manic Symptoms:  Impulsivity, Irritable Mood, Labiality of Mood, Anxiety Symptoms:  Excessive Worry, Psychotic Symptoms:  denied PTSD Symptoms: Negative Total Time spent with patient: 30 minutes  Past Psychiatric History: Patient stated she has been treated with psychiatric medications in the past, but is never been admitted to the hospital.  She could not recall exactly when her last appointment was with the psychiatrist.  She stated she had previously taken Cymbalta, BuSpar and other medications which she was unable to recall.  Is the patient at risk to self? Yes.    Has the patient been a risk to self in the past 6 months? No.  Has the patient been a risk to self within the distant past? No.  Is the patient a risk to others? No.  Has the patient been a risk to others in the past 6 months? No.  Has the patient been a risk to others within the distant past? No.   Prior Inpatient Therapy: Prior Inpatient Therapy: No Prior Outpatient Therapy: Prior Outpatient Therapy: No Does patient have an ACCT team?: No Does patient have Intensive In-House Services?  : No Does patient have [facility] services? : No Does patient have P4CC services?: No  Alcohol Screening: 1. How often do you have a drink containing alcohol?: Never 2. How many drinks containing alcohol do you have on a typical day when you are drinking?: 1 or 2 3. How often do you have six or more drinks on one occasion?: Never AUDIT-C Score: 0 4. How often during the last year have you found that you were not able to stop drinking once you had started?: Never 5.  How often during the last year have you failed to do what was normally expected from you becasue of drinking?: Never 6. How often during the last year have you needed a first drink in the morning to get yourself going after a heavy drinking session?:  Never 7. How often during the last year have you had a feeling of guilt of remorse after drinking?: Never 8. How often during the last year have you been unable to remember what happened the night before because you had been drinking?: Never 9. Have you or someone else been injured as a result of your drinking?: No 10. Has a relative or friend or a doctor or another health worker been concerned about your drinking or suggested you cut down?: No Alcohol Use Disorder Identification Test Final Score (AUDIT): 0 Substance Abuse History in the last 12 months:  No. Consequences of Substance Abuse: Negative Previous Psychotropic Medications: Yes  Psychological Evaluations: Yes  Past Medical History:  Past Medical History:  Diagnosis Date  . Allergy   . Asthma   . Back pain   . Chronic headaches   . Diabetes (HCC)   . Herniated lumbar intervertebral disc   . High cholesterol   . History of blood clots   . Pancreatitis   . Stroke [facility])     Past Surgical History:  Procedure Laterality Date  . ANKLE SURGERY     x 3  . BREAST REDUCTION SURGERY    . CHOLECYSTECTOMY  1998  . HERNIA REPAIR    . VAGINAL HYSTERECTOMY  1988   Family History:  Family History  Problem Relation Age of Onset  . Emphysema Mother   . Heart disease Mother   . Cancer Mother   . Heart disease Father   . Cancer Sister    Family Psychiatric  History: Noncontributory Tobacco Screening: Have you used any form of tobacco in the last 30 days? (Cigarettes, Smokeless Tobacco, Cigars, and/or Pipes): Yes Tobacco use, Select all that apply: 5 or more cigarettes per day Are you interested in Tobacco Cessation Medications?: Yes, will notify MD for an order(pt can't use the gum or Patch) Social History:  Social History   Substance and Sexual Activity  Alcohol Use No  . Alcohol/week: 0.0 standard drinks     Social History   Substance and Sexual Activity  Drug Use Yes  . Types: Marijuana    Additional Social  History: Marital status: Divorced Divorced, when?: "Back in 1993" What types of issues is patient dealing with in the relationship?: "I dont remember, I have dementia" Additional relationship information: No Are you sexually active?: No What is your sexual orientation?: Heterosexual Has your sexual activity been affected by drugs, alcohol, medication, or emotional stress?: No Does patient have children?: Yes How many children?: 3 How is patient's relationship with their children?: Reports having a good relationship with her daughter, however she reports having no relationship with her two sons.    Pain Medications: see MAR Prescriptions: see MAR Over the Counter: see MAR History of alcohol / drug use?: No history of alcohol / drug abuse                    Allergies:   Allergies  Allergen Reactions  . Nickel Rash and Shortness Of Breath  . Oxycodone Shortness Of Breath  . Codeine Swelling  . Hydrocodone-Acetaminophen Itching  . Morphine Itching  . Propoxyphene Other (See Comments)  . Quetiapine     Other reaction(s): Other (  See Comments) Restless leg syndrome   Lab Results:  Results for orders placed or performed during the hospital encounter of 12/12/18 (from the past 48 hour(s))  SARS Coronavirus 2 (CEPHEID - Performed in [facility] hospital lab), Hosp Order     Status: None   Collection Time: 12/12/18 11:15 PM   Specimen: Nasopharyngeal Swab  Result Value Ref Range   SARS Coronavirus 2 NEGATIVE NEGATIVE    Comment: (NOTE) If result is NEGATIVE SARS-CoV-2 target nucleic acids are NOT DETECTED. The SARS-CoV-2 RNA is generally detectable in upper and lower  respiratory specimens during the acute phase of infection. The lowest  concentration of SARS-CoV-2 viral copies this assay can detect is 250  copies / mL. A negative result does not preclude SARS-CoV-2 infection  and should not be used as the sole basis for treatment or other  patient management decisions.  A  negative result may occur with  improper specimen collection / handling, submission of specimen other  than nasopharyngeal swab, presence of viral mutation(s) within the  areas targeted by this assay, and inadequate number of viral copies  (<250 copies / mL). A negative result must be combined with clinical  observations, patient history, and epidemiological information. If result is POSITIVE SARS-CoV-2 target nucleic acids are DETECTED. The SARS-CoV-2 RNA is generally detectable in upper and lower  respiratory specimens dur ing the acute phase of infection.  Positive  results are indicative of active infection with SARS-CoV-2.  Clinical  correlation with patient history and other diagnostic information is  necessary to determine patient infection status.  Positive results do  not rule out bacterial infection or co-infection with other viruses. If result is PRESUMPTIVE POSTIVE SARS-CoV-2 nucleic acids MAY BE PRESENT.   A presumptive positive result was obtained on the submitted specimen  and confirmed on repeat testing.  While 2019 novel coronavirus  (SARS-CoV-2) nucleic acids may be present in the submitted sample  additional confirmatory testing may be necessary for epidemiological  and / or clinical management purposes  to differentiate between  SARS-CoV-2 and other Sarbecovirus currently known to infect humans.  If clinically indicated additional testing with an alternate test  methodology [phone]([id number]) is advised. The SARS-CoV-2 RNA is generally  detectable in upper and lower respiratory sp ecimens during the acute  phase of infection. The expected result is Negative. Fact Sheet for Patients:  BoilerBrush.com.cyhttps://www.fda.gov/media/136312/download Fact Sheet for Healthcare Providers: https://pope.com/https://www.fda.gov/media/136313/download This test is not yet approved or cleared by the [place] FDA and has been authorized for detection and/or diagnosis of SARS-CoV-2 by FDA under an Emergency Use  Authorization (EUA).  This EUA will remain in effect (meaning this test can be used) for the duration of the COVID-19 declaration under Section 564(b)(1) of the Act, 21 U.S.C. section 360bbb-3(b)(1), unless the authorization is terminated or revoked sooner. Performed at [facility], [address] [place], [place] [address]   CBC     Status: None   Collection Time: 12/13/18  6:47 AM  Result Value Ref Range   WBC 5.6 4.0 - 10.5 K/uL   RBC 4.17 3.87 - 5.11 MIL/uL   Hemoglobin 12.3 12.0 - 15.0 g/dL   HCT 46.938.1 62.936.0 - 52.846.0 %   MCV 91.4 80.0 - 100.0 fL   MCH 29.5 26.0 - 34.0 pg   MCHC 32.3 30.0 - 36.0 g/dL   RDW 41.314.0 24.411.5 - 01.015.5 %   Platelets 215 150 - 400 K/uL   nRBC 0.0 0.0 - 0.2 %  Comment: Performed at [facility], [place] [address], [place], [place] [address]  Comprehensive metabolic panel     Status: Abnormal   Collection Time: 12/13/18  6:47 AM  Result Value Ref Range   Sodium 142 135 - 145 mmol/L   Potassium 3.8 3.5 - 5.1 mmol/L   Chloride 107 98 - 111 mmol/L   CO2 27 22 - 32 mmol/L   Glucose, Bld 99 70 - 99 mg/dL   BUN 14 8 - 23 mg/dL   Creatinine, Ser 0.95 0.44 - 1.00 mg/dL   Calcium 8.8 (L) 8.9 - 10.3 mg/dL   Total Protein 6.7 6.5 - 8.1 g/dL   Albumin 3.7 3.5 - 5.0 g/dL   AST 14 (L) 15 - 41 U/L   ALT 13 0 - 44 U/L   Alkaline Phosphatase 100 38 - 126 U/L   Total Bilirubin 0.3 0.3 - 1.2 mg/dL   GFR calc non Af Amer >60 >60 mL/min   GFR calc Af Amer >60 >60 mL/min   Anion gap 8 5 - 15    Comment: Performed at [facility], [place] [address] [place], [place] [address]  Hemoglobin A1c     Status: None   Collection Time: 12/13/18  6:47 AM  Result Value Ref Range   Hgb A1c MFr Bld 5.4 4.8 - 5.6 %    Comment: (NOTE) Pre diabetes:          5.7%-6.4% Diabetes:              >6.4% Glycemic control for   <7.0% adults with diabetes    Mean Plasma Glucose 108.28 mg/dL    Comment: Performed at [place] [address] [place], [place] [address]  Lipid panel     Status: None   Collection Time: 12/13/18  6:47 AM  Result Value Ref Range   Cholesterol 158 0 - 200 mg/dL   Triglycerides 133 <150 mg/dL   HDL 51 >40 mg/dL   Total CHOL/HDL Ratio 3.1 RATIO   VLDL 27 0 - 40 mg/dL   LDL Cholesterol 80 0 - 99 mg/dL    Comment:        Total Cholesterol/HDL:CHD Risk Coronary Heart Disease Risk Table                     Men   Women  1/2 Average Risk   3.4   3.3  Average Risk       5.0   4.4  2 X Average Risk   9.6   7.1  3 X Average Risk  23.4   11.0        Use the calculated Patient Ratio above and the CHD Risk Table to determine the patient's CHD Risk.        ATP III CLASSIFICATION (LDL):  <100     mg/dL   Optimal  100-129  mg/dL   Near or Above                    Optimal  130-159  mg/dL   Borderline  160-189  mg/dL   High  >190     mg/dL   Very High Performed at [place] [address], [place],  [address]   TSH     Status: None   Collection Time: 12/13/18  6:47 AM  Result Value Ref Range   TSH 3.367 0.350 - 4.500 uIU/mL    Comment: Performed  by a 3rd Generation assay with a functional sensitivity of <=0.01 uIU/mL. Performed at [facility], [address] [place], [place] [address]     Blood Alcohol level:  No results found for: [facility]  Metabolic Disorder Labs:  Lab Results  Component Value Date   HGBA1C 5.4 12/13/2018   MPG 108.28 12/13/2018   No results found for: PROLACTIN Lab Results  Component Value Date   CHOL 158 12/13/2018   TRIG 133 12/13/2018   HDL 51 12/13/2018   CHOLHDL 3.1 12/13/2018   VLDL 27 12/13/2018   LDLCALC 80 12/13/2018    Current Medications: Current Facility-Administered Medications  Medication Dose Route Frequency Provider Last Rate Last Dose  . albuterol (VENTOLIN HFA) 108 (90 Base) MCG/ACT inhaler 1-2 puff  1-2 puff Inhalation Q4H PRN Nira Conn A, NP   2 puff at 12/13/18 1122  . alum & mag  hydroxide-simeth (MAALOX/MYLANTA) 200-200-20 MG/5ML suspension 30 mL  30 mL Oral Q4H PRN Nira Conn A, NP      . buPROPion (WELLBUTRIN XL) 24 hr tablet 150 mg  150 mg Oral Daily Antonieta Pert, MD   150 mg at 12/13/18 1123  . clonazePAM (KLONOPIN) 0.5 MG tablet           . DULoxetine (CYMBALTA) DR capsule 60 mg  60 mg Oral BID Nira Conn A, NP   60 mg at 12/13/18 1121  . estradiol (ESTRACE) tablet 0.5 mg  0.5 mg Oral Daily Nira Conn A, NP   0.5 mg at 12/13/18 1122  . hydrOXYzine (ATARAX/VISTARIL) tablet 25 mg  25 mg Oral TID PRN Jackelyn Poling, NP   25 mg at 12/13/18 0107  . loperamide (IMODIUM) capsule 2 mg  2 mg Oral PRN Nira Conn A, NP      . magnesium hydroxide (MILK OF MAGNESIA) suspension 30 mL  30 mL Oral Daily PRN Nira Conn A, NP      . metFORMIN (GLUCOPHAGE-XR) 24 hr tablet 1,000 mg  1,000 mg Oral Q breakfast Nira Conn A, NP   1,000 mg at 12/13/18 1121  . omega-3 acid ethyl esters (LOVAZA) capsule 1 g  1 g Oral BID Antonieta Pert, MD   1 g at 12/13/18 1130  . pantoprazole (PROTONIX) EC tablet 40 mg  40 mg Oral Daily Nira Conn A, NP   40 mg at 12/13/18 1121  . rosuvastatin (CRESTOR) tablet 20 mg  20 mg Oral Daily Nira Conn A, NP   20 mg at 12/13/18 1130  . traZODone (DESYREL) tablet 100 mg  100 mg Oral QHS,MR X 1 Nira Conn A, NP   100 mg at 12/13/18 0107  . umeclidinium-vilanterol (ANORO ELLIPTA) 62.5-25 MCG/INH 1 puff  1 puff Inhalation Daily Jackelyn Poling, NP       PTA Medications: Facility-Administered Medications Prior to Admission  Medication Dose Route Frequency Provider Last Rate Last Dose  . triamcinolone acetonide (KENALOG) 10 MG/ML injection 10 mg  10 mg Other Once Sheard, Myeong O, DPM       Medications Prior to Admission  Medication Sig Dispense Refill Last Dose  . ANORO ELLIPTA 62.5-25 MCG/INH AEPB Inhale 1 puff into the lungs daily.     Marland Kitchen aspirin EC 81 MG tablet Take 81 mg by mouth.     Marland Kitchen azithromycin (ZITHROMAX Z-PAK) 250 MG tablet Take  2 tablets the first day of taking this antibiotic then take 1 pill per day until complete. 6 tablet 0   . benzonatate (  TESSALON) 100 MG capsule Take 1 capsule (100 mg total) 3 (three) times daily as needed by mouth for cough. 21 capsule 0   . clonazePAM (KLONOPIN) 1 MG tablet Take 1 mg by mouth.     . cyclobenzaprine (FLEXERIL) 10 MG tablet TK 1 T PO TID PRF MUSCLE SPASM  0   . diphenhydrAMINE (BENADRYL) 50 MG tablet Take 1 tablet (50 mg total) by mouth every 12 (twelve) hours as needed for itching. 60 tablet 0   . docusate sodium (COLACE) 100 MG capsule Take 100 mg by mouth.     . DULoxetine (CYMBALTA) 60 MG capsule Take 1 capsule by mouth 2 (two) times daily.     Marland Kitchen estradiol (ESTRACE) 0.5 MG tablet TK 1 T PO D  1   . famotidine (PEPCID) 40 MG tablet Take 1 tablet (40 mg total) by mouth daily. 30 tablet 0   . fluticasone (FLONASE) 50 MCG/ACT nasal spray Place 1 spray daily into both nostrils. 16 g 2   . gabapentin (NEURONTIN) 600 MG tablet TK 1 T PO TID  0   . HYDROcodone-acetaminophen (NORCO) 10-325 MG tablet Take 1 tablet by mouth 2 (two) times daily as needed. 60 tablet 0   . meloxicam (MOBIC) 15 MG tablet TK 1 T PO D  0   . metFORMIN (GLUCOPHAGE-XR) 500 MG 24 hr tablet Take 2 tablets by mouth daily.     . nitroGLYCERIN (NITROSTAT) 0.4 MG SL tablet Place 0.4 mg under the tongue every 5 (five) minutes as needed for chest pain.     Marland Kitchen nystatin-triamcinolone (MYCOLOG II) cream Apply 1 application topically 2 (two) times daily.     Marland Kitchen omega-3 acid ethyl esters (LOVAZA) 1 G capsule TK 2 CS PO BID  6   . omeprazole (PRILOSEC) 20 MG capsule TK ONE C PO BID  6   . ondansetron (ZOFRAN ODT) 4 MG disintegrating tablet Take 1 tablet (4 mg total) by mouth every 8 (eight) hours as needed for nausea. 6 tablet 0   . oxyCODONE-acetaminophen (PERCOCET) 10-325 MG tablet Take 1 tablet every 12 (twelve) hours as needed by mouth for pain. 60 tablet 0   . PROAIR HFA 108 (90 Base) MCG/ACT inhaler INHALE 2 PUFFS BY  MOUTH EVERY 6 HOURS AS NEEDED FOR WHEEZING OR FOR SHORTNESS OF BREATH 9 each 0   . rosuvastatin (CRESTOR) 20 MG tablet Take 20 mg by mouth daily.     . sucralfate (CARAFATE) 1 g tablet Take 1 tablet (1 g total) by mouth 4 (four) times daily. 60 tablet 0   . traZODone (DESYREL) 100 MG tablet TK 2 TS PO QHS  1     Musculoskeletal: Strength & Muscle Tone: within normal limits Gait & Station: normal Patient leans: N/A  Psychiatric Specialty Exam: Physical Exam  Nursing note and vitals reviewed. Constitutional: She is oriented to person, place, and time. She appears well-developed and well-nourished.  HENT:  Head: Normocephalic and atraumatic.  Respiratory: Effort normal.  Neurological: She is alert and oriented to person, place, and time.    ROS  Blood pressure 139/74, pulse 89, temperature 98.4 F (36.9 C), resp. rate 18, height  (1.6 m), weight 74.8 kg, SpO2 95 %.Body mass index is 29.23 kg/m.  General Appearance: Disheveled  Eye Contact:  Poor  Speech:  Normal Rate  Volume:  Decreased  Mood:  Anxious and Depressed  Affect:  Congruent  Thought Process:  Coherent and Descriptions of Associations: Circumstantial  Orientation:  Full (  Time, Place, and Person)  Thought Content:  Logical  Suicidal Thoughts:  No  Homicidal Thoughts:  No  Memory:  Immediate;   Fair Recent;   Fair Remote;   Fair  Judgement:  Intact  Insight:  Fair  Psychomotor Activity:  Increased  Concentration:  Concentration: Fair and Attention Span: Fair  Recall:  FiservFair  Fund of Knowledge:  Fair  Language:  Fair  Akathisia:  Negative  Handed:  Right  AIMS (if indicated):     Assets:  Desire for Improvement Resilience  ADL's:  Intact  Cognition:  WNL  Sleep:  Number of Hours: 2.5(new admit )    Treatment Plan Summary: Daily contact with patient to assess and evaluate symptoms and progress in treatment, Medication management and Plan : Patient is seen and examined.  Patient is a 63 year old female  with the above-stated past medical and psychiatric history who was admitted to the hospital secondary to worsening depression.  She will be admitted to the hospital.  She will be integrated into the milieu.  She will be encouraged to attend groups.  She is already on Cymbalta 60 mg p.o. twice daily for her fibromyalgia pain, and this will be continued.  Wellbutrin XL 150 mg p.o. daily will be added for augmentation treatment.  She denied any previous history of seizure disorder.  She was reportedly supposed to get a repeat CT scan of the chest done in February for cancer screening as well as follow-up with her pulmonary emboli.  She does continue to complain of shortness of breath, continues to smoke cigarettes, is not on any anticoagulation, and is taking estrogens.  She on physical examination has expiratory wheezing which may be secondary to her COPD, but could be recurrence of pulmonary emboli.  We will order the CT scan today.  She receives Klonopin and several other medications from her primary care provider, and these will be continued during the course the hospitalization.  Her laboratories were reviewed, and were all essentially normal.  Drug screen has yet to be obtained, but she denied any active drug use outside of marijuana.  Her homelessness is a major issue, and this will be discussed during the course the hospitalization.  Observation Level/Precautions:  15 minute checks  Laboratory:  Chemistry Profile  Psychotherapy:    Medications:    Consultations:    Discharge Concerns:    Estimated LOS:  Other:     Physician Treatment Plan for Primary Diagnosis: <principal problem not specified> Long Term Goal(s): Improvement in symptoms so as ready for discharge  Short Term Goals: Ability to identify changes in lifestyle to reduce recurrence of condition will improve, Ability to verbalize feelings will improve, Ability to disclose and discuss suicidal ideas, Ability to demonstrate self-control  will improve, Ability to identify and develop effective coping behaviors will improve, Ability to maintain clinical measurements within normal limits will improve and Compliance with prescribed medications will improve  Physician Treatment Plan for Secondary Diagnosis: Active Problems:   Severe recurrent major depression without psychotic features (HCC)  Long Term Goal(s): Improvement in symptoms so as ready for discharge  Short Term Goals: Ability to identify changes in lifestyle to reduce recurrence of condition will improve, Ability to verbalize feelings will improve, Ability to disclose and discuss suicidal ideas, Ability to demonstrate self-control will improve, Ability to identify and develop effective coping behaviors will improve, Ability to maintain clinical measurements within normal limits will improve and Compliance with prescribed medications will improve  I certify that inpatient  services furnished can reasonably be expected to improve the patient's condition.    Antonieta Pert, MD 7/17/202011:33 AM

## 2018-12-13 NOTE — BHH Suicide Risk Assessment (Signed)
[facility] Admission Suicide Risk Assessment   Nursing information obtained from:  Patient Demographic factors:  Unemployed, Low socioeconomic status Current Mental Status:  NA Loss Factors:  Financial problems / change in socioeconomic status, Legal issues, Decline in physical health Historical Factors:  NA Risk Reduction Factors:  Positive social support  Total Time spent with patient: 30 minutes Principal Problem: <principal problem not specified> Diagnosis:  Active Problems:   Severe recurrent major depression without psychotic features (HCC)  Subjective Data: Patient is seen and examined.  Patient is a 63 year old female with a reported past psychiatric history significant for major depression and a past medical history significant for history of pulmonary emboli, pancreatitis, status post CVA, COPD, chronic back pain and diabetes who presented as a walk-in patient on 12/12/2018 to the behavioral health hospital.  The patient presented and was crying throughout the initial assessment.  She stated that that time that "I just lost, nobody cares.".  She reportedly has been homeless for the last 4 years.  Apparently her daughter lives in the [place] area, and suggested that the patient come here.  The patient stated that most recently she has been staying at a hotel, and her boyfriend of the last 4 months recently left.  She stated she is unable to cope anymore with her homelessness situation.  She apparently is on disability, and has been treated for depression in the past.  She stated she was taking Cymbalta 60 mg p.o. twice daily for chronic pain issues.  She had not been previously treated with Wellbutrin.  She stated she had been on BuSpar in the past, and "gained a lot of weight".  She admitted to helplessness, hopelessness and worthlessness.  She also has a history of pulmonary emboli, complained of shortness of breath, has continued to smoke tobacco, and is taking estrogens.  She was admitted to  the hospital for evaluation and stabilization.  Continued Clinical Symptoms:  Alcohol Use Disorder Identification Test Final Score (AUDIT): 0 The "Alcohol Use Disorders Identification Test", Guidelines for Use in Primary Care, Second Edition.  World Pharmacologist [facility]). Score between 0-7:  no or low risk or alcohol related problems. Score between 8-15:  moderate risk of alcohol related problems. Score between 16-19:  high risk of alcohol related problems. Score 20 or above:  warrants further diagnostic evaluation for alcohol dependence and treatment.   CLINICAL FACTORS:   Depression:   Anhedonia Hopelessness Impulsivity Insomnia Previous Psychiatric Diagnoses and Treatments Medical Diagnoses and Treatments/Surgeries   Musculoskeletal: Strength & Muscle Tone: decreased Gait & Station: shuffle Patient leans: N/A  Psychiatric Specialty Exam: Physical Exam  Nursing note and vitals reviewed. Constitutional: She is oriented to person, place, and time. She appears well-developed and well-nourished.  HENT:  Head: Normocephalic and atraumatic.  Respiratory: Effort normal. She has wheezes. She exhibits tenderness.  Neurological: She is alert and oriented to person, place, and time.    ROS  Blood pressure 139/74, pulse 89, temperature 98.4 F (36.9 C), resp. rate 18, height 5\' 3"  (1.6 m), weight 74.8 kg, SpO2 95 %.Body mass index is 29.23 kg/m.  General Appearance: Disheveled  Eye Contact:  Poor  Speech:  Normal Rate  Volume:  Decreased  Mood:  Depressed  Affect:  Congruent  Thought Process:  Coherent and Descriptions of Associations: Intact  Orientation:  Full (Time, Place, and Person)  Thought Content:  Logical  Suicidal Thoughts:  Yes.  without intent/plan  Homicidal Thoughts:  No  Memory:  Immediate;   Fair Recent;  Fair Remote;   Fair  Judgement:  Impaired  Insight:  Fair  Psychomotor Activity:  Psychomotor Retardation  Concentration:  Concentration: Fair and  Attention Span: Fair  Recall:  [employer] of Knowledge:  Fair  Language:  Fair  Akathisia:  Negative  Handed:  Right  AIMS (if indicated):     Assets:  Desire for Improvement Resilience  ADL's:  Impaired  Cognition:  WNL  Sleep:  Number of Hours: 2.5(new admit )      COGNITIVE FEATURES THAT CONTRIBUTE TO RISK:  None    SUICIDE RISK:   Moderate:  Frequent suicidal ideation with limited intensity, and duration, some specificity in terms of plans, no associated intent, good self-control, limited dysphoria/symptomatology, some risk factors present, and identifiable protective factors, including available and accessible social support.  PLAN OF CARE: Patient is seen and examined.  Patient is a 63 year old female with the above-stated past medical and psychiatric history who was admitted to the hospital secondary to worsening depression.  She will be admitted to the hospital.  She will be integrated into the milieu.  She will be encouraged to attend groups.  She is already on Cymbalta 60 mg p.o. twice daily for her fibromyalgia pain, and this will be continued.  Wellbutrin XL 150 mg p.o. daily will be added for augmentation treatment.  She denied any previous history of seizure disorder.  She was reportedly supposed to get a repeat CT scan of the chest done in February for cancer screening as well as follow-up with her pulmonary emboli.  She does continue to complain of shortness of breath, continues to smoke cigarettes, is not on any anticoagulation, and is taking estrogens.  She on physical examination has expiratory wheezing which may be secondary to her COPD, but could be recurrence of pulmonary emboli.  We will order the CT scan today.  She receives Klonopin and several other medications from her primary care provider, and these will be continued during the course the hospitalization.  Her laboratories were reviewed, and were all essentially normal.  Drug screen has yet to be obtained, but she  denied any active drug use outside of marijuana.  Her homelessness is a major issue, and this will be discussed during the course the hospitalization.  I certify that inpatient services furnished can reasonably be expected to improve the patient's condition.   Antonieta Pert, MD 12/13/2018, 10:06 AM

## 2018-12-13 NOTE — Progress Notes (Signed)
[place] NOVEL CORONAVIRUS (COVID-19) DAILY CHECK-OFF SYMPTOMS - answer yes or no to each - every day NO YES  Have you had a fever in the past 24 hours?  . Fever (Temp > 37.80C / 100F) X   Have you had any of these symptoms in the past 24 hours? . New Cough .  Sore Throat  .  Shortness of Breath .  Difficulty Breathing .  Unexplained Body Aches   X   Have you had any one of these symptoms in the past 24 hours not related to allergies?   . Runny Nose .  Nasal Congestion .  Sneezing   X   If you have had runny nose, nasal congestion, sneezing in the past 24 hours, has it worsened?  X   EXPOSURES - check yes or no X   Have you traveled outside the state in the past 14 days?  X   Have you been in contact with someone with a confirmed diagnosis of COVID-19 or PUI in the past 14 days without wearing appropriate PPE?  X   Have you been living in the same home as a person with confirmed diagnosis of COVID-19 or a PUI (household contact)?    X   Have you been diagnosed with COVID-19?    X              What to do next: Answered NO to all: Answered YES to anything:   Proceed with unit schedule Follow the BHS Inpatient Flowsheet.   

## 2018-12-13 NOTE — Tx Team (Signed)
Initial Treatment Plan 12/13/2018 2:20 AM [name] IRW:[id number]    PATIENT STRESSORS: Financial difficulties Health problems Legal issue   PATIENT STRENGTHS: General fund of knowledge Supportive family/friends   PATIENT IDENTIFIED PROBLEMS: Risk for suicide  Depression  "work on myself , my attitude"                 DISCHARGE CRITERIA:  Ability to meet basic life and health needs Adequate post-discharge living arrangements Improved stabilization in mood, thinking, and/or behavior Verbal commitment to aftercare and medication compliance  PRELIMINARY DISCHARGE PLAN: Attend aftercare/continuing care group Attend PHP/IOP Outpatient therapy Placement in alternative living arrangements  PATIENT/FAMILY INVOLVEMENT: This treatment plan has been presented to and reviewed with the patient, [employer],.  The patient and family have been given the opportunity to ask questions and make suggestions.  Providence Crosby, RN 12/13/2018, 2:20 AM

## 2018-12-13 NOTE — Plan of Care (Signed)
  Problem: Coping: Goal: Will verbalize feelings Outcome: Progressing   

## 2018-12-13 NOTE — Tx Team (Signed)
Interdisciplinary Treatment and Diagnostic Plan Update  12/13/2018 Time of Session:  Rose [name] MRN: [id number]  Principal Diagnosis: <principal problem not specified>  Secondary Diagnoses: Active Problems:   Severe recurrent major depression without psychotic features (HCC)   Current Medications:  Current Facility-Administered Medications  Medication Dose Route Frequency Provider Last Rate Last Dose  . albuterol (VENTOLIN HFA) 108 (90 Base) MCG/ACT inhaler 1-2 puff  1-2 puff Inhalation Q4H PRN Lindon Romp A, NP      . alum & mag hydroxide-simeth (MAALOX/MYLANTA) 200-200-20 MG/5ML suspension 30 mL  30 mL Oral Q4H PRN Lindon Romp A, NP      . buPROPion (WELLBUTRIN XL) 24 hr tablet 150 mg  150 mg Oral Daily Sharma Covert, MD      . clonazePAM (KLONOPIN) 0.5 MG tablet           . DULoxetine (CYMBALTA) DR capsule 60 mg  60 mg Oral BID Lindon Romp A, NP      . estradiol (ESTRACE) tablet 0.5 mg  0.5 mg Oral Daily Lindon Romp A, NP      . hydrOXYzine (ATARAX/VISTARIL) tablet 25 mg  25 mg Oral TID PRN Lindon Romp A, NP   25 mg at 12/13/18 0107  . loperamide (IMODIUM) capsule 2 mg  2 mg Oral PRN Lindon Romp A, NP      . magnesium hydroxide (MILK OF MAGNESIA) suspension 30 mL  30 mL Oral Daily PRN Lindon Romp A, NP      . metFORMIN (GLUCOPHAGE-XR) 24 hr tablet 1,000 mg  1,000 mg Oral Q breakfast Lindon Romp A, NP      . omega-3 acid ethyl esters (LOVAZA) capsule 1 g  1 g Oral BID Sharma Covert, MD      . pantoprazole (PROTONIX) EC tablet 40 mg  40 mg Oral Daily Lindon Romp A, NP      . rosuvastatin (CRESTOR) tablet 20 mg  20 mg Oral Daily Lindon Romp A, NP      . traZODone (DESYREL) tablet 100 mg  100 mg Oral QHS,MR X 1 Lindon Romp A, NP   100 mg at 12/13/18 0107  . umeclidinium-vilanterol (ANORO ELLIPTA) 62.5-25 MCG/INH 1 puff  1 puff Inhalation Daily Rozetta Nunnery, NP       PTA Medications: Facility-Administered Medications Prior to Admission  Medication Dose Route  Frequency Provider Last Rate Last Dose  . triamcinolone acetonide (KENALOG) 10 MG/ML injection 10 mg  10 mg Other Once Sheard, Myeong O, DPM       Medications Prior to Admission  Medication Sig Dispense Refill Last Dose  . ANORO ELLIPTA 62.5-25 MCG/INH AEPB Inhale 1 puff into the lungs daily.     Marland Kitchen aspirin EC 81 MG tablet Take 81 mg by mouth.     Marland Kitchen azithromycin (ZITHROMAX Z-PAK) 250 MG tablet Take 2 tablets the first day of taking this antibiotic then take 1 pill per day until complete. 6 tablet 0   . benzonatate (TESSALON) 100 MG capsule Take 1 capsule (100 mg total) 3 (three) times daily as needed by mouth for cough. 21 capsule 0   . clonazePAM (KLONOPIN) 1 MG tablet Take 1 mg by mouth.     . cyclobenzaprine (FLEXERIL) 10 MG tablet TK 1 T PO TID PRF MUSCLE SPASM  0   . diphenhydrAMINE (BENADRYL) 50 MG tablet Take 1 tablet (50 mg total) by mouth every 12 (twelve) hours as needed for itching. 60 tablet 0   . docusate sodium (COLACE)  100 MG capsule Take 100 mg by mouth.     . DULoxetine (CYMBALTA) 60 MG capsule Take 1 capsule by mouth 2 (two) times daily.     Marland Kitchen estradiol (ESTRACE) 0.5 MG tablet TK 1 T PO D  1   . famotidine (PEPCID) 40 MG tablet Take 1 tablet (40 mg total) by mouth daily. 30 tablet 0   . fluticasone (FLONASE) 50 MCG/ACT nasal spray Place 1 spray daily into both nostrils. 16 g 2   . gabapentin (NEURONTIN) 600 MG tablet TK 1 T PO TID  0   . HYDROcodone-acetaminophen (NORCO) 10-325 MG tablet Take 1 tablet by mouth 2 (two) times daily as needed. 60 tablet 0   . meloxicam (MOBIC) 15 MG tablet TK 1 T PO D  0   . metFORMIN (GLUCOPHAGE-XR) 500 MG 24 hr tablet Take 2 tablets by mouth daily.     . nitroGLYCERIN (NITROSTAT) 0.4 MG SL tablet Place 0.4 mg under the tongue every 5 (five) minutes as needed for chest pain.     Marland Kitchen nystatin-triamcinolone (MYCOLOG II) cream Apply 1 application topically 2 (two) times daily.     Marland Kitchen omega-3 acid ethyl esters (LOVAZA) 1 G capsule TK 2 CS PO BID  6    . omeprazole (PRILOSEC) 20 MG capsule TK ONE C PO BID  6   . ondansetron (ZOFRAN ODT) 4 MG disintegrating tablet Take 1 tablet (4 mg total) by mouth every 8 (eight) hours as needed for nausea. 6 tablet 0   . oxyCODONE-acetaminophen (PERCOCET) 10-325 MG tablet Take 1 tablet every 12 (twelve) hours as needed by mouth for pain. 60 tablet 0   . PROAIR HFA 108 (90 Base) MCG/ACT inhaler INHALE 2 PUFFS BY MOUTH EVERY 6 HOURS AS NEEDED FOR WHEEZING OR FOR SHORTNESS OF BREATH 9 each 0   . rosuvastatin (CRESTOR) 20 MG tablet Take 20 mg by mouth daily.     . sucralfate (CARAFATE) 1 g tablet Take 1 tablet (1 g total) by mouth 4 (four) times daily. 60 tablet 0   . traZODone (DESYREL) 100 MG tablet TK 2 TS PO QHS  1     Patient Stressors: Financial difficulties Health problems Legal issue  Patient Strengths: General fund of knowledge Supportive family/friends  Treatment Modalities: Medication Management, Group therapy, Case management,  1 to 1 session with clinician, Psychoeducation, Recreational therapy.   Physician Treatment Plan for Primary Diagnosis: <principal problem not specified> Long Term Goal(s):     Short Term Goals:    Medication Management: Evaluate patient's response, side effects, and tolerance of medication regimen.  Therapeutic Interventions: 1 to 1 sessions, Unit Group sessions and Medication administration.  Evaluation of Outcomes: Not Met  Physician Treatment Plan for Secondary Diagnosis: Active Problems:   Severe recurrent major depression without psychotic features ([place])  Long Term Goal(s):     Short Term Goals:       Medication Management: Evaluate patient's response, side effects, and tolerance of medication regimen.  Therapeutic Interventions: 1 to 1 sessions, Unit Group sessions and Medication administration.  Evaluation of Outcomes: Not Met   RN Treatment Plan for Primary Diagnosis: <principal problem not specified> Long Term Goal(s): Knowledge of disease  and therapeutic regimen to maintain health will improve  Short Term Goals: Ability to participate in decision making will improve, Ability to verbalize feelings will improve, Ability to disclose and discuss suicidal ideas, Ability to identify and develop effective coping behaviors will improve and Compliance with prescribed medications will improve  Medication Management: RN will administer medications as ordered by provider, will assess and evaluate patient's response and provide education to patient for prescribed medication. RN will report any adverse and/or side effects to prescribing provider.  Therapeutic Interventions: 1 on 1 counseling sessions, Psychoeducation, Medication administration, Evaluate responses to treatment, Monitor vital signs and CBGs as ordered, Perform/monitor CIWA, COWS, AIMS and Fall Risk screenings as ordered, Perform wound care treatments as ordered.  Evaluation of Outcomes: Not Met   LCSW Treatment Plan for Primary Diagnosis: <principal problem not specified> Long Term Goal(s): Safe transition to appropriate next level of care at discharge, Engage patient in therapeutic group addressing interpersonal concerns.  Short Term Goals: Engage patient in aftercare planning with referrals and resources  Therapeutic Interventions: Assess for all discharge needs, 1 to 1 time with Social worker, Explore available resources and support systems, Assess for adequacy in community support network, Educate family and significant other(s) on suicide prevention, Complete Psychosocial Assessment, Interpersonal group therapy.  Evaluation of Outcomes: Not Met   Progress in Treatment: Attending groups: No. New to unit  Participating in groups: No. Taking medication as prescribed: Yes. Toleration medication: Yes. Family/Significant other contact made: No, will contact:  the patient's daughter Patient understands diagnosis: Yes. Discussing patient identified problems/goals with staff:  Yes. Medical problems stabilized or resolved: Yes. Denies suicidal/homicidal ideation: No. Issues/concerns per patient self-inventory: Yes. Other:   New problem(s) identified: None   New Short Term/Long Term Goal(s): medication stabilization, elimination of SI thoughts, development of comprehensive mental wellness plan.    Patient Goals:     Discharge Plan or Barriers: Patient recently admitted. CSW will continue to follow and assess for appropriate referrals and possible discharge planning.    Reason for Continuation of Hospitalization: Anxiety Depression Medication stabilization Suicidal ideation  Estimated Length of Stay: 3-5 days   Attendees: Patient: 12/13/2018 10:30 AM  Physician: Dr. Myles Lipps, MD 12/13/2018 10:30 AM  Nursing: Sharl Ma.Viona Gilmore, RN  12/13/2018 10:30 AM  RN Care Manager: 12/13/2018 10:30 AM  Social Worker: Radonna Ricker, [place] 12/13/2018 10:30 AM  Recreational Therapist:  12/13/2018 10:30 AM  Other:  12/13/2018 10:30 AM  Other:  12/13/2018 10:30 AM  Other: 12/13/2018 10:30 AM    Scribe for Treatment Team: Marylee Floras, [place] 12/13/2018 10:30 AM
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
## 2018-12-13 NOTE — Progress Notes (Signed)
Pt presents with a flat affect and depressed mood. Pt noted to be irritable on approach and throughout the day. Pt denies SI/HI. Pt expressed needing housing because she's homeless. Pt c/o nonproductive cough and SOB. MD made aware and the pt was given an albuterol Nebulizer. The pt was sent to CT this am, per MD for a chest CT. The pt expressed that she's been urinating on herself today. The pt was provided with pads and disposable underwear. Pt linen was changed.  Orders reviewed with pt. Verbal support provided. Pt encouraged to attend groups. 15 minute checks performed for safety.  Pt compliant with tx plan.

## 2018-12-14 LAB — BASIC METABOLIC PANEL
Anion gap: 10 (ref 5–15)
BUN: 16 mg/dL (ref 8–23)
CO2: 24 mmol/L (ref 22–32)
Calcium: 9 mg/dL (ref 8.9–10.3)
Chloride: 104 mmol/L (ref 98–111)
Creatinine, Ser: 0.8 mg/dL (ref 0.44–1.00)
GFR calc Af Amer: >60 mL/min (ref >60)
GFR calc non Af Amer: >60 mL/min (ref >60)
Glucose, Bld: 92 mg/dL (ref 70–99)
Potassium: 4 mmol/L (ref 3.5–5.1)
Sodium: 138 mmol/L (ref 135–145)

## 2018-12-14 MED ORDER — CLONAZEPAM 0.5 MG PO TABS
0.5000 mg | ORAL_TABLET | Freq: Two times a day (BID) | ORAL | Status: DC | PRN
  Administered 2018-12-14: 0.5 mg via ORAL
  Filled 2018-12-14: qty 1

## 2018-12-14 MED ORDER — TRAZODONE HCL 100 MG PO TABS
200.0000 mg | ORAL_TABLET | Freq: Every evening | ORAL | Status: DC | PRN
  Administered 2018-12-14 – 2018-12-15 (×4): 200 mg via ORAL
  Filled 2018-12-14 (×9): qty 2

## 2018-12-14 MED ORDER — HYDROCODONE-ACETAMINOPHEN 7.5-325 MG PO TABS: 1.0000 | ORAL_TABLET | Freq: Two times a day (BID) | ORAL | Status: DC | PRN

## 2018-12-14 MED ORDER — CLONAZEPAM 0.5 MG PO TABS: 0.5000 mg | ORAL_TABLET | Freq: Every day | ORAL | Status: DC

## 2018-12-14 MED ORDER — HYDROCODONE-ACETAMINOPHEN 5-325 MG PO TABS
1.5000 | ORAL_TABLET | Freq: Once | ORAL | Status: AC | PRN
  Administered 2018-12-14: 1.5 via ORAL
  Filled 2018-12-14: qty 2

## 2018-12-14 MED ORDER — CLONAZEPAM 0.5 MG PO TABS
0.5000 mg | ORAL_TABLET | Freq: Every day | ORAL | Status: DC | PRN
  Administered 2018-12-14 – 2018-12-15 (×2): 0.5 mg via ORAL
  Filled 2018-12-14 (×3): qty 1

## 2018-12-14 NOTE — [facility] Suicide Risk Assessment (Addendum)
[facility] INPATIENT:  Family/Significant Other Suicide Prevention Education  Suicide Prevention Education:  Contact Attempts: daughter, Rose [name] [phone]), (name of family member/significant other) has been identified by the patient as the family member/significant other with whom the patient will be residing, and identified as the person(s) who will aid the patient in the event of a mental health crisis.  With written consent from the patient, two attempts were made to provide suicide prevention education, prior to and/or following the patient's discharge.  We were unsuccessful in providing suicide prevention education.  A suicide education pamphlet was given to the patient to share with family/significant other.  Date and time of first attempt:  12/14/2018  /  4:50pm  VM left Date and time of second attempt:   12/15/2018  / 8:51 AM   VM left  Rose [name] 12/14/2018, 5:08 PM   Rose Dominion, LCSW 12/15/2018, 8:51 AM

## 2018-12-14 NOTE — Progress Notes (Signed)
D.  Pt upset about medications on approach, states she takes more pain medication and higher dose of Klonopin than that ordered.  Pt also feels that she normally takes more Trazodone than she has ordered.  Pt states she did not sleep last night.  Pt's daughter called and is very concerned about patient's placement.  She stated that patient can not live with her, but she does want patient to be somewhere safe. Daughter states that patient is a patient of [facility] and that they should have sent patient's records here.  She states that they recommended that patient go to assisted living facility.  Daughter would like phone call from doctor regarding patient's discharge planning.  Pt denies SI/HI/AVH at this time.  A.  Support and encouragement offered to patient.  Medication given as ordered.  Assured daughter that her message would be passed on to appropriate staff.  R.  Pt remains safe on the unit, will continue to monitor.

## 2018-12-14 NOTE — [facility] Group Notes (Signed)
[facility] Group Notes:  (Nursing/MHT/Case Management/Adjunct)  Date:  12/14/2018  Time:  1:15 PM  Type of Therapy:  Nurse Education  Participation Level:  Active  Participation Quality:  Appropriate and Attentive  Affect:  Appropriate  Cognitive:  Alert and Appropriate  Insight:  Appropriate  Engagement in Group:  Engaged and Improving  Modes of Intervention:  Discussion, Education and Exploration  Summary of Progress/Problems:  pt's were instructed to identify needs and then discuss how these needs could be met/obtained  Baron Sane 12/14/2018, 3:05 PM

## 2018-12-14 NOTE — Progress Notes (Signed)
DAR NOTE: Patient presents with anxious affect and depressed mood. Pt has been isolative in the room most of the evening. Pt stated she can not control bladder and her bowel, would like to have some pull ups. Denies pain, auditory and visual hallucinations.  Maintained on routine safety checks.  Medications given as prescribed.  Support and encouragement offered as needed. Will continue to monitor.

## 2018-12-14 NOTE — Plan of Care (Signed)
Progress note  D: pt presented to the medication window; compliant with medication administration. Pt has complaints of back pain and insomnia. Pt states she can't sleep without her anxiety and pain medications. Pt was provided a 1x dose of these two. Pt was tearful before. Pt has been resting since. Pt has requested this be given at bedtime as well to aid in sleep. Pt states she was taking 500 mg of trazodone and her current dosage here isn't working. Pt denies si/hi/ah/vh and verbally agrees to approach staff if these become apparent or before harming himself/others while at [facility]: Pt provided support and encouragement. Pt given medication per protocol and standing orders. Q13m safety checks implemented and continued.  R: Pt safe on the unit. Will continue to monitor.  Pt progressing in the following metrics   Problem: Education: Goal: Knowledge of [place] [facility] Education information/materials will improve Outcome: Progressing Goal: Emotional status will improve Outcome: Progressing   Problem: Coping: Goal: Coping ability will improve Outcome: Progressing   Problem: Self-Concept: Goal: Ability to disclose and discuss suicidal ideas will improve Outcome: Progressing

## 2018-12-14 NOTE — Progress Notes (Signed)
[facility] MD Progress Note  12/14/2018 11:17 AM Rose [name]  MRN:  [id number]   Subjective:   Rose [name] reported " they are not giving me my medicaitons, and I need my pain pills and klonopin this is not right."  Objective: Rose [name] presented tearful and irritable.  She reports she is prescribed Klonopin 1 mg p.o. 3 times daily and hydrocodone 7.5 mg-325 for chronic lower back pain.  She reports stress related to being homeless and having anxiety due to not having her scheduled medications.  Currently denying suicidal or homicidal ideations.  Denies auditory or visual hallucinations.  Reports depression related to multiple situational stressors.  She reports her vehicle just recently broke down.  States she recently became homeless and is unable to stay with her daughter at this time.  Discussed titration to Klonopin from 1 mg to 0.5 mg p.o. daily PRN.  Patient was reluctantly receptacle to plan.  Patient encouraged to follow-up with social worker regarding housing situation.  Support, encouragement and reassurance was provided.  Principal Problem: Severe recurrent major depression without psychotic features (HCC) Diagnosis: Principal Problem:   Severe recurrent major depression without psychotic features (HCC)  Total Time spent with patient: 15 minutes  Past Psychiatric History:  Past Medical History:  Past Medical History:  Diagnosis Date  . Allergy   . Asthma   . Back pain   . Chronic headaches   . Diabetes (HCC)   . Herniated lumbar intervertebral disc   . High cholesterol   . History of blood clots   . Pancreatitis   . Stroke [facility])     Past Surgical History:  Procedure Laterality Date  . ANKLE SURGERY     x 3  . BREAST REDUCTION SURGERY    . CHOLECYSTECTOMY  1998  . HERNIA REPAIR    . VAGINAL HYSTERECTOMY  1988   Family History:  Family History  Problem Relation Age of Onset  . Emphysema Mother   . Heart disease Mother   . Cancer Mother   . Heart disease Father   .  Cancer Sister    Family Psychiatric  History:  Social History:  Social History   Substance and Sexual Activity  Alcohol Use No  . Alcohol/week: 0.0 standard drinks     Social History   Substance and Sexual Activity  Drug Use Yes  . Types: Marijuana    Social History   Socioeconomic History  . Marital status: Single    Spouse name: Not on file  . Number of children: Not on file  . Years of education: Not on file  . Highest education level: Not on file  Occupational History  . Not on file  Social Needs  . Financial resource strain: Not on file  . Food insecurity    Worry: Not on file    Inability: Not on file  . Transportation needs    Medical: Not on file    Non-medical: Not on file  Tobacco Use  . Smoking status: Former Smoker    Packs/day: 1.00    Years: 47.00    Pack years: 47.00    Types: Cigarettes, E-cigarettes    Quit date: 04/15/2017    Years since quitting: 1.6  . Smokeless tobacco: Never Used  . Tobacco comment: 1/2 - 1 PPD -- Heavy secondhand smoke exposure  Substance and Sexual Activity  . Alcohol use: No    Alcohol/week: 0.0 standard drinks  . Drug use: Yes    Types: Marijuana  . Sexual activity:  Not on file  Lifestyle  . Physical activity    Days per week: Not on file    Minutes per session: Not on file  . Stress: Not on file  Relationships  . Social Musicianconnections    Talks on phone: Not on file    Gets together: Not on file    Attends religious service: Not on file    Active member of club or organization: Not on file    Attends meetings of clubs or organizations: Not on file    Relationship status: Not on file  Other Topics Concern  . Not on file  Social History Narrative  . Not on file   Additional Social History:    Pain Medications: see MAR Prescriptions: see MAR Over the Counter: see MAR History of alcohol / drug use?: No history of alcohol / drug abuse                    Sleep: Fair  Appetite:  Fair  Current  Medications: Current Facility-Administered Medications  Medication Dose Route Frequency Provider Last Rate Last Dose  . albuterol (PROVENTIL) (2.5 MG/3ML) 0.083% nebulizer solution 2.5 mg  2.5 mg Nebulization Q4H PRN Antonieta Pertlary, Greg Lawson, MD   2.5 mg at 12/13/18 1426  . albuterol (VENTOLIN HFA) 108 (90 Base) MCG/ACT inhaler 1-2 puff  1-2 puff Inhalation Q4H PRN Nira ConnBerry, Jason A, NP   2 puff at 12/13/18 1122  . alum & mag hydroxide-simeth (MAALOX/MYLANTA) 200-200-20 MG/5ML suspension 30 mL  30 mL Oral Q4H PRN Nira ConnBerry, Jason A, NP      . buPROPion (WELLBUTRIN XL) 24 hr tablet 150 mg  150 mg Oral Daily Antonieta Pertlary, Greg Lawson, MD   150 mg at 12/14/18 0743  . clonazePAM (KLONOPIN) tablet 0.5 mg  0.5 mg Oral BID PRN Oneta RackLewis, Tanika N, NP      . DULoxetine (CYMBALTA) DR capsule 60 mg  60 mg Oral BID Nira ConnBerry, Jason A, NP   60 mg at 12/14/18 0744  . estradiol (ESTRACE) tablet 0.5 mg  0.5 mg Oral Daily Nira ConnBerry, Jason A, NP   0.5 mg at 12/14/18 0744  . HYDROcodone-acetaminophen (NORCO) 7.5-325 MG per tablet 1 tablet  1 tablet Oral BID PRN Oneta RackLewis, Tanika N, NP      . hydrOXYzine (ATARAX/VISTARIL) tablet 25 mg  25 mg Oral TID PRN Jackelyn PolingBerry, Jason A, NP   25 mg at 12/13/18 2108  . loperamide (IMODIUM) capsule 2 mg  2 mg Oral PRN Nira ConnBerry, Jason A, NP      . magnesium hydroxide (MILK OF MAGNESIA) suspension 30 mL  30 mL Oral Daily PRN Nira ConnBerry, Jason A, NP      . metFORMIN (GLUCOPHAGE-XR) 24 hr tablet 1,000 mg  1,000 mg Oral Q breakfast Nira ConnBerry, Jason A, NP   1,000 mg at 12/14/18 0744  . omega-3 acid ethyl esters (LOVAZA) capsule 1 g  1 g Oral BID Antonieta Pertlary, Greg Lawson, MD   1 g at 12/14/18 0744  . pantoprazole (PROTONIX) EC tablet 40 mg  40 mg Oral Daily Nira ConnBerry, Jason A, NP   40 mg at 12/14/18 0744  . rosuvastatin (CRESTOR) tablet 20 mg  20 mg Oral Daily Nira ConnBerry, Jason A, NP   20 mg at 12/13/18 1130  . traZODone (DESYREL) tablet 100 mg  100 mg Oral QHS,MR X 1 Nira ConnBerry, Jason A, NP   100 mg at 12/13/18 2108  . umeclidinium-vilanterol (ANORO ELLIPTA)  62.5-25 MCG/INH 1 puff  1 puff Inhalation Daily Nira ConnBerry, Jason  A, NP        Lab Results:  Results for orders placed or performed during the hospital encounter of 12/12/18 (from the past 48 hour(s))  SARS Coronavirus 2 (CEPHEID - Performed in [facility] hospital lab), Hosp Order     Status: None   Collection Time: 12/12/18 11:15 PM   Specimen: Nasopharyngeal Swab  Result Value Ref Range   SARS Coronavirus 2 NEGATIVE NEGATIVE    Comment: (NOTE) If result is NEGATIVE SARS-CoV-2 target nucleic acids are NOT DETECTED. The SARS-CoV-2 RNA is generally detectable in upper and lower  respiratory specimens during the acute phase of infection. The lowest  concentration of SARS-CoV-2 viral copies this assay can detect is 250  copies / mL. A negative result does not preclude SARS-CoV-2 infection  and should not be used as the sole basis for treatment or other  patient management decisions.  A negative result may occur with  improper specimen collection / handling, submission of specimen other  than nasopharyngeal swab, presence of viral mutation(s) within the  areas targeted by this assay, and inadequate number of viral copies  (<250 copies / mL). A negative result must be combined with clinical  observations, patient history, and epidemiological information. If result is POSITIVE SARS-CoV-2 target nucleic acids are DETECTED. The SARS-CoV-2 RNA is generally detectable in upper and lower  respiratory specimens dur ing the acute phase of infection.  Positive  results are indicative of active infection with SARS-CoV-2.  Clinical  correlation with patient history and other diagnostic information is  necessary to determine patient infection status.  Positive results do  not rule out bacterial infection or co-infection with other viruses. If result is PRESUMPTIVE POSTIVE SARS-CoV-2 nucleic acids MAY BE PRESENT.   A presumptive positive result was obtained on the submitted specimen  and confirmed on  repeat testing.  While 2019 novel coronavirus  (SARS-CoV-2) nucleic acids may be present in the submitted sample  additional confirmatory testing may be necessary for epidemiological  and / or clinical management purposes  to differentiate between  SARS-CoV-2 and other Sarbecovirus currently known to infect humans.  If clinically indicated additional testing with an alternate test  methodology [phone]) is advised. The SARS-CoV-2 RNA is generally  detectable in upper and lower respiratory sp ecimens during the acute  phase of infection. The expected result is Negative. Fact Sheet for Patients:  StrictlyIdeas.no Fact Sheet for Healthcare Providers: BankingDealers.co.za This test is not yet approved or cleared by the [place] FDA and has been authorized for detection and/or diagnosis of SARS-CoV-2 by FDA under an Emergency Use Authorization (EUA).  This EUA will remain in effect (meaning this test can be used) for the duration of the COVID-19 declaration under Section 564(b)(1) of the Act, 21 U.S.C. section 360bbb-3(b)(1), unless the authorization is terminated or revoked sooner. Performed at [facility], [place] [address], [place], [place] [address]   CBC     Status: None   Collection Time: 12/13/18  6:47 AM  Result Value Ref Range   WBC 5.6 4.0 - 10.5 K/uL   RBC 4.17 3.87 - 5.11 MIL/uL   Hemoglobin 12.3 12.0 - 15.0 g/dL   HCT 38.1 36.0 - 46.0 %   MCV 91.4 80.0 - 100.0 fL   MCH 29.5 26.0 - 34.0 pg   MCHC 32.3 30.0 - 36.0 g/dL   RDW 14.0 11.5 - 15.5 %   Platelets 215 150 - 400 K/uL   nRBC 0.0 0.0 - 0.2 %  Comment: Performed at [facility], [address], [place], [place] [address]  Comprehensive metabolic panel     Status: Abnormal   Collection Time: 12/13/18  6:47 AM  Result Value Ref Range   Sodium 142 135 - 145 mmol/L   Potassium 3.8 3.5 - 5.1 mmol/L   Chloride 107 98 - 111 mmol/L    CO2 27 22 - 32 mmol/L   Glucose, Bld 99 70 - 99 mg/dL   BUN 14 8 - 23 mg/dL   Creatinine, Ser 4.66 0.44 - 1.00 mg/dL   Calcium 8.8 (L) 8.9 - 10.3 mg/dL   Total Protein 6.7 6.5 - 8.1 g/dL   Albumin 3.7 3.5 - 5.0 g/dL   AST 14 (L) 15 - 41 U/L   ALT 13 0 - 44 U/L   Alkaline Phosphatase 100 38 - 126 U/L   Total Bilirubin 0.3 0.3 - 1.2 mg/dL   GFR calc non Af Amer >60 >60 mL/min   GFR calc Af Amer >60 >60 mL/min   Anion gap 8 5 - 15    Comment: Performed at [facility], [address] [place], [place] [address]  Hemoglobin A1c     Status: None   Collection Time: 12/13/18  6:47 AM  Result Value Ref Range   Hgb A1c MFr Bld 5.4 4.8 - 5.6 %    Comment: (NOTE) Pre diabetes:          5.7%-6.4% Diabetes:              >6.4% Glycemic control for   <7.0% adults with diabetes    Mean Plasma Glucose 108.28 mg/dL    Comment: Performed at [facility], [address] [place], [place] [address]  Lipid panel     Status: None   Collection Time: 12/13/18  6:47 AM  Result Value Ref Range   Cholesterol 158 0 - 200 mg/dL   Triglycerides 939 <030 mg/dL   HDL 51 >09 mg/dL   Total CHOL/HDL Ratio 3.1 RATIO   VLDL 27 0 - 40 mg/dL   LDL Cholesterol 80 0 - 99 mg/dL    Comment:        Total Cholesterol/HDL:CHD Risk Coronary Heart Disease Risk Table                     Men   Women  1/2 Average Risk   3.4   3.3  Average Risk       5.0   4.4  2 X Average Risk   9.6   7.1  3 X Average Risk  23.4   11.0        Use the calculated Patient Ratio above and the CHD Risk Table to determine the patient's CHD Risk.        ATP III CLASSIFICATION (LDL):  <100     mg/dL   Optimal  233-007  mg/dL   Near or Above                    Optimal  130-159  mg/dL   Borderline  622-633  mg/dL   High  >354     mg/dL   Very High Performed at [facility], [address] [place], [place] [address]   TSH     Status: None   Collection Time: 12/13/18  6:47 AM  Result Value  Ref Range   TSH 3.367 0.350 - 4.500 uIU/mL    Comment:  Performed by a 3rd Generation assay with a functional sensitivity of <=0.01 uIU/mL. Performed at [facility], [address] [place], [place] [address]   TSH     Status: None   Collection Time: 12/13/18  6:27 PM  Result Value Ref Range   TSH 1.776 0.350 - 4.500 uIU/mL    Comment: Performed by a 3rd Generation assay with a functional sensitivity of <=0.01 uIU/mL. Performed at [facility], [address] [place], [place] [address]   Basic metabolic panel     Status: None   Collection Time: 12/14/18  6:47 AM  Result Value Ref Range   Sodium 138 135 - 145 mmol/L   Potassium 4.0 3.5 - 5.1 mmol/L   Chloride 104 98 - 111 mmol/L   CO2 24 22 - 32 mmol/L   Glucose, Bld 92 70 - 99 mg/dL   BUN 16 8 - 23 mg/dL   Creatinine, Ser 8.650.80 0.44 - 1.00 mg/dL   Calcium 9.0 8.9 - 78.410.3 mg/dL   GFR calc non Af Amer >60 >60 mL/min   GFR calc Af Amer >60 >60 mL/min   Anion gap 10 5 - 15    Comment: Performed at [facility], [address] [place], [place] [address]    Blood Alcohol level:  No results found for: [facility]  Metabolic Disorder Labs: Lab Results  Component Value Date   HGBA1C 5.4 12/13/2018   MPG 108.28 12/13/2018   No results found for: PROLACTIN Lab Results  Component Value Date   CHOL 158 12/13/2018   TRIG 133 12/13/2018   HDL 51 12/13/2018   CHOLHDL 3.1 12/13/2018   VLDL 27 12/13/2018   LDLCALC 80 12/13/2018    Physical Findings: AIMS:  , ,  ,  ,    CIWA:    COWS:     Musculoskeletal: Strength & Muscle Tone: within normal limits Gait & Station: normal Patient leans: N/A  Psychiatric Specialty Exam: Physical Exam  Vitals reviewed. Constitutional: She appears well-developed.  Psychiatric: She has a normal mood and affect. Her behavior is normal.    Review of Systems  All other systems reviewed and are negative.   Blood pressure 109/63, pulse 81,  temperature 98.1 F (36.7 C), temperature source Oral, resp. rate 18, height 5\' 3"  (1.6 m), weight 74.8 kg, SpO2 95 %.Body mass index is 29.23 kg/m.  General Appearance: Casual  Eye Contact:  Minimal  Speech:  Clear and Coherent  Volume:  Normal  Mood:  Anxious, Depressed and Irritable  Affect:  Depressed  Thought Process:  Coherent  Orientation:  Full (Time, Place, and Person)  Thought Content:  Hallucinations: None  Suicidal Thoughts:  No  Homicidal Thoughts:  No  Memory:  Immediate;   Fair Remote;   Fair  Judgement:  Fair  Insight:  Lacking  Psychomotor Activity:  Normal  Concentration:  Concentration: Fair  Recall:  [employer]Fair  Fund of Knowledge:  Fair  Language:  Fair  Akathisia:  No  Handed:  Right  AIMS (if indicated):     Assets:  Communication Skills Desire for Improvement Physical Health Resilience Social Support  ADL's:  Intact  Cognition:  WNL  Sleep:  Number of Hours: 6.25     Treatment Plan Summary: Daily contact with patient to assess and evaluate symptoms and progress in treatment and Medication management   Continue with current treatment plan on 12/14/2018 as listed below except were noted  Major depression/Anxiety  Continue Wellbutrin 150  mg p.o. daily Continue Cymbalta 60 mg p.o. twice daily Restarted Klonopin at 0.5 mg p.o. twice daily PRN One-time order placed for hydrocodone 7.5 325 Continue trazodone 100 mg p.o. nightly as needed   CSW to continue working on discharge disposition Patient encouraged to participate within therapeutic milieu  Oneta Rack, NP 12/14/2018, 11:17 AM

## 2018-12-14 NOTE — BHH Group Notes (Signed)
[facility] LCSW Group Therapy Note  12/14/2018  10:00-11:00AM  Type of Therapy and Topic:  Group Therapy - Accepting We Are All Damaged People  Participation Level:  None - was present at the beginning of group but got up and left within minutes  Description of Group:  Patients in this group were asked to share whether they feel that they are "damaged" and explain their responses.  A song entitled "Damaged People" was then played, followed by a discussion of the relevance/relatedness of this song to each patient.   The conclusion of the group was that our goal as humans does not need to be perfection, but rather growth.  Insights among group members were shared, including that it is easy to point the fingers at others as being damaged, but actually we need to realize that we also are flawed humans with problems to overcome.  The group concluded with an emphasis on how this is ultimately a message of hope that we face struggles like every other person in the world, and that we are not alone.  Therapeutic Goals: 1)  introduce the concept of pain and hardship being universal  2)  connect emotionally to a musical message and to other group members  3)  identify the patient's current beliefs about their own broken methods of resolving their life problems to date, specifically related to this hospitalization  4)  allow time and space for patients to vent their pain and receive support from other patients  5)  elicit hope that arises from realizing we are not alone in our human struggles   Summary of Patient Progress:  N/A  Therapeutic Modalities:   Motivational Interviewing Activity  Rose [name]  12/14/2018 8:38 AM

## 2018-12-15 MED ORDER — HYDROCODONE-ACETAMINOPHEN 5-325 MG PO TABS
1.0000 | ORAL_TABLET | Freq: Once | ORAL | Status: DC | PRN
  Filled 2018-12-15: qty 1

## 2018-12-15 MED ORDER — HYDROCODONE-ACETAMINOPHEN 5-325 MG PO TABS
1.5000 | ORAL_TABLET | Freq: Once | ORAL | Status: AC | PRN
  Administered 2018-12-15: 21:00:00 1.5 via ORAL
  Filled 2018-12-15: qty 2

## 2018-12-15 NOTE — BHH Group Notes (Addendum)
Pt attended and participated in wrap up group this evening and rated their day a 10/10. Pt completed their goal which was to laugh more.

## 2018-12-15 NOTE — Plan of Care (Signed)
Nurse discussed anxiety, depression and coping skills with patient.  

## 2018-12-15 NOTE — BHH Group Notes (Signed)
  [facility] LCSW Group Therapy Note  Date/Time:  12/15/2018    10:15-11:15AM  Type of Therapy and Topic:  Group Therapy:  Healthy and Unhealthy Supports  Participation Level:  Active  Description of Group:  Patients in this group were led to examine the current supports in their lives and determine whether those supports are healthy or unhealthy for them.   Setting boundaries was discussed and described in detail.  An emphasis was placed on using individual counselor, doctor, therapy groups, 12-step groups, and problem-specific support groups to expand supports.  Therapeutic Goals: 1) compare healthy versus unhealthy supports and identify some examples of each 2) discuss importance of setting boundaries or terminating relationships with unhealthy supports  3)  generate ideas and descriptions of healthy supports that can be added  4)  offer mutual support about how to address unhealthy supports  5)  encourage active participation in and adherence to discharge plan    Summary of Patient Progress:  The patient stated that current healthy supports in her life are her daughter and her emotional support dog while current unhealthy supports include not having a place to stay.  The patient expressed that she needs a place to stay when she leaves the hospital, but she is unwilling to go to a shelter that would not accept her emotional support dog.  She said there is no place for him to go in the meantime.   Therapeutic Modalities:   Brief Solution-Focused Therapy  Selmer Dominion, LCSW

## 2018-12-15 NOTE — Progress Notes (Signed)
Pt requested that if she is sleeping this morning to not wake her.  Pt stated that she got no sleep at all the previous night.

## 2018-12-15 NOTE — B[facility] Group Notes (Signed)
Life SKills   Date:  12/15/2018  Time:  1300  Type of Therapy:  Nurse Education  : The group focuses on teaching patients how to identify unhelathy coping skills and developing newer healthier ways to get their needs met. Participation Level:  Active  Participation Quality:  Appropriate  Affect:  Appropriate  Cognitive:  Alert  Insight:  Good  Engagement in Group:  Engaged  Modes of Intervention:  Education  Summary of Progress/Problems:  Lauralyn Primes 12/15/2018, 3:38 PM

## 2018-12-15 NOTE — Progress Notes (Signed)
[facility] MD Progress Note  12/15/2018 12:01 PM Rose [name]  MRN:  [id number]   Subjective:   Rose [name] reported "  I don't know, I don't have anywhere to go."  Objective: Rose [name] seen resting in bed. She continue to ruminate regarding her housing situation.  She reports she does not have any support from her family or friends.  Reports her vehicle recently broke down so she is unable to reside in her car as she did prior to her admission.  She rates her depression 9 out of 10 with 10 being the worst due to housing situation.  She denies suicidal or homicidal ideations.  Denies auditory or visual hallucinations.  NP to follow-up with social worker for additional outpatient resources regarding housing and/or homeless shelter resources.  Reports decreased appetite.  States she is resting fairly throughout the night.  Support ,encouragement and reassurance was provided.     Principal Problem: Severe recurrent major depression without psychotic features (HCC) Diagnosis: Principal Problem:   Severe recurrent major depression without psychotic features (HCC)  Total Time spent with patient: 15 minutes  Past Psychiatric History:  Past Medical History:  Past Medical History:  Diagnosis Date  . Allergy   . Asthma   . Back pain   . Chronic headaches   . Diabetes (HCC)   . Herniated lumbar intervertebral disc   . High cholesterol   . History of blood clots   . Pancreatitis   . Stroke [facility])     Past Surgical History:  Procedure Laterality Date  . ANKLE SURGERY     x 3  . BREAST REDUCTION SURGERY    . CHOLECYSTECTOMY  1998  . HERNIA REPAIR    . VAGINAL HYSTERECTOMY  1988   Family History:  Family History  Problem Relation Age of Onset  . Emphysema Mother   . Heart disease Mother   . Cancer Mother   . Heart disease Father   . Cancer Sister    Family Psychiatric  History:  Social History:  Social History   Substance and Sexual Activity  Alcohol Use No  . Alcohol/week: 0.0 standard  drinks     Social History   Substance and Sexual Activity  Drug Use Yes  . Types: Marijuana    Social History   Socioeconomic History  . Marital status: Single    Spouse name: Not on file  . Number of children: Not on file  . Years of education: Not on file  . Highest education level: Not on file  Occupational History  . Not on file  Social Needs  . Financial resource strain: Not on file  . Food insecurity    Worry: Not on file    Inability: Not on file  . Transportation needs    Medical: Not on file    Non-medical: Not on file  Tobacco Use  . Smoking status: Former Smoker    Packs/day: 1.00    Years: 47.00    Pack years: 47.00    Types: Cigarettes, E-cigarettes    Quit date: 04/15/2017    Years since quitting: 1.6  . Smokeless tobacco: Never Used  . Tobacco comment: 1/2 - 1 PPD -- Heavy secondhand smoke exposure  Substance and Sexual Activity  . Alcohol use: No    Alcohol/week: 0.0 standard drinks  . Drug use: Yes    Types: Marijuana  . Sexual activity: Not on file  Lifestyle  . Physical activity    Days per week: Not on file  Minutes per session: Not on file  . Stress: Not on file  Relationships  . Social Musician on phone: Not on file    Gets together: Not on file    Attends religious service: Not on file    Active member of club or organization: Not on file    Attends meetings of clubs or organizations: Not on file    Relationship status: Not on file  Other Topics Concern  . Not on file  Social History Narrative  . Not on file   Additional Social History:    Pain Medications: see MAR Prescriptions: see MAR Over the Counter: see MAR History of alcohol / drug use?: No history of alcohol / drug abuse                    Sleep: Fair  Appetite:  Fair  Current Medications: Current Facility-Administered Medications  Medication Dose Route Frequency Provider Last Rate Last Dose  . albuterol (PROVENTIL) (2.5 MG/3ML) 0.083%  nebulizer solution 2.5 mg  2.5 mg Nebulization Q4H PRN Antonieta Pert, MD   2.5 mg at 12/13/18 1426  . albuterol (VENTOLIN HFA) 108 (90 Base) MCG/ACT inhaler 1-2 puff  1-2 puff Inhalation Q4H PRN Nira Conn A, NP   2 puff at 12/13/18 1122  . alum & mag hydroxide-simeth (MAALOX/MYLANTA) 200-200-20 MG/5ML suspension 30 mL  30 mL Oral Q4H PRN Nira Conn A, NP      . buPROPion (WELLBUTRIN XL) 24 hr tablet 150 mg  150 mg Oral Daily Antonieta Pert, MD   150 mg at 12/15/18 0809  . clonazePAM (KLONOPIN) tablet 0.5 mg  0.5 mg Oral Daily PRN Antonieta Pert, MD   0.5 mg at 12/14/18 2110  . DULoxetine (CYMBALTA) DR capsule 60 mg  60 mg Oral BID Nira Conn A, NP   60 mg at 12/15/18 0809  . estradiol (ESTRACE) tablet 0.5 mg  0.5 mg Oral Daily Nira Conn A, NP   0.5 mg at 12/15/18 0809  . hydrOXYzine (ATARAX/VISTARIL) tablet 25 mg  25 mg Oral TID PRN Jackelyn Poling, NP   25 mg at 12/14/18 2110  . loperamide (IMODIUM) capsule 2 mg  2 mg Oral PRN Nira Conn A, NP      . magnesium hydroxide (MILK OF MAGNESIA) suspension 30 mL  30 mL Oral Daily PRN Nira Conn A, NP      . metFORMIN (GLUCOPHAGE-XR) 24 hr tablet 1,000 mg  1,000 mg Oral Q breakfast Nira Conn A, NP   1,000 mg at 12/15/18 0809  . omega-3 acid ethyl esters (LOVAZA) capsule 1 g  1 g Oral BID Antonieta Pert, MD   1 g at 12/15/18 0810  . pantoprazole (PROTONIX) EC tablet 40 mg  40 mg Oral Daily Nira Conn A, NP   40 mg at 12/15/18 0810  . rosuvastatin (CRESTOR) tablet 20 mg  20 mg Oral Daily Nira Conn A, NP   20 mg at 12/15/18 0951  . traZODone (DESYREL) tablet 200 mg  200 mg Oral QHS,MR X 1 Jola Babinski, Marlane Mingle, MD   200 mg at 12/14/18 2234  . umeclidinium-vilanterol (ANORO ELLIPTA) 62.5-25 MCG/INH 1 puff  1 puff Inhalation Daily Jackelyn Poling, NP        Lab Results:  Results for orders placed or performed during the hospital encounter of 12/12/18 (from the past 48 hour(s))  TSH     Status: None   Collection Time:  12/13/18  6:27 PM  Result Value Ref Range   TSH 1.776 0.350 - 4.500 uIU/mL    Comment: Performed by a 3rd Generation assay with a functional sensitivity of <=0.01 uIU/mL. Performed at [facility], [place] [address], [place], [place] [address]   Basic metabolic panel     Status: None   Collection Time: 12/14/18  6:47 AM  Result Value Ref Range   Sodium 138 135 - 145 mmol/L   Potassium 4.0 3.5 - 5.1 mmol/L   Chloride 104 98 - 111 mmol/L   CO2 24 22 - 32 mmol/L   Glucose, Bld 92 70 - 99 mg/dL   BUN 16 8 - 23 mg/dL   Creatinine, Ser 0.80 0.44 - 1.00 mg/dL   Calcium 9.0 8.9 - 10.3 mg/dL   GFR calc non Af Amer >60 >60 mL/min   GFR calc Af Amer >60 >60 mL/min   Anion gap 10 5 - 15    Comment: Performed at [facility], [place] [address], [place], [place] [address]    Blood Alcohol level:  No results found for: [facility]  Metabolic Disorder Labs: Lab Results  Component Value Date   HGBA1C 5.4 12/13/2018   MPG 108.28 12/13/2018   No results found for: PROLACTIN Lab Results  Component Value Date   CHOL 158 12/13/2018   TRIG 133 12/13/2018   HDL 51 12/13/2018   CHOLHDL 3.1 12/13/2018   VLDL 27 12/13/2018   LDLCALC 80 12/13/2018    Physical Findings: AIMS: Facial and Oral Movements Muscles of Facial Expression: None, normal Lips and Perioral Area: None, normal Jaw: None, normal Tongue: None, normal,Extremity Movements Upper (arms, wrists, hands, fingers): None, normal Lower (legs, knees, ankles, toes): None, normal, Trunk Movements Neck, shoulders, hips: None, normal, Overall Severity Severity of abnormal movements (highest score from questions above): None, normal Incapacitation due to abnormal movements: None, normal Patient's awareness of abnormal movements (rate only patient's report): No Awareness, Dental Status Current problems with teeth and/or dentures?: No Does patient usually wear dentures?: No  CIWA:    COWS:      Musculoskeletal: Strength & Muscle Tone: within normal limits Gait & Station: normal Patient leans: N/A  Psychiatric Specialty Exam: Physical Exam  Vitals reviewed. Constitutional: She appears well-developed.  Psychiatric: She has a normal mood and affect. Her behavior is normal.    Review of Systems  All other systems reviewed and are negative.   Blood pressure 109/63, pulse 81, temperature 98.1 F (36.7 C), temperature source Oral, resp. rate 18, height 5\' 3"  (1.6 m), weight 74.8 kg, SpO2 95 %.Body mass index is 29.23 kg/m.  General Appearance: Casual tearful and depressed  Eye Contact:  Minimal  Speech:  Clear and Coherent  Volume:  Normal  Mood:  Anxious, Depressed and Irritable  Affect:  Depressed  Thought Process:  Coherent  Orientation:  Full (Time, Place, and Person)  Thought Content:  Hallucinations: None  Suicidal Thoughts:  No  Homicidal Thoughts:  No  Memory:  Immediate;   Fair Remote;   Fair  Judgement:  Fair  Insight:  Lacking  Psychomotor Activity:  Normal  Concentration:  Concentration: Fair  Recall:  [employer] of Knowledge:  Fair  Language:  Fair  Akathisia:  No  Handed:  Right  AIMS (if indicated):     Assets:  Communication Skills Desire for Improvement Physical Health Resilience Social Support  ADL's:  Intact  Cognition:  WNL  Sleep:  Number of Hours: 6.25  Treatment Plan Summary: Daily contact with patient to assess and evaluate symptoms and progress in treatment and Medication management   Continue with current treatment plan on 12/15/2018 as listed below except were noted  Major depression/Anxiety  Continue Wellbutrin 150 mg p.o. daily Continue Cymbalta 60 mg p.o. twice daily Restarted Klonopin at 0.5 mg p.o. twice daily PRN One-time order placed for hydrocodone 7.5 325 Continue trazodone 100 mg p.o. nightly as needed   CSW to continue working on discharge disposition-continue seeking homeless shelter resources Patient  encouraged to participate within therapeutic milieu  Oneta Rackanika N Lewis, NP 12/15/2018, 12:01 PM

## 2018-12-15 NOTE — Progress Notes (Signed)
D:  Patient's self inventory sheet, patient has fair sleep, sleep medication not helpful.  Poor appetite, low energy level, good concentration.  Rated depression 3, hopeless 2, anxiety 7.  Denied withdrawals.  Checked diarrhea, chilling, nausea, runny nose.  Denied SI.  Has felt dizzy, lightheaded and had pain today.  Pain #7 in past 24 hrs, tail bone and back.  Pain medicine is helpful.  Would like place to live and a car.  Not sure of her resources.  No discharge plans at this time. A:  Medications administered per MD orders.  Emotional support and encouragement given patient. Safety maintained with 15 minute checks.

## 2018-12-16 LAB — GLUCOSE, CAPILLARY: Glucose-Capillary: 95 mg/dL (ref 70–99)

## 2018-12-16 MED ORDER — DULOXETINE HCL 60 MG PO CPEP: 60.0000 mg | ORAL_CAPSULE | Freq: Two times a day (BID) | ORAL | 0 refills | Status: DC

## 2018-12-16 MED ORDER — HYDROXYZINE HCL 25 MG PO TABS: 25.0000 mg | ORAL_TABLET | Freq: Three times a day (TID) | ORAL | 0 refills | Status: DC | PRN

## 2018-12-16 MED ORDER — TRAZODONE HCL 100 MG PO TABS: 200.0000 mg | ORAL_TABLET | Freq: Every evening | ORAL | 0 refills | Status: DC | PRN

## 2018-12-16 MED ORDER — BUPROPION HCL ER (XL) 150 MG PO TB24: 150.0000 mg | ORAL_TABLET | Freq: Every day | ORAL | 0 refills | Status: DC

## 2018-12-16 NOTE — Progress Notes (Signed)
Pt discharged to lobby. Pt was stable and appreciative at that time. All papers and prescriptions were given and valuables returned. Verbal understanding expressed. Denies SI/HI and A/VH. Pt given opportunity to express concerns and ask questions.  

## 2018-12-16 NOTE — [facility] Suicide Risk Assessment (Signed)
[place] INPATIENT:  Family/Significant Other Suicide Prevention Education  Suicide Prevention Education:  Education Completed; Andreas Blower [phone])  has been identified by the patient as the family member/significant other with whom the patient will be residing, and identified as the person(s) who will aid the patient in the event of a mental health crisis (suicidal ideations/suicide attempt).  With written consent from the patient, the family member/significant other has been provided the following suicide prevention education, prior to the and/or following the discharge of the patient.  The suicide prevention education provided includes the following:  Suicide risk factors  Suicide prevention and interventions  National Suicide Hotline telephone number  [facility] assessment telephone number  [facility] Emergency Assistance [place] and/or Residential Mobile Crisis Unit telephone number  Request made of family/significant other to:  Remove weapons (e.g., guns, rifles, knives), all items previously/currently identified as safety concern.    Remove drugs/medications (over-the-counter, prescriptions, illicit drugs), all items previously/currently identified as a safety concern.  The family member/significant other verbalizes understanding of the suicide prevention education information provided.  The family member/significant other agrees to remove the items of safety concern listed above.  Rose [name] reports that she is concerned about her mother's homelessness. She reports that she believes her mother has dementia. She expressed to CSW that she wanted her mother to discharge to a skilled nursing facility for her dementia.   CSW explained to the patient's daughter that the patient refused any housing resources and states that she would like to discharge back to [facility] to connect with her peers. Rose [name] insisted that the patient could not stay with her and  that the patient needs to discharge to a facility. CSW explained that the patient has the right to self determination, unless she is deemed incompetent to make decisions for herself. CSW explained that the patient did not meet criteria for skilled nursing placement.   The patient's daughter expressed understanding. CSW will continue to follow for a safe discharge.       Rose [name] 12/16/2018, 2:14 PM

## 2018-12-16 NOTE — Tx Team (Signed)
Interdisciplinary Treatment and Diagnostic Plan Update  12/16/2018 Time of Session:  Rose [name] MRN: [id number]  Principal Diagnosis: Severe recurrent major depression without psychotic features [facility])  Secondary Diagnoses: Principal Problem:   Severe recurrent major depression without psychotic features (HCC)   Current Medications:  Current Facility-Administered Medications  Medication Dose Route Frequency Provider Last Rate Last Dose  . albuterol (PROVENTIL) (2.5 MG/3ML) 0.083% nebulizer solution 2.5 mg  2.5 mg Nebulization Q4H PRN Antonieta Pert, MD   2.5 mg at 12/13/18 1426  . albuterol (VENTOLIN HFA) 108 (90 Base) MCG/ACT inhaler 1-2 puff  1-2 puff Inhalation Q4H PRN Nira Conn A, NP   2 puff at 12/13/18 1122  . alum & mag hydroxide-simeth (MAALOX/MYLANTA) 200-200-20 MG/5ML suspension 30 mL  30 mL Oral Q4H PRN Nira Conn A, NP      . buPROPion (WELLBUTRIN XL) 24 hr tablet 150 mg  150 mg Oral Daily Antonieta Pert, MD   150 mg at 12/16/18 0818  . clonazePAM (KLONOPIN) tablet 0.5 mg  0.5 mg Oral Daily PRN Antonieta Pert, MD   0.5 mg at 12/15/18 2104  . DULoxetine (CYMBALTA) DR capsule 60 mg  60 mg Oral BID Nira Conn A, NP   60 mg at 12/16/18 0820  . estradiol (ESTRACE) tablet 0.5 mg  0.5 mg Oral Daily Nira Conn A, NP   0.5 mg at 12/16/18 5102  . hydrOXYzine (ATARAX/VISTARIL) tablet 25 mg  25 mg Oral TID PRN Jackelyn Poling, NP   25 mg at 12/15/18 2104  . loperamide (IMODIUM) capsule 2 mg  2 mg Oral PRN Nira Conn A, NP      . magnesium hydroxide (MILK OF MAGNESIA) suspension 30 mL  30 mL Oral Daily PRN Nira Conn A, NP      . metFORMIN (GLUCOPHAGE-XR) 24 hr tablet 1,000 mg  1,000 mg Oral Q breakfast Nira Conn A, NP   1,000 mg at 12/16/18 0818  . omega-3 acid ethyl esters (LOVAZA) capsule 1 g  1 g Oral BID Antonieta Pert, MD   1 g at 12/16/18 0818  . pantoprazole (PROTONIX) EC tablet 40 mg  40 mg Oral Daily Nira Conn A, NP   40 mg at 12/16/18 0818  .  rosuvastatin (CRESTOR) tablet 20 mg  20 mg Oral Daily Nira Conn A, NP   20 mg at 12/16/18 0818  . traZODone (DESYREL) tablet 200 mg  200 mg Oral QHS,MR X 1 Antonieta Pert, MD   200 mg at 12/15/18 2145  . umeclidinium-vilanterol (ANORO ELLIPTA) 62.5-25 MCG/INH 1 puff  1 puff Inhalation Daily Nira Conn A, NP   1 puff at 12/16/18 0820   PTA Medications: Facility-Administered Medications Prior to Admission  Medication Dose Route Frequency Provider Last Rate Last Dose  . triamcinolone acetonide (KENALOG) 10 MG/ML injection 10 mg  10 mg Other Once Sheard, Myeong O, DPM       Medications Prior to Admission  Medication Sig Dispense Refill Last Dose  . ANORO ELLIPTA 62.5-25 MCG/INH AEPB Inhale 1 puff into the lungs daily.     Marland Kitchen aspirin EC 81 MG tablet Take 81 mg by mouth.     Marland Kitchen azithromycin (ZITHROMAX Z-PAK) 250 MG tablet Take 2 tablets the first day of taking this antibiotic then take 1 pill per day until complete. 6 tablet 0   . benzonatate (TESSALON) 100 MG capsule Take 1 capsule (100 mg total) 3 (three) times daily as needed by mouth for cough. 21 capsule  0   . clonazePAM (KLONOPIN) 1 MG tablet Take 1 mg by mouth.     . cyclobenzaprine (FLEXERIL) 10 MG tablet TK 1 T PO TID PRF MUSCLE SPASM  0   . diphenhydrAMINE (BENADRYL) 50 MG tablet Take 1 tablet (50 mg total) by mouth every 12 (twelve) hours as needed for itching. 60 tablet 0   . docusate sodium (COLACE) 100 MG capsule Take 100 mg by mouth.     . DULoxetine (CYMBALTA) 60 MG capsule Take 1 capsule by mouth 2 (two) times daily.     Marland Kitchen. estradiol (ESTRACE) 0.5 MG tablet TK 1 T PO D  1   . famotidine (PEPCID) 40 MG tablet Take 1 tablet (40 mg total) by mouth daily. 30 tablet 0   . fluticasone (FLONASE) 50 MCG/ACT nasal spray Place 1 spray daily into both nostrils. 16 g 2   . gabapentin (NEURONTIN) 600 MG tablet TK 1 T PO TID  0   . HYDROcodone-acetaminophen (NORCO) 10-325 MG tablet Take 1 tablet by mouth 2 (two) times daily as needed. 60  tablet 0   . meloxicam (MOBIC) 15 MG tablet TK 1 T PO D  0   . metFORMIN (GLUCOPHAGE-XR) 500 MG 24 hr tablet Take 2 tablets by mouth daily.     . nitroGLYCERIN (NITROSTAT) 0.4 MG SL tablet Place 0.4 mg under the tongue every 5 (five) minutes as needed for chest pain.     Marland Kitchen. nystatin-triamcinolone (MYCOLOG II) cream Apply 1 application topically 2 (two) times daily.   Not Taking at Unknown time  . omega-3 acid ethyl esters (LOVAZA) 1 G capsule TK 2 CS PO BID  6   . omeprazole (PRILOSEC) 20 MG capsule TK ONE C PO BID  6   . ondansetron (ZOFRAN ODT) 4 MG disintegrating tablet Take 1 tablet (4 mg total) by mouth every 8 (eight) hours as needed for nausea. 6 tablet 0   . oxyCODONE-acetaminophen (PERCOCET) 10-325 MG tablet Take 1 tablet every 12 (twelve) hours as needed by mouth for pain. 60 tablet 0   . PROAIR HFA 108 (90 Base) MCG/ACT inhaler INHALE 2 PUFFS BY MOUTH EVERY 6 HOURS AS NEEDED FOR WHEEZING OR FOR SHORTNESS OF BREATH 9 each 0   . rosuvastatin (CRESTOR) 20 MG tablet Take 20 mg by mouth daily.     . sucralfate (CARAFATE) 1 g tablet Take 1 tablet (1 g total) by mouth 4 (four) times daily. 60 tablet 0   . traZODone (DESYREL) 100 MG tablet TK 2 TS PO QHS  1     Patient Stressors: Financial difficulties Health problems Legal issue  Patient Strengths: General fund of knowledge Supportive family/friends  Treatment Modalities: Medication Management, Group therapy, Case management,  1 to 1 session with clinician, Psychoeducation, Recreational therapy.   Physician Treatment Plan for Primary Diagnosis: Severe recurrent major depression without psychotic features (HCC) Long Term Goal(s): Improvement in symptoms so as ready for discharge Improvement in symptoms so as ready for discharge   Short Term Goals: Ability to identify changes in lifestyle to reduce recurrence of condition will improve Ability to verbalize feelings will improve Ability to disclose and discuss suicidal ideas Ability  to demonstrate self-control will improve Ability to identify and develop effective coping behaviors will improve Ability to maintain clinical measurements within normal limits will improve Compliance with prescribed medications will improve Ability to identify changes in lifestyle to reduce recurrence of condition will improve Ability to verbalize feelings will improve Ability to disclose and  discuss suicidal ideas Ability to demonstrate self-control will improve Ability to identify and develop effective coping behaviors will improve Ability to maintain clinical measurements within normal limits will improve Compliance with prescribed medications will improve  Medication Management: Evaluate patient's response, side effects, and tolerance of medication regimen.  Therapeutic Interventions: 1 to 1 sessions, Unit Group sessions and Medication administration.  Evaluation of Outcomes: Adequate for Discharge  Physician Treatment Plan for Secondary Diagnosis: Principal Problem:   Severe recurrent major depression without psychotic features ([place])  Long Term Goal(s): Improvement in symptoms so as ready for discharge Improvement in symptoms so as ready for discharge   Short Term Goals: Ability to identify changes in lifestyle to reduce recurrence of condition will improve Ability to verbalize feelings will improve Ability to disclose and discuss suicidal ideas Ability to demonstrate self-control will improve Ability to identify and develop effective coping behaviors will improve Ability to maintain clinical measurements within normal limits will improve Compliance with prescribed medications will improve Ability to identify changes in lifestyle to reduce recurrence of condition will improve Ability to verbalize feelings will improve Ability to disclose and discuss suicidal ideas Ability to demonstrate self-control will improve Ability to identify and develop effective coping behaviors will  improve Ability to maintain clinical measurements within normal limits will improve Compliance with prescribed medications will improve     Medication Management: Evaluate patient's response, side effects, and tolerance of medication regimen.  Therapeutic Interventions: 1 to 1 sessions, Unit Group sessions and Medication administration.  Evaluation of Outcomes: Adequate for Discharge   RN Treatment Plan for Primary Diagnosis: Severe recurrent major depression without psychotic features ([place]) Long Term Goal(s): Knowledge of disease and therapeutic regimen to maintain health will improve  Short Term Goals: Ability to participate in decision making will improve, Ability to verbalize feelings will improve, Ability to disclose and discuss suicidal ideas, Ability to identify and develop effective coping behaviors will improve and Compliance with prescribed medications will improve  Medication Management: RN will administer medications as ordered by provider, will assess and evaluate patient's response and provide education to patient for prescribed medication. RN will report any adverse and/or side effects to prescribing provider.  Therapeutic Interventions: 1 on 1 counseling sessions, Psychoeducation, Medication administration, Evaluate responses to treatment, Monitor vital signs and CBGs as ordered, Perform/monitor CIWA, COWS, AIMS and Fall Risk screenings as ordered, Perform wound care treatments as ordered.  Evaluation of Outcomes: Adequate for Discharge   LCSW Treatment Plan for Primary Diagnosis: Severe recurrent major depression without psychotic features ([place]) Long Term Goal(s): Safe transition to appropriate next level of care at discharge, Engage patient in therapeutic group addressing interpersonal concerns.  Short Term Goals: Engage patient in aftercare planning with referrals and resources  Therapeutic Interventions: Assess for all discharge needs, 1 to 1 time with Social worker,  Explore available resources and support systems, Assess for adequacy in community support network, Educate family and significant other(s) on suicide prevention, Complete Psychosocial Assessment, Interpersonal group therapy.  Evaluation of Outcomes: Adequate for Discharge   Progress in Treatment: Attending groups: Yes. Participating in groups: Yes. Taking medication as prescribed: Yes. Toleration medication: Yes. Family/Significant other contact made: No, will contact:  attempted to contact the patient's daughter Patient understands diagnosis: Yes. Discussing patient identified problems/goals with staff: Yes. Medical problems stabilized or resolved: Yes. Denies suicidal/homicidal ideation: Yes. Issues/concerns per patient self-inventory: Yes. Other:   New problem(s) identified: None   New Short Term/Long Term Goal(s): medication stabilization, elimination of SI thoughts, development of  comprehensive mental wellness plan.    Patient Goals:     Discharge Plan or Barriers: Patient recently admitted. CSW will continue to follow and assess for appropriate referrals and possible discharge planning.    Reason for Continuation of Hospitalization: None   Estimated Length of Stay: Discharging, 12/16/2018  Attendees: Patient: 12/16/2018 8:49 AM  Physician: Dr. Landry MellowGreg Clary, MD 12/16/2018 8:49 AM  Nursing: Rodell [email or web address], RN; Lanora [email or web address] EagleO, RN 12/16/2018 8:49 AM  RN Care Manager: 12/16/2018 8:49 AM  Social Worker: Baldo DaubJolan Genova Kiner, LCSWA 12/16/2018 8:49 AM  Recreational Therapist:  12/16/2018 8:49 AM  Other:  12/16/2018 8:49 AM  Other:  12/16/2018 8:49 AM  Other: 12/16/2018 8:49 AM    Scribe for Treatment Team: Maeola SarahJolan E Annina Piotrowski, LCSWA 12/16/2018 8:49 AM

## 2018-12-16 NOTE — Progress Notes (Signed)
D.  Pt pleasant but anxious on approach, asks if she will go home tomorrow.  Pt states that she is ready to be discharged.  Pt was positive for evening wrap up group, observed engaged in appropriate but minimal interaction with peers on the unit.  Pt denies SI/HI/AVH at this time.  Pt's daughter called and again reiterated that patient can not live with her.  Daughter states that patient needs assisted living and that daughter is unable to provide the type of care patient requires.  A.  Support and encouragement offered medication given as ordered  R.  Pt remains safe on the unit, will continue to monitor.

## 2018-12-16 NOTE — Discharge Summary (Signed)
Physician Discharge Summary Note  Patient:  Rose [name] is an 63 y.o., female MRN:  [id number] DOB:  06/15/1955 Patient phone:  [phone] (home)  Patient address:   [address] [place] [id number],  Total Time spent with patient: 15 minutes  Date of Admission:  12/12/2018 Date of Discharge: 12/16/18  Reason for Admission:  suicidal ideation  Principal Problem: Severe recurrent major depression without psychotic features [facility](HCC) Discharge Diagnoses: Principal Problem:   Severe recurrent major depression without psychotic features [facility](HCC)   Past Psychiatric History: Patient stated she has been treated with psychiatric medications in the past, but is never been admitted to the hospital.  She could not recall exactly when her last appointment was with the psychiatrist.  She stated she had previously taken Cymbalta, BuSpar and other medications which she was unable to recall.  Past Medical History:  Past Medical History:  Diagnosis Date  . Allergy   . Asthma   . Back pain   . Chronic headaches   . Diabetes (HCC)   . Herniated lumbar intervertebral disc   . High cholesterol   . History of blood clots   . Pancreatitis   . Stroke [facility](HCC)     Past Surgical History:  Procedure Laterality Date  . ANKLE SURGERY     x 3  . BREAST REDUCTION SURGERY    . CHOLECYSTECTOMY  1998  . HERNIA REPAIR    . VAGINAL HYSTERECTOMY  1988   Family History:  Family History  Problem Relation Age of Onset  . Emphysema Mother   . Heart disease Mother   . Cancer Mother   . Heart disease Father   . Cancer Sister    Family Psychiatric  History: Denies Social History:  Social History   Substance and Sexual Activity  Alcohol Use No  . Alcohol/week: 0.0 standard drinks     Social History   Substance and Sexual Activity  Drug Use Yes  . Types: Marijuana    Social History   Socioeconomic History  . Marital status: Single    Spouse name: Not on file  . Number of children: Not on file   . Years of education: Not on file  . Highest education level: Not on file  Occupational History  . Not on file  Social Needs  . Financial resource strain: Not on file  . Food insecurity    Worry: Not on file    Inability: Not on file  . Transportation needs    Medical: Not on file    Non-medical: Not on file  Tobacco Use  . Smoking status: Former Smoker    Packs/day: 1.00    Years: 47.00    Pack years: 47.00    Types: Cigarettes, E-cigarettes    Quit date: 04/15/2017    Years since quitting: 1.6  . Smokeless tobacco: Never Used  . Tobacco comment: 1/2 - 1 PPD -- Heavy secondhand smoke exposure  Substance and Sexual Activity  . Alcohol use: No    Alcohol/week: 0.0 standard drinks  . Drug use: Yes    Types: Marijuana  . Sexual activity: Not on file  Lifestyle  . Physical activity    Days per week: Not on file    Minutes per session: Not on file  . Stress: Not on file  Relationships  . Social Musicianconnections    Talks on phone: Not on file    Gets together: Not on file    Attends religious service: Not on file  Active member of club or organization: Not on file    Attends meetings of clubs or organizations: Not on file    Relationship status: Not on file  Other Topics Concern  . Not on file  Social History Narrative  . Not on file    Hospital Course:  From admission H&P: Patient is a 63 year old female with a reported past psychiatric history significant for major depression and a past medical history significant for history of pulmonary emboli, pancreatitis, status post CVA, COPD, chronic back pain and diabetes who presented as a walk-in patient on 12/12/2018 to the behavioral health hospital. The patient presented and was crying throughout the initial assessment. She stated that that time that "I just lost, nobody cares.". She reportedly has been homeless for the last 4 years. Apparently her daughter lives in the [place] area, and suggested that the patient come  here. The patient stated that most recently she has been staying at a hotel, and her boyfriend of the last 4 months recently left. She stated she is unable to cope anymore with her homelessness situation. She apparently is on disability, and has been treated for depression in the past. She stated she was taking Cymbalta 60 mg p.o. twice daily for chronic pain issues. She had not been previously treated with Wellbutrin. She stated she had been on BuSpar in the past, and "gained a lot of weight". She admitted to helplessness, hopelessness and worthlessness. She also has a history of pulmonary emboli, complained of shortness of breath, has continued to smoke tobacco, and is taking estrogens. She was admitted to the hospital for evaluation and stabilization.  Ms. Butterbaugh was admitted for suicidal ideation. She remained on the [facility] unit for four days. Cymbalta and trazodone were continued. Wellbutrin and Vistaril were started. She participated in group therapy on the unit. She responded well to treatment with no adverse effects reported. She is requesting discharge. She was discharged on the medications listed below. She has shown improvement with improved mood, affect, sleep, appetite, and interaction. She denies any SI/HI/AVH and contracts for safety. She agrees to follow up at [facility] (see below). Patient is provided with prescriptions for medications upon discharge. She was offered resources for housing but patient states preference to return to previous living situation due to not wanting to lose her dog. She is discharging via [facility].  Physical Findings: AIMS: Facial and Oral Movements Muscles of Facial Expression: None, normal Lips and Perioral Area: None, normal Jaw: None, normal Tongue: None, normal,Extremity Movements Upper (arms, wrists, hands, fingers): None, normal Lower (legs, knees, ankles, toes): None, normal, Trunk Movements Neck, shoulders, hips: None, normal, Overall  Severity Severity of abnormal movements (highest score from questions above): None, normal Incapacitation due to abnormal movements: None, normal Patient's awareness of abnormal movements (rate only patient's report): No Awareness, Dental Status Current problems with teeth and/or dentures?: No Does patient usually wear dentures?: No  CIWA:  CIWA-Ar Total: 1 COWS:  COWS Total Score: 1  Musculoskeletal: Strength & Muscle Tone: within normal limits Gait & Station: normal Patient leans: N/A  Psychiatric Specialty Exam: Physical Exam  Nursing note and vitals reviewed. Constitutional: She is oriented to person, place, and time. She appears well-developed and well-nourished.  Cardiovascular: Normal rate.  Respiratory: Effort normal.  Neurological: She is alert and oriented to person, place, and time.    Review of Systems  Constitutional: Negative.   Psychiatric/Behavioral: Positive for depression (stable on medication). Negative for hallucinations, substance abuse and suicidal ideas.  The patient is not nervous/anxious and does not have insomnia.     Blood pressure 131/76, pulse 76, temperature 98.1 F (36.7 C), temperature source Oral, resp. rate 18, height 5\' 3"  (1.6 m), weight 74.8 kg, SpO2 95 %.Body mass index is 29.23 kg/m.  See MD's discharge SRA     Have you used any form of tobacco in the last 30 days? (Cigarettes, Smokeless Tobacco, Cigars, and/or Pipes): Yes  Has this patient used any form of tobacco in the last 30 days? (Cigarettes, Smokeless Tobacco, Cigars, and/or Pipes)  No  Blood Alcohol level:  No results found for: [facility]  Metabolic Disorder Labs:  Lab Results  Component Value Date   HGBA1C 5.4 12/13/2018   MPG 108.28 12/13/2018   No results found for: PROLACTIN Lab Results  Component Value Date   CHOL 158 12/13/2018   TRIG 133 12/13/2018   HDL 51 12/13/2018   CHOLHDL 3.1 12/13/2018   VLDL 27 12/13/2018   LDLCALC 80 12/13/2018    See Psychiatric Specialty  Exam and Suicide Risk Assessment completed by Attending Physician prior to discharge.  Discharge destination:  Home  Is patient on multiple antipsychotic therapies at discharge:  No   Has Patient had three or more failed trials of antipsychotic monotherapy by history:  No  Recommended Plan for Multiple Antipsychotic Therapies: NA  Discharge Instructions    Discharge instructions   Complete by: As directed    Patient is instructed to take all prescribed medications as recommended. Report any side effects or adverse reactions to your outpatient psychiatrist. Patient is instructed to abstain from alcohol and illegal drugs while on prescription medications. In the event of worsening symptoms, patient is instructed to call the crisis hotline, 911, or go to the nearest emergency department for evaluation and treatment.     Allergies as of 12/16/2018      Reactions   Nickel Rash, Shortness Of Breath   Oxycodone Shortness Of Breath   Codeine Swelling   Hydrocodone-acetaminophen Itching   Morphine Itching   Propoxyphene Other (See Comments)   Quetiapine    Other reaction(s): Other (See Comments) Restless leg syndrome      Medication List    STOP taking these medications   aspirin EC 81 MG tablet   azithromycin 250 MG tablet Commonly known as: Zithromax Z-Pak   benzonatate 100 MG capsule Commonly known as: TESSALON   Colace 100 MG capsule Generic drug: docusate sodium   cyclobenzaprine 10 MG tablet Commonly known as: FLEXERIL   diphenhydrAMINE 50 MG tablet Commonly known as: BENADRYL   famotidine 40 MG tablet Commonly known as: PEPCID   fluticasone 50 MCG/ACT nasal spray Commonly known as: FLONASE   gabapentin 600 MG tablet Commonly known as: NEURONTIN   HYDROcodone-acetaminophen 10-325 MG tablet Commonly known as: NORCO   meloxicam 15 MG tablet Commonly known as: MOBIC   nitroGLYCERIN 0.4 MG SL tablet Commonly known as: NITROSTAT   nystatin-triamcinolone  cream Commonly known as: MYCOLOG II   ondansetron 4 MG disintegrating tablet Commonly known as: Zofran ODT   oxyCODONE-acetaminophen 10-325 MG tablet Commonly known as: Percocet   sucralfate 1 g tablet Commonly known as: Carafate     TAKE these medications     Indication  Anoro Ellipta 62.5-25 MCG/INH Aepb Generic drug: umeclidinium-vilanterol Inhale 1 puff into the lungs daily.  Indication: Chronic Obstructive Lung Disease   buPROPion 150 MG 24 hr tablet Commonly known as: WELLBUTRIN XL Take 1 tablet (150 mg total) by mouth daily. Start  taking on: December 17, 2018  Indication: Major Depressive Disorder   DULoxetine 60 MG capsule Commonly known as: CYMBALTA Take 1 capsule (60 mg total) by mouth 2 (two) times daily.  Indication: Major Depressive Disorder   estradiol 0.5 MG tablet Commonly known as: ESTRACE TK 1 T PO D  Indication: Deficiency of the Hormone Estrogen   hydrOXYzine 25 MG tablet Commonly known as: ATARAX/VISTARIL Take 1 tablet (25 mg total) by mouth 3 (three) times daily as needed for anxiety.  Indication: Feeling Anxious   KlonoPIN 1 MG tablet Generic drug: clonazePAM Take 1 mg by mouth.  Indication: Feeling Anxious   metFORMIN 500 MG 24 hr tablet Commonly known as: GLUCOPHAGE-XR Take 2 tablets by mouth daily.  Indication: Type 2 Diabetes   omega-3 acid ethyl esters 1 g capsule Commonly known as: LOVAZA TK 2 CS PO BID  Indication: Supplementation   omeprazole 20 MG capsule Commonly known as: PRILOSEC TK ONE C PO BID  Indication: Gastroesophageal Reflux Disease   ProAir HFA 108 (90 Base) MCG/ACT inhaler Generic drug: albuterol INHALE 2 PUFFS BY MOUTH EVERY 6 HOURS AS NEEDED FOR WHEEZING OR FOR SHORTNESS OF BREATH  Indication: Chronic Obstructive Lung Disease   rosuvastatin 20 MG tablet Commonly known as: CRESTOR Take 20 mg by mouth daily.  Indication: High Amount of Fats in the Blood   traZODone 100 MG tablet Commonly known as:  DESYREL Take 2 tablets (200 mg total) by mouth at bedtime as needed for sleep. What changed: See the new instructions.  Indication: Trouble Sleeping      Follow-up Information    [facility] on 12/18/2018.   Why: Medication management appointment with Dr. Darryl Lent is Wednesday, 7/22 at 5:30p.  Please bring your current medications and discharge paperwork from this hospitalizations.  Contact information: [address] [place] [address] [phone]           Follow-up recommendations: Activity as tolerated. Diet as recommended by primary care physician. Keep all scheduled follow-up appointments as recommended.   Comments:   Patient is instructed to take all prescribed medications as recommended. Report any side effects or adverse reactions to your outpatient psychiatrist. Patient is instructed to abstain from alcohol and illegal drugs while on prescription medications. In the event of worsening symptoms, patient is instructed to call the crisis hotline, 911, or go to the nearest emergency department for evaluation and treatment.  Signed: [name], NP 12/16/2018, 9:39 AM

## 2018-12-16 NOTE — BHH Suicide Risk Assessment (Signed)
[facility] Discharge Suicide Risk Assessment   Principal Problem: Severe recurrent major depression without psychotic features [facility]) Discharge Diagnoses: Principal Problem:   Severe recurrent major depression without psychotic features ([place])   Total Time spent with patient: 15 minutes  Musculoskeletal: Strength & Muscle Tone: within normal limits Gait & Station: normal Patient leans: N/A  Psychiatric Specialty Exam: Review of Systems  All other systems reviewed and are negative.   Blood pressure 131/76, pulse 76, temperature 98.1 F (36.7 C), temperature source Oral, resp. rate 18, height 5\' 3"  (1.6 m), weight 74.8 kg, SpO2 95 %.Body mass index is 29.23 kg/m.  General Appearance: Casual  Eye Contact::  Fair  Speech:  Normal Rate409  Volume:  Normal  Mood:  Euthymic  Affect:  Congruent  Thought Process:  Coherent and Descriptions of Associations: Intact  Orientation:  Full (Time, Place, and Person)  Thought Content:  Logical  Suicidal Thoughts:  No  Homicidal Thoughts:  No  Memory:  Immediate;   Fair Recent;   Fair Remote;   Fair  Judgement:  Fair  Insight:  Fair  Psychomotor Activity:  Normal  Concentration:  Fair  Recall:  [employer] of Knowledge:Fair  Language: Good  Akathisia:  Negative  Handed:  Right  AIMS (if indicated):     Assets:  Desire for Improvement Resilience  Sleep:  Number of Hours: 5  Cognition: WNL  ADL's:  Intact   Mental Status Per Nursing Assessment::   On Admission:  NA  Demographic Factors:  Divorced or widowed, Caucasian, Low socioeconomic status and Unemployed  Loss Factors: Loss of significant relationship  Historical Factors: Impulsivity  Risk Reduction Factors:   NA  Continued Clinical Symptoms:  Depression:   Impulsivity  Cognitive Features That Contribute To Risk:  None    Suicide Risk:  Minimal: No identifiable suicidal ideation.  Patients presenting with no risk factors but with morbid ruminations; may be classified as  minimal risk based on the severity of the depressive symptoms  [facility] Follow up on 12/18/2018.   Why: Medication management appointment with Dr. Windell Hummingbird is Wednesday, 7/22 at 5:30p.  Please bring your current medications and discharge paperwork from this hospitalizations.  Contact information: [address] [place] [address] [phone]           Plan Of Care/Follow-up recommendations:  Activity:  ad lib  Sharma Covert, MD 12/16/2018, 7:17 AM

## 2018-12-16 NOTE — Progress Notes (Signed)
  [facility] Adult Case Management Discharge Plan :  Will you be returning to the same living situation after discharge:  Yes,  patient reports she wants to return to [facility]. Patient declined any housing/shelter resources. She reports that she cannot go anywhere without her dog At discharge, do you have transportation home?: Yes,  [employer] (Ly[employer]) transport  Do you have the ability to pay for your medications: No.  Release of information consent forms completed and in the chart;  Patient's signature needed at discharge.  Patient to Follow up at: Follow-up [facility] Follow up on 12/18/2018.   Why: Medication management appointment with Dr. Windell Hummingbird is Wednesday, 7/22 at 5:30p.  Please bring your current medications and discharge paperwork from this hospitalizations.  Contact information: [address] [place] [address] [phone]           Next level of care provider has access to [facility] and Suicide Prevention discussed: Yes,  with the patient's daughter  Have you used any form of tobacco in the last 30 days? (Cigarettes, Smokeless Tobacco, Cigars, and/or Pipes): Yes  Has patient been referred to the Quitline?: Patient refused referral  Patient has been referred for addiction treatment: [place], LCSWA 12/16/2018, 2:22 PM

## 2018-12-28 ENCOUNTER — Other Ambulatory Visit: Payer: Self-pay

## 2018-12-28 ENCOUNTER — Encounter (HOSPITAL_COMMUNITY): Payer: Self-pay

## 2018-12-28 ENCOUNTER — Emergency Department (HOSPITAL_COMMUNITY)
Admission: EM | Admit: 2018-12-28 | Discharge: 2018-12-28 | Disposition: A | Discharge status: Home / Self Care | Payer: Medicare HMO | Attending: Emergency Medicine | Admitting: Emergency Medicine

## 2018-12-28 DIAGNOSIS — Z7984 Long term (current) use of oral hypoglycemic drugs: Secondary | ICD-10-CM | POA: Diagnosis not present

## 2018-12-28 DIAGNOSIS — Z79899 Other long term (current) drug therapy: Secondary | ICD-10-CM | POA: Diagnosis not present

## 2018-12-28 DIAGNOSIS — F418 Other specified anxiety disorders: Secondary | ICD-10-CM | POA: Diagnosis not present

## 2018-12-28 DIAGNOSIS — E119 Type 2 diabetes mellitus without complications: Secondary | ICD-10-CM | POA: Diagnosis not present

## 2018-12-28 DIAGNOSIS — Z87891 Personal history of nicotine dependence: Secondary | ICD-10-CM | POA: Insufficient documentation

## 2018-12-28 DIAGNOSIS — Z5900 Homelessness unspecified: Secondary | ICD-10-CM
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
## 2018-12-28 NOTE — Discharge Instructions (Addendum)
Follow-up with your doctor for refills of your medications.

## 2018-12-28 NOTE — Progress Notes (Addendum)
Pt was at [facility] in July with complaints of homelessness and depression and now has returned to the ED.  CSW will continue to follow for D/C needs.  [name]. Roman Sandall, LCSW, LCAS, CSI Transitions of Care Clinical Social Worker Care Coordination Department Ph: [phone]

## 2018-12-28 NOTE — Progress Notes (Signed)
CSW attempted to call the pt's RN and the pt's personal cell and the pt room phone and can not get an answer.  CSW will continue to follow for D/C needs.  [name]. Norville Dani, LCSW, LCAS, CSI Transitions of Care Clinical Social Worker Care Coordination Department Ph: [phone]

## 2018-12-28 NOTE — Progress Notes (Signed)
CSW provided pt with shelter list and counseled pt on shelters currently accepting new patients.  Pt was receptive, appreciative and thanked the CSW.  CSW will continue to follow for D/C needs.  [name]. [name], LCSW, LCAS, CSI Transitions of Care Clinical Social Worker Care Coordination Department Ph: [phone]

## 2018-12-28 NOTE — Progress Notes (Signed)
Consult request has been received. CSW attempting to follow up at present time  Brownie Nehme, LCSW, LCAS Clinical Social Worker [phone]  

## 2018-12-30 DIAGNOSIS — Z5900 Homelessness unspecified: Secondary | ICD-10-CM | POA: Insufficient documentation

## 2018-12-30 DIAGNOSIS — R45851 Suicidal ideations: Secondary | ICD-10-CM | POA: Insufficient documentation

## 2018-12-30 DIAGNOSIS — J9601 Acute respiratory failure with hypoxia: Secondary | ICD-10-CM | POA: Insufficient documentation

## 2018-12-30 DIAGNOSIS — R059 Cough, unspecified: Secondary | ICD-10-CM | POA: Insufficient documentation

## 2018-12-30 DIAGNOSIS — D638 Anemia in other chronic diseases classified elsewhere: Secondary | ICD-10-CM | POA: Insufficient documentation

## 2018-12-30 DIAGNOSIS — F1910 Other psychoactive substance abuse, uncomplicated: Secondary | ICD-10-CM | POA: Insufficient documentation

## 2019-01-17 ENCOUNTER — Other Ambulatory Visit: Payer: Self-pay | Admitting: *Deleted

## 2019-01-17 DIAGNOSIS — F17210 Nicotine dependence, cigarettes, uncomplicated: Secondary | ICD-10-CM

## 2019-01-17 DIAGNOSIS — Z122 Encounter for screening for malignant neoplasm of respiratory organs: Secondary | ICD-10-CM

## 2019-01-27 ENCOUNTER — Telehealth: Payer: Self-pay | Admitting: Acute Care

## 2019-01-27 NOTE — Telephone Encounter (Signed)
Per chart it looks like this call/message was regarding a LDCT? Rodena Piety please advise if you were contacting the pt regarding her ct.  Thanks!

## 2019-01-27 NOTE — Telephone Encounter (Signed)
The Phone # that we had for Mrs. Ostergaard was [phone] and every time I tried to call her it just rang a fast busy signal. I called her daughter Joellen Jersey this morning asking for Joellen Jersey to get Mrs. Vannatter to call us about scheduling her CT. The daughter must have text Mrs. Nesby about calling here. Mrs. Manthe wants me to call her back the middle of October to schedule the CT. I will call her back at that time

## 2019-03-25 ENCOUNTER — Emergency Department (HOSPITAL_COMMUNITY)
Admission: EM | Admit: 2019-03-25 | Discharge: 2019-03-25 | Disposition: A | Discharge status: Home / Self Care | Payer: Medicare HMO | Attending: Emergency Medicine | Admitting: Emergency Medicine

## 2019-03-25 ENCOUNTER — Emergency Department (HOSPITAL_COMMUNITY): Payer: Medicare HMO

## 2019-03-25 ENCOUNTER — Other Ambulatory Visit: Payer: Self-pay

## 2019-03-25 DIAGNOSIS — R42 Dizziness and giddiness: Secondary | ICD-10-CM | POA: Insufficient documentation

## 2019-03-25 DIAGNOSIS — Z8673 Personal history of transient ischemic attack (TIA), and cerebral infarction without residual deficits: Secondary | ICD-10-CM | POA: Insufficient documentation

## 2019-03-25 DIAGNOSIS — J449 Chronic obstructive pulmonary disease, unspecified: Secondary | ICD-10-CM | POA: Insufficient documentation

## 2019-03-25 DIAGNOSIS — E119 Type 2 diabetes mellitus without complications: Secondary | ICD-10-CM | POA: Insufficient documentation

## 2019-03-25 DIAGNOSIS — M25511 Pain in right shoulder: Secondary | ICD-10-CM | POA: Insufficient documentation

## 2019-03-25 DIAGNOSIS — Z87891 Personal history of nicotine dependence: Secondary | ICD-10-CM | POA: Insufficient documentation

## 2019-03-25 DIAGNOSIS — W19XXXA Unspecified fall, initial encounter: Secondary | ICD-10-CM

## 2019-03-25 DIAGNOSIS — Z79899 Other long term (current) drug therapy: Secondary | ICD-10-CM | POA: Insufficient documentation

## 2019-03-25 DIAGNOSIS — Y939 Activity, unspecified: Secondary | ICD-10-CM | POA: Insufficient documentation

## 2019-03-25 DIAGNOSIS — Y999 Unspecified external cause status: Secondary | ICD-10-CM | POA: Diagnosis not present

## 2019-03-25 DIAGNOSIS — Y92007 Garden or yard of unspecified non-institutional (private) residence as the place of occurrence of the external cause: Secondary | ICD-10-CM | POA: Insufficient documentation

## 2019-03-25 DIAGNOSIS — W1839XA Other fall on same level, initial encounter: Secondary | ICD-10-CM | POA: Diagnosis not present

## 2019-03-25 MED ORDER — NAPROXEN 500 MG PO TABS: 500.0000 mg | ORAL_TABLET | Freq: Two times a day (BID) | ORAL | 0 refills | Status: DC

## 2019-03-25 MED ORDER — NAPROXEN 500 MG PO TABS
500.0000 mg | ORAL_TABLET | Freq: Once | ORAL | Status: AC
  Administered 2019-03-25: 17:00:00 500 mg via ORAL
  Filled 2019-03-25: qty 1

## 2019-03-25 NOTE — Progress Notes (Addendum)
TOC CM spoke to pt at bedside. States she is currently living at [facility] [place], in housing assistance program with [employer] while her apt is getting ready on 04/01/2019. States she moved from [facility] to [place] and currently does not have a PCP. Provided pt with Brochure for [facility] and [employer]. Pt states she can get transportation also with her [employer]. Will call pt tomorrow with appt to establish with new PCP. Jonnie Finner RN CCM, [place] [facility] CM [phone]   New address [address], [place], [place] [address]

## 2019-03-25 NOTE — ED Notes (Addendum)
Patient remove neck brace against medical advice in the lobby.

## 2019-03-25 NOTE — ED Notes (Signed)
Patient ambulatory to the restroom at this time without assistance.  

## 2019-03-25 NOTE — Discharge Instructions (Signed)
The case manager will help you set up a new primary care provider to refill your other prescriptions. Thank you for allowing me to care for you today. Please return to the emergency department if you have new or worsening symptoms. Take your medications as instructed.

## 2019-03-25 NOTE — ED Triage Notes (Signed)
Per EMS, patient comes from home. Had two falls yesterday landed on right side both times. [place] [address] due to vertigo. Patient reported pain in right shoulder and arm. Cannot extend arm or flex beyond 90 degrees. AOx4. Denies any anticoag usage. Patient reported head pain to Ems which is baseline for her. Patient reported neck pain to EMS and EMS put on C COLLAR. Vitals with EMS  130/80 - pulse 90 - rr 16 - o2 room 96 CBG 106 temp 97.7

## 2019-03-25 NOTE — ED Notes (Signed)
Called pt at 2:20P. No show. Will try again shortly

## 2019-03-25 NOTE — ED Provider Notes (Signed)
[facility]-EMERGENCY DEPT Provider Note   CSN: [id number] Arrival date & time: 03/25/19  1344     History   Chief Complaint Chief Complaint  Patient presents with  . Fall  . Shoulder Pain  . hip pan    HPI Rose [name] is a 63 y.o. female.     Patient is a 63 year old homeless female with past medical history of diabetes, chronic headache, chronic dizziness, stroke, COPD presenting to the emergency department for right shoulder pain after fall.  Patient reports that yesterday she was feeling her baseline dizziness and fell x2 outside.  Reports that she landed on the grass on her right shoulder both times.  Denies hitting her head or passing out.  Denies any new acute pain.  She does complain of chronic pain in her hips and her back.  Reports that she is out of her trazodone and pain medication and has been unable to reach her primary care doctor.  Patient seems more preoccupied with the fact that she has not had her pain medicine and her trazodone than she does at her new acute injury of her shoulder.  Denies any numbness, tingling, syncope, loss of control of bowel or bladder.  Reports currently she is not feeling any dizziness at all.  Reports that she gets dizzy spells very often for "years" and that this is nothing new for her.     Past Medical History:  Diagnosis Date  . Allergy   . Asthma   . Back pain   . Chronic headaches   . Diabetes (HCC)   . Herniated lumbar intervertebral disc   . High cholesterol   . History of blood clots   . Pancreatitis   . Stroke Rose [name])     Patient Active Problem List   Diagnosis Date Noted  . Severe recurrent major depression without psychotic features (HCC) 12/12/2018  . Deformity of metatarsal bone of left foot 04/16/2015  . Pain in lower limb 03/30/2015  . Sinus tarsitis of left foot 03/30/2015  . Arthritis of ankle, left, degenerative 03/30/2015  . 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 Cigarette smoker 03/24/2015     Past Surgical History:  Procedure Laterality Date  . ANKLE SURGERY     x 3  . BREAST REDUCTION SURGERY    . CHOLECYSTECTOMY  1998  . HERNIA REPAIR    . VAGINAL HYSTERECTOMY  1988     OB History   No obstetric history on file.      Home Medications    Prior to Admission medications   Medication Sig Start Date End Date Taking? Authorizing Provider  albuterol (VENTOLIN HFA) 108 (90 Base) MCG/ACT inhaler Inhale 1-2 puffs into the lungs every 6 (six) hours as needed for wheezing or shortness of breath.   Yes [provider]  ANORO ELLIPTA 62.5-25 MCG/INH AEPB Inhale 1 puff into the lungs daily. 11/29/18  Yes [provider]  clonazePAM (KLONOPIN) 1 MG tablet Take 1 mg by mouth 3 (three) times daily as needed for anxiety.    Yes [provider]  DULoxetine (CYMBALTA) 60 MG capsule Take 1 capsule (60 mg total) by mouth 2 (two) times daily. 12/16/18  Yes Rose Baker, NP  gabapentin (NEURONTIN) 600 MG tablet Take 600 mg by mouth 2 (two) times daily. 03/05/19  Yes [provider]  hydrOXYzine (ATARAX/VISTARIL) 25 MG tablet Take 1 tablet (25 mg total) by mouth 3 (three) times daily as needed for anxiety. 12/16/18  Yes Rose [name]  E, NP  metFORMIN (GLUCOPHAGE) 500 MG tablet Take 500 mg by mouth 2 (two) times daily. 03/05/19  Yes [provider]  omega-3 acid ethyl esters (LOVAZA) 1 G capsule TK 2 CS PO BID 01/25/15  Yes [provider]  PROAIR HFA 108 (90 Base) MCG/ACT inhaler INHALE 2 PUFFS BY MOUTH EVERY 6 HOURS AS NEEDED FOR WHEEZING OR FOR SHORTNESS OF BREATH 03/19/17  Yes Rose [name], Rose Fillers, MD  rosuvastatin (CRESTOR) 20 MG tablet Take 20 mg by mouth daily. 11/08/18  Yes [provider]  traZODone (DESYREL) 100 MG tablet Take 2 tablets (200 mg total) by mouth at bedtime as needed for sleep. Patient taking differently: Take 300 mg by mouth at bedtime as needed for sleep.  12/16/18  Yes Rose Burkitt, NP  buPROPion (WELLBUTRIN XL) 150 MG  24 hr tablet Take 1 tablet (150 mg total) by mouth daily. Patient not taking: Reported on 03/25/2019 12/17/18   Rose Burkitt, NP  CREON 24000-76000 units CPEP Take 1 capsule by mouth 3 (three) times daily. 01/30/19   [provider]  estradiol (ESTRACE) 0.5 MG tablet TK 1 T PO D 01/25/15   [provider]    Family History Family History  Problem Relation Age of Onset  . Emphysema Mother   . Heart disease Mother   . Cancer Mother   . Heart disease Father   . Cancer Sister     Social History Social History   Tobacco Use  . Smoking status: Former Smoker    Packs/day: 1.00    Years: 47.00    Pack years: 47.00    Types: Cigarettes, E-cigarettes    Quit date: 04/15/2017    Years since quitting: 1.9  . Smokeless tobacco: Never Used  . Tobacco comment: 1/2 - 1 PPD -- Heavy secondhand smoke exposure  Substance Use Topics  . Alcohol use: No    Alcohol/week: 0.0 standard drinks  . Drug use: Yes    Types: Marijuana     Allergies   Erythromycin, Nickel, Oxycodone, Meperidine, Codeine, Hydrocodone-acetaminophen, Morphine, Propoxyphene, and Quetiapine   Review of Systems Review of Systems  Constitutional: Negative for appetite change, chills and fever.  Respiratory: Negative for shortness of breath.   Cardiovascular: Negative for chest pain.  Gastrointestinal: Negative for abdominal pain, nausea and vomiting.  Genitourinary: Negative for difficulty urinating and dysuria.  Musculoskeletal: Positive for arthralgias and back pain. Negative for gait problem, joint swelling, myalgias, neck pain and neck stiffness.  Skin: Negative for rash and wound.  Neurological: Positive for dizziness. Negative for tremors, syncope, speech difficulty, weakness, light-headedness and headaches.     Physical Exam Updated Vital Signs BP 140/90 (BP Location: Left Arm)   Pulse 93   Temp 98.8 F (37.1 C) (Oral)   Resp 16   Ht 5\' 3"  (1.6 m)   Wt 68 kg   SpO2 93%   BMI 26.57  kg/m   Physical Exam Vitals signs and nursing note reviewed.  Constitutional:      Appearance: Normal appearance.  HENT:     Head: Normocephalic and atraumatic.     Mouth/Throat:     Mouth: Mucous membranes are moist.  Eyes:     Conjunctiva/sclera: Conjunctivae normal.  Neck:     Musculoskeletal: Full passive range of motion without pain. Normal range of motion. No pain with movement, spinous process tenderness or muscular tenderness.  Cardiovascular:     Rate and Rhythm: Normal rate and regular rhythm.  Pulmonary:  Effort: Pulmonary effort is normal.     Breath sounds: Normal breath sounds.  Abdominal:     General: Abdomen is flat. Bowel sounds are normal.  Musculoskeletal:     Right shoulder: She exhibits decreased range of motion and tenderness. She exhibits no bony tenderness, no swelling, no effusion, no crepitus, no deformity, no laceration, no pain, no spasm, normal pulse and normal strength.     Right elbow: Normal.    Right wrist: Normal.     Comments: Pain with abduction and extension of the right shoulder  Skin:    General: Skin is dry.  Neurological:     General: No focal deficit present.     Mental Status: She is alert.  Psychiatric:        Mood and Affect: Mood normal.      ED Treatments / Results  Labs (all labs ordered are listed, but only abnormal results are displayed) Labs Reviewed - No data to display  EKG None  Radiology Dg Shoulder Right  Result Date: 03/25/2019 CLINICAL DATA:  Right shoulder pain after fall yesterday. EXAM: RIGHT SHOULDER - 2+ VIEW COMPARISON:  MRI right shoulder dated May 28, 2018. FINDINGS: No acute fracture or dislocation. Joint spaces are preserved. Small subacromial spur. Bone mineralization is normal. Soft tissues are unremarkable. IMPRESSION: Negative. Electronically Signed   By: Obie Dredge M.D.   On: 03/25/2019 14:59    Procedures Procedures (including critical care time)  Medications Ordered in ED  Medications  naproxen (NAPROSYN) tablet 500 mg (500 mg Oral Given 03/25/19 1726)     Initial Impression / Assessment and Plan / ED Course  I have reviewed the triage vital signs and the nursing notes.  Pertinent labs & imaging results that were available during my care of the patient were reviewed by me and considered in my medical decision making (see chart for details).  Clinical Course as of Mar 24 1736  Tue Mar 25, 2019  1737 Patient complaining of right shoulder pain after fall x2 yesterday.  Currently having no other symptoms.  Has baseline dizziness due to previous stroke.  She is homeless and reports having trouble following up with her primary care doctor and getting her prescription refilled.  Plain films showing no acute injury.  Patient was advised on rest, ice, compression and elevation as well as Advil treatment for pain.  Case management consult was made for the patient to help her with assistance with primary care   [KM]    Clinical Course User Index [KM] Arlyn Dunning, PA-C       Based on review of vitals, medical screening exam, lab work and/or imaging, there does not appear to be an acute, emergent etiology for the patient's symptoms. Counseled pt on good return precautions and encouraged both PCP and ED follow-up as needed.  Prior to discharge, I also discussed incidental imaging findings with patient in detail and advised appropriate, recommended follow-up in detail.  Clinical Impression: 1. Fall, initial encounter   2. Acute pain of right shoulder     Disposition: Discharge  Prior to providing a prescription for a controlled substance, I independently reviewed the patient's recent prescription history on the [place] Controlled Substance Reporting System. The patient had no recent or regular prescriptions and was deemed appropriate for a brief, less than 3 day prescription of narcotic for acute analgesia.  This note was prepared with assistance of  Conservation officer, historic buildings. Occasional wrong-word or sound-a-like substitutions may have occurred due  to the inherent limitations of voice recognition software.   Final Clinical Impressions(s) / ED Diagnoses   Final diagnoses:  None    ED Discharge Orders    None       Jeral PinchMcLean, Fredric Slabach A, PA-C 03/25/19 2338    Lorre NickAllen, Anthony, MD 03/27/19 1343

## 2019-03-26 ENCOUNTER — Telehealth: Payer: Self-pay | Admitting: *Deleted

## 2019-03-26 NOTE — Telephone Encounter (Signed)
TOC CM contacted pt and provided her with information on new PCP appt on 04/17/2019 at 930 am at [facility]. Rose [name], [place] ED TOC CM [phone]

## 2019-04-17 ENCOUNTER — Other Ambulatory Visit: Payer: Self-pay

## 2019-04-17 ENCOUNTER — Encounter (INDEPENDENT_AMBULATORY_CARE_PROVIDER_SITE_OTHER): Payer: Self-pay | Admitting: Primary Care

## 2019-04-17 ENCOUNTER — Ambulatory Visit (INDEPENDENT_AMBULATORY_CARE_PROVIDER_SITE_OTHER): Payer: Medicare HMO | Admitting: Primary Care

## 2019-04-17 DIAGNOSIS — Z09 Encounter for follow-up examination after completed treatment for conditions other than malignant neoplasm: Secondary | ICD-10-CM

## 2019-04-17 NOTE — Progress Notes (Signed)
Ms. Lunden Stieber has PCP and she will continue to follow up with them. She decided since the provider would not prescribe narcotics or benzo she would keep her present provider. Patient ended visit.

## 2019-08-25 ENCOUNTER — Ambulatory Visit: Payer: Medicare Other | Attending: Internal Medicine

## 2019-09-04 ENCOUNTER — Ambulatory Visit: Payer: Medicare Other | Attending: Internal Medicine

## 2019-09-04 DIAGNOSIS — Z23 Encounter for immunization: Secondary | ICD-10-CM

## 2019-09-04 NOTE — Progress Notes (Signed)
   Covid-19 Vaccination Clinic  Name:  Rose [name]    MRN: [id number] DOB: August 11, 1955  09/04/2019  [name] was observed post Covid-19 immunization for 30 minutes based on pre-vaccination screening without incident. She was provided with Vaccine Information Sheet and instruction to access the V-Safe system.   [name] was instructed to call 911 with any severe reactions post vaccine: Marland Kitchen Difficulty breathing  . Swelling of face and throat  . A fast heartbeat  . A bad rash all over body  . Dizziness and weakness   Immunizations Administered    Name Date Dose VIS Date Route   Pfizer COVID-19 Vaccine 09/04/2019  4:06 PM 0.3 mL 05/09/2019 Intramuscular   Manufacturer: [employer], [employer]   Lot: [id number]   NDC: [address]-4

## 2019-10-01 ENCOUNTER — Ambulatory Visit: Payer: Medicare (Managed Care) | Attending: Internal Medicine

## 2019-10-01 DIAGNOSIS — Z23 Encounter for immunization: Secondary | ICD-10-CM

## 2019-10-01 NOTE — Progress Notes (Signed)
   Covid-19 Vaccination Clinic  Name:  Rose [name]    MRN: [id number] DOB: 1955/12/19  10/01/2019  [name] was observed post Covid-19 immunization for 15 minutes without incident. She was provided with Vaccine Information Sheet and instruction to access the V-Safe system.   [name] was instructed to call 911 with any severe reactions post vaccine: Marland Kitchen Difficulty breathing  . Swelling of face and throat  . A fast heartbeat  . A bad rash all over body  . Dizziness and weakness   Immunizations Administered    Name Date Dose VIS Date Route   Pfizer COVID-19 Vaccine 10/01/2019  3:36 PM 0.3 mL 07/23/2018 Intramuscular   Manufacturer: [employer], [employer]   Lot: [id number]   NDC: [address]-0

## 2019-11-04 ENCOUNTER — Ambulatory Visit: Payer: Medicare (Managed Care) | Attending: Nurse Practitioner | Admitting: Nurse Practitioner

## 2019-11-04 ENCOUNTER — Other Ambulatory Visit: Payer: Self-pay

## 2019-12-24 ENCOUNTER — Ambulatory Visit: Payer: Medicare (Managed Care) | Attending: Family Medicine | Admitting: Family Medicine

## 2019-12-24 ENCOUNTER — Other Ambulatory Visit: Payer: Self-pay

## 2020-08-31 ENCOUNTER — Other Ambulatory Visit: Payer: Self-pay | Admitting: Internal Medicine

## 2020-08-31 DIAGNOSIS — Z122 Encounter for screening for malignant neoplasm of respiratory organs: Secondary | ICD-10-CM

## 2020-09-15 ENCOUNTER — Ambulatory Visit: Payer: Self-pay

## 2020-09-15 ENCOUNTER — Ambulatory Visit (INDEPENDENT_AMBULATORY_CARE_PROVIDER_SITE_OTHER): Payer: Medicare HMO | Admitting: Podiatry

## 2020-09-15 ENCOUNTER — Other Ambulatory Visit: Payer: Self-pay

## 2020-09-15 DIAGNOSIS — I059 Rheumatic mitral valve disease, unspecified: Secondary | ICD-10-CM | POA: Insufficient documentation

## 2020-09-15 DIAGNOSIS — L80 Vitiligo: Secondary | ICD-10-CM | POA: Insufficient documentation

## 2020-09-15 DIAGNOSIS — M2042 Other hammer toe(s) (acquired), left foot: Secondary | ICD-10-CM

## 2020-09-15 DIAGNOSIS — Z9181 History of falling: Secondary | ICD-10-CM | POA: Insufficient documentation

## 2020-09-15 DIAGNOSIS — M51369 Other intervertebral disc degeneration, lumbar region without mention of lumbar back pain or lower extremity pain: Secondary | ICD-10-CM | POA: Insufficient documentation

## 2020-09-15 DIAGNOSIS — M2041 Other hammer toe(s) (acquired), right foot: Secondary | ICD-10-CM | POA: Diagnosis not present

## 2020-09-15 DIAGNOSIS — K760 Fatty (change of) liver, not elsewhere classified: Secondary | ICD-10-CM | POA: Insufficient documentation

## 2020-09-15 DIAGNOSIS — Q828 Other specified congenital malformations of skin: Secondary | ICD-10-CM

## 2020-09-15 DIAGNOSIS — M7752 Other enthesopathy of left foot: Secondary | ICD-10-CM

## 2020-09-15 DIAGNOSIS — Z889 Allergy status to unspecified drugs, medicaments and biological substances status: Secondary | ICD-10-CM | POA: Insufficient documentation

## 2020-09-15 DIAGNOSIS — F419 Anxiety disorder, unspecified: Secondary | ICD-10-CM | POA: Insufficient documentation

## 2020-09-15 DIAGNOSIS — M069 Rheumatoid arthritis, unspecified: Secondary | ICD-10-CM

## 2020-09-15 DIAGNOSIS — I340 Nonrheumatic mitral (valve) insufficiency: Secondary | ICD-10-CM | POA: Insufficient documentation

## 2020-09-15 DIAGNOSIS — Z7409 Other reduced mobility: Secondary | ICD-10-CM | POA: Insufficient documentation

## 2020-09-15 DIAGNOSIS — R0602 Shortness of breath: Secondary | ICD-10-CM | POA: Insufficient documentation

## 2020-09-15 DIAGNOSIS — M778 Other enthesopathies, not elsewhere classified: Secondary | ICD-10-CM

## 2020-09-15 DIAGNOSIS — M5136 Other intervertebral disc degeneration, lumbar region: Secondary | ICD-10-CM | POA: Insufficient documentation

## 2020-09-15 DIAGNOSIS — K5900 Constipation, unspecified: Secondary | ICD-10-CM | POA: Insufficient documentation

## 2020-09-15 DIAGNOSIS — I639 Cerebral infarction, unspecified: Secondary | ICD-10-CM | POA: Insufficient documentation

## 2020-09-15 DIAGNOSIS — K439 Ventral hernia without obstruction or gangrene: Secondary | ICD-10-CM | POA: Insufficient documentation

## 2020-09-15 DIAGNOSIS — M1990 Unspecified osteoarthritis, unspecified site: Secondary | ICD-10-CM | POA: Insufficient documentation

## 2020-09-15 DIAGNOSIS — R748 Abnormal levels of other serum enzymes: Secondary | ICD-10-CM | POA: Insufficient documentation

## 2020-09-15 NOTE — Progress Notes (Signed)
Subjective:  Patient ID: Rose [name], female    DOB: 05/26/1956,  MRN: [id number]  Chief Complaint  Patient presents with  . Foot Problem    Lesion at Rt bottom heel x 1 mo, very sore when walking Tx: trimming  . Pain    BL plantar, dorsal and ankle pain x years; 10/10 ocnstatn pain -w/ swelling Tx: custome inserts    65 y.o. female presents with the above complaint.  Patient presents with complaint of bilateral foot pain with left ankle pain and right heel pain.  Patient states is painful to walk on it.  She states that is 10 out of 10 constantly pain swelling she is try some custom orthotics which helped some but not as much.  She would like to know if she can also get diabetic shoes she is diabetic with controlled A1c.  She denies any other acute complaints.  She has not seen anyone else prior to seeing me for this.  She had a history of ankle surgery to the right ankle in the past.   Review of Systems: Negative except as noted in the HPI. Denies N/V/F/Ch.  Past Medical History:  Diagnosis Date  . Allergy   . Asthma   . Back pain   . Chronic headaches   . Diabetes (HCC)   . Herniated lumbar intervertebral disc   . High cholesterol   . History of blood clots   . Pancreatitis   . Stroke [facility])     Current Outpatient Medications:  .  albuterol (VENTOLIN HFA) 108 (90 Base) MCG/ACT inhaler, Inhale 1-2 puffs into the lungs every 6 (six) hours as needed for wheezing or shortness of breath., Disp: , Rfl:  .  ANORO ELLIPTA 62.5-25 MCG/INH AEPB, Inhale 1 puff into the lungs daily., Disp: , Rfl:  .  buPROPion (WELLBUTRIN XL) 150 MG 24 hr tablet, Take 1 tablet (150 mg total) by mouth daily. (Patient not taking: Reported on 03/25/2019), Disp: 30 tablet, Rfl: 0 .  clonazePAM (KLONOPIN) 1 MG tablet, Take 1 mg by mouth 3 (three) times daily as needed for anxiety. , Disp: , Rfl:  .  CREON 24000-76000 units CPEP, Take 1 capsule by mouth 3 (three) times daily., Disp: , Rfl:  .  DULoxetine  (CYMBALTA) 60 MG capsule, Take 1 capsule (60 mg total) by mouth 2 (two) times daily., Disp: 60 capsule, Rfl: 0 .  estradiol (ESTRACE) 0.5 MG tablet, TK 1 T PO D, Disp: , Rfl: 1 .  gabapentin (NEURONTIN) 600 MG tablet, Take 600 mg by mouth 2 (two) times daily., Disp: , Rfl:  .  HYDROcodone-acetaminophen (NORCO) 10-325 MG tablet, Take 1 tablet by mouth every 4 (four) hours as needed for moderate pain or severe pain. , Disp: , Rfl:  .  hydrOXYzine (ATARAX/VISTARIL) 25 MG tablet, Take 1 tablet (25 mg total) by mouth 3 (three) times daily as needed for anxiety., Disp: 30 tablet, Rfl: 0 .  metFORMIN (GLUCOPHAGE) 500 MG tablet, Take 500 mg by mouth 2 (two) times daily., Disp: , Rfl:  .  omega-3 acid ethyl esters (LOVAZA) 1 G capsule, TK 2 CS PO BID, Disp: , Rfl: 6 .  PROAIR HFA 108 (90 Base) MCG/ACT inhaler, INHALE 2 PUFFS BY MOUTH EVERY 6 HOURS AS NEEDED FOR WHEEZING OR FOR SHORTNESS OF BREATH, Disp: 9 each, Rfl: 0 .  rosuvastatin (CRESTOR) 20 MG tablet, Take 20 mg by mouth daily., Disp: , Rfl:  .  traZODone (DESYREL) 100 MG tablet, Take 2 tablets (200  mg total) by mouth at bedtime as needed for sleep. (Patient not taking: Reported on 04/17/2019), Disp: 60 tablet, Rfl: 0  Social History   Tobacco Use  Smoking Status Current Every Day Smoker  . Packs/day: 1.00  . Years: 47.00  . Pack years: 47.00  . Types: Cigarettes, E-cigarettes  . Last attempt to quit: 04/15/2017  . Years since quitting: 3.4  Smokeless Tobacco Never Used  Tobacco Comment   1/2 - 1 PPD -- Heavy secondhand smoke exposure    Allergies  Allergen Reactions  . Erythromycin Shortness Of Breath  . Nickel Rash and Shortness Of Breath  . Oxycodone Shortness Of Breath  . Meperidine Other (See Comments)    Confusion and tingling  (noted in H&P) Confusion and tingling  (noted in H&P)   . Codeine Swelling  . Hydrocodone-Acetaminophen Itching    Pt reports she can take the 7.5-325 strength.    . Morphine Itching  . Propoxyphene  Other (See Comments)  . Quetiapine     Other reaction(s): Other (See Comments) Restless leg syndrome   Objective:  There were no vitals filed for this visit. There is no height or weight on file to calculate BMI. Constitutional Well developed. Well nourished.  Vascular Dorsalis pedis pulses palpable bilaterally. Posterior tibial pulses palpable bilaterally. Capillary refill normal to all digits.  No cyanosis or clubbing noted. Pedal hair growth normal.  Neurologic Normal speech. Oriented to person, place, and time. Epicritic sensation to light touch grossly present bilaterally.  Dermatologic Nails well groomed and normal in appearance. No open wounds. No skin lesions.  Orthopedic:  Pain on palpation to the right medial arch foot.  Benign skin lesion noted with central nucleated core.  The lesion was debrided down to healthy striated tissue no pinpoint bleeding noted.  No wound noted.  Pain with range of motion of the left ankle joint active and passive.  Pain with palpation to the ankle joint lateral gutter.  Mild pain at the ATFL ligament.  No pain at the peroneal tendon posterior tibial tendon and Achilles tendon.    Bilateral hammertoe contracture semiflexible in nature noted to 2 through 5.  Mild pain on palpation.   Radiographs: 3 views of skeletally mature adult bilateral foot: Generalized osteopenia noted to both lower extremity.  No signs of fractures noted.  Osteoarthritic changes noted to the ankle of the subtalar joint talonavicular joint calcaneocuboid joint bilaterally.  No osteochondral lesions noted. Assessment:   1. Capsulitis of left ankle   2. Capsulitis of right foot   3. Porokeratosis   4. Hammertoe, bilateral    Plan:  Patient was evaluated and treated and all questions answered.  Right heel capsulitis with underlying porokeratosis -I explained to the patient etiology of porokeratosis are some options were discussed.  Given the amount of pain she is having  I believe should benefit from a steroid injection followed by debridement of the lesion.  Patient agrees with plan like to proceed with that. -A steroid injection was performed at right medial heel using 1% plain Lidocaine and 10 mg of Kenalog. This was well tolerated.  Left ankle capsulitis -I explained the patient the etiology of capsulitis worse treatment options were discussed.  Given the amount of pain she is having I believe she will benefit from a steroid injection. -A steroid injection was performed at Left ankle joint using 1% plain Lidocaine and 10 mg of Kenalog. This was well tolerated.  Hammertoe bilateral 2 through 5 -Given the patient is  neuropathic from unknown etiology and with foot contracture she may benefit from diabetic shoes to prevent ulceration.  She will be scheduled to see the orthotics department for diabetic shoes.    No follow-ups on file.

## 2020-09-16 ENCOUNTER — Encounter: Payer: Self-pay | Admitting: Podiatry

## 2020-09-21 ENCOUNTER — Telehealth: Payer: Self-pay | Admitting: Podiatry

## 2020-09-21 NOTE — Telephone Encounter (Signed)
Left message for pt to call to schedule an appt to be measured for diabetic shoes/inserts.

## 2020-09-30 NOTE — Progress Notes (Signed)
This encounter was created in error - please disregard.

## 2020-10-01 ENCOUNTER — Encounter: Payer: Medicare HMO | Admitting: Cardiovascular Disease

## 2020-10-04 ENCOUNTER — Ambulatory Visit
Admission: RE | Admit: 2020-10-04 | Discharge: 2020-10-04 | Disposition: A | Discharge status: Home / Self Care | Payer: Medicare HMO | Source: Ambulatory Visit | Attending: Internal Medicine | Admitting: Internal Medicine

## 2020-10-04 DIAGNOSIS — Z122 Encounter for screening for malignant neoplasm of respiratory organs: Secondary | ICD-10-CM

## 2020-10-12 ENCOUNTER — Telehealth (INDEPENDENT_AMBULATORY_CARE_PROVIDER_SITE_OTHER): Payer: Medicare HMO | Admitting: Primary Care

## 2020-10-12 ENCOUNTER — Other Ambulatory Visit: Payer: Self-pay

## 2020-10-27 ENCOUNTER — Ambulatory Visit: Payer: Medicare HMO | Admitting: Podiatry

## 2020-10-29 ENCOUNTER — Emergency Department (HOSPITAL_COMMUNITY)
Admission: EM | Admit: 2020-10-29 | Discharge: 2020-10-30 | Disposition: A | Discharge status: Home / Self Care | Payer: Medicare HMO | Attending: Emergency Medicine | Admitting: Emergency Medicine

## 2020-10-29 DIAGNOSIS — F17210 Nicotine dependence, cigarettes, uncomplicated: Secondary | ICD-10-CM | POA: Insufficient documentation

## 2020-10-29 DIAGNOSIS — Z7951 Long term (current) use of inhaled steroids: Secondary | ICD-10-CM | POA: Insufficient documentation

## 2020-10-29 DIAGNOSIS — E119 Type 2 diabetes mellitus without complications: Secondary | ICD-10-CM | POA: Diagnosis not present

## 2020-10-29 DIAGNOSIS — H9201 Otalgia, right ear: Secondary | ICD-10-CM | POA: Diagnosis present

## 2020-10-29 DIAGNOSIS — R42 Dizziness and giddiness: Secondary | ICD-10-CM | POA: Diagnosis not present

## 2020-10-29 DIAGNOSIS — R519 Headache, unspecified: Secondary | ICD-10-CM

## 2020-10-29 DIAGNOSIS — J449 Chronic obstructive pulmonary disease, unspecified: Secondary | ICD-10-CM | POA: Diagnosis not present

## 2020-10-30 ENCOUNTER — Other Ambulatory Visit: Payer: Self-pay

## 2020-10-30 ENCOUNTER — Encounter (HOSPITAL_COMMUNITY): Payer: Self-pay | Admitting: Emergency Medicine

## 2020-10-30 ENCOUNTER — Emergency Department (HOSPITAL_COMMUNITY): Payer: Medicare HMO

## 2020-10-30 DIAGNOSIS — H9201 Otalgia, right ear: Secondary | ICD-10-CM | POA: Diagnosis not present

## 2020-10-30 LAB — BASIC METABOLIC PANEL
Anion gap: 8 (ref 5–15)
BUN: 14 mg/dL (ref 8–23)
CO2: 25 mmol/L (ref 22–32)
Calcium: 8.7 mg/dL — ABNORMAL LOW (ref 8.9–10.3)
Chloride: 104 mmol/L (ref 98–111)
Creatinine, Ser: 0.88 mg/dL (ref 0.44–1.00)
GFR, Estimated: >60 mL/min (ref >60)
Glucose, Bld: 135 mg/dL — ABNORMAL HIGH (ref 70–99)
Potassium: 4.1 mmol/L (ref 3.5–5.1)
Sodium: 137 mmol/L (ref 135–145)

## 2020-10-30 LAB — CBC WITH DIFFERENTIAL/PLATELET
Abs Immature Granulocytes: 0.02 10*3/uL (ref 0.00–0.07)
Basophils Absolute: 0.1 10*3/uL (ref 0.0–0.1)
Basophils Relative: 1 %
Eosinophils Absolute: 0.2 10*3/uL (ref 0.0–0.5)
Eosinophils Relative: 2 %
HCT: 38.4 % (ref 36.0–46.0)
Hemoglobin: 12.6 g/dL (ref 12.0–15.0)
Immature Granulocytes: 0 %
Lymphocytes Relative: 18 %
Lymphs Abs: 1.3 10*3/uL (ref 0.7–4.0)
MCH: 28 pg (ref 26.0–34.0)
MCHC: 32.8 g/dL (ref 30.0–36.0)
MCV: 85.3 fL (ref 80.0–100.0)
Monocytes Absolute: 0.4 10*3/uL (ref 0.1–1.0)
Monocytes Relative: 6 %
Neutro Abs: 5.2 10*3/uL (ref 1.7–7.7)
Neutrophils Relative %: 73 %
Platelets: 233 10*3/uL (ref 150–400)
RBC: 4.5 MIL/uL (ref 3.87–5.11)
RDW: 15.9 % — ABNORMAL HIGH (ref 11.5–15.5)
WBC: 7.1 10*3/uL (ref 4.0–10.5)
nRBC: 0 % (ref 0.0–0.2)

## 2020-10-30 LAB — URINALYSIS, ROUTINE W REFLEX MICROSCOPIC
Bilirubin Urine: NEGATIVE
Glucose, UA: NEGATIVE mg/dL
Ketones, ur: NEGATIVE mg/dL
Leukocytes,Ua: NEGATIVE
Nitrite: NEGATIVE
Protein, ur: NEGATIVE mg/dL
Specific Gravity, Urine: 1.012 (ref 1.005–1.030)
pH: 5 (ref 5.0–8.0)

## 2020-10-30 MED ORDER — AMOXICILLIN 500 MG PO CAPS: 1000.0000 mg | ORAL_CAPSULE | Freq: Two times a day (BID) | ORAL | 0 refills | Status: DC

## 2020-10-30 NOTE — ED Notes (Signed)
E-signature pad unavailable at time of pt discharge. This RN discussed discharge materials with pt and answered all pt questions. Pt stated understanding of discharge material. ? ?

## 2020-10-30 NOTE — ED Provider Notes (Signed)
[facility] EMERGENCY DEPARTMENT Provider Note   CSN: [id number] Arrival date & time: 10/29/20  2353     History Chief Complaint  Patient presents with  . Headache    Rose [name] is a 65 y.o. female.  Patient presents to the emergency department with multiple complaints.  Most of her complaints seem to be centered around the right ear.  She has been having pain on the right side of her head, pain in her right ear, decreased hearing with a small lump under the ear.  She has been experiencing dizziness and feeling off balance.  At times she has been experiencing diaphoresis but this has been ongoing for very long time.  No associated chest pain or shortness of breath.        Past Medical History:  Diagnosis Date  . Allergy   . Asthma   . Back pain   . Chronic headaches   . Class 1 obesity due to excess calories with serious comorbidity in adult   . COPD (chronic obstructive pulmonary disease) (HCC)   . Diabetes (HCC)   . Headache   . Herniated lumbar intervertebral disc   . High cholesterol   . History of blood clots   . Lumbago with sciatica, unspecified side   . Moderate episode of recurrent major depressive disorder (HCC)   . Nicotine dependence, cigarettes, with other nicotine-induced disorders   . Nonrheumatic aortic (valve) insufficiency   . Other chronic pain   . Pain in joint involving ankle and foot   . Pancreatitis   . Presence urogenital implant   . Stroke (HCC)   . SUI (stress urinary incontinence, female)   . Type 2 diabetes mellitus with hyperglycemia, with long-term current use of insulin [facility])     Patient Active Problem List   Diagnosis Date Noted  . Anxiety 09/15/2020  . Constipation 09/15/2020  . DDD (degenerative disc disease), lumbar 09/15/2020  . Elevated liver enzymes 09/15/2020  . Fatty liver 09/15/2020  . Mitral valve problem 09/15/2020  . Mobility impaired 09/15/2020  . Multiple allergies 09/15/2020  . Arthritis  09/15/2020  . Risk for falls 09/15/2020  . Shortness of breath 09/15/2020  . Stroke (HCC) 09/15/2020  . Ventral hernia 09/15/2020  . Vitiligo 09/15/2020  . Acute hypoxemic respiratory failure (HCC) 12/30/2018  . Anemia of chronic disease 12/30/2018  . Cough 12/30/2018  . Homelessness 12/30/2018  . Polysubstance abuse (HCC) 12/30/2018  . Suicidal ideation 12/30/2018  . Severe recurrent major depression without psychotic features (HCC) 12/12/2018  . Chronic hoarseness 02/26/2018  . Parotid nodule 02/26/2018  . Parotid sialolithiasis 02/26/2018  . Generalized abdominal pain 05/11/2016  . GERD (gastroesophageal reflux disease) 05/11/2016  . Hematochezia 05/11/2016  . History of colon polyps 05/11/2016  . Nausea 05/11/2016  . ADD (attention deficit disorder) 03/24/2016  . Complaints of memory disturbance 03/24/2016  . Depression 03/24/2016  . Eczematous dermatitis of upper eyelids of both eyes 01/19/2016  . Regular astigmatism of both eyes 01/19/2016  . Stress at home 11/16/2015  . Deformity of metatarsal bone of left foot 04/16/2015  . Pain in lower limb 03/30/2015  . Sinus tarsitis of left foot 03/30/2015  . Arthritis of ankle, left, degenerative 03/30/2015  . 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 Cigarette smoker 03/24/2015  . Trochanteric bursitis of left hip 07/20/2014  . Osteoarthritis of right subtalar joint 05/07/2014  . Pain due to any device, implant or graft 05/07/2014  . Post-traumatic osteoarthritis of right ankle 05/07/2014  .  Achilles tendinitis of left lower extremity 05/07/2014  . Tobacco abuse 05/07/2014  . Nickel allergy 02/07/2014  . History of total knee arthroplasty 02/07/2014  . Chronic calcific pancreatitis (HCC) 09/29/2013  . Pancreatic cyst 09/29/2013  . Type 2 diabetes mellitus without complications (HCC) 09/29/2013    Past Surgical History:  Procedure Laterality Date  . ANKLE SURGERY     x 3  . BREAST REDUCTION SURGERY    . CHOLECYSTECTOMY  1998  . HERNIA  REPAIR    . VAGINAL HYSTERECTOMY  1988     OB History   No obstetric history on file.     Family History  Problem Relation Age of Onset  . Emphysema Mother   . Heart disease Mother   . Cancer Mother   . Heart disease Father   . Cancer Sister     Social History   Tobacco Use  . Smoking status: Current Every Day Smoker    Packs/day: 1.00    Years: 47.00    Pack years: 47.00    Types: Cigarettes, E-cigarettes    Last attempt to quit: 04/15/2017    Years since quitting: 3.5  . Smokeless tobacco: Never Used  . Tobacco comment: 1/2 - 1 PPD -- Heavy secondhand smoke exposure  Vaping Use  . Vaping Use: Former  Substance Use Topics  . Alcohol use: No    Alcohol/week: 0.0 standard drinks  . Drug use: Yes    Types: Marijuana    Home Medications Prior to Admission medications   Medication Sig Start Date End Date Taking? Authorizing Provider  amoxicillin (AMOXIL) 500 MG capsule Take 2 capsules (1,000 mg total) by mouth 2 (two) times daily. 10/30/20  Yes Marvetta Vohs, Canary Brim, MD  albuterol (VENTOLIN HFA) 108 (90 Base) MCG/ACT inhaler Inhale 1-2 puffs into the lungs every 6 (six) hours as needed for wheezing or shortness of breath.    [provider]  ANORO ELLIPTA 62.5-25 MCG/INH AEPB Inhale 1 puff into the lungs daily. 11/29/18   [provider]  buPROPion (WELLBUTRIN XL) 150 MG 24 hr tablet Take 1 tablet (150 mg total) by mouth daily. Patient not taking: Reported on 03/25/2019 12/17/18   [name], NP  clonazePAM (KLONOPIN) 1 MG tablet Take 1 mg by mouth 3 (three) times daily as needed for anxiety.     [provider]  clotrimazole-betamethasone (LOTRISONE) cream Apply 1 application topically 2 (two) times daily.    [provider]  CREON 24000-76000 units CPEP Take 1 capsule by mouth 3 (three) times daily. 01/30/19   [provider]  DULoxetine (CYMBALTA) 60 MG capsule Take 1 capsule (60 mg total) by mouth 2 (two) times daily.  12/16/18   [name], NP  estradiol (ESTRACE) 0.5 MG tablet TK 1 T PO D 01/25/15   [provider]  famotidine (PEPCID) 40 MG tablet Take 40 mg by mouth daily.    [provider]  fluticasone-salmeterol (ADVAIR HFA) 115-21 MCG/ACT inhaler Inhale 2 puffs into the lungs 2 (two) times daily.    [provider]  gabapentin (NEURONTIN) 600 MG tablet Take 600 mg by mouth 2 (two) times daily. 03/05/19   [provider]  HYDROcodone-acetaminophen (NORCO) 10-325 MG tablet Take 1 tablet by mouth every 4 (four) hours as needed for moderate pain or severe pain.  03/13/19   [provider]  hydrOXYzine (ATARAX/VISTARIL) 25 MG tablet Take 1 tablet (25 mg total) by mouth 3 (three) times daily as needed for anxiety.  12/16/18   [name], NP  metFORMIN (GLUCOPHAGE) 500 MG tablet Take 500 mg by mouth 2 (two) times daily. 03/05/19   [provider]  nicotine (NICODERM CQ - DOSED IN MG/24 HOURS) 21 mg/24hr patch Place 21 mg onto the skin daily.    [provider]  omega-3 acid ethyl esters (LOVAZA) 1 G capsule TK 2 CS PO BID 01/25/15   [provider]  ondansetron (ZOFRAN) 8 MG tablet Take by mouth every 8 (eight) hours as needed for nausea or vomiting.    [provider]  PROAIR HFA 108 (90 Base) MCG/ACT inhaler INHALE 2 PUFFS BY MOUTH EVERY 6 HOURS AS NEEDED FOR WHEEZING OR FOR SHORTNESS OF BREATH 03/19/17   Leslye Peer, MD  rosuvastatin (CRESTOR) 20 MG tablet Take 20 mg by mouth daily. 11/08/18   [provider]  sucralfate (CARAFATE) 1 g tablet Take 1 g by mouth 2 (two) times daily.    [provider]  traZODone (DESYREL) 100 MG tablet Take 2 tablets (200 mg total) by mouth at bedtime as needed for sleep. Patient not taking: Reported on 04/17/2019 12/16/18   [name], NP    Allergies    Erythromycin, Nickel, Oxycodone, Meperidine, Codeine, Hydrocodone-acetaminophen, Morphine, Propoxyphene, and  Quetiapine  Review of Systems   Review of Systems  Constitutional: Positive for diaphoresis.  HENT: Positive for ear pain and hearing loss.   Neurological: Positive for dizziness and headaches.  All other systems reviewed and are negative.   Physical Exam Updated Vital Signs BP 133/78 (BP Location: Left Arm)   Pulse 98   Temp 98.1 F (36.7 C) (Oral)   Resp 18   Ht  (1.626 m)   Wt 85.3 kg   SpO2 96%   BMI 32.27 kg/m   Physical Exam Vitals and nursing note reviewed.  Constitutional:      General: She is not in acute distress.    Appearance: Normal appearance. She is well-developed.  HENT:     Head: Normocephalic and atraumatic.     Right Ear: Hearing normal.     Left Ear: Hearing normal.     Nose: Nose normal.  Eyes:     Conjunctiva/sclera: Conjunctivae normal.     Pupils: Pupils are equal, round, and reactive to light.  Cardiovascular:     Rate and Rhythm: Regular rhythm.     Heart sounds: S1 normal and S2 normal. No murmur heard. No friction rub. No gallop.   Pulmonary:     Effort: Pulmonary effort is normal. No respiratory distress.     Breath sounds: Normal breath sounds.  Chest:     Chest wall: No tenderness.  Abdominal:     General: Bowel sounds are normal.     Palpations: Abdomen is soft.     Tenderness: There is no abdominal tenderness. There is no guarding or rebound. Negative signs include Murphy's sign and McBurney's sign.     Hernia: No hernia is present.  Musculoskeletal:        General: Normal range of motion.     Cervical back: Normal range of motion and neck supple.  Skin:    General: Skin is warm and dry.     Findings: No rash.  Neurological:     Mental Status: She is alert and oriented to person, place, and time.     GCS: GCS eye subscore is 4. GCS verbal subscore is 5. GCS motor subscore is 6.     Cranial  Nerves: No cranial nerve deficit.     Sensory: No sensory deficit.     Coordination: Coordination normal.     Comments:  Extraocular muscle movement: normal No visual field cut Pupils: equal and reactive both direct and consensual response is normal No nystagmus present    Sensory function is intact to light touch, pinprick Proprioception intact  Grip strength 5/5 symmetric in upper extremities No pronator drift Normal finger to nose bilaterally  Lower extremity strength 5/5 against gravity Normal heel to shin bilaterally  Gait: normal   Psychiatric:        Speech: Speech normal.        Behavior: Behavior normal.        Thought Content: Thought content normal.     ED Results / Procedures / Treatments   Labs (all labs ordered are listed, but only abnormal results are displayed) Labs Reviewed  CBC WITH DIFFERENTIAL/PLATELET - Abnormal; Notable for the following components:      Result Value   RDW 15.9 (*)    All other components within normal limits  BASIC METABOLIC PANEL - Abnormal; Notable for the following components:   Glucose, Bld 135 (*)    Calcium 8.7 (*)    All other components within normal limits  URINALYSIS, ROUTINE W REFLEX MICROSCOPIC - Abnormal; Notable for the following components:   APPearance HAZY (*)    Hgb urine dipstick SMALL (*)    Bacteria, UA RARE (*)    All other components within normal limits    EKG EKG Interpretation  Date/Time:  Saturday October 30 2020 00:11:43 EDT Ventricular Rate:  95 PR Interval:  166 QRS Duration: 80 QT Interval:  374 QTC Calculation: 469 R Axis:   34 Text Interpretation: Normal sinus rhythm Possible Left atrial enlargement Borderline ECG Confirmed by Gilda Crease [phone]) on 10/30/2020 2:07:12 AM   Radiology CT Head Wo Contrast  Result Date: 10/30/2020 CLINICAL DATA:  Head trauma with headaches and multiple falls EXAM: CT HEAD WITHOUT CONTRAST CT CERVICAL SPINE WITHOUT CONTRAST TECHNIQUE: Multidetector CT imaging of the head and cervical spine was performed following the standard protocol without intravenous contrast.  Multiplanar CT image reconstructions of the cervical spine were also generated. COMPARISON:  None. FINDINGS: CT HEAD FINDINGS Brain: There is no mass, hemorrhage or extra-axial collection. The size and configuration of the ventricles and extra-axial CSF spaces are normal. The brain parenchyma is normal, without evidence of acute or chronic infarction. Vascular: No abnormal hyperdensity of the major intracranial arteries or dural venous sinuses. No intracranial atherosclerosis. Skull: The visualized skull base, calvarium and extracranial soft tissues are normal. Sinuses/Orbits: No fluid levels or advanced mucosal thickening of the visualized paranasal sinuses. No mastoid or middle ear effusion. The orbits are normal. CT CERVICAL SPINE FINDINGS Alignment: No static subluxation. Facets are aligned. Occipital condyles are normally positioned. Skull base and vertebrae: No acute fracture. Soft tissues and spinal canal: No prevertebral fluid or swelling. No visible canal hematoma. Disc levels: No advanced spinal canal or neural foraminal stenosis. Upper chest: No pneumothorax, pulmonary nodule or pleural effusion. Other: Normal visualized paraspinal cervical soft tissues. IMPRESSION: 1. No acute intracranial abnormality. 2. No acute fracture or static subluxation of the cervical spine. Electronically Signed   By: Deatra Robinson M.D.   On: 10/30/2020 01:30   CT Cervical Spine Wo Contrast  Result Date: 10/30/2020 CLINICAL DATA:  Head trauma with headaches and multiple falls EXAM: CT HEAD WITHOUT CONTRAST CT CERVICAL SPINE WITHOUT CONTRAST TECHNIQUE: Multidetector CT  imaging of the head and cervical spine was performed following the standard protocol without intravenous contrast. Multiplanar CT image reconstructions of the cervical spine were also generated. COMPARISON:  None. FINDINGS: CT HEAD FINDINGS Brain: There is no mass, hemorrhage or extra-axial collection. The size and configuration of the ventricles and  extra-axial CSF spaces are normal. The brain parenchyma is normal, without evidence of acute or chronic infarction. Vascular: No abnormal hyperdensity of the major intracranial arteries or dural venous sinuses. No intracranial atherosclerosis. Skull: The visualized skull base, calvarium and extracranial soft tissues are normal. Sinuses/Orbits: No fluid levels or advanced mucosal thickening of the visualized paranasal sinuses. No mastoid or middle ear effusion. The orbits are normal. CT CERVICAL SPINE FINDINGS Alignment: No static subluxation. Facets are aligned. Occipital condyles are normally positioned. Skull base and vertebrae: No acute fracture. Soft tissues and spinal canal: No prevertebral fluid or swelling. No visible canal hematoma. Disc levels: No advanced spinal canal or neural foraminal stenosis. Upper chest: No pneumothorax, pulmonary nodule or pleural effusion. Other: Normal visualized paraspinal cervical soft tissues. IMPRESSION: 1. No acute intracranial abnormality. 2. No acute fracture or static subluxation of the cervical spine. Electronically Signed   By: Deatra Robinson M.D.   On: 10/30/2020 01:30    Procedures Procedures   Medications Ordered in ED Medications - No data to display  ED Course  I have reviewed the triage vital signs and the nursing notes.  Pertinent labs & imaging results that were available during my care of the patient were reviewed by me and considered in my medical decision making (see chart for details).    MDM Rules/Calculators/A&P                          Patient with right-sided facial and headache with ear pain.  Symptoms have been ongoing for some time.  She reports that her doctor has scheduled her for an outpatient CT.  CT has been performed tonight and there is no acute intracranial abnormality.  Ear examination is relatively unremarkable.  She has some chronic scarring of the tympanic membrane but no erythema or bulging.  External canals normal.  She  does have infra-auricular lymphadenopathy.  Neuro exam is normal.  We will treat lymphadenopathy with ear symptoms with antibiotics, follow-up with PCP.  Final Clinical Impression(s) / ED Diagnoses Final diagnoses:  Acute nonintractable headache, unspecified headache type  Ear pain, right    Rx / DC Orders ED Discharge Orders         Ordered    amoxicillin (AMOXIL) 500 MG capsule  2 times daily        10/30/20 0301           Deon Ivey, Canary Brim, MD 10/30/20 0301

## 2020-10-30 NOTE — ED Notes (Signed)
[employer] taxi called for pt; voucher given to pt

## 2020-10-30 NOTE — ED Triage Notes (Signed)
Patient arrived with EMS from home with multiple complaints : " sweating " all over ; headache , right ear pain , malodorous urine , and upper abdominal pain onset this week .

## 2020-10-30 NOTE — ED Provider Notes (Signed)
Emergency Medicine Provider Triage Evaluation Note  Rose [name] , a 65 y.o. female  was evaluated in triage.  Pt complains of feeling terrible.  States she is having bad headaches and has fallen multiple times.  Doctor recently changed some of her night time medications which has been making her drowsy so she stopped taking it.  Is not currently on anticoagulation.  Also right ear pain and sweating on her right side for a year.  Review of Systems  Positive: Headache, falls Negative: Vomiting  Physical Exam  BP 133/78 (BP Location: Left Arm)   Pulse 98   Temp 98.1 F (36.7 C) (Oral)   Resp 18   Ht 5\' 4"  (1.626 m)   Wt 85.3 kg   SpO2 96%   BMI 32.27 kg/m    Gen:   Awake, alert Resp:  Normal effort MSK:   Moves extremities without difficulty Other:  Holding right side of head  Medical Decision Making  Medically screening exam initiated at 12:01 AM.  Appropriate orders placed.  Krystn Hoose was informed that the remainder of the evaluation will be completed by another provider, this initial triage assessment does not replace that evaluation, and the importance of remaining in the ED until their evaluation is complete.  Labs, EKG, and CT head/cervical spine ordered given headaches and frequent falls.   Victorino Dike, PA-C 10/30/20 0006    12/30/20, MD 10/30/20 [phone]

## 2020-12-23 ENCOUNTER — Encounter: Payer: Self-pay | Admitting: Pulmonary Disease

## 2021-01-13 ENCOUNTER — Emergency Department (HOSPITAL_COMMUNITY)
Admission: EM | Admit: 2021-01-13 | Discharge: 2021-01-14 | Disposition: A | Discharge status: Home / Self Care | Payer: Medicare HMO | Attending: Emergency Medicine | Admitting: Emergency Medicine

## 2021-01-13 ENCOUNTER — Other Ambulatory Visit: Payer: Self-pay

## 2021-01-13 ENCOUNTER — Emergency Department (HOSPITAL_COMMUNITY): Payer: Medicare HMO

## 2021-01-13 DIAGNOSIS — E119 Type 2 diabetes mellitus without complications: Secondary | ICD-10-CM | POA: Diagnosis not present

## 2021-01-13 DIAGNOSIS — F17210 Nicotine dependence, cigarettes, uncomplicated: Secondary | ICD-10-CM | POA: Diagnosis not present

## 2021-01-13 DIAGNOSIS — J45909 Unspecified asthma, uncomplicated: Secondary | ICD-10-CM | POA: Diagnosis not present

## 2021-01-13 DIAGNOSIS — Z7951 Long term (current) use of inhaled steroids: Secondary | ICD-10-CM | POA: Diagnosis not present

## 2021-01-13 DIAGNOSIS — J9811 Atelectasis: Secondary | ICD-10-CM | POA: Diagnosis not present

## 2021-01-13 DIAGNOSIS — Z7984 Long term (current) use of oral hypoglycemic drugs: Secondary | ICD-10-CM | POA: Diagnosis not present

## 2021-01-13 DIAGNOSIS — Z20822 Contact with and (suspected) exposure to covid-19: Secondary | ICD-10-CM | POA: Diagnosis not present

## 2021-01-13 DIAGNOSIS — J441 Chronic obstructive pulmonary disease with (acute) exacerbation: Secondary | ICD-10-CM | POA: Diagnosis not present

## 2021-01-13 DIAGNOSIS — R0602 Shortness of breath: Secondary | ICD-10-CM | POA: Diagnosis present

## 2021-01-13 LAB — RESP PANEL BY RT-PCR (FLU A&B, COVID) ARPGX2
Influenza A by PCR: NEGATIVE
Influenza B by PCR: NEGATIVE
SARS Coronavirus 2 by RT PCR: NEGATIVE

## 2021-01-13 LAB — COMPREHENSIVE METABOLIC PANEL
ALT: 17 U/L (ref 0–44)
AST: 21 U/L (ref 15–41)
Albumin: 3.8 g/dL (ref 3.5–5.0)
Alkaline Phosphatase: 90 U/L (ref 38–126)
Anion gap: 8 (ref 5–15)
BUN: 14 mg/dL (ref 8–23)
CO2: 24 mmol/L (ref 22–32)
Calcium: 8.4 mg/dL — ABNORMAL LOW (ref 8.9–10.3)
Chloride: 106 mmol/L (ref 98–111)
Creatinine, Ser: 0.87 mg/dL (ref 0.44–1.00)
GFR, Estimated: >60 mL/min (ref >60)
Glucose, Bld: 100 mg/dL — ABNORMAL HIGH (ref 70–99)
Potassium: 3.4 mmol/L — ABNORMAL LOW (ref 3.5–5.1)
Sodium: 138 mmol/L (ref 135–145)
Total Bilirubin: 0.5 mg/dL (ref 0.3–1.2)
Total Protein: 6.5 g/dL (ref 6.5–8.1)

## 2021-01-13 LAB — CBC WITH DIFFERENTIAL/PLATELET
Abs Immature Granulocytes: 0.02 10*3/uL (ref 0.00–0.07)
Basophils Absolute: 0 10*3/uL (ref 0.0–0.1)
Basophils Relative: 0 %
Eosinophils Absolute: 0.1 10*3/uL (ref 0.0–0.5)
Eosinophils Relative: 2 %
HCT: 32.1 % — ABNORMAL LOW (ref 36.0–46.0)
Hemoglobin: 10.3 g/dL — ABNORMAL LOW (ref 12.0–15.0)
Immature Granulocytes: 0 %
Lymphocytes Relative: 10 %
Lymphs Abs: 0.6 10*3/uL — ABNORMAL LOW (ref 0.7–4.0)
MCH: 27.4 pg (ref 26.0–34.0)
MCHC: 32.1 g/dL (ref 30.0–36.0)
MCV: 85.4 fL (ref 80.0–100.0)
Monocytes Absolute: 0.3 10*3/uL (ref 0.1–1.0)
Monocytes Relative: 5 %
Neutro Abs: 5.5 10*3/uL (ref 1.7–7.7)
Neutrophils Relative %: 83 %
Platelets: 178 10*3/uL (ref 150–400)
RBC: 3.76 MIL/uL — ABNORMAL LOW (ref 3.87–5.11)
RDW: 15.3 % (ref 11.5–15.5)
WBC: 6.6 10*3/uL (ref 4.0–10.5)
nRBC: 0 % (ref 0.0–0.2)

## 2021-01-13 LAB — BLOOD GAS, VENOUS
Acid-base deficit: 0.4 mmol/L (ref 0.0–2.0)
Bicarbonate: 23.5 mmol/L (ref 20.0–28.0)
O2 Saturation: 88.8 %
Patient temperature: 98.6
pCO2, Ven: 37.9 mmHg — ABNORMAL LOW (ref 44.0–60.0)
pH, Ven: 7.409 (ref 7.250–7.430)
pO2, Ven: 57.4 mmHg — ABNORMAL HIGH (ref 32.0–45.0)

## 2021-01-13 MED ORDER — METHYLPREDNISOLONE SODIUM SUCC 125 MG IJ SOLR
125.0000 mg | Freq: Once | INTRAMUSCULAR | Status: AC
  Administered 2021-01-13: 125 mg via INTRAVENOUS
  Filled 2021-01-13: qty 2

## 2021-01-13 MED ORDER — ALBUTEROL SULFATE HFA 108 (90 BASE) MCG/ACT IN AERS
2.0000 | INHALATION_SPRAY | Freq: Once | RESPIRATORY_TRACT | Status: AC
  Administered 2021-01-13: 2 via RESPIRATORY_TRACT
  Filled 2021-01-13: qty 6.7

## 2021-01-13 MED ORDER — OXYCODONE-ACETAMINOPHEN 5-325 MG PO TABS
1.0000 | ORAL_TABLET | Freq: Once | ORAL | Status: AC
  Administered 2021-01-13: 1 via ORAL
  Filled 2021-01-13: qty 1

## 2021-01-13 MED ORDER — DOXYCYCLINE HYCLATE 100 MG PO TABS
100.0000 mg | ORAL_TABLET | Freq: Once | ORAL | Status: AC
  Administered 2021-01-14: 100 mg via ORAL
  Filled 2021-01-13: qty 1

## 2021-01-13 MED ORDER — HYDROMORPHONE HCL 1 MG/ML IJ SOLN: 1.0000 mg | Freq: Once | INTRAMUSCULAR | Status: DC

## 2021-01-13 NOTE — Discharge Instructions (Addendum)
Dear Mrs Dubey,  Thank you for trusting [place] with your care today.   We evaluated you for shortness of breath. Fortunately, by the time you arrived in the hospital, your oxygen saturation levels had returned to normal levels.   We evaluated your heart and lungs with an EKG and chest xray which fortunately appeared stable. The chest x ray did show some signs consistent with COPD. We will give you a steroid taper course.  Based off of your description of your urogenital symptoms, this is likely just irritation due to stool remaining in contact with your skin. You are not reporting any urinary tract infection symptoms, and likely do not need antibiotics. Please ensure that you change frequently and main proper sanitation.  Please follow up with your primary care physician in 1 week to assess how your shortness of breath is doing. At this time, you may also ask about follow up for your chronic back pain as well.

## 2021-01-13 NOTE — ED Notes (Signed)
Pt ambulated in the hallway. Pt O2 was above 95 the whole time and Pulse was steady at 102. Pt complained of feeling light headed when she got back in the bed.

## 2021-01-13 NOTE — ED Triage Notes (Addendum)
Pt arrived via EMS, from home, c/o SOB. Non productive cough x1 week. Spo2 RA 89%, placed on 2L [place]- 94%. States she has been using inhalers and friends neb tx with minimal improvement.   20G L hand  97% RA in triage

## 2021-01-13 NOTE — ED Provider Notes (Addendum)
[facility]-EMERGENCY DEPT Provider Note   CSN: [id number] Arrival date & time: 01/13/21  2118     History No chief complaint on file.   Rose [name] is a 65 y.o. female. Patient is a 65 year old female with a history of emphysema and tobacco abuse who presents with 2 weeks of intermittent shortness of breath. She reports that she has been having intermittent shortness of breath which has worsened recently. She reports that prior to EMS arrival, she took a klonopin which helped with her SOB. She was using her friends oxygen and albuterol prior to arrival, however, upon evaluation in the ED, she was noted to be satting 98% on RA.  The history is provided by the patient. No language interpreter was used.      Past Medical History:  Diagnosis Date   Allergy    Asthma    Back pain    Chronic headaches    Class 1 obesity due to excess calories with serious comorbidity in adult    COPD (chronic obstructive pulmonary disease) (HCC)    Diabetes (HCC)    Headache    Herniated lumbar intervertebral disc    High cholesterol    History of blood clots    Lumbago with sciatica, unspecified side    Moderate episode of recurrent major depressive disorder (HCC)    Nicotine dependence, cigarettes, with other nicotine-induced disorders    Nonrheumatic aortic (valve) insufficiency    Other chronic pain    Pain in joint involving ankle and foot    Pancreatitis    Presence urogenital implant    Stroke (HCC)    SUI (stress urinary incontinence, female)    Type 2 diabetes mellitus with hyperglycemia, with long-term current use of insulin [facility])     Patient Active Problem List   Diagnosis Date Noted   Anxiety 09/15/2020   Constipation 09/15/2020   DDD (degenerative disc disease), lumbar 09/15/2020   Elevated liver enzymes 09/15/2020   Fatty liver 09/15/2020   Mitral valve problem 09/15/2020   Mobility impaired 09/15/2020   Multiple allergies 09/15/2020   Arthritis  09/15/2020   Risk for falls 09/15/2020   Shortness of breath 09/15/2020   Stroke (HCC) 09/15/2020   Ventral hernia 09/15/2020   Vitiligo 09/15/2020   Acute hypoxemic respiratory failure (HCC) 12/30/2018   Anemia of chronic disease 12/30/2018   Cough 12/30/2018   Homelessness 12/30/2018   Polysubstance abuse (HCC) 12/30/2018   Suicidal ideation 12/30/2018   Severe recurrent major depression without psychotic features (HCC) 12/12/2018   Chronic hoarseness 02/26/2018   Parotid nodule 02/26/2018   Parotid sialolithiasis 02/26/2018   Generalized abdominal pain 05/11/2016   GERD (gastroesophageal reflux disease) 05/11/2016   Hematochezia 05/11/2016   History of colon polyps 05/11/2016   Nausea 05/11/2016   ADD (attention deficit disorder) 03/24/2016   Complaints of memory disturbance 03/24/2016   Depression 03/24/2016   Eczematous dermatitis of upper eyelids of both eyes 01/19/2016   Regular astigmatism of both eyes 01/19/2016   Stress at home 11/16/2015   Deformity of metatarsal bone of left foot 04/16/2015   Pain in lower limb 03/30/2015   Sinus tarsitis of left foot 03/30/2015   Arthritis of ankle, left, degenerative 03/30/2015   COPD, mild 03/24/2015   Cigarette smoker 03/24/2015   Trochanteric bursitis of left hip 07/20/2014   Osteoarthritis of right subtalar joint 05/07/2014   Pain due to any device, implant or graft 05/07/2014   Post-traumatic osteoarthritis of right ankle 05/07/2014  Achilles tendinitis of left lower extremity 05/07/2014   Tobacco abuse 05/07/2014   Nickel allergy 02/07/2014   History of total knee arthroplasty 02/07/2014   Chronic calcific pancreatitis (HCC) 09/29/2013   Pancreatic cyst 09/29/2013   Type 2 diabetes mellitus without complications (HCC) 09/29/2013    Past Surgical History:  Procedure Laterality Date   ANKLE SURGERY     x 3   BREAST REDUCTION SURGERY     CHOLECYSTECTOMY  1998   HERNIA REPAIR     VAGINAL HYSTERECTOMY  1988      OB History   No obstetric history on file.     Family History  Problem Relation Age of Onset   Emphysema Mother    Heart disease Mother    Cancer Mother    Heart disease Father    Cancer Sister     Social History   Tobacco Use   Smoking status: Every Day    Packs/day: 1.00    Years: 47.00    Pack years: 47.00    Types: Cigarettes, E-cigarettes    Last attempt to quit: 04/15/2017    Years since quitting: 3.7   Smokeless tobacco: Never   Tobacco comments:    1/2 - 1 PPD -- Heavy secondhand smoke exposure  Vaping Use   Vaping Use: Former  Substance Use Topics   Alcohol use: No    Alcohol/week: 0.0 standard drinks   Drug use: Yes    Types: Marijuana    Home Medications Prior to Admission medications   Medication Sig Start Date End Date Taking? Authorizing Provider  albuterol (VENTOLIN HFA) 108 (90 Base) MCG/ACT inhaler Inhale 1-2 puffs into the lungs every 6 (six) hours as needed for wheezing or shortness of breath.    [provider]  amoxicillin (AMOXIL) 500 MG capsule Take 2 capsules (1,000 mg total) by mouth 2 (two) times daily. 10/30/20   Pollina, Canary Brim, MD  ANORO ELLIPTA 62.5-25 MCG/INH AEPB Inhale 1 puff into the lungs daily. 11/29/18   [provider]  buPROPion (WELLBUTRIN XL) 150 MG 24 hr tablet Take 1 tablet (150 mg total) by mouth daily. Patient not taking: Reported on 03/25/2019 12/17/18   [name], NP  clonazePAM (KLONOPIN) 1 MG tablet Take 1 mg by mouth 3 (three) times daily as needed for anxiety.     [provider]  clotrimazole-betamethasone (LOTRISONE) cream Apply 1 application topically 2 (two) times daily.    [provider]  CREON 24000-76000 units CPEP Take 1 capsule by mouth 3 (three) times daily. 01/30/19   [provider]  DULoxetine (CYMBALTA) 60 MG capsule Take 1 capsule (60 mg total) by mouth 2 (two) times daily. 12/16/18   [name], NP  estradiol (ESTRACE) 0.5 MG tablet TK 1 T PO  D 01/25/15   [provider]  famotidine (PEPCID) 40 MG tablet Take 40 mg by mouth daily.    [provider]  fluticasone-salmeterol (ADVAIR HFA) 115-21 MCG/ACT inhaler Inhale 2 puffs into the lungs 2 (two) times daily.    [provider]  gabapentin (NEURONTIN) 600 MG tablet Take 600 mg by mouth 2 (two) times daily. 03/05/19   [provider]  HYDROcodone-acetaminophen (NORCO) 10-325 MG tablet Take 1 tablet by mouth every 4 (four) hours as needed for moderate pain or severe pain.  03/13/19   [provider]  hydrOXYzine (ATARAX/VISTARIL) 25 MG tablet Take 1 tablet (25 mg total) by mouth 3 (three) times daily as needed for anxiety.  12/16/18   [name], NP  metFORMIN (GLUCOPHAGE) 500 MG tablet Take 500 mg by mouth 2 (two) times daily. 03/05/19   [provider]  nicotine (NICODERM CQ - DOSED IN MG/24 HOURS) 21 mg/24hr patch Place 21 mg onto the skin daily.    [provider]  omega-3 acid ethyl esters (LOVAZA) 1 G capsule TK 2 CS PO BID 01/25/15   [provider]  ondansetron (ZOFRAN) 8 MG tablet Take by mouth every 8 (eight) hours as needed for nausea or vomiting.    [provider]  PROAIR HFA 108 (90 Base) MCG/ACT inhaler INHALE 2 PUFFS BY MOUTH EVERY 6 HOURS AS NEEDED FOR WHEEZING OR FOR SHORTNESS OF BREATH 03/19/17   Leslye Peer, MD  rosuvastatin (CRESTOR) 20 MG tablet Take 20 mg by mouth daily. 11/08/18   [provider]  sucralfate (CARAFATE) 1 g tablet Take 1 g by mouth 2 (two) times daily.    [provider]  traZODone (DESYREL) 100 MG tablet Take 2 tablets (200 mg total) by mouth at bedtime as needed for sleep. Patient not taking: Reported on 04/17/2019 12/16/18   [name], NP    Allergies    Erythromycin, Nickel, Oxycodone, Meperidine, Codeine, Hydrocodone-acetaminophen, Morphine, Propoxyphene, and Quetiapine  Review of Systems   Review of Systems  Constitutional:  Negative for  fever.  HENT:  Negative for sore throat.   Respiratory:  Positive for shortness of breath.   Cardiovascular:  Negative for palpitations.  Gastrointestinal:  Positive for diarrhea.  Genitourinary:  Negative for frequency and urgency.  Musculoskeletal:  Positive for back pain.  Skin:  Negative for rash.  Neurological:  Negative for weakness.   Physical Exam Updated Vital Signs BP (!) 159/72   Pulse 92   Temp 98.8 F (37.1 C) (Oral)   Resp 19   SpO2 98%   Physical Exam Constitutional:      Appearance: She is well-developed.  HENT:     Head: Normocephalic and atraumatic.  Cardiovascular:     Rate and Rhythm: Normal rate and regular rhythm.  Pulmonary:     Effort: Pulmonary effort is normal.     Breath sounds: Normal breath sounds.  Abdominal:     General: Bowel sounds are normal.     Palpations: Abdomen is soft.  Skin:    General: Skin is warm and dry.  Neurological:     General: No focal deficit present.     Mental Status: She is alert and oriented to person, place, and time.  Psychiatric:        Mood and Affect: Mood normal.        Behavior: Behavior normal.    ED Results / Procedures / Treatments   Labs (all labs ordered are listed, but only abnormal results are displayed) Labs Reviewed - No data to display  EKG None  Radiology No results found.  Procedures Procedures   Medications Ordered in ED Medications - No data to display  ED Course  I have reviewed the triage vital signs and the nursing notes.  Pertinent labs & imaging results that were available during my care of the patient were reviewed by me and considered in my medical decision making (see chart for details).    MDM Rules/Calculators/A&P                          Patient is a 65 year old female with a history of emphysema  and tobacco abuse who presents with 2 weeks of intermittent shortness of breath. She reports that she has been having intermittent shortness of breath which has worsened  recently. She reports that prior to EMS arrival, she took a klonopin which helped with her SOB. She was using her friends oxygen and albuterol prior to arrival, however, upon evaluation in the ED, she was noted to be satting 98% on RA.  SOB evaluated with Blood gases obtained showing pCO2 37.9, pO2 57.4 CBC and CMP non revealing. Respiratory panel negative as well. CXR returned showing  findings consistent with COPD and atelectasis.  Her SOB has resolved and she is not endorsing any new change in sputum color or production. She will be given a steroid taper and discharged home.  Final Clinical Impression(s) / ED Diagnoses Final diagnoses:  None    Rx / DC Orders ED Discharge Orders     None        Adron Bene, MD 01/14/21 Madelin Rear, MD 01/14/21 1716    Charlynne Pander, MD 01/14/21 2325

## 2021-01-14 DIAGNOSIS — J441 Chronic obstructive pulmonary disease with (acute) exacerbation: Secondary | ICD-10-CM | POA: Diagnosis not present

## 2021-01-14 LAB — URINALYSIS, ROUTINE W REFLEX MICROSCOPIC
Bilirubin Urine: NEGATIVE
Glucose, UA: NEGATIVE mg/dL
Ketones, ur: NEGATIVE mg/dL
Leukocytes,Ua: NEGATIVE
Nitrite: NEGATIVE
Protein, ur: NEGATIVE mg/dL
Specific Gravity, Urine: 1.005 (ref 1.005–1.030)
pH: 5 (ref 5.0–8.0)

## 2021-01-14 MED ORDER — PREDNISONE 20 MG PO TABS: ORAL_TABLET | ORAL | 0 refills | Status: AC

## 2021-01-25 ENCOUNTER — Institutional Professional Consult (permissible substitution): Payer: Medicare HMO | Admitting: Pulmonary Disease

## 2021-03-29 ENCOUNTER — Ambulatory Visit (INDEPENDENT_AMBULATORY_CARE_PROVIDER_SITE_OTHER): Payer: Medicare Other | Admitting: Emergency Medicine

## 2021-03-29 ENCOUNTER — Encounter: Payer: Self-pay | Admitting: Emergency Medicine

## 2021-03-29 ENCOUNTER — Other Ambulatory Visit: Payer: Self-pay

## 2021-03-29 DIAGNOSIS — F17210 Nicotine dependence, cigarettes, uncomplicated: Secondary | ICD-10-CM

## 2021-03-29 DIAGNOSIS — J9601 Acute respiratory failure with hypoxia: Secondary | ICD-10-CM | POA: Diagnosis not present

## 2021-03-29 DIAGNOSIS — J449 Chronic obstructive pulmonary disease, unspecified: Secondary | ICD-10-CM

## 2021-03-29 MED ORDER — IPRATROPIUM-ALBUTEROL 0.5-2.5 (3) MG/3ML IN SOLN: 3.0000 mL | Freq: Four times a day (QID) | RESPIRATORY_TRACT | Status: DC | PRN

## 2021-03-29 NOTE — Progress Notes (Signed)
Subjective:    Patient ID: Rose [name], female    DOB: 1955/10/13, 65 y.o.   MRN: [id number]  HPI 65 year old active smoker (47 pack years) with a history of COPD, diabetes, DVT, CVA, mitral regurg.  She is referred back today for evaluation of dyspnea, COPD. She reports progression of resting and exertional dyspnea. She has increased mucous production, daily cough with yellow brown phlegm. She has daily CP - seems to come and go, not necessarily assoc w cough or exertion.  She was in the ED in August with an apparent acute exacerbation of her COPD, treated with corticosteroids.  COVID-19 was negative at that time. She has been on Anoro, Advair in the past but not currently. Sounds like she has been on Breztri before as well. She is unclear whether she got any benefit. She was formerly on O2, but somehow came off of it. She has an old nebulizer, but no meds. Still smoking about 0.5 pk/day.   PFT 05/15/2016 reviewed by me, shows mild obstruction without a bronchodilator response, normal lung volumes, decreased diffusion capacity  LDCT 5//9/22 -- RADS 2, moderate centrilobular emphysema, anteromedial right middle lobe and lingular scarring, multiple stable nodules, largest 5 mm in the right middle lobe.  Review of Systems As per HPI  Past Medical History:  Diagnosis Date   Allergy    Asthma    Back pain    Chronic headaches    Class 1 obesity due to excess calories with serious comorbidity in adult    COPD (chronic obstructive pulmonary disease) (HCC)    Diabetes (HCC)    Headache    Herniated lumbar intervertebral disc    High cholesterol    History of blood clots    Lumbago with sciatica, unspecified side    Moderate episode of recurrent major depressive disorder (HCC)    Nicotine dependence, cigarettes, with other nicotine-induced disorders    Nonrheumatic aortic (valve) insufficiency    Other chronic pain    Pain in joint involving ankle and foot    Pancreatitis     Presence urogenital implant    Stroke (HCC)    SUI (stress urinary incontinence, female)    Type 2 diabetes mellitus with hyperglycemia, with long-term current use of insulin (HCC)      Family History  Problem Relation Age of Onset   Emphysema Mother    Heart disease Mother    Cancer Mother    Heart disease Father    Cancer Sister     Family Hx of lung CA. Parents and sister.   Social History   Socioeconomic History   Marital status: Single    Spouse name: Not on file   Number of children: Not on file   Years of education: Not on file   Highest education level: Not on file  Occupational History   Not on file  Tobacco Use   Smoking status: Every Day    Packs/day: 1.00    Years: 47.00    Pack years: 47.00    Types: Cigarettes, E-cigarettes    Last attempt to quit: 04/15/2017    Years since quitting: 3.9   Smokeless tobacco: Never   Tobacco comments:    0.5 packs smoked daily. ARJ 03/29/21  Vaping Use   Vaping Use: Former  Substance and Sexual Activity   Alcohol use: No    Alcohol/week: 0.0 standard drinks   Drug use: Yes    Types: Marijuana   Sexual activity: Not on file  Other  Topics Concern   Not on file  Social History Narrative   Not on file   Social Determinants of Health   Financial Resource Strain: Not on file  Food Insecurity: Not on file  Transportation Needs: Not on file  Physical Activity: Not on file  Stress: Not on file  Social Connections: Not on file  Intimate Partner Violence: Not on file     Allergies  Allergen Reactions   Erythromycin Shortness Of Breath   Nickel Rash and Shortness Of Breath   Oxycodone Shortness Of Breath   Meperidine Other (See Comments)    Confusion and tingling  (noted in H&P) Confusion and tingling  (noted in H&P)    Codeine Swelling   Hydrocodone-Acetaminophen Itching    Pt reports she can take the 7.5-325 strength.     Morphine Itching   Propoxyphene Other (See Comments)   Quetiapine     Other  reaction(s): Other (See Comments) Restless leg syndrome     Outpatient Medications Prior to Visit  Medication Sig Dispense Refill   albuterol (VENTOLIN HFA) 108 (90 Base) MCG/ACT inhaler Inhale 1-2 puffs into the lungs every 6 (six) hours as needed for wheezing or shortness of breath.     buPROPion (WELLBUTRIN XL) 150 MG 24 hr tablet Take 1 tablet (150 mg total) by mouth daily. 30 tablet 0   clonazePAM (KLONOPIN) 1 MG tablet Take 1 mg by mouth 3 (three) times daily as needed for anxiety.      clotrimazole-betamethasone (LOTRISONE) cream Apply 1 application topically 2 (two) times daily.     DULoxetine (CYMBALTA) 60 MG capsule Take 1 capsule (60 mg total) by mouth 2 (two) times daily. 60 capsule 0   estradiol (ESTRACE) 0.5 MG tablet TK 1 T PO D  1   gabapentin (NEURONTIN) 600 MG tablet Take 600 mg by mouth 2 (two) times daily.     metFORMIN (GLUCOPHAGE) 500 MG tablet Take 500 mg by mouth 2 (two) times daily.     rosuvastatin (CRESTOR) 20 MG tablet Take 20 mg by mouth daily.     sucralfate (CARAFATE) 1 g tablet Take 1 g by mouth 2 (two) times daily.     traZODone (DESYREL) 100 MG tablet Take 2 tablets (200 mg total) by mouth at bedtime as needed for sleep. 60 tablet 0   amoxicillin (AMOXIL) 500 MG capsule Take 2 capsules (1,000 mg total) by mouth 2 (two) times daily. (Patient not taking: Reported on 03/29/2021) 40 capsule 0   ANORO ELLIPTA 62.5-25 MCG/INH AEPB Inhale 1 puff into the lungs daily. (Patient not taking: Reported on 03/29/2021)     CREON 24000-76000 units CPEP Take 1 capsule by mouth 3 (three) times daily. (Patient not taking: Reported on 03/29/2021)     famotidine (PEPCID) 40 MG tablet Take 40 mg by mouth daily. (Patient not taking: Reported on 03/29/2021)     fluticasone-salmeterol (ADVAIR HFA) 115-21 MCG/ACT inhaler Inhale 2 puffs into the lungs 2 (two) times daily. (Patient not taking: Reported on 03/29/2021)     HYDROcodone-acetaminophen (NORCO) 10-325 MG tablet Take 1 tablet by mouth  every 4 (four) hours as needed for moderate pain or severe pain.  (Patient not taking: Reported on 03/29/2021)     hydrOXYzine (ATARAX/VISTARIL) 25 MG tablet Take 1 tablet (25 mg total) by mouth 3 (three) times daily as needed for anxiety. (Patient not taking: Reported on 03/29/2021) 30 tablet 0   nicotine (NICODERM CQ - DOSED IN MG/24 HOURS) 21 mg/24hr patch Place 21 mg  onto the skin daily. (Patient not taking: Reported on 03/29/2021)     omega-3 acid ethyl esters (LOVAZA) 1 G capsule TK 2 CS PO BID (Patient not taking: Reported on 03/29/2021)  6   ondansetron (ZOFRAN) 8 MG tablet Take by mouth every 8 (eight) hours as needed for nausea or vomiting. (Patient not taking: Reported on 03/29/2021)     PROAIR HFA 108 (90 Base) MCG/ACT inhaler INHALE 2 PUFFS BY MOUTH EVERY 6 HOURS AS NEEDED FOR WHEEZING OR FOR SHORTNESS OF BREATH (Patient not taking: Reported on 03/29/2021) 9 each 0   No facility-administered medications prior to visit.          Objective:   Physical Exam  Vitals:   03/29/21 1337  BP: 118/72  Pulse: 82  Temp: 98.6 F (37 C)  TempSrc: Oral  SpO2: 94%  Weight: 190 lb 12.8 oz (86.5 kg)  Height: 5\' 3"  (1.6 m)    Gen: Pleasant, well-nourished, in no distress,  normal affect  ENT: No lesions,  mouth clear,  oropharynx clear, no postnasal drip  Neck: No JVD, no stridor  Lungs: No use of accessory muscles, no crackles or wheezing on normal respiration, no wheeze on forced expiration  Cardiovascular: RRR, heart sounds normal, no murmur or gallops, no peripheral edema  Abdomen: soft and NT, no HSM,  BS normal  Musculoskeletal: No deformities, no cyanosis or clubbing  Neuro: alert, awake, tangential.  Poor memory  Skin: Warm, no lesions or rashes        Assessment & Plan:   COPD, mild Mild obstruction documented in 2017 but certainly symptomatic and with intermittent exacerbations.  Question whether she has a more emphysematous phenotype.  Currently untreated.  She  has not responded well to handheld BD in the past including most recently [place].  Question whether it is being delivered adequately.  I will try her on scheduled DuoNeb to see if she gets benefit.  Acute hypoxemic respiratory failure (HCC) States has been on oxygen before but difficult to recall the circumstances, question whether this was when she was discharged from acute flare.  She does not have oxygen anymore.  We will perform a walking oximetry to ensure no desaturation.  If she does desaturate then we will start supplemental O2.  Cigarette smoker Discussed cessation with her today.  She is smoking 10 cigarettes daily.  She does not believe she is in a position to quit, I encouraged her to try to start cutting down.  She had a lung cancer screening CT that was RADS 2, reassuring and May/2022.  Her next one should be in May/2023.  Jun/2023, MD, PhD 03/29/2021, 2:06 PM [facility] [phone] or if no answer before 7:00PM call [phone] For any issues after 7:00PM please call eLink [phone]

## 2021-03-29 NOTE — Assessment & Plan Note (Signed)
States has been on oxygen before but difficult to recall the circumstances, question whether this was when she was discharged from acute flare.  She does not have oxygen anymore.  We will perform a walking oximetry to ensure no desaturation.  If she does desaturate then we will start supplemental O2.

## 2021-03-29 NOTE — Addendum Note (Signed)
Addended by: Dorisann Frames R on: 03/29/2021 03:21 PM   Modules accepted: Orders

## 2021-03-29 NOTE — Patient Instructions (Addendum)
We will start albuterol/ipratropium nebulizer (DuoNeb) 4 times a day on a schedule. Okay to use albuterol 2 puffs up to every 4 hours if needed for shortness of breath, chest tightness, wheezing. Walking oximetry today on room air You need to continue to work on decreasing your cigarettes.  Ultimate goal will be to stop altogether. We may decide to repeat your pulmonary function testing at some point going forward. You will be due for your repeat lung cancer screening CT scan in May 2023. Follow with APP in 4-6 weeks to assess your improvement on the nebulizers Follow Dr. Delton Coombes in May after your CT scan so we can review the results together.

## 2021-03-29 NOTE — Assessment & Plan Note (Signed)
Mild obstruction documented in 2017 but certainly symptomatic and with intermittent exacerbations.  Question whether she has a more emphysematous phenotype.  Currently untreated.  She has not responded well to handheld BD in the past including most recently [place].  Question whether it is being delivered adequately.  I will try her on scheduled DuoNeb to see if she gets benefit.

## 2021-03-29 NOTE — Assessment & Plan Note (Signed)
Discussed cessation with her today.  She is smoking 10 cigarettes daily.  She does not believe she is in a position to quit, I encouraged her to try to start cutting down.  She had a lung cancer screening CT that was RADS 2, reassuring and May/2022.  Her next one should be in May/2023.

## 2021-04-26 ENCOUNTER — Telehealth: Payer: Self-pay | Admitting: Emergency Medicine

## 2021-04-26 ENCOUNTER — Ambulatory Visit: Payer: Medicare HMO | Admitting: Primary Care

## 2021-04-26 MED ORDER — IPRATROPIUM-ALBUTEROL 0.5-2.5 (3) MG/3ML IN SOLN: 3.0000 mL | Freq: Four times a day (QID) | RESPIRATORY_TRACT | 3 refills | Status: DC | PRN

## 2021-04-26 NOTE — Telephone Encounter (Signed)
It appears medication was sent to preferred pharmacy. Call made to pharmacy, confirmed patient DOB. They did not receive the medication.   Medication sent.   LM informing patient medication has been sent in.   Nothing further needed at this time.

## 2021-05-05 NOTE — Progress Notes (Signed)
Date:  05/06/2021   ID:  Rose [name], DOB Jan 13, 1956, MRN [id number]  PCP:  Merryl Hacker, No  Cardiologist:  Rex Kras, DO, [facility]  (established care 05/06/2021) Former Cardiology Providers: [place] [facility]  Former Cardiothoracic Surgery: Dr. Doree Barthel [facility]).   REASON FOR CONSULT: Aortic atherosclerosis  REQUESTING PHYSICIAN:  Roselee Nova, MD [address] [place],  [place] [address]  Chief Complaint  Patient presents with   Atherosclerosis of aorta   New Patient (Initial Visit)    HPI  Rose [name] is a 65 y.o. Caucasian female who presents to the office with a chief complaint of " established patient, aortic atherosclerosis." Patient's past medical history and cardiovascular risk factors include: Hx of Stroke, Hx of pulmonary embolism (per patient, not sure what year), Mitral regurgitation, History of alcohol dependence, fibromyalgia, aortic atherosclerosis, coronary artery calcification and on gated CT study, obesity due to excess calories, chronic pancreatitis, cigarette smoking, COPD, GERD, marijuana use, obesity due to excess calories, type 2 diabetes mellitus.  She is referred to the office at the request of Roselee Nova, MD for evaluation of aortic atherosclerosis.  Patient states that she used to follow with [facility] and saw cardiologist approximately 4 years ago and was told to have a leaky mitral valve which has not been followed up.  She also complains of chest pain present for the last 1 year, not progressive, last episode yesterday, while in bed playing a game, lasted for a few minutes, and self-limited.  She describes her discomfort as a dull-like sensation, located substernally, nonradiating.  Patient is unable to comment on exertional related symptoms as she is not very active.  When asked why she has not seek medical attention and the symptoms have been going on for 1 year patient states " because I am still  breathing."  She also complains of dyspnea on exertion with house chores.  Denies orthopnea, paroxysmal nocturnal dyspnea lower extremity swelling.  Patient is currently disabled, continues to smoke less than 1 pack/day, and does not participate in any structured exercise program or daily routine.  Briefly reviewed records from [place] which are extensive but patient has been seen by cardiothoracic surgery in the past.  Please see the progress note dated 09/29/2015 when she had seen Dr. 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  Per note, "Dr. Jerelene Redden discussed case with radiology who reported the area of concern surrounding the aorta is pericardial recess. This is external to the aorta. The aorta was resized and is within normal limits. Ms. Seeber does not need any further follow-up from CT surgery. Will discuss with [facility] to continue current care with her other ongoing medical problems. We advised the patient to call with any questions or concerns. "  FUNCTIONAL STATUS: No structured exercise program or daily routine.   ALLERGIES: Allergies  Allergen Reactions   Erythromycin Shortness Of Breath   Nickel Rash and Shortness Of Breath   Oxycodone Shortness Of Breath   Meperidine Other (See Comments)    Confusion and tingling  (noted in H&P) Confusion and tingling  (noted in H&P)    Codeine Swelling   Hydrocodone-Acetaminophen Itching    Pt reports she can take the 7.5-325 strength.     Morphine Itching   Propoxyphene Other (See Comments)   Quetiapine     Other reaction(s): Other (See Comments) Restless leg syndrome    MEDICATION LIST PRIOR TO  VISIT: Current Meds  Medication Sig   albuterol (VENTOLIN HFA) 108 (90 Base) MCG/ACT inhaler Inhale 1-2 puffs into the lungs every 6 (six) hours as needed for wheezing or shortness of breath.   ANORO ELLIPTA 62.5-25 MCG/INH AEPB Inhale 1 puff into the lungs daily.   aspirin EC 81 MG tablet  Take 1 tablet (81 mg total) by mouth daily. Swallow whole.   clonazePAM (KLONOPIN) 1 MG tablet Take 1 mg by mouth 3 (three) times daily as needed for anxiety.    clotrimazole-betamethasone (LOTRISONE) cream Apply 1 application topically 2 (two) times daily.   DULoxetine (CYMBALTA) 60 MG capsule Take 1 capsule (60 mg total) by mouth 2 (two) times daily.   gabapentin (NEURONTIN) 600 MG tablet Take 600 mg by mouth 2 (two) times daily.   metFORMIN (GLUCOPHAGE) 500 MG tablet Take 500 mg by mouth 2 (two) times daily.   omeprazole (PRILOSEC) 40 MG capsule TAKE 1 CAPSULE BY MOUTH 30 MINUTES BEFORE morning meal   PROAIR HFA 108 (90 Base) MCG/ACT inhaler INHALE 2 PUFFS BY MOUTH EVERY 6 HOURS AS NEEDED FOR WHEEZING OR FOR SHORTNESS OF BREATH   sucralfate (CARAFATE) 1 g tablet Take 1 g by mouth 2 (two) times daily.   traZODone (DESYREL) 150 MG tablet Take by mouth.   [DISCONTINUED] rosuvastatin (CRESTOR) 20 MG tablet Take 20 mg by mouth daily.     PAST MEDICAL HISTORY: Past Medical History:  Diagnosis Date   Allergy    Asthma    Back pain    Chronic headaches    Class 1 obesity due to excess calories with serious comorbidity in adult    COPD (chronic obstructive pulmonary disease) (HCC)    Diabetes (HCC)    Headache    Herniated lumbar intervertebral disc    High cholesterol    History of blood clots    Lumbago with sciatica, unspecified side    Moderate episode of recurrent major depressive disorder (HCC)    Nicotine dependence, cigarettes, with other nicotine-induced disorders    Nonrheumatic aortic (valve) insufficiency    Other chronic pain    Pain in joint involving ankle and foot    Pancreatitis    Presence urogenital implant    Stroke (HCC)    SUI (stress urinary incontinence, female)    Type 2 diabetes mellitus with hyperglycemia, with long-term current use of insulin ([place])     PAST SURGICAL HISTORY: Past Surgical History:  Procedure Laterality Date   ANKLE SURGERY     x 3    BREAST REDUCTION SURGERY     CHOLECYSTECTOMY  1998   HERNIA REPAIR     VAGINAL HYSTERECTOMY  1988    FAMILY HISTORY: The patient family history includes Cancer in her mother and sister; Emphysema in her mother; Heart disease in her father and mother.  SOCIAL HISTORY:  The patient  reports that she has been smoking cigarettes and e-cigarettes. She has a 23.50 pack-year smoking history. She has never used smokeless tobacco. She reports that she does not currently use drugs after having used the following drugs: Marijuana. She reports that she does not drink alcohol.  REVIEW OF SYSTEMS: Review of Systems  Constitutional: Negative for chills and fever.  HENT:  Negative for hoarse voice and nosebleeds.   Eyes:  Negative for discharge, double vision and pain.  Cardiovascular:  Positive for chest pain and dyspnea on exertion. Negative for claudication, leg swelling, near-syncope, orthopnea, palpitations, paroxysmal nocturnal dyspnea and syncope.  Respiratory:  Negative for  hemoptysis and shortness of breath.   Musculoskeletal:  Negative for muscle cramps and myalgias.  Gastrointestinal:  Negative for abdominal pain, constipation, diarrhea, hematemesis, hematochezia, melena, nausea and vomiting.  Neurological:  Negative for dizziness and light-headedness.   PHYSICAL EXAM: Vitals with BMI 05/06/2021 03/29/2021 01/14/2021  Height 5' 3"  5' 3"  -  Weight 190 lbs 190 lbs 13 oz -  BMI 41.66 06.30 -  Systolic 160 109 323  Diastolic 72 72 80  Pulse 92 82 85  Some encounter information is confidential and restricted. Go to Review Flowsheets activity to see all data.    CONSTITUTIONAL: Appears older than stated age, poor historian, not well-nourished. No acute distress.  SKIN: Skin is warm and dry. No rash noted. No cyanosis. No pallor. No jaundice HEAD: Normocephalic and atraumatic.  EYES: No scleral icterus MOUTH/THROAT: Moist oral membranes.  NECK: No JVD present. No thyromegaly noted.  Left  carotid bruits  LYMPHATIC: No visible cervical adenopathy.  CHEST Normal respiratory effort. No intercostal retractions  LUNGS: Decreased breath sounds bilaterally.  No stridor. No wheezes. No rales.  CARDIOVASCULAR: Regular rate and rhythm, positive S1-S2, 3 out of 6 holosystolic murmur heard at the apex with radiation to the axilla, no rubs or gallops appreciated. ABDOMINAL: Obese, soft, nontender, nondistended, positive bowel sounds in all 4 quadrants, no apparent ascites.  EXTREMITIES: No peripheral edema, warm to touch, +2 DP and PT pulses HEMATOLOGIC: No significant bruising.  NEUROLOGIC: Oriented to person, place, and time. Nonfocal. Normal muscle tone.  PSYCHIATRIC: Normal mood and affect. Normal behavior. Cooperative  CARDIAC DATABASE: EKG: 05/06/2021: Normal sinus rhythm, 93 bpm, normal axis, LAE, without underlying ischemia or injury pattern.  Echocardiogram: No results found for this or any previous visit from the past 1095 days.   Stress Testing: No results found for this or any previous visit from the past 1095 days.  Heart Catheterization: None  CT chest lung cancer screening: 10/04/2020: 1. Lung-RADS 2, benign appearance or behavior. Continue annual screening with low-dose chest CT without contrast in 12 months. 2. Coronary artery calcifications noted. 3. Changes of chronic pancreatitis.  Aortic Atherosclerosis (ICD10-I70.0) and Emphysema (ICD10-J43.9).  LABORATORY DATA: CBC Latest Ref Rng & Units 01/13/2021 10/30/2020 12/13/2018  WBC 4.0 - 10.5 K/uL 6.6 7.1 5.6  Hemoglobin 12.0 - 15.0 g/dL 10.3(L) 12.6 12.3  Hematocrit 36.0 - 46.0 % 32.1(L) 38.4 38.1  Platelets 150 - 400 K/uL 178 233 215    CMP Latest Ref Rng & Units 01/13/2021 10/30/2020 12/14/2018  Glucose 70 - 99 mg/dL 100(H) 135(H) 92  BUN 8 - 23 mg/dL 14 14 16   Creatinine 0.44 - 1.00 mg/dL 0.87 0.88 0.80  Sodium 135 - 145 mmol/L 138 137 138  Potassium 3.5 - 5.1 mmol/L 3.4(L) 4.1 4.0  Chloride 98 - 111 mmol/L  106 104 104  CO2 22 - 32 mmol/L 24 25 24   Calcium 8.9 - 10.3 mg/dL 8.4(L) 8.7(L) 9.0  Total Protein 6.5 - 8.1 g/dL 6.5 - -  Total Bilirubin 0.3 - 1.2 mg/dL 0.5 - -  Alkaline Phos 38 - 126 U/L 90 - -  AST 15 - 41 U/L 21 - -  ALT 0 - 44 U/L 17 - -    Lipid Panel     Component Value Date/Time   CHOL 158 12/13/2018 0647   TRIG 133 12/13/2018 0647   HDL 51 12/13/2018 0647   CHOLHDL 3.1 12/13/2018 0647   VLDL 27 12/13/2018 0647   LDLCALC 80 12/13/2018 0647    No  components found for: NTPROBNP No results for input(s): PROBNP in the last 8760 hours. No results for input(s): TSH in the last 8760 hours.  BMP Recent Labs    10/30/20 0026 01/13/21 2202  NA 137 138  K 4.1 3.4*  CL 104 106  CO2 25 24  GLUCOSE 135* 100*  BUN 14 14  CREATININE 0.88 0.87  CALCIUM 8.7* 8.4*  GFRNONAA >60 >60    HEMOGLOBIN A1C Lab Results  Component Value Date   HGBA1C 5.4 12/13/2018   MPG 108.28 12/13/2018   External Labs:  Date Collected: 03/14/2021 , information obtained by referring provider Potassium: 4.5 Creatinine 0.97 mg/dL. eGFR: 65 mL/min per 1.73 m Hemoglobin: 11.0 g/dL and hematocrit: 34.1 % Lipid profile: Total cholesterol 154 , triglycerides 139 , HDL 53 , LDL 77 AST: 22 , ALT: 13 , alkaline phosphatase: 100  TSH: 1.30    IMPRESSION:    ICD-10-CM   1. Precordial pain  R07.2 aspirin EC 81 MG tablet    PCV ECHOCARDIOGRAM COMPLETE    PCV MYOCARDIAL PERFUSION WO LEXISCAN    2. Dyspnea on exertion  R06.09 PCV ECHOCARDIOGRAM COMPLETE    PCV MYOCARDIAL PERFUSION WO LEXISCAN    3. Atherosclerosis of aorta (HCC)  I70.0 EKG 12-Lead    aspirin EC 81 MG tablet    PCV ECHOCARDIOGRAM COMPLETE    PCV MYOCARDIAL PERFUSION WO LEXISCAN    rosuvastatin (CRESTOR) 40 MG tablet    4. Coronary artery calcification seen on computed tomography  I25.10 aspirin EC 81 MG tablet    PCV ECHOCARDIOGRAM COMPLETE    PCV MYOCARDIAL PERFUSION WO LEXISCAN    rosuvastatin (CRESTOR) 40 MG tablet     5. Type 2 diabetes mellitus with other circulatory complication, without long-term current use of insulin (HCC)  E11.59 PCV ECHOCARDIOGRAM COMPLETE    PCV MYOCARDIAL PERFUSION WO LEXISCAN    rosuvastatin (CRESTOR) 40 MG tablet    6. Type 2 diabetes mellitus with hyperlipidemia (HCC)  E11.69 rosuvastatin (CRESTOR) 40 MG tablet   E78.5     7. Pulmonary emphysema  J43.9     8. Bruit of left carotid artery  R09.89 PCV CAROTID DUPLEX (BILATERAL)    9. Cigarette smoker  F17.210        RECOMMENDATIONS: Rose [name] is a 65 y.o. Caucasian female whose past medical history and cardiac risk factors include: Hx of Stroke, Hx of pulmonary embolism (per patient, not sure what year), Mitral regurgitation, History of alcohol dependence, fibromyalgia, aortic atherosclerosis, coronary artery calcification and on gated CT study, obesity due to excess calories, chronic pancreatitis, cigarette smoking, COPD, GERD, marijuana use, obesity due to excess calories, type 2 diabetes mellitus.  Precordial pain / Dyspnea on exertion / CAC on non-gated CT study / Atherosclerosis of aorta [facility]): Precordial pain is not classic of cardiac etiology; however, she is also not physically active, to comment on exertional discomfort. Patient has multiple cardiovascular risk factors as stated above and would benefit from an ischemic evaluation. EKG: Sinus rhythm without underlying ischemia or injury pattern Echocardiogram will be ordered to evaluate for structural heart disease and left ventricular systolic function. We will schedule the patient for nuclear stress test given her precordial discomfort, high pretest probability of CAD, and unable to exercise. Start aspirin 81 mg p.o. daily Uptitrate Crestor to 40 mg p.o. nightly -recent lipid profile notes LDL not currently at goal.  New prescription sent to the pharmacy Educated on importance of complete smoking cessation. Educated on importance of improving her  modifiable  cardiovascular risk factors; however, appears to have poor insight regarding her chronic comorbid conditions.  Type 2 diabetes mellitus with other circulatory complication and hyperlipidemia, without long-term current use of insulin (HCC) Currently being managed per primary team. Currently on metformin. Consider initiation of ACE inhibitor's/ARB, Jardiance -defer to primary team. Will uptitrate statin therapy as discussed above Outside labs independently reviewed.  Bruit of left carotid artery Will check carotid duplex. Educated on importance of complete smoking cessation and statin/aspirin therapy.  Cigarette smoker Tobacco cessation counseling: Currently smoking <1 packs/day   Patient was informed of the dangers of tobacco abuse including stroke, cancer, and MI, as well as benefits of tobacco cessation. Patient is not willing to quit at this time. 7 mins were spent counseling patient cessation techniques. We discussed various methods to help quit smoking, including deciding on a date to quit, joining a support group, pharmacological agents- nicotine gum/patch/lozenges.  I will reassess her progress at the next follow-up visit  FINAL MEDICATION LIST END OF ENCOUNTER: Meds ordered this encounter  Medications   aspirin EC 81 MG tablet    Sig: Take 1 tablet (81 mg total) by mouth daily. Swallow whole.    Dispense:  30 tablet    Refill:  11   rosuvastatin (CRESTOR) 40 MG tablet    Sig: Take 1 tablet (40 mg total) by mouth at bedtime.    Dispense:  90 tablet    Refill:  0     Medications Discontinued During This Encounter  Medication Reason   amoxicillin (AMOXIL) 500 MG capsule    buPROPion (WELLBUTRIN XL) 150 MG 24 hr tablet    traZODone (DESYREL) 100 MG tablet    omega-3 acid ethyl esters (LOVAZA) 1 G capsule    nicotine (NICODERM CQ - DOSED IN MG/24 HOURS) 21 mg/24hr patch    CREON 24000-76000 units CPEP    estradiol (ESTRACE) 0.5 MG tablet    famotidine (PEPCID) 40 MG  tablet    fluticasone-salmeterol (ADVAIR HFA) 115-21 MCG/ACT inhaler    HYDROcodone-acetaminophen (NORCO) 10-325 MG tablet    hydrOXYzine (ATARAX/VISTARIL) 25 MG tablet    ipratropium-albuterol (DUONEB) 0.5-2.5 (3) MG/3ML SOLN    ondansetron (ZOFRAN) 8 MG tablet    rosuvastatin (CRESTOR) 20 MG tablet Reorder     Current Outpatient Medications:    albuterol (VENTOLIN HFA) 108 (90 Base) MCG/ACT inhaler, Inhale 1-2 puffs into the lungs every 6 (six) hours as needed for wheezing or shortness of breath., Disp: , Rfl:    ANORO ELLIPTA 62.5-25 MCG/INH AEPB, Inhale 1 puff into the lungs daily., Disp: , Rfl:    aspirin EC 81 MG tablet, Take 1 tablet (81 mg total) by mouth daily. Swallow whole., Disp: 30 tablet, Rfl: 11   clonazePAM (KLONOPIN) 1 MG tablet, Take 1 mg by mouth 3 (three) times daily as needed for anxiety. , Disp: , Rfl:    clotrimazole-betamethasone (LOTRISONE) cream, Apply 1 application topically 2 (two) times daily., Disp: , Rfl:    DULoxetine (CYMBALTA) 60 MG capsule, Take 1 capsule (60 mg total) by mouth 2 (two) times daily., Disp: 60 capsule, Rfl: 0   gabapentin (NEURONTIN) 600 MG tablet, Take 600 mg by mouth 2 (two) times daily., Disp: , Rfl:    metFORMIN (GLUCOPHAGE) 500 MG tablet, Take 500 mg by mouth 2 (two) times daily., Disp: , Rfl:    omeprazole (PRILOSEC) 40 MG capsule, TAKE 1 CAPSULE BY MOUTH 30 MINUTES BEFORE morning meal, Disp: , Rfl:    PROAIR HFA  108 (90 Base) MCG/ACT inhaler, INHALE 2 PUFFS BY MOUTH EVERY 6 HOURS AS NEEDED FOR WHEEZING OR FOR SHORTNESS OF BREATH, Disp: 9 each, Rfl: 0   sucralfate (CARAFATE) 1 g tablet, Take 1 g by mouth 2 (two) times daily., Disp: , Rfl:    traZODone (DESYREL) 150 MG tablet, Take by mouth., Disp: , Rfl:    rosuvastatin (CRESTOR) 40 MG tablet, Take 1 tablet (40 mg total) by mouth at bedtime., Disp: 90 tablet, Rfl: 0  Orders Placed This Encounter  Procedures   PCV MYOCARDIAL PERFUSION WO LEXISCAN   EKG 12-Lead   PCV ECHOCARDIOGRAM  COMPLETE   PCV CAROTID DUPLEX (BILATERAL)    There are no Patient Instructions on file for this visit.   --Continue cardiac medications as reconciled in final medication list. --Return in about 4 weeks (around 06/03/2021) for Follow up, Chest pain, Review test results. Or sooner if needed. --Continue follow-up with your primary care physician regarding the management of your other chronic comorbid conditions.  Patient's questions and concerns were addressed to her satisfaction. She voices understanding of the instructions provided during this encounter.   This note was created using a voice recognition software as a result there may be grammatical errors inadvertently enclosed that do not reflect the nature of this encounter. Every attempt is made to correct such errors.  Rex Kras, [place], [facility]  Pager: [phone] Office: [phone]

## 2021-05-06 ENCOUNTER — Ambulatory Visit: Payer: Medicare Other | Admitting: Cardiology

## 2021-05-06 ENCOUNTER — Encounter: Payer: Self-pay | Admitting: Cardiology

## 2021-05-06 ENCOUNTER — Other Ambulatory Visit: Payer: Self-pay

## 2021-05-06 VITALS — BP 134/72 | HR 92 | Resp 16 | Ht 63.0 in | Wt 190.0 lb

## 2021-05-06 DIAGNOSIS — E1159 Type 2 diabetes mellitus with other circulatory complications: Secondary | ICD-10-CM

## 2021-05-06 DIAGNOSIS — I700 Atherosclerosis of aorta: Secondary | ICD-10-CM

## 2021-05-06 DIAGNOSIS — E785 Hyperlipidemia, unspecified: Secondary | ICD-10-CM

## 2021-05-06 DIAGNOSIS — R072 Precordial pain: Secondary | ICD-10-CM

## 2021-05-06 DIAGNOSIS — J439 Emphysema, unspecified: Secondary | ICD-10-CM

## 2021-05-06 DIAGNOSIS — I2510 Atherosclerotic heart disease of native coronary artery without angina pectoris: Secondary | ICD-10-CM

## 2021-05-06 DIAGNOSIS — E1169 Type 2 diabetes mellitus with other specified complication: Secondary | ICD-10-CM

## 2021-05-06 DIAGNOSIS — R0609 Other forms of dyspnea: Secondary | ICD-10-CM

## 2021-05-06 DIAGNOSIS — R0989 Other specified symptoms and signs involving the circulatory and respiratory systems: Secondary | ICD-10-CM

## 2021-05-06 DIAGNOSIS — F17210 Nicotine dependence, cigarettes, uncomplicated: Secondary | ICD-10-CM

## 2021-05-06 MED ORDER — ROSUVASTATIN CALCIUM 40 MG PO TABS: 40.0000 mg | ORAL_TABLET | Freq: Every day | ORAL | 0 refills | Status: DC

## 2021-05-06 MED ORDER — ASPIRIN EC 81 MG PO TBEC
81.0000 mg | DELAYED_RELEASE_TABLET | Freq: Every day | ORAL | 11 refills | Status: DC
Start: 2021-05-06 — End: 2023-06-22

## 2021-05-10 ENCOUNTER — Ambulatory Visit: Payer: Medicaid Other | Admitting: Primary Care

## 2021-05-10 NOTE — Progress Notes (Deleted)
@Patient  ID: , female    DOB: 1956/01/28, 65 y.o.   MRN: [id number]  No chief complaint on file.   Referring provider: No ref. provider found  HPI: 65 year old female, current everyday smoker. PMH significant for mild COPD, acute hypoxemic respiratory failure, cigarette smoker. Patient of Dr. 77, last seen on 03/29/21. She had mild obstruction on spirometry in 2017 but she is symptomatic with intermittent exacerbations. Currently untreated, she has not responded well to handheld BD in the past most recently with [facility]. Started on scheduled Duonebs to see if he gets benefit. Due for repeat lung cancer screening in May 2023.   05/10/2021 Patient presents today for 4-6 week follow-up.        Allergies  Allergen Reactions   Erythromycin Shortness Of Breath   Nickel Rash and Shortness Of Breath   Oxycodone Shortness Of Breath   Meperidine Other (See Comments)    Confusion and tingling  (noted in H&P) Confusion and tingling  (noted in H&P)    Codeine Swelling   Hydrocodone-Acetaminophen Itching    Pt reports she can take the 7.5-325 strength.     Morphine Itching   Propoxyphene Other (See Comments)   Quetiapine     Other reaction(s): Other (See Comments) Restless leg syndrome    Immunization History  Administered Date(s) Administered   Influenza Split 03/10/2015   Influenza,inj,Quad PF,6+ Mos 02/04/2019   PFIZER(Purple Top)SARS-COV-2 Vaccination 09/04/2019, 10/01/2019   Pneumococcal Conjugate-13 03/23/2014   Tdap 01/27/2018   Zoster Recombinat (Shingrix) 11/09/2018    Past Medical History:  Diagnosis Date   Allergy    Asthma    Back pain    Chronic headaches    Class 1 obesity due to excess calories with serious comorbidity in adult    COPD (chronic obstructive pulmonary disease) (HCC)    Diabetes (HCC)    Headache    Herniated lumbar intervertebral disc    High cholesterol    History of blood clots    Lumbago with sciatica, unspecified side     Moderate episode of recurrent major depressive disorder (HCC)    Nicotine dependence, cigarettes, with other nicotine-induced disorders    Nonrheumatic aortic (valve) insufficiency    Other chronic pain    Pain in joint involving ankle and foot    Pancreatitis    Presence urogenital implant    Stroke (HCC)    SUI (stress urinary incontinence, female)    Type 2 diabetes mellitus with hyperglycemia, with long-term current use of insulin (HCC)     Tobacco History: Social History   Tobacco Use  Smoking Status Every Day   Packs/day: 0.50   Years: 47.00   Pack years: 23.50   Types: Cigarettes, E-cigarettes   Last attempt to quit: 04/15/2017   Years since quitting: 4.0  Smokeless Tobacco Never  Tobacco Comments   0.5 packs smoked daily. ARJ 03/29/21   Ready to quit: Not Answered Counseling given: Not Answered Tobacco comments: 0.5 packs smoked daily. ARJ 03/29/21   Outpatient Medications Prior to Visit  Medication Sig Dispense Refill   albuterol (VENTOLIN HFA) 108 (90 Base) MCG/ACT inhaler Inhale 1-2 puffs into the lungs every 6 (six) hours as needed for wheezing or shortness of breath.     ANORO ELLIPTA 62.5-25 MCG/INH AEPB Inhale 1 puff into the lungs daily.     aspirin EC 81 MG tablet Take 1 tablet (81 mg total) by mouth daily. Swallow whole. 30 tablet 11   clonazePAM (KLONOPIN) 1 MG  tablet Take 1 mg by mouth 3 (three) times daily as needed for anxiety.      clotrimazole-betamethasone (LOTRISONE) cream Apply 1 application topically 2 (two) times daily.     DULoxetine (CYMBALTA) 60 MG capsule Take 1 capsule (60 mg total) by mouth 2 (two) times daily. 60 capsule 0   gabapentin (NEURONTIN) 600 MG tablet Take 600 mg by mouth 2 (two) times daily.     metFORMIN (GLUCOPHAGE) 500 MG tablet Take 500 mg by mouth 2 (two) times daily.     omeprazole (PRILOSEC) 40 MG capsule TAKE 1 CAPSULE BY MOUTH 30 MINUTES BEFORE morning meal     PROAIR HFA 108 (90 Base) MCG/ACT inhaler INHALE 2 PUFFS  BY MOUTH EVERY 6 HOURS AS NEEDED FOR WHEEZING OR FOR SHORTNESS OF BREATH 9 each 0   rosuvastatin (CRESTOR) 40 MG tablet Take 1 tablet (40 mg total) by mouth at bedtime. 90 tablet 0   sucralfate (CARAFATE) 1 g tablet Take 1 g by mouth 2 (two) times daily.     traZODone (DESYREL) 150 MG tablet Take by mouth.     No facility-administered medications prior to visit.      Review of Systems  Review of Systems   Physical Exam  There were no vitals taken for this visit. Physical Exam   Lab Results:  CBC    Component Value Date/Time   WBC 6.6 01/13/2021 2202   RBC 3.76 (L) 01/13/2021 2202   HGB 10.3 (L) 01/13/2021 2202   HCT 32.1 (L) 01/13/2021 2202   PLT 178 01/13/2021 2202   MCV 85.4 01/13/2021 2202   MCH 27.4 01/13/2021 2202   MCHC 32.1 01/13/2021 2202   RDW 15.3 01/13/2021 2202   LYMPHSABS 0.6 (L) 01/13/2021 2202   MONOABS 0.3 01/13/2021 2202   EOSABS 0.1 01/13/2021 2202   BASOSABS 0.0 01/13/2021 2202    BMET    Component Value Date/Time   NA 138 01/13/2021 2202   K 3.4 (L) 01/13/2021 2202   CL 106 01/13/2021 2202   CO2 24 01/13/2021 2202   GLUCOSE 100 (H) 01/13/2021 2202   BUN 14 01/13/2021 2202   CREATININE 0.87 01/13/2021 2202   CALCIUM 8.4 (L) 01/13/2021 2202   GFRNONAA >60 01/13/2021 2202   GFRAA >60 12/14/2018 0647    BNP No results found for: BNP  ProBNP No results found for: PROBNP  Imaging: No results found.   Assessment & Plan:   No problem-specific Assessment & Plan notes found for this encounter.     Glenford Bayley, NP 05/10/2021

## 2021-05-10 NOTE — Progress Notes (Signed)
ERRONeous encounter

## 2021-05-16 ENCOUNTER — Telehealth: Payer: Self-pay | Admitting: Cardiology

## 2021-05-18 ENCOUNTER — Ambulatory Visit: Payer: Medicare Other

## 2021-05-18 ENCOUNTER — Ambulatory Visit: Payer: Self-pay | Admitting: Cardiology

## 2021-05-18 ENCOUNTER — Other Ambulatory Visit: Payer: Self-pay

## 2021-05-18 VITALS — BP 113/63 | HR 84 | Ht 63.0 in

## 2021-05-18 DIAGNOSIS — E1159 Type 2 diabetes mellitus with other circulatory complications: Secondary | ICD-10-CM

## 2021-05-18 DIAGNOSIS — R0609 Other forms of dyspnea: Secondary | ICD-10-CM

## 2021-05-18 DIAGNOSIS — R072 Precordial pain: Secondary | ICD-10-CM

## 2021-05-18 DIAGNOSIS — I2510 Atherosclerotic heart disease of native coronary artery without angina pectoris: Secondary | ICD-10-CM

## 2021-05-18 DIAGNOSIS — R0989 Other specified symptoms and signs involving the circulatory and respiratory systems: Secondary | ICD-10-CM

## 2021-05-18 DIAGNOSIS — I700 Atherosclerosis of aorta: Secondary | ICD-10-CM

## 2021-05-24 ENCOUNTER — Telehealth: Payer: Self-pay

## 2021-05-24 NOTE — Telephone Encounter (Signed)
Pt called requesting echocardiogram results. Please advise.

## 2021-05-26 NOTE — Telephone Encounter (Signed)
Please let her know that she has valvular heart disease that needs to be explained to her during the office visit.  She should keep there appt on Jan 16th 2023 or if available please have her come in sooner to discuss them.  ST

## 2021-05-27 NOTE — Telephone Encounter (Signed)
Called and spoke to pt, pt voiced understanding.

## 2021-05-31 NOTE — Progress Notes (Signed)
Called and spoke to pt, pt voiced understanding.

## 2021-05-31 NOTE — Progress Notes (Signed)
Called and spoke to pt, pt voiced understanding. Attempted to call daughter as well, no answer. Left vm requesting for pts daughter to call us back.

## 2021-05-31 NOTE — Progress Notes (Signed)
Patient  daughter called back, she changed the appointment date back because the 16th was the day that she starts her new job. Appointment moved to the 12th of January.

## 2021-06-02 ENCOUNTER — Encounter (HOSPITAL_BASED_OUTPATIENT_CLINIC_OR_DEPARTMENT_OTHER): Payer: Self-pay | Admitting: Obstetrics and Gynecology

## 2021-06-02 ENCOUNTER — Emergency Department (HOSPITAL_BASED_OUTPATIENT_CLINIC_OR_DEPARTMENT_OTHER): Payer: 59 | Admitting: Radiology

## 2021-06-02 ENCOUNTER — Other Ambulatory Visit: Payer: Self-pay

## 2021-06-02 ENCOUNTER — Telehealth: Payer: Self-pay | Admitting: Cardiology

## 2021-06-02 ENCOUNTER — Emergency Department (HOSPITAL_BASED_OUTPATIENT_CLINIC_OR_DEPARTMENT_OTHER): Payer: 59

## 2021-06-02 ENCOUNTER — Emergency Department (HOSPITAL_BASED_OUTPATIENT_CLINIC_OR_DEPARTMENT_OTHER)
Admission: EM | Admit: 2021-06-02 | Discharge: 2021-06-02 | Disposition: A | Discharge status: Home / Self Care | Payer: 59 | Attending: Emergency Medicine | Admitting: Emergency Medicine

## 2021-06-02 ENCOUNTER — Telehealth: Payer: Self-pay

## 2021-06-02 DIAGNOSIS — Z7982 Long term (current) use of aspirin: Secondary | ICD-10-CM | POA: Diagnosis not present

## 2021-06-02 DIAGNOSIS — F17290 Nicotine dependence, other tobacco product, uncomplicated: Secondary | ICD-10-CM | POA: Diagnosis not present

## 2021-06-02 DIAGNOSIS — R0789 Other chest pain: Secondary | ICD-10-CM | POA: Insufficient documentation

## 2021-06-02 DIAGNOSIS — R0600 Dyspnea, unspecified: Secondary | ICD-10-CM | POA: Diagnosis not present

## 2021-06-02 DIAGNOSIS — Z7984 Long term (current) use of oral hypoglycemic drugs: Secondary | ICD-10-CM | POA: Diagnosis not present

## 2021-06-02 DIAGNOSIS — R0609 Other forms of dyspnea: Secondary | ICD-10-CM

## 2021-06-02 DIAGNOSIS — Z79899 Other long term (current) drug therapy: Secondary | ICD-10-CM | POA: Diagnosis not present

## 2021-06-02 DIAGNOSIS — E119 Type 2 diabetes mellitus without complications: Secondary | ICD-10-CM | POA: Insufficient documentation

## 2021-06-02 LAB — BASIC METABOLIC PANEL
Anion gap: 9 (ref 5–15)
BUN: 17 mg/dL (ref 8–23)
CO2: 23 mmol/L (ref 22–32)
Calcium: 8.5 mg/dL — ABNORMAL LOW (ref 8.9–10.3)
Chloride: 105 mmol/L (ref 98–111)
Creatinine, Ser: 0.97 mg/dL (ref 0.44–1.00)
GFR, Estimated: >60 mL/min (ref >60)
Glucose, Bld: 134 mg/dL — ABNORMAL HIGH (ref 70–99)
Potassium: 4.1 mmol/L (ref 3.5–5.1)
Sodium: 137 mmol/L (ref 135–145)

## 2021-06-02 LAB — TROPONIN I (HIGH SENSITIVITY)
Troponin I (High Sensitivity): 4 ng/L (ref <18)
Troponin I (High Sensitivity): 4 ng/L (ref <18)

## 2021-06-02 LAB — CBC
HCT: 31.9 % — ABNORMAL LOW (ref 36.0–46.0)
Hemoglobin: 10.2 g/dL — ABNORMAL LOW (ref 12.0–15.0)
MCH: 25 pg — ABNORMAL LOW (ref 26.0–34.0)
MCHC: 32 g/dL (ref 30.0–36.0)
MCV: 78.2 fL — ABNORMAL LOW (ref 80.0–100.0)
Platelets: 198 10*3/uL (ref 150–400)
RBC: 4.08 MIL/uL (ref 3.87–5.11)
RDW: 17.2 % — ABNORMAL HIGH (ref 11.5–15.5)
WBC: 4.6 10*3/uL (ref 4.0–10.5)
nRBC: 0 % (ref 0.0–0.2)

## 2021-06-02 LAB — BRAIN NATRIURETIC PEPTIDE: B Natriuretic Peptide: 131.9 pg/mL — ABNORMAL HIGH (ref 0.0–100.0)

## 2021-06-02 MED ORDER — IOHEXOL 350 MG/ML SOLN
85.0000 mL | Freq: Once | INTRAVENOUS | Status: AC | PRN
  Administered 2021-06-02: 85 mL via INTRAVENOUS

## 2021-06-02 NOTE — Telephone Encounter (Signed)
Called the patient back after clinic as promised to discussed her test results.  Left a voicemail to call us back.   Tessa Lerner, [place], [facility]  Pager: [phone] Office: [phone]

## 2021-06-02 NOTE — Discharge Instructions (Signed)
Most likely all your symptoms are related to the bad mitral valve.  Keep your appointment on January 12 with cardiology.  No acute findings or any significantly dangerous findings here tonight.  Stable for discharge home.  Return for any new or worse symptoms.

## 2021-06-02 NOTE — ED Notes (Signed)
Called Lab to add BNP for patient.

## 2021-06-02 NOTE — ED Provider Notes (Signed)
[facility] EMERGENCY DEPT Provider Note   CSN: [id number] Arrival date & time: 06/02/21  1238     History  Chief Complaint  Patient presents with   Chest Pain    Rose [name] is a 66 y.o. female.  Patient followed by [facility] heart and vascular.  That her cardiologist is Dr. Elayne Snare, and according to their notes patient known to have a bad mitral valve.  Patient presenting with a complaint of dyspnea on exertion has been going on for months.  And left substernal chest pain that is been going on for months.  Patient had a stress test in December by them without any significant findings.  Echocardiogram showed severe mitral stenosis with moderate to severe mitral regurgitation.  She does not seem to have any new or acute findings.  She has follow-up up with cardiology already arranged for January 12.  In addition there is been a history of a thoracic aortic aneurysm however on repeat evaluation that apparently was not accurate.  Past medical history significant for diabetes high cholesterol chronic headaches back pain chronic pancreatitis COPD obesity, patient still smokes.  Uses e-cigarettes.  But no longer smoking actual cigarettes in 2018.  Surgical wise patient is had a cholecystectomy vaginal hysterectomy breast reduction surgery and hernia repair and ankle surgery.        Home Medications Prior to Admission medications   Medication Sig Start Date End Date Taking? Authorizing Provider  aspirin EC 81 MG tablet Take 1 tablet (81 mg total) by mouth daily. Swallow whole. 05/06/21  Yes Tolia, Sunit, DO  clonazePAM (KLONOPIN) 1 MG tablet Take 1 mg by mouth 2 (two) times daily.   Yes [provider]  clotrimazole-betamethasone (LOTRISONE) cream Apply 1 application topically 2 (two) times daily.   Yes [provider]  gabapentin (NEURONTIN) 600 MG tablet Take 600 mg by mouth 3 (three) times daily. 03/05/19  Yes [provider]  metFORMIN (GLUCOPHAGE)  500 MG tablet Take 500 mg by mouth 2 (two) times daily. 03/05/19  Yes [provider]  Multiple Vitamins-Minerals (CENTRUM ADULTS PO) Take 1 tablet by mouth daily.   Yes [provider]  Omega-3 Fatty Acids (FISH OIL PO) Take 1,200 mg by mouth daily.   Yes [provider]  omeprazole (PRILOSEC) 40 MG capsule TAKE 1 CAPSULE BY MOUTH 30 MINUTES BEFORE morning meal   Yes [provider]  rosuvastatin (CRESTOR) 40 MG tablet Take 1 tablet (40 mg total) by mouth at bedtime. 05/06/21 08/04/21 Yes Tolia, Sunit, DO  sucralfate (CARAFATE) 1 g tablet Take 1 g by mouth 2 (two) times daily.   Yes [provider]  traZODone (DESYREL) 150 MG tablet Take 450 mg by mouth at bedtime. 02/28/21  Yes [provider]  DULoxetine (CYMBALTA) 60 MG capsule Take 1 capsule (60 mg total) by mouth 2 (two) times daily. Patient not taking: Reported on 06/02/2021 12/16/18   [name], NP  PROAIR HFA 108 [phone] Base) MCG/ACT inhaler INHALE 2 PUFFS BY MOUTH EVERY 6 HOURS AS NEEDED FOR WHEEZING OR FOR SHORTNESS OF BREATH Patient not taking: Reported on 06/02/2021 03/19/17   Collene Gobble, MD      Allergies    Erythromycin, Nickel, Oxycodone, Meperidine, Codeine, Hydrocodone-acetaminophen, Morphine, Propoxyphene, and Quetiapine    Review of Systems   Review of Systems  Constitutional:  Negative for chills and fever.  HENT:  Negative for ear pain and sore throat.   Eyes:  Negative for pain and visual disturbance.  Respiratory:  Positive for shortness of breath. Negative for cough.   Cardiovascular:  Positive for chest pain. Negative for palpitations.  Gastrointestinal:  Negative for abdominal pain and vomiting.  Genitourinary:  Negative for dysuria and hematuria.  Musculoskeletal:  Negative for arthralgias and back pain.  Skin:  Negative for color change and rash.  Neurological:  Negative for seizures and syncope.  All other systems reviewed and are negative.  Physical  Exam Updated Vital Signs BP (!) 150/76 (BP Location: Right Arm)    Pulse 83    Temp 97.7 F (36.5 C)    Resp 16    SpO2 96%  Physical Exam Vitals and nursing note reviewed.  Constitutional:      General: She is not in acute distress.    Appearance: Normal appearance. She is well-developed.  HENT:     Head: Normocephalic and atraumatic.  Eyes:     Conjunctiva/sclera: Conjunctivae normal.     Pupils: Pupils are equal, round, and reactive to light.  Cardiovascular:     Rate and Rhythm: Normal rate and regular rhythm.     Heart sounds: No murmur heard. Pulmonary:     Effort: Pulmonary effort is normal. No respiratory distress.     Breath sounds: Normal breath sounds. No wheezing, rhonchi or rales.  Abdominal:     Palpations: Abdomen is soft.     Tenderness: There is no abdominal tenderness.  Musculoskeletal:        General: No swelling.     Cervical back: Normal range of motion and neck supple.     Right lower leg: No edema.     Left lower leg: No edema.  Skin:    General: Skin is warm and dry.     Capillary Refill: Capillary refill takes less than 2 seconds.  Neurological:     General: No focal deficit present.     Mental Status: She is alert and oriented to person, place, and time.  Psychiatric:        Mood and Affect: Mood normal.    ED Results / Procedures / Treatments   Labs (all labs ordered are listed, but only abnormal results are displayed) Labs Reviewed  BASIC METABOLIC PANEL - Abnormal; Notable for the following components:      Result Value   Glucose, Bld 134 (*)    Calcium 8.5 (*)    All other components within normal limits  CBC - Abnormal; Notable for the following components:   Hemoglobin 10.2 (*)    HCT 31.9 (*)    MCV 78.2 (*)    MCH 25.0 (*)    RDW 17.2 (*)    All other components within normal limits  BRAIN NATRIURETIC PEPTIDE - Abnormal; Notable for the following components:   B Natriuretic Peptide 131.9 (*)    All other components within  normal limits  TROPONIN I (HIGH SENSITIVITY)  TROPONIN I (HIGH SENSITIVITY)    EKG EKG Interpretation  Date/Time:  Thursday June 02 2021 12:52:54 EST Ventricular Rate:  81 PR Interval:  174 QRS Duration: 72 QT Interval:  380 QTC Calculation: 441 R Axis:   52 Text Interpretation: Normal sinus rhythm Normal ECG When compared with ECG of 13-Jan-2021 21:48, PREVIOUS ECG IS PRESENT Confirmed by Fredia Sorrow [phone]) on 06/02/2021 3:30:23 PM  Radiology DG Chest 2 View  Result Date: 06/02/2021 CLINICAL DATA:  Chest pain. EXAM: CHEST - 2 VIEW COMPARISON:  January 13, 2021. FINDINGS: The heart size and mediastinal contours are within normal limits.  Both lungs are clear. The visualized skeletal structures are unremarkable. IMPRESSION: No active cardiopulmonary disease. Electronically Signed   By: Marijo Conception M.D.   On: 06/02/2021 13:13   CT Angio Chest PE W/Cm &/Or Wo Cm  Result Date: 06/02/2021 CLINICAL DATA:  Pulmonary embolism (PE) suspected, high prob. Reports to the ER for chest pain. Patient reports she has a "leaky heart valve, an aortic aneurysm and a bad heart" EXAM: CT ANGIOGRAPHY CHEST WITH CONTRAST TECHNIQUE: Multidetector CT imaging of the chest was performed using the standard protocol during bolus administration of intravenous contrast. Multiplanar CT image reconstructions and MIPs were obtained to evaluate the vascular anatomy. CONTRAST:  36mL OMNIPAQUE IOHEXOL 350 MG/ML SOLN COMPARISON:  CT chest 12/04/2020, CT angiography 12/13/2018 FINDINGS: Cardiovascular: Satisfactory opacification of the pulmonary arteries to the segmental level. No evidence of pulmonary embolism. The main pulmonary artery is normal in caliber. The main pulmonary artery is normal in caliber. Enlarged left atrium. No significant pericardial effusion. The thoracic aorta is normal in caliber. Mild atherosclerotic plaque of the thoracic aorta. No coronary artery calcifications. Mediastinum/Nodes: Prominent but  nonenlarged mediastinal lymph nodes. No enlarged mediastinal, hilar, or axillary lymph nodes. Thyroid gland, trachea, and esophagus demonstrate no significant findings. Lungs/Pleura: Centrilobular emphysematous changes. Similar-appearing right lower lobe scarring with associated punctate calcified subpleural nodule (7:107). No focal consolidation. No pulmonary nodule. No pulmonary mass. No pleural effusion. No pneumothorax. Mild bronchial wall thickening. Upper Abdomen: Findings suggestive of chronic pancreatitis. Status post cholecystectomy. Musculoskeletal: Mass. No suspicious lytic or blastic osseous lesions. No acute displaced fracture. Review of the MIP images confirms the above findings. IMPRESSION: 1. No pulmonary embolus. 2. No acute intrathoracic abnormality. 3. Aortic Atherosclerosis (ICD10-I70.0) and Emphysema (ICD10-J43.9). 4. Enlarged left atrium. 5. Chronic pancreatitis. Electronically Signed   By: Iven Finn M.D.   On: 06/02/2021 17:12    Procedures Procedures  Patient with persistent cardiac monitoring here no significant arrhythmias  Medications Ordered in ED Medications  iohexol (OMNIPAQUE) 350 MG/ML injection 85 mL (85 mLs Intravenous Contrast Given 06/02/21 1605)    ED Course/ Medical Decision Making/ A&P                           Medical Decision Making Patient's work-up for the chest pain troponins normal at 4x2.  Basic metabolic panel without any significant abnormalities.  And without any abdominal pain so complete metabolic panel not done.  White blood cell count 4.6 hemoglobin 10.2.  Patient's BNP elevated a little bit at 131.9.  With her history of fairly significant mitral valve problems this may be explained by that.  CT angio of the chest showed no pulmonary embolism no acute inner thoracic abnormality.  Enlarged left a atrium and chronic pancreatitis.  Patient stable for discharge home and follow-up with cardiology.  Final Clinical Impression(s) / ED  Diagnoses Final diagnoses:  Atypical chest pain  DOE (dyspnea on exertion)    Rx / DC Orders ED Discharge Orders     None         Fredia Sorrow, MD 06/02/21 1906

## 2021-06-02 NOTE — Telephone Encounter (Signed)
Patient called stating she was having chest pain and requested test results. Advised patient to come into the office. Pt refused. We then advised her to go to the ER. Pts daughter called back and stated that she is not going to the ER and is going somewhere else. Pt then hung up. No other attempt has been made to contact us back at this time.

## 2021-06-02 NOTE — ED Triage Notes (Signed)
Patient reports to the ER for chest pain. Patient reports she has a "leaky heart valve, an aortic aneurysm and a bad heart"

## 2021-06-03 ENCOUNTER — Telehealth: Payer: Self-pay

## 2021-06-03 ENCOUNTER — Telehealth: Payer: Self-pay | Admitting: Cardiology

## 2021-06-03 NOTE — Telephone Encounter (Signed)
Call the patient during lunch to discuss her test results.  Unable to reach her and therefore left a voicemail.    I will try to reach her at the end of the day as well.  Rex Kras, [place], [facility]  Pager: [phone] Office: [phone]

## 2021-06-03 NOTE — Telephone Encounter (Signed)
Left message for pt to call me back regarding her calls to the office.  Dr. Odis Hollingsheadolia is aware pt has called.  Dr. Odis Hollingsheadolia did call pt yesterday evening and left a message.

## 2021-06-03 NOTE — Telephone Encounter (Signed)
I also called her during lunch today at 1240pm and left a voicemail.  I tried calling her again after clinic at 541pm and reach her voicemail again. This time did not leave a message.  Number I called was [phone].  Dr. Odis Hollingshead

## 2021-06-03 NOTE — Telephone Encounter (Signed)
Try to call the patient again after clinic at approximately 5:41 PM to discuss her test results for which she has been calling the office several times over the last couple days.  However, she is unable to be reached at the phone number that is provided.   I have not been given consent to discuss her medical information w/ her family so therefore did not call other contacts noted in Epic.   Tessa Lerner, [place], [facility]  Pager: [phone] Office: [phone]

## 2021-06-06 ENCOUNTER — Telehealth: Payer: Self-pay | Admitting: Cardiology

## 2021-06-06 NOTE — Telephone Encounter (Signed)
Spoke to pt this afternoon to confirm her appt on 1/12.  I asked if it was okay for both myself and a medical assistant to be in room during visit to ensure she understood treatment plan by Dr. Odis Hollingsheadolia.  She agreed and said she was looking forward to visit on 06/09/21.

## 2021-06-06 NOTE — Telephone Encounter (Signed)
I was able to get a hold of the patient over the phone and we did review the results of the echocardiogram.  Patient states that she just does not feel good.  She is experiencing left-sided arm, hip, leg pain which she is attributing to her underlying sciatica.  She continues to smoke on a daily basis but is trying to quit.  Her questions regarding her echocardiogram results reviewed.  Emphasized the importance of following up at her regular scheduled office visit.  In the past patient has refused a sooner appointment.  She was thankful for reaching out to her.  Tessa Lerner, [place], [facility]  Pager: [phone] Office: [phone]

## 2021-06-09 ENCOUNTER — Encounter: Payer: Self-pay | Admitting: Cardiology

## 2021-06-09 ENCOUNTER — Other Ambulatory Visit: Payer: Self-pay

## 2021-06-09 ENCOUNTER — Ambulatory Visit: Payer: 59 | Admitting: Cardiology

## 2021-06-09 VITALS — BP 123/63 | HR 78 | Temp 97.0°F | Resp 16 | Ht 63.0 in | Wt 192.0 lb

## 2021-06-09 DIAGNOSIS — I2510 Atherosclerotic heart disease of native coronary artery without angina pectoris: Secondary | ICD-10-CM

## 2021-06-09 DIAGNOSIS — I700 Atherosclerosis of aorta: Secondary | ICD-10-CM

## 2021-06-09 DIAGNOSIS — E1169 Type 2 diabetes mellitus with other specified complication: Secondary | ICD-10-CM

## 2021-06-09 DIAGNOSIS — Z712 Person consulting for explanation of examination or test findings: Secondary | ICD-10-CM

## 2021-06-09 DIAGNOSIS — I059 Rheumatic mitral valve disease, unspecified: Secondary | ICD-10-CM

## 2021-06-09 DIAGNOSIS — F17210 Nicotine dependence, cigarettes, uncomplicated: Secondary | ICD-10-CM

## 2021-06-09 DIAGNOSIS — R0609 Other forms of dyspnea: Secondary | ICD-10-CM

## 2021-06-09 DIAGNOSIS — J439 Emphysema, unspecified: Secondary | ICD-10-CM

## 2021-06-09 NOTE — Progress Notes (Signed)
Date:  06/09/2021   ID:  Rose [name], DOB 31-Aug-1955, MRN [id number]  PCP:  Roselee Nova, MD  Cardiologist:  Rex Kras, DO, [facility]  (established care 05/06/2021) Former Cardiology Providers: [facility]  Former Cardiothoracic Surgery: Dr. Doree Barthel [facility]).   Date: 06/09/21 Last Office Visit: 05/06/2021  Chief Complaint  Patient presents with   Chest Pain   Results   Follow-up   Shortness of Breath    HPI  Rose [name] is a 66 y.o. Caucasian female who presents to the office with a chief complaint of " review results and reevaluate shortness of breath and chest pain." Patient's past medical history and cardiovascular risk factors include: Hx of Stroke, Hx of pulmonary embolism (per patient, not sure what year), Mitral regurgitation, History of alcohol dependence, fibromyalgia, aortic atherosclerosis, coronary artery calcification and on gated CT study, obesity due to excess calories, chronic pancreatitis, cigarette smoking, COPD, GERD, marijuana use, obesity due to excess calories, type 2 diabetes mellitus.  She is referred to the office at the request of Roselee Nova, MD for evaluation of aortic atherosclerosis.  Prior to establishing care she was being followed by [facility] for mitral valve disease but has not seen her cardiologist in approximately 4 years.  During initial consultation she was complaining of chest pain which did not appear to be cardiac in etiology; however, given her multiple cardiovascular risk factors that shared decision was to proceed with stress test.  Results of the stress test reviewed with her in great detail and noted below for further reference.  With regards to symptoms she has not had reoccurrence of chest pain since her ER visit. Patient did go to the ED since last office visit for evaluation of chest pain high sensitive troponins were negative x2.  And results of the CT PE protocol reviewed.  She  was also complaining of shortness of breath with effort related activities predominantly with house chores.  Given her underlying mitral valve disease she did undergo echocardiogram which notes concern for both mitral regurgitation and stenosis.  She continues to smoke 2 to 3 cigarettes/day despite having COPD.  She was noted to have left carotid bruit and therefore underwent a carotid duplex which showed no significant stenosis most likely due to tortuosity.  Of note, Environmental education officer was present in the room during today's encounter.  Patient provided verbal consent with regards to discussing her medical information in the presence of the Environmental education officer.  FUNCTIONAL STATUS: No structured exercise program or daily routine.   ALLERGIES: Allergies  Allergen Reactions   Erythromycin Shortness Of Breath   Nickel Rash and Shortness Of Breath   Oxycodone Shortness Of Breath   Meperidine Other (See Comments)    Confusion and tingling  (noted in H&P) Confusion and tingling  (noted in H&P)    Codeine Swelling   Hydrocodone-Acetaminophen Itching    Pt reports she can take the 7.5-325 strength.     Morphine Itching   Propoxyphene Other (See Comments)   Quetiapine     Other reaction(s): Other (See Comments) Restless leg syndrome    MEDICATION LIST PRIOR TO VISIT: Current Meds  Medication Sig   aspirin EC 81 MG tablet Take 1 tablet (81 mg total) by mouth daily. Swallow whole.   clonazePAM (KLONOPIN) 1 MG tablet Take 1 mg by mouth 2 (two) times daily.   clotrimazole-betamethasone (LOTRISONE) cream Apply 1 application topically 2 (two) times daily.   gabapentin (  NEURONTIN) 600 MG tablet Take 600 mg by mouth 3 (three) times daily.   metFORMIN (GLUCOPHAGE) 500 MG tablet Take 500 mg by mouth 2 (two) times daily.   Multiple Vitamins-Minerals (CENTRUM ADULTS PO) Take 1 tablet by mouth daily.   omeprazole (PRILOSEC) 40 MG capsule TAKE 1 CAPSULE BY MOUTH 30 MINUTES BEFORE morning meal    rosuvastatin (CRESTOR) 40 MG tablet Take 1 tablet (40 mg total) by mouth at bedtime.   sucralfate (CARAFATE) 1 g tablet Take 1 g by mouth 2 (two) times daily.   traZODone (DESYREL) 150 MG tablet Take 450 mg by mouth at bedtime.     PAST MEDICAL HISTORY: Past Medical History:  Diagnosis Date   Allergy    Asthma    Back pain    Chronic headaches    Class 1 obesity due to excess calories with serious comorbidity in adult    COPD (chronic obstructive pulmonary disease) (HCC)    Diabetes (HCC)    Headache    Herniated lumbar intervertebral disc    High cholesterol    History of blood clots    Lumbago with sciatica, unspecified side    Moderate episode of recurrent major depressive disorder (HCC)    Nicotine dependence, cigarettes, with other nicotine-induced disorders    Nonrheumatic aortic (valve) insufficiency    Other chronic pain    Pain in joint involving ankle and foot    Pancreatitis    Presence urogenital implant    Stroke (HCC)    SUI (stress urinary incontinence, female)    Type 2 diabetes mellitus with hyperglycemia, with long-term current use of insulin ([place])     PAST SURGICAL HISTORY: Past Surgical History:  Procedure Laterality Date   ANKLE SURGERY     x 3   BREAST REDUCTION SURGERY     CHOLECYSTECTOMY  1998   HERNIA REPAIR     VAGINAL HYSTERECTOMY  1988    FAMILY HISTORY: The patient family history includes Cancer in her mother and sister; Emphysema in her mother; Heart disease in her father and mother.  SOCIAL HISTORY:  The patient  reports that she has been smoking cigarettes and e-cigarettes. She has a 23.50 pack-year smoking history. She has never used smokeless tobacco. She reports that she does not currently use drugs after having used the following drugs: Marijuana. She reports that she does not drink alcohol.  REVIEW OF SYSTEMS: Review of Systems  Constitutional: Negative for chills and fever.  HENT:  Negative for hoarse voice and nosebleeds.    Eyes:  Negative for discharge, double vision and pain.  Cardiovascular:  Positive for dyspnea on exertion (chronic and stable). Negative for chest pain, claudication, leg swelling, near-syncope, orthopnea, palpitations, paroxysmal nocturnal dyspnea and syncope.  Respiratory:  Negative for hemoptysis and shortness of breath.   Musculoskeletal:  Negative for muscle cramps and myalgias.  Gastrointestinal:  Negative for abdominal pain, constipation, diarrhea, hematemesis, hematochezia, melena, nausea and vomiting.  Neurological:  Negative for dizziness and light-headedness.   PHYSICAL EXAM: Vitals with BMI 06/09/2021 06/02/2021 06/02/2021  Height _0  - -  Weight 192 lbs - -  BMI 74.82 - -  Systolic 707 867 544  Diastolic 63 76 73  Pulse 78 83 75  Some encounter information is confidential and restricted. Go to Review Flowsheets activity to see all data.    CONSTITUTIONAL: Appears older than stated age, poor historian, not well-nourished. No acute distress.  SKIN: Skin is warm and dry. No rash noted. No cyanosis. No  pallor. No jaundice HEAD: Normocephalic and atraumatic.  EYES: No scleral icterus MOUTH/THROAT: Moist oral membranes.  NECK: No JVD present. No thyromegaly noted.  Left carotid bruits  LYMPHATIC: No visible cervical adenopathy.  CHEST Normal respiratory effort. No intercostal retractions  LUNGS: Decreased breath sounds bilaterally.  No stridor. No wheezes. No rales.  CARDIOVASCULAR: Regular rate and rhythm, positive S1-S2, 3 out of 6 holosystolic murmur heard at the apex with radiation to the axilla, no rubs or gallops appreciated. ABDOMINAL: Obese, soft, nontender, nondistended, positive bowel sounds in all 4 quadrants, no apparent ascites.  Mild ventral hernia. EXTREMITIES: No peripheral edema, warm to touch, +2 DP and PT pulses HEMATOLOGIC: No significant bruising.  NEUROLOGIC: Oriented to person, place, and time. Nonfocal. Normal muscle tone.  PSYCHIATRIC: Normal mood and  affect. Normal behavior. Cooperative  CARDIAC DATABASE: EKG: 05/06/2021: Normal sinus rhythm, 93 bpm, normal axis, LAE, without underlying ischemia or injury pattern.  Echocardiogram: 05/18/2021: Normal LV systolic function with visual EF 50-55%. Left ventricle cavity is normal in size. Mild concentric hypertrophy of the left ventricle. Normal global wall motion. Diastolic function not assessed due to severity of mitral stenosis and regurgitation. Left atrial cavity is severely dilated at 56 cc/m2. Structurally normal trileaflet aortic valve with race aortic valve stenosis. No regurgitation.  Mitral valve thickening without significant calcification Restricted mitral valve excursion. Severe mitral stenosis. Mean PG 12 mmHg, MVA 0.9 mm2 by PHT method at HR 72 bpm. Moderate to severe regurgitation. Consider rheumatic valve disease. Moderate tricuspid regurgitation. Estimated pulmonary artery systolic pressure 48 mmHg.   Stress Testing: Exercise nuclear stress test 05/18/2021: Normal ECG stress. The patient exercised for 4 minutes and 32 seconds of a Bruce protocol, achieving approximately 4.64 METs. Reduced exercise tolerance. The blood pressure response was normal. Myocardial perfusion is normal. Overall LV systolic function is normal without regional wall motion abnormalities. Stress LV EF: 67%.  Increased RV uptake suggests RVH.  No previous exam available for comparison. Low risk study.   Heart Catheterization: None  CT chest lung cancer screening: 10/04/2020: 1. Lung-RADS 2, benign appearance or behavior. Continue annual screening with low-dose chest CT without contrast in 12 months. 2. Coronary artery calcifications noted. 3. Changes of chronic pancreatitis.  Aortic Atherosclerosis (ICD10-I70.0) and Emphysema (ICD10-J43.9).  Carotid artery duplex 05/18/2021: No hemodynamically significant arterial disease in the internal carotid artery bilaterally.  Tortuosity of the left carotid  artery may be the source of bruit.  Antegrade right vertebral artery flow. Antegrade left vertebral artery flow. Normal study.  LABORATORY DATA: CBC Latest Ref Rng & Units 06/02/2021 01/13/2021 10/30/2020  WBC 4.0 - 10.5 K/uL 4.6 6.6 7.1  Hemoglobin 12.0 - 15.0 g/dL 10.2(L) 10.3(L) 12.6  Hematocrit 36.0 - 46.0 % 31.9(L) 32.1(L) 38.4  Platelets 150 - 400 K/uL 198 178 233    CMP Latest Ref Rng & Units 06/02/2021 01/13/2021 10/30/2020  Glucose 70 - 99 mg/dL 134(H) 100(H) 135(H)  BUN 8 - 23 mg/dL _0 Creatinine 0.44 - 1.00 mg/dL 0.97 0.87 0.88  Sodium 135 - 145 mmol/L 137 138 137  Potassium 3.5 - 5.1 mmol/L 4.1 3.4(L) 4.1  Chloride 98 - 111 mmol/L 105 106 104  CO2 22 - 32 mmol/L _1 Calcium 8.9 - 10.3 mg/dL 8.5(L) 8.4(L) 8.7(L)  Total Protein 6.5 - 8.1 g/dL - 6.5 -  Total Bilirubin 0.3 - 1.2 mg/dL - 0.5 -  Alkaline Phos 38 - 126 U/L - 90 -  AST 15 - 41 U/L -  21 -  ALT 0 - 44 U/L - 17 -    Lipid Panel     Component Value Date/Time   CHOL 158 12/13/2018 0647   TRIG 133 12/13/2018 0647   HDL 51 12/13/2018 0647   CHOLHDL 3.1 12/13/2018 0647   VLDL 27 12/13/2018 0647   LDLCALC 80 12/13/2018 0647    No components found for: NTPROBNP No results for input(s): PROBNP in the last 8760 hours. No results for input(s): TSH in the last 8760 hours.  BMP Recent Labs    10/30/20 0026 01/13/21 2202 06/02/21 1304  NA 137 138 137  K 4.1 3.4* 4.1  CL 104 106 105  CO2 _0 GLUCOSE 135* 100* 134*  BUN _1 CREATININE 0.88 0.87 0.97  CALCIUM 8.7* 8.4* 8.5*  GFRNONAA >60 >60 >60    HEMOGLOBIN A1C Lab Results  Component Value Date   HGBA1C 5.4 12/13/2018   MPG 108.28 12/13/2018   External Labs:  Date Collected: 03/14/2021 , information obtained by referring provider Potassium: 4.5 Creatinine 0.97 mg/dL. eGFR: 65 mL/min per 1.73 m Hemoglobin: 11.0 g/dL and hematocrit: 34.1 % Lipid profile: Total cholesterol 154 , triglycerides 139 , HDL 53 , LDL 77 AST: 22 , ALT:  13 , alkaline phosphatase: 100  TSH: 1.30    IMPRESSION:    ICD-10-CM   1. Mitral valve disease  I05.9     2. Dyspnea on exertion  R06.09     3. Coronary artery calcification seen on computed tomography  I25.10     4. Atherosclerosis of aorta (HCC)  I70.0     5. Type 2 diabetes mellitus with hyperlipidemia (HCC)  E11.69    E78.5     6. Pulmonary emphysema  J43.9     7. Cigarette smoker  F17.210     8. Encounter to discuss test results  Z71.2        RECOMMENDATIONS: Freeda Spivey is a 66 y.o. Caucasian female whose past medical history and cardiac risk factors include: Hx of Stroke, Hx of pulmonary embolism (per patient, not sure what year), Mitral regurgitation, History of alcohol dependence, fibromyalgia, aortic atherosclerosis, coronary artery calcification and on gated CT study, obesity due to excess calories, chronic pancreatitis, cigarette smoking, COPD, GERD, marijuana use, obesity due to excess calories, type 2 diabetes mellitus.  Mitral valve disease Echocardiogram notes presence of both mitral valve regurgitation and stenosis raising suspicion for possible rheumatic disease. Given the severity of mitral valve disease , shortness of breath with day-to-day activities, moderate TR, at least mild pulmonary hypertension per echo we discussed the management of mitral valve disease.  Patient was given the option of treating her disease medically, seeing another cardiology provider for second opinion, or proceeding with transesophageal echocardiogram to further evaluate the mitral valve with the intention of intervening on it if deemed necessary.  Patient would like to proceed with transesophageal echocardiogram to better evaluate the mitral valve. After careful review of history and examination, the risks, benefits of transesophageal echocardiogram, and alternatives have been explained to the patient. Complications include but not limited to esophageal perforation (rare),  gastrointestinal bleeding (rare), cardiac arrhythmia which can include cardiac arrest and death (rare), pharyngeal irritation / discomfort with swallowing / hematoma, methemoglobinemia, bronchospasm, transient hypoxia, nonsustained ventricular tachycardia, transient atrial fibrillation, minimal hemoptysis, vomiting, hypotension, respiratory compromise, reaction to medications, unavoidable damage to teeth and gums, aspiration pneumonia  were reviewed with the patient.  Patient voices understands, provides verbal feedback, questions answered,  and patient  wishes to proceed with the procedure.  Patient use to follow up with Dr. Colon Branch and recalls having an EGD and possible dilatation.  I have requested the patient to follow-up with her gastroenterologist to be reevaluated and to be cleared for TEE prior to the procedure being scheduled.  We will wait for clearance letter.  Patient also has dental caries for which she has a dental appointment later this month.  Dyspnea on exertion Multifactorial: Underlying valvular heart disease, COPD, active cigarette smoking, etc. Valve disease management as discussed above. Will defer management of COPD to her other medical providers. Educated on the importance of complete smoking cessation. Has undergone myocardial perfusion imaging which was reported to be low risk study. Patient is educated on seeking medical attention if her symptoms increase in intensity, frequency, duration by going to the closest ER for further evaluation and management.  Coronary artery calcification seen on computed tomography  Atherosclerosis of aorta (HCC) Continue aspirin and statin therapy Results of the echo and stress test discussed with the patient at today's office visit.  Type 2 diabetes mellitus with hyperlipidemia (HCC) Currently being managed per primary team. Currently on metformin. Consider initiation of ACE inhibitor's/ARB, Jardiance -defer to primary team. Will  uptitrate statin therapy as discussed above Outside labs independently reviewed.  Pulmonary emphysema / Cigarette smoker Tobacco cessation counseling: Currently smoking 2 to 3 cigarettes/day. Patient was informed of the dangers of tobacco abuse including stroke, cancer, and MI, as well as benefits of tobacco cessation. Patient is not willing to quit at this time. 5 mins were spent counseling patient cessation techniques. We discussed various methods to help quit smoking, including deciding on a date to quit, joining a support group, pharmacological agents- nicotine gum/patch/lozenges.  I will reassess her progress at the next follow-up visit  Patient was instructed to follow-up today with her next of kin which is her daughter so that both are aware of the current findings, discuss plan of care, and answer any questions that may arise.  However, patient's daughter called the office that she was not able to accompany her (either today or any other day). Spent 40 minutes with the patient to discuss the test results, plan of care, diagnostic testing required to address the mitral valve, offered her a second opinion as well. Her questions and concerns were addressed to her satisfaction.  She understands the risks, benefits, and alternatives to transesophageal echocardiogram.  She is more than welcome to call the office if any questions or concerns arise or come back for follow-up appointment.  Patient will need BMP, CBC prior to her transesophageal echocardiogram.  But will await for GI clearance prior to scheduling her TEE.  FINAL MEDICATION LIST END OF ENCOUNTER: No orders of the defined types were placed in this encounter.    Medications Discontinued During This Encounter  Medication Reason   DULoxetine (CYMBALTA) 60 MG capsule    Omega-3 Fatty Acids (FISH OIL PO)    PROAIR HFA 108 (90 Base) MCG/ACT inhaler      Current Outpatient Medications:    aspirin EC 81 MG tablet, Take 1 tablet (81 mg  total) by mouth daily. Swallow whole., Disp: 30 tablet, Rfl: 11   clonazePAM (KLONOPIN) 1 MG tablet, Take 1 mg by mouth 2 (two) times daily., Disp: , Rfl:    clotrimazole-betamethasone (LOTRISONE) cream, Apply 1 application topically 2 (two) times daily., Disp: , Rfl:    gabapentin (NEURONTIN) 600 MG tablet, Take 600 mg by mouth  3 (three) times daily., Disp: , Rfl:    metFORMIN (GLUCOPHAGE) 500 MG tablet, Take 500 mg by mouth 2 (two) times daily., Disp: , Rfl:    Multiple Vitamins-Minerals (CENTRUM ADULTS PO), Take 1 tablet by mouth daily., Disp: , Rfl:    omeprazole (PRILOSEC) 40 MG capsule, TAKE 1 CAPSULE BY MOUTH 30 MINUTES BEFORE morning meal, Disp: , Rfl:    rosuvastatin (CRESTOR) 40 MG tablet, Take 1 tablet (40 mg total) by mouth at bedtime., Disp: 90 tablet, Rfl: 0   sucralfate (CARAFATE) 1 g tablet, Take 1 g by mouth 2 (two) times daily., Disp: , Rfl:    traZODone (DESYREL) 150 MG tablet, Take 450 mg by mouth at bedtime., Disp: , Rfl:   No orders of the defined types were placed in this encounter.   There are no Patient Instructions on file for this visit.   --Continue cardiac medications as reconciled in final medication list. --Return in about 6 weeks (around 07/21/2021) for Follow up after TEE . Or sooner if needed. --Continue follow-up with your primary care physician regarding the management of your other chronic comorbid conditions.  Patient's questions and concerns were addressed to her satisfaction. She voices understanding of the instructions provided during this encounter.   This note was created using a voice recognition software as a result there may be grammatical errors inadvertently enclosed that do not reflect the nature of this encounter. Every attempt is made to correct such errors.  Rex Kras, [place], [facility]  Pager: [phone] Office: [phone]

## 2021-06-13 ENCOUNTER — Ambulatory Visit: Payer: 59 | Admitting: Cardiology

## 2021-06-23 ENCOUNTER — Encounter (HOSPITAL_COMMUNITY): Payer: Self-pay

## 2021-06-23 ENCOUNTER — Emergency Department (HOSPITAL_COMMUNITY): Payer: 59

## 2021-06-23 ENCOUNTER — Emergency Department (HOSPITAL_COMMUNITY)
Admission: EM | Admit: 2021-06-23 | Discharge: 2021-06-23 | Discharge status: Against Medical Advice | Payer: 59 | Attending: Emergency Medicine | Admitting: Emergency Medicine

## 2021-06-23 DIAGNOSIS — R059 Cough, unspecified: Secondary | ICD-10-CM | POA: Diagnosis not present

## 2021-06-23 DIAGNOSIS — Z5321 Procedure and treatment not carried out due to patient leaving prior to being seen by health care provider: Secondary | ICD-10-CM | POA: Insufficient documentation

## 2021-06-23 DIAGNOSIS — Z20822 Contact with and (suspected) exposure to covid-19: Secondary | ICD-10-CM | POA: Diagnosis not present

## 2021-06-23 DIAGNOSIS — R6883 Chills (without fever): Secondary | ICD-10-CM | POA: Insufficient documentation

## 2021-06-23 DIAGNOSIS — R0602 Shortness of breath: Secondary | ICD-10-CM | POA: Insufficient documentation

## 2021-06-23 LAB — CBC WITH DIFFERENTIAL/PLATELET
Abs Immature Granulocytes: 0.03 10*3/uL (ref 0.00–0.07)
Basophils Absolute: 0 10*3/uL (ref 0.0–0.1)
Basophils Relative: 0 %
Eosinophils Absolute: 0 10*3/uL (ref 0.0–0.5)
Eosinophils Relative: 0 %
HCT: 31.6 % — ABNORMAL LOW (ref 36.0–46.0)
Hemoglobin: 10.1 g/dL — ABNORMAL LOW (ref 12.0–15.0)
Immature Granulocytes: 1 %
Lymphocytes Relative: 5 %
Lymphs Abs: 0.3 10*3/uL — ABNORMAL LOW (ref 0.7–4.0)
MCH: 26 pg (ref 26.0–34.0)
MCHC: 32 g/dL (ref 30.0–36.0)
MCV: 81.2 fL (ref 80.0–100.0)
Monocytes Absolute: 0.1 10*3/uL (ref 0.1–1.0)
Monocytes Relative: 1 %
Neutro Abs: 5.4 10*3/uL (ref 1.7–7.7)
Neutrophils Relative %: 93 %
Platelets: 184 10*3/uL (ref 150–400)
RBC: 3.89 MIL/uL (ref 3.87–5.11)
RDW: 17.6 % — ABNORMAL HIGH (ref 11.5–15.5)
WBC: 5.8 10*3/uL (ref 4.0–10.5)
nRBC: 0 % (ref 0.0–0.2)

## 2021-06-23 LAB — BASIC METABOLIC PANEL
Anion gap: 14 (ref 5–15)
BUN: 13 mg/dL (ref 8–23)
CO2: 20 mmol/L — ABNORMAL LOW (ref 22–32)
Calcium: 8.4 mg/dL — ABNORMAL LOW (ref 8.9–10.3)
Chloride: 103 mmol/L (ref 98–111)
Creatinine, Ser: 1.09 mg/dL — ABNORMAL HIGH (ref 0.44–1.00)
GFR, Estimated: 57 mL/min — ABNORMAL LOW (ref >60)
Glucose, Bld: 419 mg/dL — ABNORMAL HIGH (ref 70–99)
Potassium: 3.3 mmol/L — ABNORMAL LOW (ref 3.5–5.1)
Sodium: 137 mmol/L (ref 135–145)

## 2021-06-23 LAB — RESP PANEL BY RT-PCR (FLU A&B, COVID) ARPGX2
Influenza A by PCR: NEGATIVE
Influenza B by PCR: NEGATIVE
SARS Coronavirus 2 by RT PCR: NEGATIVE

## 2021-06-23 LAB — TROPONIN I (HIGH SENSITIVITY): Troponin I (High Sensitivity): 7 ng/L (ref <18)

## 2021-06-23 NOTE — ED Provider Triage Note (Signed)
Emergency Medicine Provider Triage Evaluation Note  Rose [name] , a 66 y.o. female  was evaluated in triage.  Pt complains of SOB.  Seen about 2 hours ago by EMS, given treatments at house but refused transport.  Called EMS again due to persistent SOB.  EMS reports she did appear to be smoking in the house.  Hx of COPD.  Does report some cough and chills recent but negative covid/flu testing.  Review of Systems  Positive: SOB, chills Negative: Chest pain  Physical Exam  BP (!) 152/66 (BP Location: Right Arm)    Pulse 97    Temp 98 F (36.7 C) (Oral)    Resp 18    SpO2 93%   Gen:   Awake, no distress   Resp:  Normal effort  MSK:   Moves extremities without difficulty  Other:    Medical Decision Making  Medically screening exam initiated at 2:00 AM.  Appropriate orders placed.  Rose [name] was informed that the remainder of the evaluation will be completed by another provider, this initial triage assessment does not replace that evaluation, and the importance of remaining in the ED until their evaluation is complete.  SOB.  Initial sats borderline so started on 2L in triage, does not generally use home O2.  Otherwise VSS.  Will check EKG, labs, CXR, covid/flu screen.   Garlon Hatchet, PA-C 06/23/21 0207

## 2021-06-23 NOTE — ED Notes (Signed)
Pt did not respond when called for vitals check X3  

## 2021-06-23 NOTE — ED Triage Notes (Signed)
Pt comes via [facility] for SOB, pt called EMS earlier for the same and was given 10 albuterol, 1 mg atrovent 125 solumedrol, 2gm mag and she felt better and refused EMS transport, pt called back out for continued SOB.

## 2021-07-05 NOTE — Progress Notes (Signed)
Vitals:   05/18/21 1052  BP: 113/63  Pulse: 84  SpO2: 94%

## 2021-07-08 ENCOUNTER — Other Ambulatory Visit: Payer: Self-pay | Admitting: Emergency Medicine

## 2021-07-22 ENCOUNTER — Ambulatory Visit: Payer: 59 | Admitting: Cardiology

## 2021-07-22 ENCOUNTER — Telehealth: Payer: Self-pay | Admitting: Emergency Medicine

## 2021-07-22 ENCOUNTER — Other Ambulatory Visit: Payer: Self-pay | Admitting: Emergency Medicine

## 2021-07-22 NOTE — Telephone Encounter (Signed)
Called and spoke with pt. Pt stated she has been told by her cardiologist Dr. Odis Hollingsheadolia that they were going to be either repairing/replacing the left mitral valve.  Pt does not know when the surgery is going to be. Asked pt if she knew if they were going to require her to have pulmonary clearance and she said that she was not sure.  Routing to Dr. Delton CoombesByrum as an [place] ChildesFYI.

## 2021-07-25 NOTE — Telephone Encounter (Signed)
Suspect that they will want pre-op risk eval. We will make her an OV when we know when the surgery is going to be

## 2021-07-26 ENCOUNTER — Ambulatory Visit: Payer: 59 | Admitting: Cardiology

## 2021-08-02 ENCOUNTER — Other Ambulatory Visit: Payer: Self-pay | Admitting: Family Medicine

## 2021-08-02 DIAGNOSIS — F17210 Nicotine dependence, cigarettes, uncomplicated: Secondary | ICD-10-CM

## 2021-08-03 ENCOUNTER — Other Ambulatory Visit: Payer: Self-pay

## 2021-08-03 MED ORDER — ALBUTEROL SULFATE HFA 108 (90 BASE) MCG/ACT IN AERS: 2.0000 | INHALATION_SPRAY | Freq: Four times a day (QID) | RESPIRATORY_TRACT | 2 refills | Status: DC | PRN

## 2021-08-05 ENCOUNTER — Ambulatory Visit: Payer: 59 | Admitting: Emergency Medicine

## 2021-08-08 ENCOUNTER — Ambulatory Visit: Payer: 59 | Admitting: Cardiology

## 2021-08-24 ENCOUNTER — Other Ambulatory Visit: Payer: 59

## 2021-10-19 ENCOUNTER — Other Ambulatory Visit: Payer: Self-pay | Admitting: Family Medicine

## 2021-10-19 DIAGNOSIS — R590 Localized enlarged lymph nodes: Secondary | ICD-10-CM

## 2021-10-19 DIAGNOSIS — E2839 Other primary ovarian failure: Secondary | ICD-10-CM

## 2021-10-25 ENCOUNTER — Other Ambulatory Visit: Payer: 59

## 2021-10-26 ENCOUNTER — Other Ambulatory Visit: Payer: Self-pay | Admitting: Primary Care

## 2021-10-26 ENCOUNTER — Other Ambulatory Visit (HOSPITAL_COMMUNITY): Payer: Self-pay | Admitting: Orthopedic Surgery

## 2021-10-26 NOTE — Progress Notes (Signed)
Patient's chart, cardiac studies and cards notes reviewed with Dr Roanna Banning. Note from 07-22-21 states that patient needs MVR for severe Mitral stenosis. Patient was to follow-up and did not. Per Dr Roanna Banning, patient will need to be done at [facility]. Kerri at Dr [facility] office made aware.

## 2021-10-27 ENCOUNTER — Ambulatory Visit (HOSPITAL_BASED_OUTPATIENT_CLINIC_OR_DEPARTMENT_OTHER): Admit: 2021-10-27 | Payer: Medicare Other | Admitting: Orthopedic Surgery

## 2021-10-27 ENCOUNTER — Encounter (HOSPITAL_BASED_OUTPATIENT_CLINIC_OR_DEPARTMENT_OTHER): Payer: Self-pay

## 2021-10-27 ENCOUNTER — Encounter (HOSPITAL_COMMUNITY): Payer: Self-pay | Admitting: Orthopaedic Surgery

## 2021-10-27 SURGERY — OPEN REDUCTION INTERNAL FIXATION (ORIF) METATARSAL (TOE) FRACTURE
Anesthesia: Monitor Anesthesia Care | Site: Toe | Laterality: Left

## 2021-10-27 NOTE — Progress Notes (Signed)
Anesthesia Chart Review:  Pt is a same day work up   Case: [id number] Date/Time: 10/28/21 1116   Procedures:      IRRIGATION AND DEBRIDEMENT EXTREMITY, GREAT TOE (Right)     PERCUTANEOUS PINNING EXTREMITY, GREAT TOE (Right)   Anesthesia type: Monitor Anesthesia Care   Pre-op diagnosis: displaced fracture of distal phalanx of right great toe   Location: [facility] ROOM 09 / [facility]   Surgeons: Netta Cedars, MD       DISCUSSION: Pt is 66 years old with hx severe mitral stenosis with moderate to severe regurgitation, stroke, DM, asthma, COPD, OSA, fatty liver, anemia. Current smoker.   Pt reported shortness of breath to RN on pre-op call.   Last office visit 06/09/21 with cardiologist Dr. Odis Hollingshead documents severe MS. Gave pt options of treating disease medically, getting 2nd opinion, or getting TEE; pt elected to get TEE but this has not happened yet.   Reviewed case with Dr. Glade Stanford.    PROVIDERS: - PCP is Ellyn Hack, MD - Cardiologist is Tessa Lerner, DO  LABS: Will be obtained day of surgery    IMAGES: CXR 06/23/21:  1. No focal consolidation. 2. Mild chronic interstitial coarsening.  CT angio chest 06/02/21:  1. No pulmonary embolus. 2. No acute intrathoracic abnormality. 3. Aortic Atherosclerosis and Emphysema. 4. Enlarged left atrium. 5. Chronic pancreatitis.   EKG 06/23/21: NSR   CV: Echocardiogram: 05/18/2021: Normal LV systolic function with visual EF 50-55%. Left ventricle cavity is normal in size. Mild concentric hypertrophy of the left ventricle. Normal global wall motion. Diastolic function not assessed due to severity of mitral stenosis and regurgitation. Left atrial cavity is severely dilated at 56 cc/m2. Structurally normal trileaflet aortic valve with race aortic valve stenosis. No regurgitation.  Mitral valve thickening without significant calcification Restricted mitral valve excursion. Severe mitral stenosis. Mean PG 12 mmHg, MVA 0.9 mm2 by PHT  method at HR 72 bpm. Moderate to severe regurgitation. Consider rheumatic valve disease. Moderate tricuspid regurgitation. Estimated pulmonary artery systolic pressure 48 mmHg.   Exercise nuclear stress test 05/18/2021: Normal ECG stress. The patient exercised for 4 minutes and 32 seconds of a Bruce protocol, achieving approximately 4.64 METs. Reduced exercise tolerance. The blood pressure response was normal. Myocardial perfusion is normal. Overall LV systolic function is normal without regional wall motion abnormalities. Stress LV EF: 67%.  Increased RV uptake suggests RVH.   Carotid artery duplex 05/18/2021: No hemodynamically significant arterial disease in the internal carotid artery bilaterally.  Tortuosity of the left carotid artery may be the source of bruit.  Antegrade right vertebral artery flow. Antegrade left vertebral artery flow. Normal study.  Past Medical History:  Diagnosis Date   Allergy    Anemia    Anxiety    Arthritis    Asthma    Back pain    Chronic headaches    Class 1 obesity due to excess calories with serious comorbidity in adult    Complication of anesthesia    slow to wake up   COPD (chronic obstructive pulmonary disease) (HCC)    Diabetes (HCC)    Fatty liver    GERD (gastroesophageal reflux disease)    Headache    Herniated lumbar intervertebral disc    High cholesterol    History of blood clots    Lumbago with sciatica, unspecified side    Mitral stenosis    severe by 05/18/21 echo   Moderate episode of recurrent major depressive disorder (HCC)  Nicotine dependence, cigarettes, with other nicotine-induced disorders    Nonrheumatic aortic (valve) insufficiency    Other chronic pain    Pain in joint involving ankle and foot    Pancreatitis    Pneumonia    Presence urogenital implant    Sleep apnea    no cpap   Stroke (HCC)    mild   SUI (stress urinary incontinence, female)    Type 2 diabetes mellitus with hyperglycemia, with long-term  current use of insulin (HCC)     Past Surgical History:  Procedure Laterality Date   ANKLE SURGERY     x 3   Bladder tack     BREAST REDUCTION SURGERY     CHOLECYSTECTOMY  1998   HERNIA REPAIR     VAGINAL HYSTERECTOMY  1988    MEDICATIONS: No current facility-administered medications for this encounter.    pantoprazole (PROTONIX) 40 MG tablet   albuterol (VENTOLIN HFA) 108 (90 Base) MCG/ACT inhaler   albuterol (VENTOLIN HFA) 108 (90 Base) MCG/ACT inhaler   aspirin EC 81 MG tablet   clonazePAM (KLONOPIN) 1 MG tablet   clotrimazole-betamethasone (LOTRISONE) cream   gabapentin (NEURONTIN) 600 MG tablet   metFORMIN (GLUCOPHAGE) 500 MG tablet   Multiple Vitamins-Minerals (CENTRUM ADULTS PO)   omeprazole (PRILOSEC) 40 MG capsule   rosuvastatin (CRESTOR) 40 MG tablet   sucralfate (CARAFATE) 1 g tablet   traZODone (DESYREL) 150 MG tablet    Pt will need further eval by assigned anesthesiologist day of surgery. If no acute change in status and labs acceptable, I anticipate pt can proceed with surgery as scheduled.   Rica MastAngela Maguadalupe Lata, PhD, FNP-BC [facility] Phone: [phone] 10/27/2021 11:15 AM

## 2021-10-27 NOTE — Progress Notes (Signed)
Spoke with pt for pre-op call. When asked which foot and toe she was having surgery on she stated her right foot, great toe. The consent order has left great toe and the scheduled procedure says left foot, great toe. I have called Keri at Dr. Emelda Brothers office and she states she will have Dr. Odis Hollingshead change the consent order and she will change the procedure.  Pt has hx of diabetes. She states her last A1C was 5.9 at [facility]. I have called them and they are faxing that to me. She states her meter does not work. Instructed pt not to take Metformin in the AM.  Pt has recently been diagnosed with a "leaky mitral valve". She states she is to have surgery in the future for it. She also complains of needing Oxygen because of shortness of breath. Pt had no trouble talking during phone call.  Pt will be arriving via [place] and has a friend that will be coming with her. Her friend will be with her for the 24 hours after surgery.   Chart sent to Anesthesia PA.

## 2021-10-27 NOTE — Anesthesia Preprocedure Evaluation (Addendum)
Anesthesia Evaluation  Patient identified by MRN, date of birth, ID band Patient awake    Reviewed: Allergy & Precautions, NPO status , Patient's Chart, lab work & pertinent test results  Airway Mallampati: II  TM Distance: >3 FB Neck ROM: Full    Dental  (+) Poor Dentition, Chipped, Missing, Dental Advisory Given   Pulmonary asthma , sleep apnea , COPD,  COPD inhaler, Current Smoker,    breath sounds clear to auscultation       Cardiovascular + Valvular Problems/Murmurs  Rhythm:Regular Rate:Normal     Neuro/Psych  Headaches, PSYCHIATRIC DISORDERS Anxiety Depression  Neuromuscular disease CVA, Residual Symptoms    GI/Hepatic Neg liver ROS, GERD  Medicated,  Endo/Other  diabetes, Type 2, Oral Hypoglycemic Agents  Renal/GU negative Renal ROS     Musculoskeletal  (+) Arthritis ,   Abdominal Normal abdominal exam  (+)   Peds  Hematology   Anesthesia Other Findings   Reproductive/Obstetrics                            Anesthesia Physical Anesthesia Plan  ASA: 3  Anesthesia Plan: MAC   Post-op Pain Management:    Induction: Intravenous  PONV Risk Score and Plan: 0 and Propofol infusion  Airway Management Planned: Natural Airway and Simple Face Mask  Additional Equipment: None  Intra-op Plan:   Post-operative Plan:   Informed Consent: I have reviewed the patients History and Physical, chart, labs and discussed the procedure including the risks, benefits and alternatives for the proposed anesthesia with the patient or authorized representative who has indicated his/her understanding and acceptance.       Plan Discussed with: CRNA  Anesthesia Plan Comments: (See APP note by Durel Salts, FNP. Pt has severe mitral stenosis)       Anesthesia Quick Evaluation

## 2021-10-28 ENCOUNTER — Other Ambulatory Visit: Payer: Self-pay

## 2021-10-28 ENCOUNTER — Other Ambulatory Visit: Payer: 59

## 2021-10-28 ENCOUNTER — Ambulatory Visit (HOSPITAL_COMMUNITY): Payer: Medicare Other

## 2021-10-28 ENCOUNTER — Encounter (HOSPITAL_COMMUNITY): Payer: Self-pay | Admitting: Orthopaedic Surgery

## 2021-10-28 ENCOUNTER — Ambulatory Visit (HOSPITAL_BASED_OUTPATIENT_CLINIC_OR_DEPARTMENT_OTHER): Payer: Medicare Other | Admitting: Anesthesiology

## 2021-10-28 ENCOUNTER — Ambulatory Visit (HOSPITAL_COMMUNITY): Payer: Medicare Other | Admitting: Anesthesiology

## 2021-10-28 ENCOUNTER — Encounter (HOSPITAL_COMMUNITY)
Admission: RE | Disposition: A | Discharge status: Home / Self Care | Payer: Self-pay | Source: Home / Self Care | Attending: Orthopaedic Surgery

## 2021-10-28 ENCOUNTER — Ambulatory Visit (HOSPITAL_COMMUNITY)
Admission: RE | Admit: 2021-10-28 | Discharge: 2021-10-28 | Disposition: A | Discharge status: Home / Self Care | Payer: Medicare Other | Attending: Orthopaedic Surgery | Admitting: Orthopaedic Surgery

## 2021-10-28 DIAGNOSIS — S92404B Nondisplaced unspecified fracture of right great toe, initial encounter for open fracture: Secondary | ICD-10-CM | POA: Diagnosis not present

## 2021-10-28 DIAGNOSIS — K219 Gastro-esophageal reflux disease without esophagitis: Secondary | ICD-10-CM | POA: Diagnosis not present

## 2021-10-28 DIAGNOSIS — Z7984 Long term (current) use of oral hypoglycemic drugs: Secondary | ICD-10-CM

## 2021-10-28 DIAGNOSIS — E119 Type 2 diabetes mellitus without complications: Secondary | ICD-10-CM

## 2021-10-28 DIAGNOSIS — G4730 Sleep apnea, unspecified: Secondary | ICD-10-CM

## 2021-10-28 DIAGNOSIS — F17200 Nicotine dependence, unspecified, uncomplicated: Secondary | ICD-10-CM | POA: Diagnosis not present

## 2021-10-28 DIAGNOSIS — Z8673 Personal history of transient ischemic attack (TIA), and cerebral infarction without residual deficits: Secondary | ICD-10-CM | POA: Insufficient documentation

## 2021-10-28 DIAGNOSIS — M1990 Unspecified osteoarthritis, unspecified site: Secondary | ICD-10-CM | POA: Diagnosis not present

## 2021-10-28 DIAGNOSIS — J449 Chronic obstructive pulmonary disease, unspecified: Secondary | ICD-10-CM | POA: Insufficient documentation

## 2021-10-28 DIAGNOSIS — S92421B Displaced fracture of distal phalanx of right great toe, initial encounter for open fracture: Secondary | ICD-10-CM | POA: Insufficient documentation

## 2021-10-28 DIAGNOSIS — W228XXA Striking against or struck by other objects, initial encounter: Secondary | ICD-10-CM | POA: Diagnosis not present

## 2021-10-28 DIAGNOSIS — G4733 Obstructive sleep apnea (adult) (pediatric): Secondary | ICD-10-CM | POA: Insufficient documentation

## 2021-10-28 DIAGNOSIS — G709 Myoneural disorder, unspecified: Secondary | ICD-10-CM | POA: Insufficient documentation

## 2021-10-28 DIAGNOSIS — F419 Anxiety disorder, unspecified: Secondary | ICD-10-CM | POA: Diagnosis not present

## 2021-10-28 DIAGNOSIS — F32A Depression, unspecified: Secondary | ICD-10-CM | POA: Insufficient documentation

## 2021-10-28 DIAGNOSIS — K760 Fatty (change of) liver, not elsewhere classified: Secondary | ICD-10-CM | POA: Insufficient documentation

## 2021-10-28 HISTORY — DX: Unspecified osteoarthritis, unspecified site: M19.90

## 2021-10-28 HISTORY — DX: Anemia, unspecified: D64.9

## 2021-10-28 HISTORY — DX: Other complications of anesthesia, initial encounter: T88.59XA

## 2021-10-28 HISTORY — DX: Anxiety disorder, unspecified: F41.9

## 2021-10-28 HISTORY — PX: I & D EXTREMITY: SHX5045

## 2021-10-28 HISTORY — DX: Rheumatic mitral stenosis: I05.0

## 2021-10-28 HISTORY — DX: Fatty (change of) liver, not elsewhere classified: K76.0

## 2021-10-28 HISTORY — DX: Sleep apnea, unspecified: G47.30

## 2021-10-28 HISTORY — DX: Gastro-esophageal reflux disease without esophagitis: K21.9

## 2021-10-28 HISTORY — DX: Pneumonia, unspecified organism: J18.9

## 2021-10-28 LAB — COMPREHENSIVE METABOLIC PANEL
ALT: 17 U/L (ref 0–44)
AST: 32 U/L (ref 15–41)
Albumin: 3.6 g/dL (ref 3.5–5.0)
Alkaline Phosphatase: 113 U/L (ref 38–126)
Anion gap: 9 (ref 5–15)
BUN: 10 mg/dL (ref 8–23)
CO2: 25 mmol/L (ref 22–32)
Calcium: 8.9 mg/dL (ref 8.9–10.3)
Chloride: 105 mmol/L (ref 98–111)
Creatinine, Ser: 1.03 mg/dL — ABNORMAL HIGH (ref 0.44–1.00)
GFR, Estimated: >60 mL/min (ref >60)
Glucose, Bld: 124 mg/dL — ABNORMAL HIGH (ref 70–99)
Potassium: 4.3 mmol/L (ref 3.5–5.1)
Sodium: 139 mmol/L (ref 135–145)
Total Bilirubin: 0.4 mg/dL (ref 0.3–1.2)
Total Protein: 6.3 g/dL — ABNORMAL LOW (ref 6.5–8.1)

## 2021-10-28 LAB — CBC
HCT: 32.9 % — ABNORMAL LOW (ref 36.0–46.0)
Hemoglobin: 10.6 g/dL — ABNORMAL LOW (ref 12.0–15.0)
MCH: 26.7 pg (ref 26.0–34.0)
MCHC: 32.2 g/dL (ref 30.0–36.0)
MCV: 82.9 fL (ref 80.0–100.0)
Platelets: 197 10*3/uL (ref 150–400)
RBC: 3.97 MIL/uL (ref 3.87–5.11)
RDW: 17.4 % — ABNORMAL HIGH (ref 11.5–15.5)
WBC: 4.2 10*3/uL (ref 4.0–10.5)
nRBC: 0 % (ref 0.0–0.2)

## 2021-10-28 LAB — GLUCOSE, CAPILLARY
Glucose-Capillary: 136 mg/dL — ABNORMAL HIGH (ref 70–99)
Glucose-Capillary: 124 mg/dL — ABNORMAL HIGH (ref 70–99)
Glucose-Capillary: 114 mg/dL — ABNORMAL HIGH (ref 70–99)

## 2021-10-28 LAB — SURGICAL PCR SCREEN
MRSA, PCR: NEGATIVE
Staphylococcus aureus: NEGATIVE

## 2021-10-28 SURGERY — IRRIGATION AND DEBRIDEMENT EXTREMITY
Anesthesia: Monitor Anesthesia Care | Site: Toe | Laterality: Right

## 2021-10-28 MED ORDER — CHLORHEXIDINE GLUCONATE 4 % EX LIQD: 60.0000 mL | Freq: Once | CUTANEOUS | Status: DC

## 2021-10-28 MED ORDER — PROPOFOL 500 MG/50ML IV EMUL
INTRAVENOUS | Status: DC | PRN
  Administered 2021-10-28: 150 ug/kg/min via INTRAVENOUS

## 2021-10-28 MED ORDER — 0.9 % SODIUM CHLORIDE (POUR BTL) OPTIME
TOPICAL | Status: DC | PRN
  Administered 2021-10-28: 1000 mL

## 2021-10-28 MED ORDER — PHENYLEPHRINE 80 MCG/ML (10ML) SYRINGE FOR IV PUSH (FOR BLOOD PRESSURE SUPPORT)
PREFILLED_SYRINGE | INTRAVENOUS | Status: AC
  Filled 2021-10-28: qty 10

## 2021-10-28 MED ORDER — LACTATED RINGERS IV SOLN: INTRAVENOUS | Status: DC

## 2021-10-28 MED ORDER — PHENYLEPHRINE 80 MCG/ML (10ML) SYRINGE FOR IV PUSH (FOR BLOOD PRESSURE SUPPORT)
PREFILLED_SYRINGE | INTRAVENOUS | Status: AC
  Filled 2021-10-28: qty 20

## 2021-10-28 MED ORDER — INSULIN ASPART 100 UNIT/ML IJ SOLN: 0.0000 [IU] | INTRAMUSCULAR | Status: DC | PRN

## 2021-10-28 MED ORDER — BACITRACIN ZINC 500 UNIT/GM EX OINT
TOPICAL_OINTMENT | CUTANEOUS | Status: DC | PRN
  Administered 2021-10-28: 1 via TOPICAL

## 2021-10-28 MED ORDER — DEXAMETHASONE SODIUM PHOSPHATE 10 MG/ML IJ SOLN
INTRAMUSCULAR | Status: DC | PRN
  Administered 2021-10-28: 5 mg via INTRAVENOUS

## 2021-10-28 MED ORDER — LIDOCAINE 2% (20 MG/ML) 5 ML SYRINGE
INTRAMUSCULAR | Status: DC | PRN
  Administered 2021-10-28: 60 mg via INTRAVENOUS

## 2021-10-28 MED ORDER — PROPOFOL 10 MG/ML IV BOLUS
INTRAVENOUS | Status: AC
  Filled 2021-10-28: qty 20

## 2021-10-28 MED ORDER — SODIUM CHLORIDE 0.9 % IR SOLN
Status: DC | PRN
  Administered 2021-10-28: 1000 mL

## 2021-10-28 MED ORDER — FENTANYL CITRATE (PF) 250 MCG/5ML IJ SOLN
INTRAMUSCULAR | Status: AC
  Filled 2021-10-28: qty 5

## 2021-10-28 MED ORDER — CEFAZOLIN SODIUM-DEXTROSE 2-4 GM/100ML-% IV SOLN
2.0000 g | INTRAVENOUS | Status: AC
  Administered 2021-10-28: 2 g via INTRAVENOUS
  Filled 2021-10-28: qty 100

## 2021-10-28 MED ORDER — PHENYLEPHRINE 80 MCG/ML (10ML) SYRINGE FOR IV PUSH (FOR BLOOD PRESSURE SUPPORT)
PREFILLED_SYRINGE | INTRAVENOUS | Status: DC | PRN
  Administered 2021-10-28 (×3): 160 ug via INTRAVENOUS
  Administered 2021-10-28 (×2): 80 ug via INTRAVENOUS
  Administered 2021-10-28: 160 ug via INTRAVENOUS
  Administered 2021-10-28: 80 ug via INTRAVENOUS

## 2021-10-28 MED ORDER — BACITRACIN ZINC 500 UNIT/GM EX OINT
TOPICAL_OINTMENT | CUTANEOUS | Status: AC
Start: 2021-10-28 — End: ?
  Filled 2021-10-28: qty 28.35

## 2021-10-28 MED ORDER — VANCOMYCIN HCL 500 MG IV SOLR
INTRAVENOUS | Status: AC
  Filled 2021-10-28: qty 10

## 2021-10-28 MED ORDER — BUPIVACAINE HCL (PF) 0.25 % IJ SOLN
INTRAMUSCULAR | Status: AC
  Filled 2021-10-28: qty 30

## 2021-10-28 MED ORDER — DEXMEDETOMIDINE (PRECEDEX) IN NS 20 MCG/5ML (4 MCG/ML) IV SYRINGE
PREFILLED_SYRINGE | INTRAVENOUS | Status: DC | PRN
  Administered 2021-10-28: 8 ug via INTRAVENOUS

## 2021-10-28 MED ORDER — ONDANSETRON HCL 4 MG/2ML IJ SOLN
INTRAMUSCULAR | Status: DC | PRN
  Administered 2021-10-28: 4 mg via INTRAVENOUS

## 2021-10-28 MED ORDER — CHLORHEXIDINE GLUCONATE 0.12 % MT SOLN
15.0000 mL | Freq: Once | OROMUCOSAL | Status: AC
  Administered 2021-10-28: 15 mL via OROMUCOSAL
  Filled 2021-10-28: qty 15

## 2021-10-28 MED ORDER — MIDAZOLAM HCL 2 MG/2ML IJ SOLN
INTRAMUSCULAR | Status: DC | PRN
  Administered 2021-10-28: 2 mg via INTRAVENOUS

## 2021-10-28 MED ORDER — PROPOFOL 10 MG/ML IV BOLUS
INTRAVENOUS | Status: DC | PRN
  Administered 2021-10-28: 20 mg via INTRAVENOUS

## 2021-10-28 MED ORDER — MIDAZOLAM HCL 2 MG/2ML IJ SOLN
INTRAMUSCULAR | Status: AC
  Filled 2021-10-28: qty 2

## 2021-10-28 MED ORDER — ORAL CARE MOUTH RINSE: 15.0000 mL | Freq: Once | OROMUCOSAL | Status: AC

## 2021-10-28 MED ORDER — VANCOMYCIN HCL 500 MG IV SOLR
INTRAVENOUS | Status: DC | PRN
  Administered 2021-10-28: 500 mg via TOPICAL

## 2021-10-28 MED ORDER — POVIDONE-IODINE 10 % EX SWAB: 2.0000 "application " | Freq: Once | CUTANEOUS | Status: DC

## 2021-10-28 SURGICAL SUPPLY — 71 items
ANCHOR SUT KEITH ABD SZ2 STR (SUTURE) ×1 IMPLANT
BAG COUNTER SPONGE SURGICOUNT (BAG) IMPLANT
BANDAGE ESMARK 6X9 LF (GAUZE/BANDAGES/DRESSINGS) ×1 IMPLANT
BLADE 15 SAFETY STRL DISP (BLADE) ×2 IMPLANT
BLADE LONG MED 31X9 (MISCELLANEOUS) ×1 IMPLANT
BNDG COHESIVE 4X5 TAN ST LF (GAUZE/BANDAGES/DRESSINGS) ×1 IMPLANT
BNDG COHESIVE 6X5 TAN NS LF (GAUZE/BANDAGES/DRESSINGS) ×1 IMPLANT
BNDG COHESIVE 6X5 TAN ST LF (GAUZE/BANDAGES/DRESSINGS) ×1 IMPLANT
BNDG CONFORM 2 STRL LF (GAUZE/BANDAGES/DRESSINGS) ×2 IMPLANT
BNDG ELASTIC 4X5.8 VLCR STR LF (GAUZE/BANDAGES/DRESSINGS) ×3 IMPLANT
BNDG ELASTIC 6X5.8 VLCR STR LF (GAUZE/BANDAGES/DRESSINGS) ×1 IMPLANT
BNDG ESMARK 6X9 LF (GAUZE/BANDAGES/DRESSINGS)
BNDG GAUZE ELAST 4 BULKY (GAUZE/BANDAGES/DRESSINGS) ×3 IMPLANT
CHLORAPREP W/TINT 26 (MISCELLANEOUS) ×4 IMPLANT
COVER SURGICAL LIGHT HANDLE (MISCELLANEOUS) ×3 IMPLANT
CUFF TOURN SGL QUICK 34 (TOURNIQUET CUFF) ×1
CUFF TRNQT CYL 34X4.125X (TOURNIQUET CUFF) ×2 IMPLANT
DECANTER SPIKE VIAL GLASS SM (MISCELLANEOUS) IMPLANT
DRAPE C-ARM 42X120 X-RAY (DRAPES) IMPLANT
DRAPE C-ARM MINI 42X72 WSTRAPS (DRAPES) ×3 IMPLANT
DRAPE C-ARMOR (DRAPES) ×1 IMPLANT
DRAPE EXTREMITY T 121X128X90 (DISPOSABLE) ×1 IMPLANT
DRAPE OEC MINIVIEW 54X84 (DRAPES) ×1 IMPLANT
DRAPE ORTHO SPLIT 77X108 STRL (DRAPES)
DRAPE SURG ORHT 6 SPLT 77X108 (DRAPES) ×2 IMPLANT
DRAPE U-SHAPE 47X51 STRL (DRAPES) ×1 IMPLANT
DRSG ADAPTIC 3X8 NADH LF (GAUZE/BANDAGES/DRESSINGS) ×1 IMPLANT
DRSG EMULSION OIL 3X3 NADH (GAUZE/BANDAGES/DRESSINGS) ×2 IMPLANT
DRSG PAD ABDOMINAL 8X10 ST (GAUZE/BANDAGES/DRESSINGS) ×5 IMPLANT
ELECT REM PT RETURN 15FT ADLT (MISCELLANEOUS) ×1 IMPLANT
FACESHIELD WRAPAROUND (MASK) IMPLANT
FACESHIELD WRAPAROUND OR TEAM (MASK) ×1 IMPLANT
GAUZE PAD ABD 8X10 STRL (GAUZE/BANDAGES/DRESSINGS) ×2 IMPLANT
GAUZE SPONGE 4X4 12PLY STRL (GAUZE/BANDAGES/DRESSINGS) ×4 IMPLANT
GAUZE XEROFORM 5X9 LF (GAUZE/BANDAGES/DRESSINGS) ×1 IMPLANT
GLOVE SURG ENC MOIS LTX SZ7.5 (GLOVE) ×6 IMPLANT
GLOVE SURG MICRO LTX SZ7.5 (GLOVE) ×2 IMPLANT
GLOVE SURG POLYISO LF SZ7.5 (GLOVE) ×1 IMPLANT
GLOVE SURG UNDER POLY LF SZ7.5 (GLOVE) ×6 IMPLANT
GOWN STRL REUS W/ TWL LRG LVL3 (GOWN DISPOSABLE) ×2 IMPLANT
GOWN STRL REUS W/TWL LRG LVL3 (GOWN DISPOSABLE) ×1
KIT BASIN OR (CUSTOM PROCEDURE TRAY) ×3 IMPLANT
KIT TURNOVER KIT A (KITS) IMPLANT
NDL MAYO CATGUT SZ4 TPR NDL (NEEDLE) IMPLANT
NEEDLE HYPO 22GX1.5 SAFETY (NEEDLE) ×3 IMPLANT
NEEDLE MAYO CATGUT SZ4 (NEEDLE) IMPLANT
NS IRRIG 1000ML POUR BTL (IV SOLUTION) ×3 IMPLANT
PACK ORTHO EXTREMITY (CUSTOM PROCEDURE TRAY) ×3 IMPLANT
PAD CAST 4YDX4 CTTN HI CHSV (CAST SUPPLIES) ×6 IMPLANT
PADDING CAST COTTON 4X4 STRL (CAST SUPPLIES)
PADDING CAST COTTON 6X4 STRL (CAST SUPPLIES) ×1 IMPLANT
PROTECTOR NERVE ULNAR (MISCELLANEOUS) ×1 IMPLANT
SPONGE T-LAP 18X18 ~~LOC~~+RFID (SPONGE) ×1 IMPLANT
STAPLER VISISTAT 35W (STAPLE) ×1 IMPLANT
STOCKINETTE TUBULAR 6 INCH (GAUZE/BANDAGES/DRESSINGS) ×1 IMPLANT
STOCKINETTE TUBULAR COTT 4X25 (GAUZE/BANDAGES/DRESSINGS) ×3 IMPLANT
STRIP CLOSURE SKIN 1/2X4 (GAUZE/BANDAGES/DRESSINGS) ×1 IMPLANT
SUCTION FRAZIER HANDLE 10FR (MISCELLANEOUS)
SUCTION TUBE FRAZIER 10FR DISP (MISCELLANEOUS) ×1 IMPLANT
SUT ETHILON 3 0 PS 1 (SUTURE) ×1 IMPLANT
SUT MON AB 3-0 SH 27 (SUTURE)
SUT MON AB 3-0 SH27 (SUTURE) ×1 IMPLANT
SUT PLAIN 4 0 FS 2 27 (SUTURE) ×2 IMPLANT
SUT PLAIN 5 0 P 3 18 (SUTURE) ×4 IMPLANT
SUT PROLENE 3 0 PS 1 (SUTURE) ×2 IMPLANT
SUT VIC AB 2-0 SH 27 (SUTURE)
SUT VIC AB 2-0 SH 27XBRD (SUTURE) ×1 IMPLANT
SUT VIC AB 3-0 SH 27 (SUTURE)
SUT VIC AB 3-0 SH 27X BRD (SUTURE) ×2 IMPLANT
SYR CONTROL 10ML LL (SYRINGE) ×3 IMPLANT
WATER STERILE IRR 1000ML POUR (IV SOLUTION) ×1 IMPLANT

## 2021-10-28 NOTE — H&P (Addendum)
H&P Update:  -History and Physical Reviewed  -Patient has been re-examined  -No change in the plan of care  -The risks and benefits were presented and reviewed. The risks due to nail dystrophy and/or loss, chronic pain, hardware failure/irritation, new/persistent infection, stiffness, nerve/vessel/tendon injury, nonunion/malunion, wound healing issues, development of arthritis, failure of this surgery, possibility of external fixation with delayed definitive surgery, need for further surgery, thromboembolic events, anesthesia/medical complications, amputation, death among others were discussed. The patient acknowledged the explanation, agreed to proceed with the plan and a consent was signed.  Rose [name]

## 2021-10-28 NOTE — Transfer of Care (Signed)
Immediate Anesthesia Transfer of Care Note  Patient: Rose [name]  Procedure(s) Performed: IRRIGATION AND DEBRIDEMENT EXTREMITY, GREAT TOE (Right: Toe)  Patient Location: PACU  Anesthesia Type:MAC  Level of Consciousness: awake, alert  and oriented  Airway & Oxygen Therapy: Patient Spontanous Breathing  Post-op Assessment: Report given to RN, Post -op Vital signs reviewed and stable and Patient moving all extremities X 4  Post vital signs: Reviewed and stable  Last Vitals:  Vitals Value Taken Time  BP 126/72 10/28/21 1357  Temp    Pulse 79 10/28/21 1359  Resp 16 10/28/21 1358  SpO2 98 % 10/28/21 1359  Vitals shown include unvalidated device data.  Last Pain:  Vitals:   10/28/21 1330  PainSc: 0-No pain      Patients Stated Pain Goal: 2 (23/55/73 2202)  Complications: No notable events documented.

## 2021-10-28 NOTE — Op Note (Signed)
10/28/2021  9:29 PM   PATIENT: Rose [name]  66 y.o. female  MRN: [id number]   PRE-OPERATIVE DIAGNOSIS:   Open fracture of the right great toe distal phalanx   POST-OPERATIVE DIAGNOSIS:   Same   PROCEDURE: 1) Right great toe irrigation and debridement 2) Open reduction of right great toe distal phalanx fracture 3) Right great toe nail bed repair   SURGEON:  Netta Cedars, MD   ASSISTANT: None   ANESTHESIA: General, local   EBL: Minimal   TOURNIQUET:   None used   COMPLICATIONS: None apparent   DISPOSITION: Extubated, awake and stable to recovery.   INDICATION FOR PROCEDURE: The patient presented with open fracture of the right great toe distal phalanx. She presented in delayed manner to urgent care. She was placed on antibiotics and followed up in orthopaedic clinic for definitive management. Of note, the patient is an active daily smoker and diabetic.  We discussed the diagnosis, alternative treatment options, risks and benefits of the above surgical intervention, as well as alternative non-operative treatments. All questions/concerns were addressed and the patient/family demonstrated appropriate understanding of the diagnosis, the procedure, the postoperative course, and overall prognosis. The patient wished to proceed with surgical intervention and signed an informed surgical consent as such, in each others presence prior to surgery.   PROCEDURE IN DETAIL: After preoperative consent was obtained and the correct operative site was identified, the patient was brought to the operating room supine on stretcher and transferred onto operating table. General anesthesia was induced. Preoperative antibiotics were administered. Surgical timeout was taken. The patient was then positioned supine with an ipsilateral hip bump. The operative lower extremity was prepped and draped in standard sterile fashion. No tourniquet was used. Local anesthesia using 0.25% plain  marcaine was injected around the 1st metatarsal as discussed with anesthesia.  The right great toe was carefully examined and confirmed to have open fracture of the distal phalanx. The great toe nail was partially avulsed. This nail was removed to allow full examination of the nailbed and fracture reduction. The nail bed soft tissue was noted to be interposed at the fracture site blocking reduction. This tissue was gently removed from the fracture site allowing for reduction of this fracture. The fracture site was thoroughly irrigated with saline and betadine solution was used as well. The nail bed was repaired with absorbable 5-0 catgut suture.The nail was secured back into the nail fold using prolene suture to anchor it down to the toe. We also repaired lacerations adjacent to the medial and lateral portions of the nail using prolene.    The leg was cleaned with saline and sterile adaptic dressings with gauze were applied. A well padded loosely compressive wrap was applied. The patient was awakened from anesthesia and transported to the recovery room in stable condition.    FOLLOW UP PLAN: -transfer to PACU, then home -heel weightbearing operative extremity in postop shoe, maximum elevation -maintain short leg splint until follow up -DVT ppx: Aspirin 81 mg twice daily until full ambulation -Keflex 500 mg TID x 7 days postop -follow up as outpatient in 7 days for wound check   RADIOGRAPHS: AP, lateral and oblique radiographs of the right foot were obtained intraoperatively. These showed interval reduction of the fracture. No other acute injuries are noted.   Netta Cedars Orthopaedic Surgery [facility]

## 2021-10-28 NOTE — Anesthesia Postprocedure Evaluation (Signed)
Anesthesia Post Note  Patient: Rose [name]  Procedure(s) Performed: IRRIGATION AND DEBRIDEMENT EXTREMITY, GREAT TOE (Right: Toe)     Patient location during evaluation: PACU Anesthesia Type: MAC Level of consciousness: awake and alert Pain management: pain level controlled Vital Signs Assessment: post-procedure vital signs reviewed and stable Respiratory status: spontaneous breathing, nonlabored ventilation, respiratory function stable and patient connected to nasal cannula oxygen Cardiovascular status: stable and blood pressure returned to baseline Postop Assessment: no apparent nausea or vomiting Anesthetic complications: no   No notable events documented.  Last Vitals:  Vitals:   10/28/21 1330 10/28/21 1400  BP: 107/70   Pulse: 75   Resp: 13   Temp: 36.4 C (!) 36.1 C  SpO2: 96%     Last Pain:  Vitals:   10/28/21 1400  PainSc: 0-No pain                 Effie Berkshire

## 2021-10-28 NOTE — Anesthesia Procedure Notes (Signed)
Procedure Name: MAC Date/Time: 10/28/2021 12:33 PM Performed by: Rande Brunt, CRNA Pre-anesthesia Checklist: Emergency Drugs available, Patient identified, Suction available and Patient being monitored Oxygen Delivery Method: Simple face mask Preoxygenation: Pre-oxygenation with 100% oxygen Induction Type: IV induction Placement Confirmation: positive ETCO2 and CO2 detector Dental Injury: Teeth and Oropharynx as per pre-operative assessment

## 2021-10-28 NOTE — Discharge Instructions (Addendum)
Rose Cedars, MD [facility]  Please read the following information regarding your care after surgery.  Medications  You only need a prescription for the narcotic pain medicine (ex. oxycodone, Percocet, Norco).  All of the other medicines listed below are available over the counter. ? Aleve 2 pills twice a day for the first 3 days after surgery. ? acetominophen (Tylenol) 650 mg every 4-6 hours as you need for minor to moderate pain ? oxycodone as prescribed for severe pain  ? To help prevent blood clots, take aspirin (81 mg) twice daily for 14 days after surgery.  You should also get up every hour while you are awake to move around.  ? To prevent against infection, take keflex 500 mg three times daily for 7 days after surgery.   Weight Bearing ? Heel weight bearing in postop shoe on the operated leg or foot.  Cast / Splint / Dressing ? If you have a splint, do NOT remove this. Keep your splint, cast or dressing clean and dry.  Don't put anything (coat hanger, pencil, etc) down inside of it.  If it gets wet, call the office immediately to schedule an appointment for a cast change.  Swelling IMPORTANT: It is normal for you to have swelling where you had surgery. To reduce swelling and pain, keep at least 3 pillows under your leg so that your toes are above your nose and your heel is above the level of your hip.  It may be necessary to keep your foot or leg elevated for several weeks.  This is critical to helping your incisions heal and your pain to feel better.  Follow Up Call my office at [phone] when you are discharged from the hospital or surgery center to schedule an appointment to be seen 7 days after surgery.  Call my office at [phone] if you develop a fever >101.5 F, nausea, vomiting, bleeding from the surgical site or severe pain.

## 2021-10-31 ENCOUNTER — Encounter (HOSPITAL_COMMUNITY): Payer: Self-pay | Admitting: Orthopaedic Surgery

## 2022-01-20 ENCOUNTER — Ambulatory Visit
Admission: RE | Admit: 2022-01-20 | Discharge: 2022-01-20 | Disposition: A | Discharge status: Home / Self Care | Payer: 59 | Source: Ambulatory Visit | Attending: Family Medicine | Admitting: Family Medicine

## 2022-01-20 DIAGNOSIS — R590 Localized enlarged lymph nodes: Secondary | ICD-10-CM

## 2022-02-14 ENCOUNTER — Other Ambulatory Visit: Payer: Self-pay | Admitting: Family Medicine

## 2022-02-14 DIAGNOSIS — R2241 Localized swelling, mass and lump, right lower limb: Secondary | ICD-10-CM

## 2022-02-23 ENCOUNTER — Ambulatory Visit: Payer: 59 | Admitting: Podiatry

## 2022-03-01 ENCOUNTER — Encounter: Payer: Self-pay | Admitting: Podiatry

## 2022-03-01 ENCOUNTER — Ambulatory Visit (INDEPENDENT_AMBULATORY_CARE_PROVIDER_SITE_OTHER): Payer: 59

## 2022-03-01 ENCOUNTER — Ambulatory Visit (INDEPENDENT_AMBULATORY_CARE_PROVIDER_SITE_OTHER): Payer: 59 | Admitting: Podiatry

## 2022-03-01 DIAGNOSIS — M778 Other enthesopathies, not elsewhere classified: Secondary | ICD-10-CM | POA: Diagnosis not present

## 2022-03-01 DIAGNOSIS — L600 Ingrowing nail: Secondary | ICD-10-CM | POA: Diagnosis not present

## 2022-03-01 NOTE — Patient Instructions (Signed)

## 2022-03-02 NOTE — Progress Notes (Signed)
Subjective:   Patient ID: Rose [name], female   DOB: 66 y.o.   MRN: [id number]   HPI Patient presents stating her right big toenail has really started to bother her and she did traumatize it several months ago and it simply is not acting correct   ROS      Objective:  Physical Exam  Neuro vascular status intact with some irritation of the right hallux but the pain now is centered more on the nailbed which is partially detached and discolored and thickened     Assessment:  Damaged right hallux nail secondary to trauma     Plan:  We have reviewed condition recommended temporary nail removal allowing a new nail to regrow and hopefully to grow normally and ultimately may require permanent correction which I explained to her.  Today I anesthetized the right hallux 60 mg like Marcaine mixture sterile prep remove the hallux nail flush the bed applied sterile dressing and reappoint for [place] to recheck

## 2022-03-14 ENCOUNTER — Emergency Department (HOSPITAL_COMMUNITY)
Admission: EM | Admit: 2022-03-14 | Discharge: 2022-03-14 | Disposition: A | Discharge status: Home / Self Care | Payer: 59 | Attending: Emergency Medicine | Admitting: Emergency Medicine

## 2022-03-14 ENCOUNTER — Other Ambulatory Visit: Payer: Self-pay

## 2022-03-14 ENCOUNTER — Emergency Department (HOSPITAL_COMMUNITY): Payer: 59

## 2022-03-14 DIAGNOSIS — S0990XA Unspecified injury of head, initial encounter: Secondary | ICD-10-CM | POA: Insufficient documentation

## 2022-03-14 DIAGNOSIS — W06XXXA Fall from bed, initial encounter: Secondary | ICD-10-CM | POA: Diagnosis not present

## 2022-03-14 DIAGNOSIS — G8929 Other chronic pain: Secondary | ICD-10-CM | POA: Insufficient documentation

## 2022-03-14 DIAGNOSIS — W19XXXA Unspecified fall, initial encounter: Secondary | ICD-10-CM

## 2022-03-14 DIAGNOSIS — M546 Pain in thoracic spine: Secondary | ICD-10-CM | POA: Insufficient documentation

## 2022-03-14 MED ORDER — ONDANSETRON 4 MG PO TBDP: 4.0000 mg | ORAL_TABLET | Freq: Three times a day (TID) | ORAL | 0 refills | Status: DC | PRN

## 2022-03-14 MED ORDER — METHOCARBAMOL 500 MG PO TABS: 500.0000 mg | ORAL_TABLET | Freq: Three times a day (TID) | ORAL | 0 refills | Status: DC | PRN

## 2022-03-14 NOTE — ED Provider Notes (Signed)
[facility] EMERGENCY DEPARTMENT Provider Note   CSN: [id number] Arrival date & time: 03/14/22  1817     History  Chief Complaint  Patient presents with   Rose [name] is a 66 y.o. female.   Fall  Patient presents after fall 3 days ago.  Reportedly fell out of bed and hit the back of her head.  Reportedly pain in her upper back also.  States there was some pain there however before the fall.  States she has had some nausea and blurred vision since.  Has had back pain and states her primary care doctor cannot find a clear cause.  No difficulty breathing.  No abdominal pain.  Patient states Tylenol is not been helping with the pain.    Past Medical History:  Diagnosis Date   Allergy    Anemia    Anxiety    Arthritis    Asthma    Back pain    Chronic headaches    Class 1 obesity due to excess calories with serious comorbidity in adult    Complication of anesthesia    slow to wake up   COPD (chronic obstructive pulmonary disease) (HCC)    Diabetes (HCC)    Fatty liver    GERD (gastroesophageal reflux disease)    Headache    Herniated lumbar intervertebral disc    High cholesterol    History of blood clots    Lumbago with sciatica, unspecified side    Mitral stenosis    severe by 05/18/21 echo   Moderate episode of recurrent major depressive disorder (HCC)    Nicotine dependence, cigarettes, with other nicotine-induced disorders    Nonrheumatic aortic (valve) insufficiency    Other chronic pain    Pain in joint involving ankle and foot    Pancreatitis    Pneumonia    Presence urogenital implant    Sleep apnea    no cpap   Stroke ([place])    mild   SUI (stress urinary incontinence, female)    Type 2 diabetes mellitus with hyperglycemia, with long-term current use of insulin ([place])     Home Medications Prior to Admission medications   Medication Sig Start Date End Date Taking? Authorizing Provider  albuterol (VENTOLIN HFA) 108 (90  Base) MCG/ACT inhaler inhale 2 PUFFS into THE lungs EVERY 6 HOURS AS NEEDED FOR WHEEZING OR SHORTNESS OF BREATH Patient taking differently: Inhale 2 puffs into the lungs every 6 (six) hours as needed for shortness of breath or wheezing. 10/28/21  Yes Collene Gobble, MD  aspirin EC 81 MG tablet Take 1 tablet (81 mg total) by mouth daily. Swallow whole. 05/06/21  Yes Tolia, Sunit, DO  Budeson-Glycopyrrol-Formoterol (BREZTRI AEROSPHERE) 160-9-4.8 MCG/ACT AERO Inhale 2 puffs into the lungs 2 (two) times daily.   Yes [provider]  clonazePAM (KLONOPIN) 1 MG tablet Take 1 mg by mouth in the morning, at noon, and at bedtime.   Yes [provider]  clotrimazole-betamethasone (LOTRISONE) cream Apply 1 application topically 2 (two) times daily.   Yes [provider]  gabapentin (NEURONTIN) 600 MG tablet Take 600 mg by mouth 3 (three) times daily as needed (muscle spasms). 03/05/19  Yes [provider]  metFORMIN (GLUCOPHAGE) 500 MG tablet Take 500 mg by mouth 2 (two) times daily. 03/05/19  Yes [provider]  methocarbamol (ROBAXIN) 500 MG tablet Take 1 tablet (500 mg total) by mouth every 8 (eight) hours as needed for muscle spasms. 03/14/22  Yes Benjiman Core, MD  Multiple Vitamins-Minerals (CENTRUM ADULTS PO) Take 1 tablet by mouth daily.   Yes [provider]  naproxen sodium (ALEVE) 220 MG tablet Take 220 mg by mouth daily as needed (pain).   Yes [provider]  omeprazole (PRILOSEC) 40 MG capsule Take 40 mg by mouth daily. 01/23/22  Yes [provider]  ondansetron (ZOFRAN-ODT) 4 MG disintegrating tablet Take 1 tablet (4 mg total) by mouth every 8 (eight) hours as needed for nausea or vomiting. 03/14/22  Yes Benjiman Core, MD  rosuvastatin (CRESTOR) 40 MG tablet Take 1 tablet (40 mg total) by mouth at bedtime. 05/06/21 03/14/22 Yes Tolia, Sunit, DO  traZODone (DESYREL) 150 MG tablet Take 50 mg by mouth at bedtime. 02/28/21  Yes  [provider]  pantoprazole (PROTONIX) 40 MG tablet Take 40 mg by mouth daily. Patient not taking: Reported on 03/14/2022    [provider]      Allergies    Erythromycin, Nickel, Oxycodone, Meperidine, Codeine, Hydrocodone-acetaminophen, Morphine, Propoxyphene, and Quetiapine    Review of Systems   Review of Systems  Physical Exam Updated Vital Signs BP 131/65   Pulse 74   Temp 97.8 F (36.6 C) (Oral)   Resp 17   Ht 5\' 3"  (1.6 m)   Wt 86.2 kg   SpO2 96%   BMI 33.66 kg/m  Physical Exam Vitals and nursing note reviewed.  HENT:     Head:     Comments: Tenderness to occipital area. Eyes:     Pupils: Pupils are equal, round, and reactive to light.  Cardiovascular:     Rate and Rhythm: Regular rhythm.  Chest:     Chest wall: No tenderness.  Abdominal:     Tenderness: There is no abdominal tenderness.  Musculoskeletal:     Cervical back: Neck supple. No tenderness.     Comments: Some tenderness upper thoracic spine and to lesser extent the back more laterally.  No crepitus deformity.  No clear ecchymosis.  Mild pain with movement at the right shoulder without deformity or point tenderness.  Skin:    Capillary Refill: Capillary refill takes less than 2 seconds.  Neurological:     Mental Status: She is alert and oriented to person, place, and time.     ED Results / Procedures / Treatments   Labs (all labs ordered are listed, but only abnormal results are displayed) Labs Reviewed - No data to display  EKG None  Radiology CT HEAD WO CONTRAST ( )  Result Date: 03/14/2022 CLINICAL DATA:  Head trauma, minor (Age >= 65y) EXAM: CT HEAD WITHOUT CONTRAST TECHNIQUE: Contiguous axial images were obtained from the base of the skull through the vertex without intravenous contrast. RADIATION DOSE REDUCTION: This exam was performed according to the departmental dose-optimization program which includes automated exposure control, adjustment of the mA and/or kV  according to patient size and/or use of iterative reconstruction technique. COMPARISON:  None Available. FINDINGS: Brain: No acute intracranial abnormality. Specifically, no hemorrhage, hydrocephalus, mass lesion, acute infarction, or significant intracranial injury. Vascular: No hyperdense vessel or unexpected calcification. Skull: No acute calvarial abnormality. Sinuses/Orbits: No acute findings Other: None IMPRESSION: Normal study. Electronically Signed   By: 03/16/2022 M.D.   On: 03/14/2022 19:47   DG Thoracic Spine 2 View  Result Date: 03/14/2022 CLINICAL DATA:  Fall EXAM: THORACIC SPINE 2 VIEWS COMPARISON:  03/02/2019 FINDINGS: There is no evidence of thoracic spine fracture. Alignment is normal. No other significant bone abnormalities are identified.  IMPRESSION: Negative. Electronically Signed   By: Charlett Nose M.D.   On: 03/14/2022 19:46   DG Chest 2 View  Result Date: 03/14/2022 CLINICAL DATA:  Fall out of bed EXAM: CHEST - 2 VIEW COMPARISON:  06/23/2021 FINDINGS: Heart and mediastinal contours are within normal limits. No focal opacities or effusions. No acute bony abnormality. IMPRESSION: No active cardiopulmonary disease. Electronically Signed   By: Charlett Nose M.D.   On: 03/14/2022 19:45    Procedures Procedures    Medications Ordered in ED Medications - No data to display  ED Course/ Medical Decision Making/ A&P                           Medical Decision Making Amount and/or Complexity of Data Reviewed Radiology: ordered.  Risk Prescription drug management.   Patient with fall.  Larey Seat out of bed.  Since then pain in the back of her head and back.  No relief with Tylenol.  Has had some nausea since.  Potentially concussion but also potentially intracranial hemorrhage.  Will get head CT to evaluate but also will get thoracic spine and chest x-ray.  I reviewed previous CTAs and did not show aneurysm in the chest at that time.  Head CT reassuring with out hemorrhage.   Potentially is still head injury however.  Also with back pain.  Mid back.  Likely worsened with fall but states has been hurting prior.  Doubt aortic dissection.  Doubt cardiac cause.  Appears stable for discharge and will add a muscle relaxer with outpatient follow-up.        Final Clinical Impression(s) / ED Diagnoses Final diagnoses:  Fall, initial encounter  Injury of head, initial encounter  Chronic thoracic back pain, unspecified back pain laterality    Rx / DC Orders ED Discharge Orders          Ordered    ondansetron (ZOFRAN-ODT) 4 MG disintegrating tablet  Every 8 hours PRN        03/14/22 2114    methocarbamol (ROBAXIN) 500 MG tablet  Every 8 hours PRN        03/14/22 2114              Benjiman Core, MD 03/14/22 2246

## 2022-03-14 NOTE — ED Notes (Signed)
Patient transported to CT 

## 2022-03-14 NOTE — ED Notes (Signed)
Sandwich and drink given to pt 

## 2022-03-14 NOTE — ED Triage Notes (Signed)
Pt BIB EMS from home. Pt fell out of bed on Saturday. Pt has a knot to the back of the head. Pt reports loss of consciousness with unknown time. Pt reports vision changes/blurry vision and nausea yesterday. Pupils equal and reactive per EMS. Pt reports generalized upper back pain. No blood thinners.   EMS Vitals 126/64 88 HR 97% RA CBG 179

## 2022-03-21 ENCOUNTER — Telehealth: Payer: Self-pay | Admitting: Internal Medicine

## 2022-03-21 ENCOUNTER — Other Ambulatory Visit: Payer: 59

## 2022-03-21 NOTE — Telephone Encounter (Signed)
Good Afternoon Dr. Hilarie Fredrickson,  Supervising MD 9/19 AM  We received a referral for this patient to be seen for an EGD. The patient has GI hx at [place]. She states she does not have anyone to drive her to [facility] to be seen by them as she lives in [place].   Records are for review in Epic, Care Everywhere. Please advise on scheduling. Thank you.

## 2022-03-24 NOTE — Telephone Encounter (Signed)
Patient has a long established GI provider and due to availability and demand at this time:  Request received to transfer GI care from outside practice to [facility].  We appreciate the interest in our practice, however at this time due to high demand from patients without established GI providers we cannot accommodate this transfer.  Ability to accommodate future transfer requests may change over time.

## 2022-03-27 NOTE — Telephone Encounter (Signed)
Patient has been made aware of decision.  

## 2022-03-31 ENCOUNTER — Other Ambulatory Visit: Payer: 59

## 2022-04-17 ENCOUNTER — Other Ambulatory Visit: Payer: 59

## 2022-06-23 ENCOUNTER — Ambulatory Visit: Payer: 59 | Admitting: Family Medicine

## 2022-07-04 ENCOUNTER — Ambulatory Visit: Payer: Self-pay | Admitting: *Deleted

## 2022-07-04 NOTE — Telephone Encounter (Signed)
Pt would like to discuss possible medications she can take for uti   Pt requests that a nurse return her call to discuss possible medications she can take for uti. Cb# [phone]      Chief Complaint: UTI Symptoms Symptoms: Frequency, urgency, back pain, urine malodorous,cloudy, dysuria. Subjective fever Frequency: A month Pertinent Negatives: Patient denies  Disposition: [] ED /[x] Urgent Care (no appt availability in office) / [] Appointment(In office/virtual)/ []  [place] [facility]/ [] Home Care/ [] Refused Recommended Disposition /[] [facility]/ []  Follow-up with PCP Additional Notes:  Pt directed to UC, states will follow disposition. Care advise provided, verbalizes understanding.  Reason for Disposition  Side (flank) or lower back pain present  Answer Assessment - Initial Assessment Questions 1. SYMPTOM: "What's the main symptom you're concerned about?" (e.g., frequency, incontinence)     Frequency urgency 2. ONSET: "When did the    start?"     Month ago 3. PAIN: "Is there any pain?" If Yes, ask: "How bad is it?" (Scale: 1-10; mild, moderate, severe)     yes 4. CAUSE: "What do you think is causing the symptoms?"     UTI 5. OTHER SYMPTOMS: "Do you have any other symptoms?" (e.g., blood in urine, fever, flank pain, pain with urination)     Back pain, malodorous, cloudy  Protocols used: Urinary Symptoms-A-AH

## 2022-08-07 ENCOUNTER — Ambulatory Visit (INDEPENDENT_AMBULATORY_CARE_PROVIDER_SITE_OTHER): Payer: 59 | Admitting: Primary Care

## 2022-08-24 ENCOUNTER — Other Ambulatory Visit: Payer: Self-pay | Admitting: Family Medicine

## 2022-08-24 DIAGNOSIS — F17210 Nicotine dependence, cigarettes, uncomplicated: Secondary | ICD-10-CM

## 2022-08-24 DIAGNOSIS — E2839 Other primary ovarian failure: Secondary | ICD-10-CM

## 2022-08-25 ENCOUNTER — Other Ambulatory Visit: Payer: Self-pay | Admitting: Family Medicine

## 2022-08-25 DIAGNOSIS — K116 Mucocele of salivary gland: Secondary | ICD-10-CM

## 2022-09-04 ENCOUNTER — Other Ambulatory Visit: Payer: 59

## 2022-09-21 ENCOUNTER — Ambulatory Visit
Admission: RE | Admit: 2022-09-21 | Discharge: 2022-09-21 | Disposition: A | Discharge status: Home / Self Care | Payer: 59 | Source: Ambulatory Visit | Attending: Family Medicine | Admitting: Family Medicine

## 2022-09-21 DIAGNOSIS — R2241 Localized swelling, mass and lump, right lower limb: Secondary | ICD-10-CM

## 2022-09-21 DIAGNOSIS — K116 Mucocele of salivary gland: Secondary | ICD-10-CM

## 2022-09-26 ENCOUNTER — Ambulatory Visit
Admission: RE | Admit: 2022-09-26 | Discharge: 2022-09-26 | Disposition: A | Discharge status: Home / Self Care | Payer: 59 | Source: Ambulatory Visit | Attending: Family Medicine | Admitting: Family Medicine

## 2022-09-26 DIAGNOSIS — F17210 Nicotine dependence, cigarettes, uncomplicated: Secondary | ICD-10-CM

## 2022-10-18 ENCOUNTER — Ambulatory Visit: Payer: 59 | Admitting: Cardiology

## 2022-10-27 ENCOUNTER — Encounter: Payer: Self-pay | Admitting: Cardiology

## 2022-10-27 ENCOUNTER — Ambulatory Visit: Payer: 59 | Attending: Cardiology | Admitting: Cardiology

## 2022-10-27 VITALS — BP 118/64 | HR 88 | Ht 63.0 in | Wt 181.8 lb

## 2022-10-27 DIAGNOSIS — E785 Hyperlipidemia, unspecified: Secondary | ICD-10-CM

## 2022-10-27 DIAGNOSIS — I2510 Atherosclerotic heart disease of native coronary artery without angina pectoris: Secondary | ICD-10-CM | POA: Diagnosis not present

## 2022-10-27 DIAGNOSIS — I059 Rheumatic mitral valve disease, unspecified: Secondary | ICD-10-CM

## 2022-10-27 DIAGNOSIS — I700 Atherosclerosis of aorta: Secondary | ICD-10-CM | POA: Diagnosis not present

## 2022-10-27 DIAGNOSIS — Z7984 Long term (current) use of oral hypoglycemic drugs: Secondary | ICD-10-CM

## 2022-10-27 DIAGNOSIS — E1169 Type 2 diabetes mellitus with other specified complication: Secondary | ICD-10-CM | POA: Diagnosis not present

## 2022-10-27 DIAGNOSIS — E1159 Type 2 diabetes mellitus with other circulatory complications: Secondary | ICD-10-CM

## 2022-10-27 DIAGNOSIS — Z01812 Encounter for preprocedural laboratory examination: Secondary | ICD-10-CM

## 2022-10-27 MED ORDER — ROSUVASTATIN CALCIUM 40 MG PO TABS: 40.0000 mg | ORAL_TABLET | Freq: Every day | ORAL | 3 refills | Status: DC

## 2022-10-27 NOTE — H&P (View-Only) (Signed)
Cardiology Office Note:    Date:  10/27/2022   ID:  Rose [name], DOB 1956-05-16, MRN [id number]  PCP:  Ellyn Hack, MD   [facility] Providers Cardiologist:  None     Referring MD: Ellyn Hack, MD    History of Present Illness:    Rose [name] is a 67 y.o. female here for the evaluation of mitral stenosis at the request of Dr. Sherryll Burger.  Echocardiogram from 05/18/2021 showed moderate to severe mitral stenosis with mean peak gradient of 12 mmHg with valve area of 0.9 mm by pressure half-time method.  Rheumatic heart disease was considered.  Pulmonary artery systolic pressure was 48 mmHg.  Nuclear stress test in 2022 showed normal perfusion.  Increased RV uptake suggested RVH.  PE protocol CT in January 2023 showed no PE.  Positive aortic atherosclerosis with enlarged left atrium.  Has HA, fibromyagia. COPD. Prior dilation of esophagus naybe 7 years ago. Eat anything diarrhea. Yogurt only.   Past Medical History:  Diagnosis Date   Allergy    Anemia    Anxiety    Arthritis    Asthma    Back pain    CAD (coronary artery disease)    Chronic headaches    Class 1 obesity due to excess calories with serious comorbidity in adult    Complication of anesthesia    slow to wake up   COPD (chronic obstructive pulmonary disease) (HCC)    Diabetes (HCC)    Fatty liver    GERD (gastroesophageal reflux disease)    Headache    Herniated lumbar intervertebral disc    High cholesterol    History of blood clots    Lumbago with sciatica, unspecified side    Mitral stenosis    severe by 05/18/21 echo   Moderate episode of recurrent major depressive disorder (HCC)    Nicotine dependence, cigarettes, with other nicotine-induced disorders    Nonrheumatic aortic (valve) insufficiency    Other chronic pain    Pain in joint involving ankle and foot    Pancreatitis    Pneumonia    Presence urogenital implant    Sleep apnea    no cpap   Stroke (HCC)    mild   SUI  (stress urinary incontinence, female)    Type 2 diabetes mellitus with hyperglycemia, with long-term current use of insulin (HCC)     Past Surgical History:  Procedure Laterality Date   ANKLE SURGERY     x 3   Bladder tack     BREAST REDUCTION SURGERY     CHOLECYSTECTOMY  1998   HERNIA REPAIR     I & D EXTREMITY Right 10/28/2021   Procedure: IRRIGATION AND DEBRIDEMENT EXTREMITY, GREAT TOE;  Surgeon: Netta Cedars, MD;  Location: [facility] OR;  Service: Orthopedics;  Laterality: Right;   VAGINAL HYSTERECTOMY  1988    Current Medications: Current Meds  Medication Sig   albuterol (VENTOLIN HFA) 108 (90 Base) MCG/ACT inhaler inhale 2 PUFFS into THE lungs EVERY 6 HOURS AS NEEDED FOR WHEEZING OR SHORTNESS OF BREATH (Patient taking differently: Inhale 2 puffs into the lungs every 6 (six) hours as needed for shortness of breath or wheezing.)   aspirin EC 81 MG tablet Take 1 tablet (81 mg total) by mouth daily. Swallow whole.   clonazePAM (KLONOPIN) 1 MG tablet Take 1 mg by mouth in the morning, at noon, and at bedtime.   clotrimazole-betamethasone (LOTRISONE) cream Apply 1 application topically 2 (two) times daily.  gabapentin (NEURONTIN) 600 MG tablet Take 600 mg by mouth 3 (three) times daily as needed (muscle spasms).   metFORMIN (GLUCOPHAGE) 500 MG tablet Take 500 mg by mouth 2 (two) times daily.   methocarbamol (ROBAXIN) 500 MG tablet Take 1 tablet (500 mg total) by mouth every 8 (eight) hours as needed for muscle spasms.   Multiple Vitamins-Minerals (CENTRUM ADULTS PO) Take 1 tablet by mouth daily.   omeprazole (PRILOSEC) 40 MG capsule Take 40 mg by mouth daily.   ondansetron (ZOFRAN-ODT) 4 MG disintegrating tablet Take 1 tablet (4 mg total) by mouth every 8 (eight) hours as needed for nausea or vomiting.   traZODone (DESYREL) 50 MG tablet Take 75 mg by mouth at bedtime. Pt takes 1and 1/2 tablet at bedtime.     Allergies:   Erythromycin, Nickel, Oxycodone, Meperidine, Codeine,  Hydrocodone-acetaminophen, Morphine, Propoxyphene, and Quetiapine   Social History   Socioeconomic History   Marital status: Single    Spouse name: Not on file   Number of children: Not on file   Years of education: Not on file   Highest education level: Not on file  Occupational History   Not on file  Tobacco Use   Smoking status: Some Days    Packs/day: 0.50    Years: 47.00    Additional pack years: 0.00    Total pack years: 23.50    Types: Cigarettes   Smokeless tobacco: Never   Tobacco comments:    0.5 packs smoked daily. ARJ 03/29/21  Vaping Use   Vaping Use: Former   Substances: Nicotine, Flavoring  Substance and Sexual Activity   Alcohol use: No    Alcohol/week: 0.0 standard drinks of alcohol   Drug use: Not Currently    Types: Marijuana   Sexual activity: Not Currently  Other Topics Concern   Not on file  Social History Narrative   Not on file   Social Determinants of Health   Financial Resource Strain: Not on file  Food Insecurity: Not on file  Transportation Needs: Not on file  Physical Activity: Not on file  Stress: Not on file  Social Connections: Not on file     Family History: The patient's family history includes Cancer in her mother and sister; Emphysema in her mother; Heart disease in her father and mother.  ROS:   Please see the history of present illness.     All other systems reviewed and are negative.  EKGs/Labs/Other Studies Reviewed:    The following studies were reviewed today: Cardiac Studies & Procedures       ECHOCARDIOGRAM  PCV ECHOCARDIOGRAM COMPLETE 05/21/2021  Narrative Echocardiogram 05/18/2021: Normal LV systolic function with visual EF 50-55%. Left ventricle cavity is normal in size. Mild concentric hypertrophy of the left ventricle. Normal global wall motion. Diastolic function not assessed due to severity of mitral stenosis and regurgitation. Left atrial cavity is severely dilated at 56 cc/m2. Structurally normal  trileaflet aortic valve with race aortic valve stenosis. No regurgitation. Mitral valve thickening without significant calcification Restricted mitral valve excursion. Severe mitral stenosis. Mean PG 12 mmHg, MVA 0.9 mm2 by PHT method at HR 72 bpm. Moderate to severe regurgitation. Consider rheumatic valve disease. Moderate tricuspid regurgitation. Estimated pulmonary artery systolic pressure 48 mmHg.              EKG: Normal sinus rhythm 88 no other abnormalities  Recent Labs: 10/28/2021: ALT 17; BUN 10; Creatinine, Ser 1.03; Hemoglobin 10.6; Platelets 197; Potassium 4.3; Sodium 139  Recent Lipid Panel  Component Value Date/Time   CHOL 158 12/13/2018 0647   TRIG 133 12/13/2018 0647   HDL 51 12/13/2018 0647   CHOLHDL 3.1 12/13/2018 0647   VLDL 27 12/13/2018 0647   LDLCALC 80 12/13/2018 0647     Risk Assessment/Calculations:              Physical Exam:    VS:  BP 118/64   Pulse 88   Ht 5\' 3"  (1.6 m)   Wt 181 lb 12.8 oz (82.5 kg)   SpO2 96%   BMI 32.20 kg/m     Wt Readings from Last 3 Encounters:  10/27/22 181 lb 12.8 oz (82.5 kg)  03/14/22 190 lb (86.2 kg)  10/28/21 188 lb (85.3 kg)     GEN:  Well nourished, well developed in no acute distress HEENT: Normal NECK: No JVD; No carotid bruits LYMPHATICS: No lymphadenopathy CARDIAC: RRR, systolic murmurs, rubs, gallops RESPIRATORY:  Clear to auscultation without rales, wheezing or rhonchi  ABDOMEN: Soft, non-tender, non-distended MUSCULOSKELETAL:  No edema; No deformity  SKIN: Warm and dry NEUROLOGIC:  Alert and oriented x 3 PSYCHIATRIC:  Normal affect   ASSESSMENT:    1. Pre-procedure lab exam   2. Atherosclerosis of aorta (HCC)   3. Coronary artery calcification seen on computed tomography   4. Type 2 diabetes mellitus with hyperlipidemia (HCC)   5. Type 2 diabetes mellitus with other circulatory complication, without long-term current use of insulin (HCC)   6. Mitral valve disease    PLAN:    In order  of problems listed above:  Severe mitral stenosis - We will go ahead and evaluate with transesophageal echocardiogram.  Several years ago she had an esophageal dilatation performed.  She has not had any difficulties with food getting stuck in her esophagus.  No food impactions noted.  I think we can safely proceed with gentle guidance and precautions. -We will also obtain cardiac catheterization right and left. -After this we will refer her to surgery for evaluation.  We are going to try to get all of the procedures done on the same day for her convenience.      Shared Decision Making/Informed Consent The risks [esophageal damage, perforation (1:10,000 risk), bleeding, pharyngeal hematoma as well as other potential complications associated with conscious sedation including aspiration, arrhythmia, respiratory failure and death], benefits (treatment guidance and diagnostic support) and alternatives of a transesophageal echocardiogram were discussed in detail with Ms. Postlethwait and she is willing to proceed.    Risks benefits of cardiac catheterization including stroke heart attack death renal impairment bleeding have been discussed.  She is willing to proceed.   Medication Adjustments/Labs and Tests Ordered: Current medicines are reviewed at length with the patient today.  Concerns regarding medicines are outlined above.  Orders Placed This Encounter  Procedures   CBC   Basic metabolic panel   EKG 12-Lead   Meds ordered this encounter  Medications   rosuvastatin (CRESTOR) 40 MG tablet    Sig: Take 1 tablet (40 mg total) by mouth at bedtime.    Dispense:  90 tablet    Refill:  3    Patient Instructions  Medication Instructions:  The current medical regimen is effective;  continue present plan and medications.  *If you need a refill on your cardiac medications before your next appointment, please call your pharmacy*  Lab Work: Please have blood work today (CBC, BMP)  If you have labs  (blood work) drawn today and your tests are completely normal, you  will receive your results only by: MyChart Message (if you have MyChart) OR A paper copy in the mail If you have any lab test that is abnormal or we need to change your treatment, we will call you to review the results.  Testing/Procedures: Your physician has requested that you have a TEE. During a TEE, sound waves are used to create images of your heart. It provides your doctor with information about the size and shape of your heart and how well your heart's chambers and valves are working. In this test, a transducer is attached to the end of a flexible tube that's guided down your throat and into your esophagus (the tube leading from you mouth to your stomach) to get a more detailed image of your heart. You are not awake for the procedure. Please see the instruction sheet given to you today. For further information please visit [email or web address]         Cardiac/Peripheral Catheterization   You are scheduled for a Cardiac Catheterization on Thursday, June 6 with Dr. Tonny Bollman.  1. Please arrive at the [facility] (Main Entrance A) at [facility]: [address] [place], [place] [address] at [employer] parking service is available. You will check in at ADMITTING. The support person will be asked to wait in the waiting room.  It is OK to have someone drop you off and come back when you are ready to be discharged.        Special note: Every effort is made to have your procedure done on time. Please understand that emergencies sometimes delay scheduled procedures.  2. Diet: Do not eat solid foods after midnight.  You may have clear liquids until 5 AM the day of the procedure.  3. Labs: today (CBC, BMP)  4. Medication instructions in preparation for your procedure:   Contrast Allergy: No  Do not take Diabetes Med Glucophage (Metformin) on the day of the procedure and HOLD 48 HOURS AFTER THE  PROCEDURE.  On the morning of your procedure, take Aspirin 81 mg and any morning medicines NOT listed above.  You may use sips of water.  5. Plan to go home the same day, you will only stay overnight if medically necessary. 6. You MUST have a responsible adult to drive you home. 7. An adult MUST be with you the first 24 hours after you arrive home. 8. Bring a current list of your medications, and the last time and date medication taken. 9. Bring ID and current insurance cards. 10.Please wear clothes that are easy to get on and off and wear slip-on shoes.  Thank you for allowing [place] to care for you!   -- [facility]  Follow-Up: At [facility], you and your health needs are our priority.  As part of our continuing mission to provide you with exceptional heart care, we have created designated Provider Care Teams.  These Care Teams include your primary Cardiologist (physician) and Advanced Practice Providers (APPs -  Physician Assistants and Nurse Practitioners) who all work together to provide you with the care you need, when you need it.  We recommend signing up for the patient portal called "MyChart".  Sign up information is provided on this After Visit Summary.  MyChart is used to connect with patients for Virtual Visits (Telemedicine).  Patients are able to view lab/test results, encounter notes, upcoming appointments, etc.  Non-urgent messages can be sent to your provider as well.  To learn more about what you can do with MyChart, go to [email or web address].    Your next appointment:   Will be scheduled after your procedure.     Signed, Donato Schultz, MD  10/27/2022 4:27 PM    [facility]

## 2022-10-27 NOTE — Patient Instructions (Signed)
Medication Instructions:  The current medical regimen is effective;  continue present plan and medications.  *If you need a refill on your cardiac medications before your next appointment, please call your pharmacy*  Lab Work: Please have blood work today (CBC, BMP)  If you have labs (blood work) drawn today and your tests are completely normal, you will receive your results only by: MyChart Message (if you have MyChart) OR A paper copy in the mail If you have any lab test that is abnormal or we need to change your treatment, we will call you to review the results.  Testing/Procedures: Your physician has requested that you have a TEE. During a TEE, sound waves are used to create images of your heart. It provides your doctor with information about the size and shape of your heart and how well your heart's chambers and valves are working. In this test, a transducer is attached to the end of a flexible tube that's guided down your throat and into your esophagus (the tube leading from you mouth to your stomach) to get a more detailed image of your heart. You are not awake for the procedure. Please see the instruction sheet given to you today. For further information please visit [email or web address]         Cardiac/Peripheral Catheterization   You are scheduled for a Cardiac Catheterization on Thursday, June 6 with Dr. Tonny Bollman.  1. Please arrive at the [facility] (Main Entrance A) at [facility]: [address] [place], [place] [address] at [employer] parking service is available. You will check in at ADMITTING. The support person will be asked to wait in the waiting room.  It is OK to have someone drop you off and come back when you are ready to be discharged.        Special note: Every effort is made to have your procedure done on time. Please understand that emergencies sometimes delay scheduled procedures.  2. Diet: Do not eat solid foods after midnight.  You may  have clear liquids until 5 AM the day of the procedure.  3. Labs: today (CBC, BMP)  4. Medication instructions in preparation for your procedure:   Contrast Allergy: No  Do not take Diabetes Med Glucophage (Metformin) on the day of the procedure and HOLD 48 HOURS AFTER THE PROCEDURE.  On the morning of your procedure, take Aspirin 81 mg and any morning medicines NOT listed above.  You may use sips of water.  5. Plan to go home the same day, you will only stay overnight if medically necessary. 6. You MUST have a responsible adult to drive you home. 7. An adult MUST be with you the first 24 hours after you arrive home. 8. Bring a current list of your medications, and the last time and date medication taken. 9. Bring ID and current insurance cards. 10.Please wear clothes that are easy to get on and off and wear slip-on shoes.  Thank you for allowing [place] to care for you!   -- [place] [facility]  Follow-Up: At [facility], you and your health needs are our priority.  As part of our continuing mission to provide you with exceptional heart care, we have created designated Provider Care Teams.  These Care Teams include your primary Cardiologist (physician) and Advanced Practice Providers (APPs -  Physician Assistants and Nurse Practitioners) who all work together to provide you with the care you need, when you need it.  We recommend signing up for the patient portal called "MyChart".  Sign up information is provided on this After Visit Summary.  MyChart is used to connect with patients for Virtual Visits (Telemedicine).  Patients are able to view lab/test results, encounter notes, upcoming appointments, etc.  Non-urgent messages can be sent to your provider as well.   To learn more about what you can do with MyChart, go to [email or web address].    Your next appointment:   Will be scheduled after your procedure.

## 2022-10-27 NOTE — Progress Notes (Signed)
Cardiology Office Note:    Date:  10/27/2022   ID:  Rose [name], DOB 04/27/1956, MRN [id number]  PCP:  Shah, Syed Asad A, MD   [facility] Providers Cardiologist:  None     Referring MD: Shah, Syed Asad A, MD    History of Present Illness:    Rose [name] is a 67 y.o. female here for the evaluation of mitral stenosis at the request of Dr. Shah.  Echocardiogram from 05/18/2021 showed moderate to severe mitral stenosis with mean peak gradient of 12 mmHg with valve area of 0.9 mm by pressure half-time method.  Rheumatic heart disease was considered.  Pulmonary artery systolic pressure was 48 mmHg.  Nuclear stress test in 2022 showed normal perfusion.  Increased RV uptake suggested RVH.  PE protocol CT in January 2023 showed no PE.  Positive aortic atherosclerosis with enlarged left atrium.  Has HA, fibromyagia. COPD. Prior dilation of esophagus naybe 7 years ago. Eat anything diarrhea. Yogurt only.   Past Medical History:  Diagnosis Date   Allergy    Anemia    Anxiety    Arthritis    Asthma    Back pain    CAD (coronary artery disease)    Chronic headaches    Class 1 obesity due to excess calories with serious comorbidity in adult    Complication of anesthesia    slow to wake up   COPD (chronic obstructive pulmonary disease) (HCC)    Diabetes (HCC)    Fatty liver    GERD (gastroesophageal reflux disease)    Headache    Herniated lumbar intervertebral disc    High cholesterol    History of blood clots    Lumbago with sciatica, unspecified side    Mitral stenosis    severe by 05/18/21 echo   Moderate episode of recurrent major depressive disorder (HCC)    Nicotine dependence, cigarettes, with other nicotine-induced disorders    Nonrheumatic aortic (valve) insufficiency    Other chronic pain    Pain in joint involving ankle and foot    Pancreatitis    Pneumonia    Presence urogenital implant    Sleep apnea    no cpap   Stroke (HCC)    mild   SUI  (stress urinary incontinence, female)    Type 2 diabetes mellitus with hyperglycemia, with long-term current use of insulin (HCC)     Past Surgical History:  Procedure Laterality Date   ANKLE SURGERY     x 3   Bladder tack     BREAST REDUCTION SURGERY     CHOLECYSTECTOMY  1998   HERNIA REPAIR     I & D EXTREMITY Right 10/28/2021   Procedure: IRRIGATION AND DEBRIDEMENT EXTREMITY, GREAT TOE;  Surgeon: Ramanathan, Deepak, MD;  Location: [facility] OR;  Service: Orthopedics;  Laterality: Right;   VAGINAL HYSTERECTOMY  1988    Current Medications: Current Meds  Medication Sig   albuterol (VENTOLIN HFA) 108 (90 Base) MCG/ACT inhaler inhale 2 PUFFS into THE lungs EVERY 6 HOURS AS NEEDED FOR WHEEZING OR SHORTNESS OF BREATH (Patient taking differently: Inhale 2 puffs into the lungs every 6 (six) hours as needed for shortness of breath or wheezing.)   aspirin EC 81 MG tablet Take 1 tablet (81 mg total) by mouth daily. Swallow whole.   clonazePAM (KLONOPIN) 1 MG tablet Take 1 mg by mouth in the morning, at noon, and at bedtime.   clotrimazole-betamethasone (LOTRISONE) cream Apply 1 application topically 2 (two) times daily.     gabapentin (NEURONTIN) 600 MG tablet Take 600 mg by mouth 3 (three) times daily as needed (muscle spasms).   metFORMIN (GLUCOPHAGE) 500 MG tablet Take 500 mg by mouth 2 (two) times daily.   methocarbamol (ROBAXIN) 500 MG tablet Take 1 tablet (500 mg total) by mouth every 8 (eight) hours as needed for muscle spasms.   Multiple Vitamins-Minerals (CENTRUM ADULTS PO) Take 1 tablet by mouth daily.   omeprazole (PRILOSEC) 40 MG capsule Take 40 mg by mouth daily.   ondansetron (ZOFRAN-ODT) 4 MG disintegrating tablet Take 1 tablet (4 mg total) by mouth every 8 (eight) hours as needed for nausea or vomiting.   traZODone (DESYREL) 50 MG tablet Take 75 mg by mouth at bedtime. Pt takes 1and 1/2 tablet at bedtime.     Allergies:   Erythromycin, Nickel, Oxycodone, Meperidine, Codeine,  Hydrocodone-acetaminophen, Morphine, Propoxyphene, and Quetiapine   Social History   Socioeconomic History   Marital status: Single    Spouse name: Not on file   Number of children: Not on file   Years of education: Not on file   Highest education level: Not on file  Occupational History   Not on file  Tobacco Use   Smoking status: Some Days    Packs/day: 0.50    Years: 47.00    Additional pack years: 0.00    Total pack years: 23.50    Types: Cigarettes   Smokeless tobacco: Never   Tobacco comments:    0.5 packs smoked daily. ARJ 03/29/21  Vaping Use   Vaping Use: Former   Substances: Nicotine, Flavoring  Substance and Sexual Activity   Alcohol use: No    Alcohol/week: 0.0 standard drinks of alcohol   Drug use: Not Currently    Types: Marijuana   Sexual activity: Not Currently  Other Topics Concern   Not on file  Social History Narrative   Not on file   Social Determinants of Health   Financial Resource Strain: Not on file  Food Insecurity: Not on file  Transportation Needs: Not on file  Physical Activity: Not on file  Stress: Not on file  Social Connections: Not on file     Family History: The patient's family history includes Cancer in her mother and sister; Emphysema in her mother; Heart disease in her father and mother.  ROS:   Please see the history of present illness.     All other systems reviewed and are negative.  EKGs/Labs/Other Studies Reviewed:    The following studies were reviewed today: Cardiac Studies & Procedures       ECHOCARDIOGRAM  PCV ECHOCARDIOGRAM COMPLETE 05/21/2021  Narrative Echocardiogram 05/18/2021: Normal LV systolic function with visual EF 50-55%. Left ventricle cavity is normal in size. Mild concentric hypertrophy of the left ventricle. Normal global wall motion. Diastolic function not assessed due to severity of mitral stenosis and regurgitation. Left atrial cavity is severely dilated at 56 cc/m2. Structurally normal  trileaflet aortic valve with race aortic valve stenosis. No regurgitation. Mitral valve thickening without significant calcification Restricted mitral valve excursion. Severe mitral stenosis. Mean PG 12 mmHg, MVA 0.9 mm2 by PHT method at HR 72 bpm. Moderate to severe regurgitation. Consider rheumatic valve disease. Moderate tricuspid regurgitation. Estimated pulmonary artery systolic pressure 48 mmHg.              EKG: Normal sinus rhythm 88 no other abnormalities  Recent Labs: 10/28/2021: ALT 17; BUN 10; Creatinine, Ser 1.03; Hemoglobin 10.6; Platelets 197; Potassium 4.3; Sodium 139  Recent Lipid Panel      Component Value Date/Time   CHOL 158 12/13/2018 0647   TRIG 133 12/13/2018 0647   HDL 51 12/13/2018 0647   CHOLHDL 3.1 12/13/2018 0647   VLDL 27 12/13/2018 0647   LDLCALC 80 12/13/2018 0647     Risk Assessment/Calculations:              Physical Exam:    VS:  BP 118/64   Pulse 88   Ht 5' 3" (1.6 m)   Wt 181 lb 12.8 oz (82.5 kg)   SpO2 96%   BMI 32.20 kg/m     Wt Readings from Last 3 Encounters:  10/27/22 181 lb 12.8 oz (82.5 kg)  03/14/22 190 lb (86.2 kg)  10/28/21 188 lb (85.3 kg)     GEN:  Well nourished, well developed in no acute distress HEENT: Normal NECK: No JVD; No carotid bruits LYMPHATICS: No lymphadenopathy CARDIAC: RRR, systolic murmurs, rubs, gallops RESPIRATORY:  Clear to auscultation without rales, wheezing or rhonchi  ABDOMEN: Soft, non-tender, non-distended MUSCULOSKELETAL:  No edema; No deformity  SKIN: Warm and dry NEUROLOGIC:  Alert and oriented x 3 PSYCHIATRIC:  Normal affect   ASSESSMENT:    1. Pre-procedure lab exam   2. Atherosclerosis of aorta (HCC)   3. Coronary artery calcification seen on computed tomography   4. Type 2 diabetes mellitus with hyperlipidemia (HCC)   5. Type 2 diabetes mellitus with other circulatory complication, without long-term current use of insulin (HCC)   6. Mitral valve disease    PLAN:    In order  of problems listed above:  Severe mitral stenosis - We will go ahead and evaluate with transesophageal echocardiogram.  Several years ago she had an esophageal dilatation performed.  She has not had any difficulties with food getting stuck in her esophagus.  No food impactions noted.  I think we can safely proceed with gentle guidance and precautions. -We will also obtain cardiac catheterization right and left. -After this we will refer her to surgery for evaluation.  We are going to try to get all of the procedures done on the same day for her convenience.      Shared Decision Making/Informed Consent The risks [esophageal damage, perforation (1:10,000 risk), bleeding, pharyngeal hematoma as well as other potential complications associated with conscious sedation including aspiration, arrhythmia, respiratory failure and death], benefits (treatment guidance and diagnostic support) and alternatives of a transesophageal echocardiogram were discussed in detail with Ms. Stoffers and she is willing to proceed.    Risks benefits of cardiac catheterization including stroke heart attack death renal impairment bleeding have been discussed.  She is willing to proceed.   Medication Adjustments/Labs and Tests Ordered: Current medicines are reviewed at length with the patient today.  Concerns regarding medicines are outlined above.  Orders Placed This Encounter  Procedures   CBC   Basic metabolic panel   EKG 12-Lead   Meds ordered this encounter  Medications   rosuvastatin (CRESTOR) 40 MG tablet    Sig: Take 1 tablet (40 mg total) by mouth at bedtime.    Dispense:  90 tablet    Refill:  3    Patient Instructions  Medication Instructions:  The current medical regimen is effective;  continue present plan and medications.  *If you need a refill on your cardiac medications before your next appointment, please call your pharmacy*  Lab Work: Please have blood work today (CBC, BMP)  If you have labs  (blood work) drawn today and your tests are completely normal, you   will receive your results only by: MyChart Message (if you have MyChart) OR A paper copy in the mail If you have any lab test that is abnormal or we need to change your treatment, we will call you to review the results.  Testing/Procedures: Your physician has requested that you have a TEE. During a TEE, sound waves are used to create images of your heart. It provides your doctor with information about the size and shape of your heart and how well your heart's chambers and valves are working. In this test, a transducer is attached to the end of a flexible tube that's guided down your throat and into your esophagus (the tube leading from you mouth to your stomach) to get a more detailed image of your heart. You are not awake for the procedure. Please see the instruction sheet given to you today. For further information please visit [email or web address].         Cardiac/Peripheral Catheterization   You are scheduled for a Cardiac Catheterization on Thursday, June 6 with Dr. Michael Cooper.  1. Please arrive at the [facility] Tower (Main Entrance A) at [facility]: [address] [place], [place] [address] at [address]  Free valet parking service is available. You will check in at ADMITTING. The support person will be asked to wait in the waiting room.  It is OK to have someone drop you off and come back when you are ready to be discharged.        Special note: Every effort is made to have your procedure done on time. Please understand that emergencies sometimes delay scheduled procedures.  2. Diet: Do not eat solid foods after midnight.  You may have clear liquids until 5 AM the day of the procedure.  3. Labs: today (CBC, BMP)  4. Medication instructions in preparation for your procedure:   Contrast Allergy: No  Do not take Diabetes Med Glucophage (Metformin) on the day of the procedure and HOLD 48 HOURS AFTER THE  PROCEDURE.  On the morning of your procedure, take Aspirin 81 mg and any morning medicines NOT listed above.  You may use sips of water.  5. Plan to go home the same day, you will only stay overnight if medically necessary. 6. You MUST have a responsible adult to drive you home. 7. An adult MUST be with you the first 24 hours after you arrive home. 8. Bring a current list of your medications, and the last time and date medication taken. 9. Bring ID and current insurance cards. 10.Please wear clothes that are easy to get on and off and wear slip-on shoes.  Thank you for allowing us to care for you!   -- [facility]  Follow-Up: At [facility], you and your health needs are our priority.  As part of our continuing mission to provide you with exceptional heart care, we have created designated Provider Care Teams.  These Care Teams include your primary Cardiologist (physician) and Advanced Practice Providers (APPs -  Physician Assistants and Nurse Practitioners) who all work together to provide you with the care you need, when you need it.  We recommend signing up for the patient portal called "MyChart".  Sign up information is provided on this After Visit Summary.  MyChart is used to connect with patients for Virtual Visits (Telemedicine).  Patients are able to view lab/test results, encounter notes, upcoming appointments, etc.  Non-urgent messages can be sent to your provider as well.     To learn more about what you can do with MyChart, go to [email or web address].    Your next appointment:   Will be scheduled after your procedure.     Signed, Chavis Tessler, MD  10/27/2022 4:27 PM    [facility] 

## 2022-10-28 LAB — BASIC METABOLIC PANEL
BUN/Creatinine Ratio: 10 — ABNORMAL LOW (ref 12–28)
BUN: 10 mg/dL (ref 8–27)
CO2: 20 mmol/L (ref 20–29)
Calcium: 8.9 mg/dL (ref 8.7–10.3)
Chloride: 104 mmol/L (ref 96–106)
Creatinine, Ser: 0.99 mg/dL (ref 0.57–1.00)
Glucose: 116 mg/dL — ABNORMAL HIGH (ref 70–99)
Potassium: 3.9 mmol/L (ref 3.5–5.2)
Sodium: 140 mmol/L (ref 134–144)
eGFR: 63 mL/min/{1.73_m2} (ref >59)

## 2022-10-28 LAB — CBC
Hematocrit: 36.4 % (ref 34.0–46.6)
Hemoglobin: 12.1 g/dL (ref 11.1–15.9)
MCH: 28.3 pg (ref 26.6–33.0)
MCHC: 33.2 g/dL (ref 31.5–35.7)
MCV: 85 fL (ref 79–97)
Platelets: 236 10*3/uL (ref 150–450)
RBC: 4.27 x10E6/uL (ref 3.77–5.28)
RDW: 15.6 % — ABNORMAL HIGH (ref 11.7–15.4)
WBC: 5.4 10*3/uL (ref 3.4–10.8)

## 2022-10-30 ENCOUNTER — Other Ambulatory Visit: Payer: Self-pay | Admitting: Otolaryngology

## 2022-10-30 DIAGNOSIS — K118 Other diseases of salivary glands: Secondary | ICD-10-CM

## 2022-10-31 ENCOUNTER — Other Ambulatory Visit (HOSPITAL_COMMUNITY): Payer: Self-pay | Admitting: Otolaryngology

## 2022-10-31 ENCOUNTER — Telehealth: Payer: Self-pay | Admitting: *Deleted

## 2022-10-31 DIAGNOSIS — K118 Other diseases of salivary glands: Secondary | ICD-10-CM

## 2022-10-31 NOTE — Telephone Encounter (Signed)
Patient asking if okay for her have Epid[facility] Injection at [facility] tomorrow at 11 AM. Patient advised I will forward to Dr Erasto Sleight Fu for recommendation about proceeding with Epidural Injection tomorrow.

## 2022-10-31 NOTE — Telephone Encounter (Signed)
Thank you Dr Mykell Rawl Fu

## 2022-10-31 NOTE — Progress Notes (Signed)
Mir, Al Corpus, MD  Leodis Rains D PROCEDURE / BIOPSY REVIEW Date: 10/31/22  Requested Biopsy site: Right parotid cyst Reason for request: Persistent lesion Imaging review: Best seen on US Neck 09/21/2022  Decision: Approved Imaging modality to perform: Ultrasound Schedule with: No sedation / Local anesthetic Schedule for: Any VIR  Additional comments: @Schedulers . US guided aspiration of right parotid cyst.  Please contact me with questions, concerns, or if issue pertaining to this request arise.  Al Corpus Mir, MD [facility]

## 2022-10-31 NOTE — Telephone Encounter (Signed)
Patient aware okay to proceed with Epidural tomorrow.

## 2022-10-31 NOTE — Telephone Encounter (Signed)
Cardiac Catheterization scheduled at [facility] for Thursday November 02, 2022 3 /TEE 1:30 PM Arrival time [facility] Main Entrance A at: 12:30 PM  Nothing to eat after midnight prior to procedure, clear liquids until 5 AM day of procedure.  Medication instructions: -Hold:  Metformin-day of procedure and 48 hours post procedure -Other usual morning medications can be taken with sips of water including aspirin 81 mg.  Confirmed patient has responsible adult to drive home post procedure and be with patient first 24 hours after arriving home.  Plan to go home the same day, you will only stay overnight if medically necessary.  Reviewed procedure instructions with patient.

## 2022-11-01 NOTE — Pre-Procedure Instructions (Signed)
Spoke to patient on phone regarding TEE - instructed patient to arrive at 1200, NPO after midnight, confirmed patient has ride home and responsible person to stay with patient for 24 hours after procedure, instructed patient to take aspirin in the AM with a sip of water and don't take metformin tonight and tomorrow

## 2022-11-02 ENCOUNTER — Other Ambulatory Visit: Payer: Self-pay

## 2022-11-02 ENCOUNTER — Ambulatory Visit (HOSPITAL_BASED_OUTPATIENT_CLINIC_OR_DEPARTMENT_OTHER): Payer: 59 | Admitting: Anesthesiology

## 2022-11-02 ENCOUNTER — Ambulatory Visit (HOSPITAL_COMMUNITY)
Admission: RE | Admit: 2022-11-02 | Discharge: 2022-11-02 | Disposition: A | Discharge status: Home / Self Care | Payer: 59 | Attending: Cardiology | Admitting: Cardiology

## 2022-11-02 ENCOUNTER — Encounter (HOSPITAL_COMMUNITY): Payer: Self-pay | Admitting: Cardiology

## 2022-11-02 ENCOUNTER — Ambulatory Visit (HOSPITAL_BASED_OUTPATIENT_CLINIC_OR_DEPARTMENT_OTHER)
Admission: RE | Admit: 2022-11-02 | Discharge: 2022-11-02 | Disposition: A | Discharge status: Home / Self Care | Payer: 59 | Source: Ambulatory Visit | Attending: Cardiology | Admitting: Cardiology

## 2022-11-02 ENCOUNTER — Ambulatory Visit (HOSPITAL_COMMUNITY): Payer: 59 | Admitting: Anesthesiology

## 2022-11-02 ENCOUNTER — Encounter (HOSPITAL_COMMUNITY)
Admission: RE | Disposition: A | Discharge status: Home / Self Care | Payer: Self-pay | Source: Home / Self Care | Attending: Cardiology

## 2022-11-02 ENCOUNTER — Encounter (HOSPITAL_COMMUNITY)
Admission: RE | Disposition: A | Discharge status: Home / Self Care | Payer: 59 | Source: Home / Self Care | Attending: Cardiology

## 2022-11-02 DIAGNOSIS — I2510 Atherosclerotic heart disease of native coronary artery without angina pectoris: Secondary | ICD-10-CM

## 2022-11-02 DIAGNOSIS — I059 Rheumatic mitral valve disease, unspecified: Secondary | ICD-10-CM

## 2022-11-02 DIAGNOSIS — I052 Rheumatic mitral stenosis with insufficiency: Secondary | ICD-10-CM | POA: Diagnosis present

## 2022-11-02 DIAGNOSIS — I342 Nonrheumatic mitral (valve) stenosis: Secondary | ICD-10-CM | POA: Diagnosis not present

## 2022-11-02 DIAGNOSIS — F17210 Nicotine dependence, cigarettes, uncomplicated: Secondary | ICD-10-CM | POA: Diagnosis not present

## 2022-11-02 DIAGNOSIS — I340 Nonrheumatic mitral (valve) insufficiency: Secondary | ICD-10-CM

## 2022-11-02 DIAGNOSIS — I700 Atherosclerosis of aorta: Secondary | ICD-10-CM | POA: Insufficient documentation

## 2022-11-02 DIAGNOSIS — F1910 Other psychoactive substance abuse, uncomplicated: Secondary | ICD-10-CM | POA: Diagnosis not present

## 2022-11-02 DIAGNOSIS — J449 Chronic obstructive pulmonary disease, unspecified: Secondary | ICD-10-CM | POA: Insufficient documentation

## 2022-11-02 DIAGNOSIS — E119 Type 2 diabetes mellitus without complications: Secondary | ICD-10-CM | POA: Insufficient documentation

## 2022-11-02 DIAGNOSIS — E1169 Type 2 diabetes mellitus with other specified complication: Secondary | ICD-10-CM

## 2022-11-02 DIAGNOSIS — Z01812 Encounter for preprocedural laboratory examination: Secondary | ICD-10-CM

## 2022-11-02 DIAGNOSIS — E1159 Type 2 diabetes mellitus with other circulatory complications: Secondary | ICD-10-CM

## 2022-11-02 DIAGNOSIS — E785 Hyperlipidemia, unspecified: Secondary | ICD-10-CM | POA: Diagnosis not present

## 2022-11-02 DIAGNOSIS — E1149 Type 2 diabetes mellitus with other diabetic neurological complication: Secondary | ICD-10-CM

## 2022-11-02 HISTORY — PX: RIGHT/LEFT HEART CATH AND CORONARY ANGIOGRAPHY: CATH118266

## 2022-11-02 HISTORY — PX: TEE WITHOUT CARDIOVERSION: SHX5443

## 2022-11-02 LAB — POCT I-STAT 7, (LYTES, BLD GAS, ICA,H+H)
Acid-base deficit: 1 mmol/L (ref 0.0–2.0)
Bicarbonate: 24.5 mmol/L (ref 20.0–28.0)
Calcium, Ion: 1.14 mmol/L — ABNORMAL LOW (ref 1.15–1.40)
HCT: 33 % — ABNORMAL LOW (ref 36.0–46.0)
Hemoglobin: 11.2 g/dL — ABNORMAL LOW (ref 12.0–15.0)
O2 Saturation: 99 %
Potassium: 3.7 mmol/L (ref 3.5–5.1)
Sodium: 140 mmol/L (ref 135–145)
TCO2: 26 mmol/L (ref 22–32)
pCO2 arterial: 42.7 mmHg (ref 32–48)
pH, Arterial: 7.367 (ref 7.35–7.45)
pO2, Arterial: 120 mmHg — ABNORMAL HIGH (ref 83–108)

## 2022-11-02 LAB — POCT I-STAT EG7
Acid-Base Excess: 0 mmol/L (ref 0.0–2.0)
Bicarbonate: 25.4 mmol/L (ref 20.0–28.0)
Calcium, Ion: 1.17 mmol/L (ref 1.15–1.40)
HCT: 33 % — ABNORMAL LOW (ref 36.0–46.0)
Hemoglobin: 11.2 g/dL — ABNORMAL LOW (ref 12.0–15.0)
O2 Saturation: 67 %
Potassium: 3.7 mmol/L (ref 3.5–5.1)
Sodium: 141 mmol/L (ref 135–145)
TCO2: 27 mmol/L (ref 22–32)
pCO2, Ven: 46 mmHg (ref 44–60)
pH, Ven: 7.351 (ref 7.25–7.43)
pO2, Ven: 37 mmHg (ref 32–45)

## 2022-11-02 LAB — ECHO TEE
AV Mean grad: 5 mmHg
AV Peak grad: 10 mmHg
Ao pk vel: 1.58 m/s
MV M vel: 5.15 m/s
MV Peak grad: 106.1 mmHg
Radius: 1.8 cm

## 2022-11-02 LAB — GLUCOSE, CAPILLARY: Glucose-Capillary: 136 mg/dL — ABNORMAL HIGH (ref 70–99)

## 2022-11-02 SURGERY — ECHOCARDIOGRAM, TRANSESOPHAGEAL
Anesthesia: Monitor Anesthesia Care

## 2022-11-02 SURGERY — RIGHT/LEFT HEART CATH AND CORONARY ANGIOGRAPHY
Anesthesia: LOCAL

## 2022-11-02 MED ORDER — MIDAZOLAM HCL 2 MG/2ML IJ SOLN
INTRAMUSCULAR | Status: AC
  Filled 2022-11-02: qty 2

## 2022-11-02 MED ORDER — LIDOCAINE HCL (PF) 1 % IJ SOLN
INTRAMUSCULAR | Status: DC | PRN
  Administered 2022-11-02 (×2): 2 mL via INTRADERMAL

## 2022-11-02 MED ORDER — SODIUM CHLORIDE 0.9 % WEIGHT BASED INFUSION: 1.0000 mL/kg/h | INTRAVENOUS | Status: DC

## 2022-11-02 MED ORDER — ONDANSETRON HCL 4 MG/2ML IJ SOLN: 4.0000 mg | Freq: Four times a day (QID) | INTRAMUSCULAR | Status: DC | PRN

## 2022-11-02 MED ORDER — SODIUM CHLORIDE 0.9 % IV SOLN: 250.0000 mL | INTRAVENOUS | Status: DC | PRN

## 2022-11-02 MED ORDER — LABETALOL HCL 5 MG/ML IV SOLN: 10.0000 mg | INTRAVENOUS | Status: DC | PRN

## 2022-11-02 MED ORDER — HYDRALAZINE HCL 20 MG/ML IJ SOLN: 10.0000 mg | INTRAMUSCULAR | Status: DC | PRN

## 2022-11-02 MED ORDER — HEPARIN (PORCINE) IN NACL 1000-0.9 UT/500ML-% IV SOLN
INTRAVENOUS | Status: DC | PRN
  Administered 2022-11-02 (×2): 500 mL

## 2022-11-02 MED ORDER — VERAPAMIL HCL 2.5 MG/ML IV SOLN
INTRAVENOUS | Status: AC
  Filled 2022-11-02: qty 2

## 2022-11-02 MED ORDER — VERAPAMIL HCL 2.5 MG/ML IV SOLN
INTRAVENOUS | Status: DC | PRN
  Administered 2022-11-02: 10 mL via INTRA_ARTERIAL

## 2022-11-02 MED ORDER — ASPIRIN 81 MG PO CHEW
81.0000 mg | CHEWABLE_TABLET | ORAL | Status: AC
  Administered 2022-11-02: 81 mg via ORAL

## 2022-11-02 MED ORDER — FENTANYL CITRATE (PF) 100 MCG/2ML IJ SOLN
INTRAMUSCULAR | Status: DC | PRN
  Administered 2022-11-02 (×2): 25 ug via INTRAVENOUS

## 2022-11-02 MED ORDER — FENTANYL CITRATE (PF) 100 MCG/2ML IJ SOLN
INTRAMUSCULAR | Status: AC
  Filled 2022-11-02: qty 2

## 2022-11-02 MED ORDER — SODIUM CHLORIDE 0.9 % IV SOLN: INTRAVENOUS | Status: DC

## 2022-11-02 MED ORDER — LIDOCAINE HCL (PF) 1 % IJ SOLN
INTRAMUSCULAR | Status: AC
  Filled 2022-11-02: qty 30

## 2022-11-02 MED ORDER — SODIUM CHLORIDE 0.9% FLUSH: 3.0000 mL | INTRAVENOUS | Status: DC | PRN

## 2022-11-02 MED ORDER — PHENYLEPHRINE HCL-NACL 20-0.9 MG/250ML-% IV SOLN
INTRAVENOUS | Status: DC | PRN
  Administered 2022-11-02: 50 ug/min via INTRAVENOUS

## 2022-11-02 MED ORDER — HEPARIN SODIUM (PORCINE) 1000 UNIT/ML IJ SOLN
INTRAMUSCULAR | Status: AC
  Filled 2022-11-02: qty 10

## 2022-11-02 MED ORDER — IOHEXOL 350 MG/ML SOLN
INTRAVENOUS | Status: DC | PRN
  Administered 2022-11-02: 20 mL

## 2022-11-02 MED ORDER — PROPOFOL 500 MG/50ML IV EMUL
INTRAVENOUS | Status: DC | PRN
  Administered 2022-11-02: 125 ug/kg/min via INTRAVENOUS
  Administered 2022-11-02: 20 mg via INTRAVENOUS
  Administered 2022-11-02: 40 mg via INTRAVENOUS

## 2022-11-02 MED ORDER — ASPIRIN 81 MG PO CHEW
CHEWABLE_TABLET | ORAL | Status: AC
  Filled 2022-11-02: qty 1

## 2022-11-02 MED ORDER — HEPARIN SODIUM (PORCINE) 1000 UNIT/ML IJ SOLN
INTRAMUSCULAR | Status: DC | PRN
  Administered 2022-11-02: 4000 [IU] via INTRAVENOUS

## 2022-11-02 MED ORDER — SODIUM CHLORIDE 0.9% FLUSH: 3.0000 mL | Freq: Two times a day (BID) | INTRAVENOUS | Status: DC

## 2022-11-02 MED ORDER — MIDAZOLAM HCL 2 MG/2ML IJ SOLN
INTRAMUSCULAR | Status: DC | PRN
  Administered 2022-11-02: 1 mg via INTRAVENOUS
  Administered 2022-11-02: 2 mg via INTRAVENOUS

## 2022-11-02 MED ORDER — SODIUM CHLORIDE 0.9 % WEIGHT BASED INFUSION
3.0000 mL/kg/h | INTRAVENOUS | Status: AC
  Administered 2022-11-02: 3 mL/kg/h via INTRAVENOUS

## 2022-11-02 SURGICAL SUPPLY — 12 items
BAND CMPR LRG ZPHR (HEMOSTASIS) ×1
BAND ZEPHYR COMPRESS 30 LONG (HEMOSTASIS) IMPLANT
CATH 5FR JL3.5 JR4 ANG PIG MP (CATHETERS) IMPLANT
CATH BALLN WEDGE 5F 110CM (CATHETERS) IMPLANT
GLIDESHEATH SLEND SS 6F .021 (SHEATH) IMPLANT
GUIDEWIRE INQWIRE 1.5J.035X260 (WIRE) IMPLANT
INQWIRE 1.5J .035X260CM (WIRE) ×1
KIT HEART LEFT (KITS) ×1 IMPLANT
PACK CARDIAC CATHETERIZATION (CUSTOM PROCEDURE TRAY) ×1 IMPLANT
SHEATH GLIDE SLENDER 4/5FR (SHEATH) IMPLANT
TRANSDUCER W/STOPCOCK (MISCELLANEOUS) ×1 IMPLANT
TUBING CIL FLEX 10 FLL-RA (TUBING) ×1 IMPLANT

## 2022-11-02 NOTE — Progress Notes (Signed)
  Echocardiogram Echocardiogram Transesophageal has been performed.  Sheralyn Boatman R 11/02/2022, 1:20 PM

## 2022-11-02 NOTE — Transfer of Care (Signed)
Immediate Anesthesia Transfer of Care Note  Patient: Rose [name]  Procedure(s) Performed: TRANSESOPHAGEAL ECHOCARDIOGRAM  Patient Location: Cath Lab  Anesthesia Type:MAC  Level of Consciousness: awake, alert , and oriented  Airway & Oxygen Therapy: Patient Spontanous Breathing and Patient connected to nasal cannula oxygen  Post-op Assessment: Report given to RN and Post -op Vital signs reviewed and stable  Post vital signs: Reviewed and stable  Last Vitals:  Vitals Value Taken Time  BP 106/45   Temp 98   Pulse 60   Resp 16   SpO2 96     Last Pain:  Vitals:   11/02/22 1149  PainSc: 8          Complications: No notable events documented.

## 2022-11-02 NOTE — CV Procedure (Signed)
   Transesophageal Echocardiogram  Indications: Mitral stenosis mitral regurgitation  Time out performed  During this procedure the patient was administered propofol under anesthesiology supervision to achieve and maintain moderate sedation.  The patient's heart rate, blood pressure, and oxygen saturation are monitored continuously during the procedure.   Findings:  Left Ventricle: Ejection fraction 65%  Mitral Valve: Mitral valve is rheumatic, thickened 0.5 cm, restricted posteriorly, hockey-stick anteriorly with both severe mitral stenosis (14 mmHg transmitral gradient, reverse Pisa 0.7 cm), severe mitral regurgitation (dilated left atrium with pulmonary vein flow reversal, large laterally deflected jet) 3D imaging utilized to further quantify valve pathology.  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  Plan: Cardiac catheterization right and left today then cardiothoracic surgery consultation as outpatient.  Continue to encourage tobacco cessation.  Donato Schultz, MD
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
## 2022-11-02 NOTE — Discharge Instructions (Addendum)
[facility]  [address]  [place], [place] [address]  Telephone: [phone]   [facility]                                            [address],  [place], [place] [address]                      P: [phone]  [facility]  [name], [place], [place] [address]  Telephone: [phone]  Fax: [phone]  Maricela Bo Cell: [phone]   [facility]  [address], [place] [address]  Telephone: [phone]  Fax: [phone]   [facility]   [address] [place]a Emory [place], [place] [address]  [phone]    Fax: [phone]   Email: [email or web address]   [facility]   [address] [place], [place] [address]  [phone]  Email:[email or web address]   [facility])  [address]  [place], [place] [address]  [phone]  Fax: [phone]  Email: [email or web address]   [facility]  [address]  [place], [place] [address]  Telephone: [phone]   [facility]   [address]  [place], [place] [address]  Telephone: [phone]  Fax: [phone]   [facility]  [address]  Baldemar Friday [place], [place] [address]  Telephone: [phone]  Fax: [phone]   [facility]  [address]  [place], [place] [address]  Telephone: [phone]  Fax: [phone]   [facility]  [address] [place], [place] [address]  Telephone: [phone]    [facility]  [address]   [place], [place], [address]  Telephone: [phone]   [facility]  [address], [place], [place] [address]  Telephone: [phone]   [facility] [place] [facility]  [address], [place], [place]  [address]                              T Telephone: [phone]  Fax: [phone]  [facility]: Phone:  [phone] [address] [place], [place] [address]  [facility]                                      [address] [place], [place] [address]                                    P: [phone] [address]  [place])  [address]  [place], [place] [address]  Telephone: [phone]  Fax: [phone]   [facility] [place])  [phone] [address] [place] [address]  [phone]  Fax: [phone]   [facility])  [address] [place], [place] [address]  [phone]  (appointments), [phone] (general)  Fax: [phone]   [facility]  [address] [place], [place] [address]                                    p: [phone]  [facility])  [address]  [place], [place] [address]  [phone]  Fax: [phone]   [facility]                                                   [address] [place], [place] [address] [phone]     [facility]  [address]  [place], [place] [address]  Telephone: [phone]   [facility]                                            [address],  [place], [place] [address]                      P: [phone]  [facility]  [address], [place], [place] [address]  Telephone: [phone]  Fax: [phone]  Maricela Bo Cell: [phone]   [facility]  [address], [place] [address]  Telephone: [phone]  Fax: [phone]   [facility]   [address] [place], [place] [address]  [phone]    Fax: [phone]   Email:  [email or web address]   [facility]   [address] [place], [place] [address]  [phone]  Email:[email or web address]   [facility])  [address]  [place], [place] [address]  [phone]  Fax: [phone]  Email: [email or web address]   [facility]  [address]  [place], [place] [address]  Telephone: [phone]   [facility]   [address]  [place], [place] [address]  Telephone: [phone]  Fax: [phone]   [facility]  [address]  [place]ar Friday [place], [place] [address]  Telephone: [phone]  Fax: [phone]   [facility]  [address]  [place], [place] [address]  Telephone: [phone]  Fax: [phone]   [facility]  [address] [place], [place] [address]  Telephone: [phone]    [facility]  [address]   [place], [place], [address]  Telephone: [phone]   [facility]  [address], [place], [place] [address]  Telephone: [phone]   [facility] [place] [facility]  [address], [place], [place] [address]                              T Telephone: [phone]  Fax: [phone]  [facility]: Phone:  [phone] [address] [place], [place] [address]  [facility]                                      [address] [place], [place] [address]  P: [phone]  [place])  [address]  [place], [place] [address]  Telephone: [phone]  Fax: [phone]   [facility] [place])  [phone] [address], [place] [address]  [phone]  Fax: [phone]   [facility])  [address], [place], [place] [address]  [phone]  (appointments), [phone] (general)  Fax: [phone]   [facility]                                        [address] [place], [place] [address]                                    p: [phone]  [facility])  [address]  [place], [place] [address]  [phone]  Fax: [phone]   [facility]                                                   [address] [place], [place] [address] [phone]     Radial Site Care Drink plenty of fluid for the next 3 days  Keep arm elevated for the next 24 hours The following information offers guidance on how to care for yourself after your procedure. Your health care provider may also give you more specific instructions. If you have problems or questions, contact your health care provider. What can I expect after the procedure? After the procedure, it is common to have bruising and tenderness in the incision area. Follow these instructions at home: Incision site care  Follow instructions from your health care provider about how to take care of your incision site. Make sure you:  Remove your dressing 24 hours after discharge.  Do not take baths, swim, or use a hot tub for 1 week. You may shower 24 hours after the procedure or as told by your health care provider. Remove the dressing and gently wash the incision area with plain soap and water. Pat the area dry with a clean towel. Do not rub the site. That could cause bleeding. Do not apply powder or lotion to the site. Check your incision site every day for signs of infection. Check for: Redness, swelling, or pain. Fluid or blood. Warmth. Pus or a bad smell. Activity For 24 hours after the procedure, or as directed by your health care provider: Do not flex or bend the affected arm. Do not push or pull heavy objects with the affected arm. Do not operate  machinery or power tools. Do not drive. You should not drive yourself home from the hospital or clinic if you go home during that time period. You may drive 24 hours after the procedure unless your health care provider tells you not to. Do not lift anything that is heavier than 10 lb (4.5 kg), or the limit that you are told, until your health care provider says that it is safe. Return to your normal activities as told by your health care provider. Ask your health care provider what activities are safe for you and when you can return to work. If you were given a sedative  during the procedure, it can affect you for several hours. Do not drive or operate machinery until your health care provider says that it is safe. General instructions Take over-the-counter and prescription medicines only as told by your health care provider. If you will be going home right after the procedure, plan to have a responsible adult care for you for the time you are told. This is important. Keep all follow-up visits. This is important. Contact a health care provider if: You have a fever or chills. You have any of these signs of infection at your incision site: Redness, swelling, or pain. Fluid or blood. Warmth. Pus or a bad smell. Get help right away if: The incision area swells very fast. The incision area is bleeding, and the bleeding does not stop when you hold steady pressure on the area. Your arm or hand becomes pale, cool, tingly, or numb. These symptoms may represent a serious problem that is an emergency. Do not wait to see if the symptoms will go away. Get medical help right away. Call your local emergency services (911 in the [place] Do not drive yourself to the hospital. Summary After the procedure, it is common to have bruising and tenderness at the incision site. Follow instructions from your health care provider about how to take care of your radial site incision. Check the incision every day for signs  of infection. Do not lift anything that is heavier than 10 lb (4.5 kg), or the limit that you are told, until your health care provider says that it is safe. Get help right away if the incision area swells very fast, you have bleeding at the incision site that will not stop, or your arm or hand becomes pale, cool, or numb. This information is not intended to replace advice given to you by your health care provider. Make sure you discuss any questions you have with your health care provider. Document Revised: 07/04/2020 Document Reviewed: 07/04/2020 Elsevier Patient Education  2023 [employer].

## 2022-11-02 NOTE — Interval H&P Note (Signed)
History and Physical Interval Note:  11/02/2022 12:07 PM  Rose [name]  has presented today for surgery, with the diagnosis of mitral stenosis.  The various methods of treatment have been discussed with the patient and family. After consideration of risks, benefits and other options for treatment, the patient has consented to  Procedure(s): TRANSESOPHAGEAL ECHOCARDIOGRAM (N/A) as a surgical intervention.  The patient's history has been reviewed, patient examined, no change in status, stable for surgery.  I have reviewed the patient's chart and labs.  Questions were answered to the patient's satisfaction.     [employer]

## 2022-11-02 NOTE — Anesthesia Preprocedure Evaluation (Signed)
Anesthesia Evaluation  Patient identified by MRN, date of birth, ID band Patient awake    Reviewed: Allergy & Precautions, H&P , NPO status , Patient's Chart, lab work & pertinent test results  Airway Mallampati: II   Neck ROM: full    Dental   Pulmonary asthma , sleep apnea , COPD, Current Smoker   breath sounds clear to auscultation       Cardiovascular + CAD  (-) dysrhythmias  Rhythm:regular Rate:Normal  Mitral stenosis   Neuro/Psych  Headaches PSYCHIATRIC DISORDERS Anxiety Depression     Neuromuscular disease CVA    GI/Hepatic ,GERD  ,,  Endo/Other  diabetes, Type 2    Renal/GU      Musculoskeletal  (+) Arthritis ,    Abdominal   Peds  Hematology   Anesthesia Other Findings   Reproductive/Obstetrics                             Anesthesia Physical Anesthesia Plan  ASA: 3  Anesthesia Plan: MAC   Post-op Pain Management:    Induction: Intravenous  PONV Risk Score and Plan: 1 and Propofol infusion and Treatment may vary due to age or medical condition  Airway Management Planned: Nasal Cannula  Additional Equipment:   Intra-op Plan:   Post-operative Plan:   Informed Consent: I have reviewed the patients History and Physical, chart, labs and discussed the procedure including the risks, benefits and alternatives for the proposed anesthesia with the patient or authorized representative who has indicated his/her understanding and acceptance.     Dental advisory given  Plan Discussed with: CRNA, Anesthesiologist and Surgeon  Anesthesia Plan Comments:        Anesthesia Quick Evaluation

## 2022-11-02 NOTE — Interval H&P Note (Signed)
History and Physical Interval Note:  11/02/2022 2:12 PM  Rose [name]  has presented today for surgery, with the diagnosis of MITRAL STENOSIS.  The various methods of treatment have been discussed with the patient and family. After consideration of risks, benefits and other options for treatment, the patient has consented to  Procedure(s): RIGHT/LEFT HEART CATH AND CORONARY ANGIOGRAPHY (N/A) as a surgical intervention.  The patient's history has been reviewed, patient examined, no change in status, stable for surgery.  I have reviewed the patient's chart and labs.  Questions were answered to the patient's satisfaction.     Tonny Bollman

## 2022-11-02 NOTE — Progress Notes (Signed)
TR BAND REMOVAL  LOCATION:    right radial  DEFLATED PER PROTOCOL:    Yes.    TIME BAND OFF / DRESSING APPLIED:    1650 gauze dressing applied    SITE UPON ARRIVAL:    Level 0  SITE AFTER BAND REMOVAL:    Level 0  CIRCULATION SENSATION AND MOVEMENT:    Within Normal Limits   Yes.    COMMENTS:   no issues noted  

## 2022-11-03 ENCOUNTER — Encounter (HOSPITAL_COMMUNITY): Payer: Self-pay | Admitting: Cardiology

## 2022-11-06 NOTE — Anesthesia Postprocedure Evaluation (Signed)
Anesthesia Post Note  Patient: Rose [name]  Procedure(s) Performed: TRANSESOPHAGEAL ECHOCARDIOGRAM     Patient location during evaluation: Cath Lab Anesthesia Type: MAC Level of consciousness: awake and alert Pain management: pain level controlled Vital Signs Assessment: post-procedure vital signs reviewed and stable Respiratory status: spontaneous breathing, nonlabored ventilation, respiratory function stable and patient connected to nasal cannula oxygen Cardiovascular status: stable and blood pressure returned to baseline Postop Assessment: no apparent nausea or vomiting Anesthetic complications: no   No notable events documented.  Last Vitals:  Vitals:   11/02/22 1626 11/02/22 1647  BP: 128/65 (!) 120/55  Pulse: 78 72  Resp:    Temp:    SpO2: 97% 95%    Last Pain:  Vitals:   11/02/22 1512  TempSrc:   PainSc: 0-No pain                 [name] S

## 2022-11-12 NOTE — Progress Notes (Unsigned)
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 Pt was told she had a valve problem several years ago. Currently she becomes SOB with activity that she feels is more related to her smoking. She has become dissatisfied with Dr Clelia Croft and does not want to see him anymore. However he sent her to Cardiology and work up with TTE and TEE found her to have worsening MS with a a mean gradient of . She was also noted to have severe TR and with moderate to severe PHTN with SPA pressures of . She was taken to cath and is without CAD. She has had a recent lung cancer screening CT with concern for new nodules that was felt to be inflammatory but requested a repeat in 3 months. Pt unfortunately lives alone and had a dog. She is being evicted next month. She has no resources to know how to move forward with housing. She has been using her inhalers more. She is unable to stop smoking.      Past Medical History:  Diagnosis Date   Allergy    Anemia    Anxiety    Arthritis    Asthma    Back pain    CAD (coronary artery disease)    Chronic headaches    Class 1 obesity due to excess calories with serious comorbidity in adult    Complication of anesthesia    slow to wake up   COPD (chronic obstructive pulmonary disease) (HCC)    Diabetes (HCC)    Fatty liver    GERD (gastroesophageal reflux disease)    Headache    Herniated lumbar intervertebral disc    High cholesterol    History of blood clots    Lumbago with sciatica, unspecified side    Mitral stenosis    severe by 05/18/21 echo   Moderate episode of recurrent major depressive disorder (HCC)    Nicotine dependence,  cigarettes, with other nicotine-induced disorders    Nonrheumatic aortic (valve) insufficiency    Other chronic pain    Pain in joint involving ankle and foot    Pancreatitis    Pneumonia    Presence urogenital implant    Sleep apnea    no cpap   Stroke (HCC)    mild   SUI (stress urinary incontinence, female)    Type 2 diabetes mellitus with hyperglycemia, with long-term current use of insulin (HCC)     Past Surgical History:  Procedure Laterality Date   ANKLE SURGERY     x 3   Bladder tack     BREAST REDUCTION SURGERY     CHOLECYSTECTOMY  1998   HERNIA REPAIR     I & D EXTREMITY Right 10/28/2021   Procedure: IRRIGATION AND DEBRIDEMENT EXTREMITY, GREAT TOE;  Surgeon: Netta Cedars, MD;  Location: [facility] OR;  Service: Orthopedics;  Laterality: Right;   RIGHT/LEFT HEART CATH AND CORONARY ANGIOGRAPHY N/A 11/02/2022   Procedure: RIGHT/LEFT HEART CATH AND CORONARY ANGIOGRAPHY;  Surgeon: Tonny Bollman, MD;  Location:  [facility] INVASIVE CV LAB;  Service: Cardiovascular;  Laterality: N/A;   TEE WITHOUT CARDIOVERSION N/A 11/02/2022   Procedure: TRANSESOPHAGEAL ECHOCARDIOGRAM;  Surgeon: Jake Bathe, MD;  Location: [facility] INVASIVE CV LAB;  Service: Cardiovascular;  Laterality: N/A;   VAGINAL HYSTERECTOMY  1988    Social History   Tobacco Use  Smoking Status Some Days   Packs/day: 0.50   Years: 47.00   Additional pack years: 0.00   Total pack years: 23.50   Types: Cigarettes  Smokeless Tobacco Never  Tobacco Comments   0.5 packs smoked daily. ARJ 03/29/21    Social History   Substance and Sexual Activity  Alcohol Use No   Alcohol/week: 0.0 standard drinks of alcohol    Social History   Socioeconomic History   Marital status: Single    Spouse name: Not on file   Number of children: Not on file   Years of education: Not on file   Highest education level: Not on file  Occupational History   Not on file  Tobacco Use   Smoking status: Some Days    Packs/day: 0.50    Years: 47.00     Additional pack years: 0.00    Total pack years: 23.50    Types: Cigarettes   Smokeless tobacco: Never   Tobacco comments:    0.5 packs smoked daily. ARJ 03/29/21  Vaping Use   Vaping Use: Former   Substances: Nicotine, Flavoring  Substance and Sexual Activity   Alcohol use: No    Alcohol/week: 0.0 standard drinks of alcohol   Drug use: Not Currently    Types: Marijuana   Sexual activity: Not Currently  Other Topics Concern   Not on file  Social History Narrative   Not on file   Social Determinants of Health   Financial Resource Strain: Not on file  Food Insecurity: Not on file  Transportation Needs: Not on file  Physical Activity: Not on file  Stress: Not on file  Social Connections: Not on file  Intimate Partner Violence: Not on file    Allergies  Allergen Reactions   Erythromycin Shortness Of Breath   Nickel Rash and Shortness Of Breath   Oxycodone Shortness Of Breath   Meperidine Other (See Comments)    Confusion and tingling  (noted in H&P) Confusion and tingling  (noted in H&P)    Codeine Swelling   Hydrocodone-Acetaminophen Itching    Pt reports she can take the 7.5-325 strength.     Morphine Itching   Propoxyphene Other (See Comments)   Quetiapine     Other reaction(s): Other (See Comments) Restless leg syndrome    Current Outpatient Medications  Medication Sig Dispense Refill   albuterol (VENTOLIN HFA) 108 (90 Base) MCG/ACT inhaler inhale 2 PUFFS into THE lungs EVERY 6 HOURS AS NEEDED FOR WHEEZING OR SHORTNESS OF BREATH (Patient taking differently: Inhale 2 puffs into the lungs every 6 (six) hours as needed for shortness of breath or wheezing.) 8.5 g 0   aspirin EC 81 MG tablet Take 1 tablet (81 mg total) by mouth daily. Swallow whole. 30 tablet 11   clonazePAM (KLONOPIN) 1 MG tablet Take 1 mg by mouth in the morning, at noon, and at bedtime.     clotrimazole-betamethasone (LOTRISONE) cream Apply 1 application topically 2 (two) times daily.      gabapentin (NEURONTIN) 600 MG tablet Take 600 mg by mouth 3 (three) times daily as needed (muscle spasms).     metFORMIN (GLUCOPHAGE) 500 MG tablet Take 500 mg  by mouth 2 (two) times daily.     methocarbamol (ROBAXIN) 500 MG tablet Take 1 tablet (500 mg total) by mouth every 8 (eight) hours as needed for muscle spasms. 8 tablet 0   Multiple Vitamins-Minerals (CENTRUM ADULTS PO) Take 1 tablet by mouth daily.     omeprazole (PRILOSEC) 40 MG capsule Take 40 mg by mouth daily.     ondansetron (ZOFRAN-ODT) 4 MG disintegrating tablet Take 1 tablet (4 mg total) by mouth every 8 (eight) hours as needed for nausea or vomiting. 8 tablet 0   rosuvastatin (CRESTOR) 40 MG tablet Take 1 tablet (40 mg total) by mouth at bedtime. 90 tablet 3   traZODone (DESYREL) 50 MG tablet Take 75 mg by mouth at bedtime. Pt takes 1and 1/2 tablet at bedtime.     No current facility-administered medications for this visit.     Family History  Problem Relation Age of Onset   Emphysema Mother    Heart disease Mother    Cancer Mother    Heart disease Father    Cancer Sister        Physical Exam: Lungs: rhonchorous Card: RR with 3/6 sem Ext: no edema Neuro: alert without focal deficits Teeth without active infection     Diagnostic Studies & Laboratory data: I have personally reviewed the following studies and agree with the findings   TEE (10/2022) IMPRESSIONS     1. Severe mitral regurgitation and severe mitral stenosis.   2. Left ventricular ejection fraction, by estimation, is 60 to 65%. The  left ventricle has normal function.   3. Right ventricular systolic function is normal. The right ventricular  size is normal. There is severely elevated pulmonary artery systolic  pressure. The estimated right ventricular systolic pressure is 65.1 mmHg.   4. Left atrial size was severely dilated. No left atrial/left atrial  appendage thrombus was detected.   5. Right atrial size was moderately dilated.   6.  There is no evidence of cardiac tamponade.   7. The mitral valve is rheumatic. Severe mitral valve regurgitation.  Severe mitral stenosis. The mean mitral valve gradient is 14.0 mmHg.   8. Tricuspid valve regurgitation is severe.   9. The aortic valve is tricuspid. Aortic valve regurgitation is trivial.  Aortic valve sclerosis is present, with no evidence of aortic valve  stenosis.  10. The left upper pulmonary vein is abnormal. The inferior vena cava is  dilated in size with >50% respiratory variability, suggesting right atrial  pressure of 8 mmHg.   FINDINGS   Left Ventricle: Left ventricular ejection fraction, by estimation, is 60  to 65%. The left ventricle has normal function. The left ventricular  internal cavity size was normal in size.   Right Ventricle: The right ventricular size is normal. No increase in  right ventricular wall thickness. Right ventricular systolic function is  normal. There is severely elevated pulmonary artery systolic pressure. The  tricuspid regurgitant velocity is  3.54 m/s, and with an assumed right atrial pressure of 15 mmHg, the  estimated right ventricular systolic pressure is 65.1 mmHg.   Left Atrium: Left atrial size was severely dilated. No left atrial/left  atrial appendage thrombus was detected.   Right Atrium: Right atrial size was moderately dilated.   Pericardium: Trivial pericardial effusion is present. There is no evidence  of cardiac tamponade.   Mitral Valve: Mitral valve area by 3DQ 1.88cm2. Vena contracta by 3DQ  .99cm2.  Mitral severe regurgitation with pulmonary flow reversal. The mitral valve  is rheumatic. There is severe thickening of the anterior mitral valve  leaflet(s). There is severe calcification of the posterior mitral valve  leaflet(s). Severe mitral valve  regurgitation, with posteriorly-directed jet. Severe mitral valve  stenosis. MV peak gradient, 23.8 mmHg. The mean mitral valve gradient is  14.0 mmHg.    Tricuspid Valve: The tricuspid valve is grossly normal. Tricuspid valve  regurgitation is severe.   Aortic Valve: The aortic valve is tricuspid. Aortic valve regurgitation is  trivial. Aortic valve sclerosis is present, with no evidence of aortic  valve stenosis. Aortic valve mean gradient measures 5.0 mmHg. Aortic valve  peak gradient measures 10.0 mmHg.   Pulmonic Valve: The pulmonic valve was normal in structure. Pulmonic valve  regurgitation is trivial.   Aorta: The aortic root is normal in size and structure.   Venous: The left upper pulmonary vein is abnormal. The inferior vena cava  is dilated in size with greater than 50% respiratory variability,  suggesting right atrial pressure of 8 mmHg.   IAS/Shunts: No atrial level shunt detected by color flow Doppler.   Additional Comments: Spectral Doppler performed.   AORTIC VALVE  AV Vmax:      158.00 cm/s  AV Vmean:     107.000 cm/s  AV VTI:       0.379 m  AV Peak Grad: 10.0 mmHg  AV Mean Grad: 5.0 mmHg   MITRAL VALVE                  TRICUSPID VALVE  MV Peak grad: 23.8 mmHg       TR Peak grad:   50.1 mmHg  MV Mean grad: 14.0 mmHg       TR Vmax:        354.00 cm/s  MV Vmax:      2.44 m/s  MV Vmean:     164.2 cm/s  MR Peak grad:    106.1 mmHg  MR Mean grad:    69.0 mmHg  MR Vmax:         515.00 cm/s  MR Vmean:        387.0 cm/s  MR PISA:         20.36 cm  MR PISA Eff ROA: 150 mm  MR PISA Radius:  1.80 cm   CATH (10/2022) Conclusion  Patent coronary arteries with mild nonobstructive plaquing in the RCA (Right dominant), no stenosis in the left main, LAD, or LCx Low cardiac output based on Fick measurement (question accuracy as patient seems well-compensated) Hemodynamic findings consistent with severe MS and severe MR (mean trans-mitral gradient 15 mmHg, calculated mitral valve area 0.7 square cm) and large V waves of 50 mmHg    Recent Radiology Findings:       Recent Lab Findings: Lab Results  Component  Value Date   WBC 5.4 10/27/2022   HGB 11.2 (L) 11/02/2022   HCT 33.0 (L) 11/02/2022   PLT 236 10/27/2022   GLUCOSE 116 (H) 10/27/2022   CHOL 158 12/13/2018   TRIG 133 12/13/2018   HDL 51 12/13/2018   LDLCALC 80 12/13/2018   ALT 17 10/28/2021   AST 32 10/28/2021   NA 140 11/02/2022   K 3.7 11/02/2022   CL 104 10/27/2022   CREATININE 0.99 10/27/2022   BUN 10 10/27/2022   CO2 20 10/27/2022   TSH 1.776 12/13/2018   INR 1.0 02/03/2009   HGBA1C 5.4 12/13/2018      Assessment / Plan:     67 yo woman  with NYHA class 2 symptoms of severe MS/MR and severe TR and normal LV function with 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 Pt requires MV replacement and TV repair. She however needs to make arrangements for her housing and her dog. She is going to work on that over the next several weeks. She will then call or I have at least scheduled a follow up appt in mid August to see where she is at able to schedule surgery from there. She understands all the risks and goals of surgery and wishes to proceed. She has a nickel allergy and will need sternal closure without stainless steel wires.   I have spent 60 min in review of the records, viewing studies and in face to face with patient and in coordination of future care    Eugenio Hoes 11/12/2022 6:54 PM

## 2022-11-13 ENCOUNTER — Other Ambulatory Visit: Payer: Self-pay | Admitting: Surgery

## 2022-11-13 ENCOUNTER — Institutional Professional Consult (permissible substitution) (INDEPENDENT_AMBULATORY_CARE_PROVIDER_SITE_OTHER): Payer: 59 | Admitting: Thoracic Surgery (Cardiothoracic Vascular Surgery)

## 2022-11-13 ENCOUNTER — Encounter: Payer: Self-pay | Admitting: Thoracic Surgery (Cardiothoracic Vascular Surgery)

## 2022-11-13 VITALS — BP 123/77 | HR 94 | Resp 20 | Ht 63.0 in | Wt 178.0 lb

## 2022-11-13 DIAGNOSIS — I051 Rheumatic mitral insufficiency: Secondary | ICD-10-CM

## 2022-11-13 DIAGNOSIS — I059 Rheumatic mitral valve disease, unspecified: Secondary | ICD-10-CM

## 2022-11-13 NOTE — Patient Instructions (Signed)
Follow up after plans made for housing and dog Will need repeat CT to follow up on new lung nodules prior to surgery

## 2022-11-16 ENCOUNTER — Ambulatory Visit (HOSPITAL_COMMUNITY): Admission: RE | Admit: 2022-11-16 | Payer: 59 | Source: Ambulatory Visit

## 2022-11-16 ENCOUNTER — Encounter (HOSPITAL_COMMUNITY): Payer: Self-pay

## 2022-11-16 ENCOUNTER — Ambulatory Visit (HOSPITAL_COMMUNITY)
Admission: RE | Admit: 2022-11-16 | Discharge: 2022-11-16 | Disposition: A | Discharge status: Home / Self Care | Payer: 59 | Source: Ambulatory Visit | Attending: Otolaryngology | Admitting: Otolaryngology

## 2022-12-19 ENCOUNTER — Ambulatory Visit
Admission: RE | Admit: 2022-12-19 | Discharge: 2022-12-19 | Disposition: A | Discharge status: Home / Self Care | Payer: 59 | Source: Ambulatory Visit | Attending: Otolaryngology | Admitting: Otolaryngology

## 2022-12-19 DIAGNOSIS — K118 Other diseases of salivary glands: Secondary | ICD-10-CM

## 2022-12-19 MED ORDER — IOPAMIDOL (ISOVUE-300) INJECTION 61%
75.0000 mL | Freq: Once | INTRAVENOUS | Status: AC | PRN
  Administered 2022-12-19: 75 mL via INTRAVENOUS

## 2022-12-20 ENCOUNTER — Ambulatory Visit (HOSPITAL_COMMUNITY): Admission: RE | Admit: 2022-12-20 | Payer: 59 | Source: Ambulatory Visit

## 2023-01-07 NOTE — Progress Notes (Unsigned)
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 Pt has had completed work up for surgery. She is struggling with be evicted from her apartment and still has no place to live following surgery. Her symptoms mostly back pain currently and is able to perform her daily activities. No increasing DOE. She is very nervous about the surgery      Past Medical History:  Diagnosis Date   Allergy    Anemia    Anxiety    Arthritis    Asthma    Back pain    CAD (coronary artery disease)    Chronic headaches    Class 1 obesity due to excess calories with serious comorbidity in adult    Complication of anesthesia    slow to wake up   COPD (chronic obstructive pulmonary disease) (HCC)    Diabetes (HCC)    Fatty liver    GERD (gastroesophageal reflux disease)    Headache    Herniated lumbar intervertebral disc    High cholesterol    History of blood clots    Lumbago with sciatica, unspecified side    Mitral stenosis    severe by 05/18/21 echo   Moderate episode of recurrent major depressive disorder (HCC)    Nicotine dependence, cigarettes, with other nicotine-induced disorders    Nonrheumatic aortic (valve) insufficiency    Other chronic pain    Pain in joint involving ankle and foot    Pancreatitis    Pneumonia    Presence urogenital implant    Sleep apnea    no cpap   Stroke (HCC)    mild   SUI (stress urinary incontinence, female)    Type 2 diabetes mellitus with hyperglycemia, with long-term current use of insulin (HCC)     Past Surgical History:  Procedure Laterality Date   ANKLE SURGERY     x 3   Bladder tack     BREAST REDUCTION SURGERY     CHOLECYSTECTOMY  1998   HERNIA  REPAIR     I & D EXTREMITY Right 10/28/2021   Procedure: IRRIGATION AND DEBRIDEMENT EXTREMITY, GREAT TOE;  Surgeon: Netta Cedars, MD;  Location: [facility] OR;  Service: Orthopedics;  Laterality: Right;   RIGHT/LEFT HEART CATH AND CORONARY ANGIOGRAPHY N/A 11/02/2022   Procedure: RIGHT/LEFT HEART CATH AND CORONARY ANGIOGRAPHY;  Surgeon: Tonny Bollman, MD;  [facility] INVASIVE CV LAB;  Service: Cardiovascular;  Laterality: N/A;   TEE WITHOUT CARDIOVERSION N/A 11/02/2022   Procedure: TRANSESOPHAGEAL ECHOCARDIOGRAM;  Surgeon: Jake Bathe, MD;  Location: [facility] INVASIVE CV LAB;  Service: Cardiovascular;  Laterality: N/A;   VAGINAL HYSTERECTOMY  1988    Social History   Tobacco Use  Smoking Status Some Days   Current packs/day: 0.50   Average packs/day: 0.5 packs/day for 47.0 years (23.5 ttl pk-yrs)   Types: Cigarettes  Smokeless Tobacco Never  Tobacco Comments   0.5 packs smoked daily. ARJ 03/29/21    Social History   Substance and Sexual Activity  Alcohol Use No   Alcohol/week: 0.0 standard drinks of  alcohol    Social History   Socioeconomic History   Marital status: Single    Spouse name: Not on file   Number of children: Not on file   Years of education: Not on file   Highest education level: Not on file  Occupational History   Not on file  Tobacco Use   Smoking status: Some Days    Current packs/day: 0.50    Average packs/day: 0.5 packs/day for 47.0 years (23.5 ttl pk-yrs)    Types: Cigarettes   Smokeless tobacco: Never   Tobacco comments:    0.5 packs smoked daily. ARJ 03/29/21  Vaping Use   Vaping status: Former   Substances: Nicotine, Flavoring  Substance and Sexual Activity   Alcohol use: No    Alcohol/week: 0.0 standard drinks of alcohol   Drug use: Not Currently    Types: Marijuana   Sexual activity: Not Currently  Other Topics Concern   Not on file  Social History Narrative   Not on file   Social Determinants of Health   Financial Resource Strain: Not on  file  Food Insecurity: Not on file  Transportation Needs: Not on file  Physical Activity: Not on file  Stress: Not on file  Social Connections: Unknown (05/17/2022)   Received from [facility], [facility]   Social Network    Social Network: Not on file  Intimate Partner Violence: Unknown (05/17/2022)   Received from [employer], [facility]   HITS    Physically Hurt: Not on file    Insult or Talk Down To: Not on file    Threaten Physical Harm: Not on file    Scream or Curse: Not on file    Allergies  Allergen Reactions   Erythromycin Shortness Of Breath   Nickel Rash and Shortness Of Breath   Oxycodone Shortness Of Breath   Meperidine Other (See Comments)    Confusion and tingling  (noted in H&P) Confusion and tingling  (noted in H&P)    Codeine Swelling   Hydrocodone-Acetaminophen Itching    Pt reports she can take the 7.5-325 strength.     Morphine Itching   Propoxyphene Other (See Comments)   Quetiapine     Other reaction(s): Other (See Comments) Restless leg syndrome    Current Outpatient Medications  Medication Sig Dispense Refill   albuterol (VENTOLIN HFA) 108 (90 Base) MCG/ACT inhaler inhale 2 PUFFS into THE lungs EVERY 6 HOURS AS NEEDED FOR WHEEZING OR SHORTNESS OF BREATH (Patient taking differently: Inhale 2 puffs into the lungs every 6 (six) hours as needed for shortness of breath or wheezing.) 8.5 g 0   aspirin EC 81 MG tablet Take 1 tablet (81 mg total) by mouth daily. Swallow whole. 30 tablet 11   clonazePAM (KLONOPIN) 1 MG tablet Take 1 mg by mouth in the morning, at noon, and at bedtime.     clotrimazole-betamethasone (LOTRISONE) cream Apply 1 application topically 2 (two) times daily.     gabapentin (NEURONTIN) 600 MG tablet Take 600 mg by mouth 3 (three) times daily as needed (muscle spasms).     metFORMIN (GLUCOPHAGE) 500 MG tablet Take 500 mg by mouth 2 (two) times daily.     methocarbamol (ROBAXIN) 500 MG tablet Take 1 tablet (500 mg total) by  mouth every 8 (eight) hours as needed for muscle spasms. 8 tablet 0   Multiple Vitamins-Minerals (CENTRUM ADULTS PO) Take 1 tablet by mouth daily.     omeprazole (PRILOSEC) 40 MG capsule Take 40 mg  by mouth daily.     rosuvastatin (CRESTOR) 40 MG tablet Take 1 tablet (40 mg total) by mouth at bedtime. 90 tablet 3   traZODone (DESYREL) 50 MG tablet Take 75 mg by mouth at bedtime. Pt takes 1and 1/2 tablet at bedtime.     No current facility-administered medications for this visit.     Family History  Problem Relation Age of Onset   Emphysema Mother    Heart disease Mother    Cancer Mother    Heart disease Father    Cancer Sister        Physical Exam:      Diagnostic Studies & Laboratory data: I have personally reviewed the following studies and agree with the findings     Recent Radiology Findings:       Recent Lab Findings: Lab Results  Component Value Date   WBC 5.4 10/27/2022   HGB 11.2 (L) 11/02/2022   HCT 33.0 (L) 11/02/2022   PLT 236 10/27/2022   GLUCOSE 116 (H) 10/27/2022   CHOL 158 12/13/2018   TRIG 133 12/13/2018   HDL 51 12/13/2018   LDLCALC 80 12/13/2018   ALT 17 10/28/2021   AST 32 10/28/2021   NA 140 11/02/2022   K 3.7 11/02/2022   CL 104 10/27/2022   CREATININE 0.99 10/27/2022   BUN 10 10/27/2022   CO2 20 10/27/2022   TSH 1.776 12/13/2018   INR 1.0 02/03/2009   HGBA1C 5.4 12/13/2018      Assessment / Plan:     67 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 She is hopeful it will be soon. 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 She will see Cardiology in meantime of her arranging her housing issue. She will call if her symptoms worsen.   I have spent 30 min in review of the records, viewing studies and in face to face with patient and in coordination of future care    Eugenio Hoes 01/07/2023 11:22 AM

## 2023-01-08 ENCOUNTER — Encounter: Payer: Self-pay | Admitting: Thoracic Surgery (Cardiothoracic Vascular Surgery)

## 2023-01-08 ENCOUNTER — Ambulatory Visit (INDEPENDENT_AMBULATORY_CARE_PROVIDER_SITE_OTHER): Payer: 59 | Admitting: Thoracic Surgery (Cardiothoracic Vascular Surgery)

## 2023-01-08 VITALS — BP 131/76 | HR 92 | Resp 20 | Ht 63.0 in | Wt 178.0 lb

## 2023-01-08 DIAGNOSIS — I051 Rheumatic mitral insufficiency: Secondary | ICD-10-CM

## 2023-01-08 NOTE — Patient Instructions (Signed)
Schedule MVR/TV repair once her housing issue resolved

## 2023-01-10 NOTE — Telephone Encounter (Signed)
done

## 2023-01-23 ENCOUNTER — Encounter: Payer: Self-pay | Admitting: Physician Assistant

## 2023-01-23 NOTE — Progress Notes (Unsigned)
Cardiology Office Note    Date:  01/24/2023  ID:  Rose [name], DOB 1955/07/10, MRN [id number] PCP:  Cristino Martes, NP  Cardiologist:  Donato Schultz, MD  Electrophysiologist:  None   Chief Complaint: f/u MV disease  History of Present Illness: .    Rose [name] is a 67 y.o. female with visit-pertinent history of mitral stenosis, mitral regurgitation, tricuspid regurgitation,  mild CAD, fibromyalgia, HA, COPD, esophageal dilation, fatty liver, DM, HLD, sciatica, stroke, OSA not using CPAP, pancreatitis seen for post-cath follow-up. She was previously followed by [facility]. Prior nuc 2022 was normal except with increased RV uptake. Echo had shown severe MS. Carotid duplex 2022 showed tortuosity of the left carotid bruit but no significant disease. There was initially plan for further workup of mitral valve disease following GI clearance given esophageal dilation history, but then lost to follow-up. She was recently re-evaluated by Dr. [name] for severe mitral stenosis 09/2022 with plan for TEE and cath. TEE showed severe MR and severe MS with rheumatic appearance, EF 60-65%,  severe LAE, moderate RAE, severe TR, trivial AI, abnormal left upper pulmonary vein. Cardiac cath showed mild nonobstructive plaquing in the RCA, no stenosis otherwise, low cardiac output based on Fick measurement (question accuracy as patient seems well-compensated), hemodynamic findings c/w severe MS/MR. She saw Dr. Leafy Ro 01/08/23 with plans for valve surgery after her housing situation has stabilized. She also is known to have a nickel allergy so they will be arranging other sternal closure device for her.  She is seen for follow-up overall stable from cardiac standpoint. She reports chronic DOE with activities like vacuuming. No chest pain, palpitations, edema or syncope reported. She has numerous chronic MSK complaints in the setting of fibromyalgia. 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 Favor benign histology (such as Warthin's tumor or BMT) as the dominant 1.7 cm lesion is only 1-2 mm larger since a 2019 cervical spine CT." She reports that she previously had to report her PCP to the medical board because she did not think he was doing his job. She now follows with [facility]. She reports a disagreement with the leasing office where she lives and that she is going to be evicted in October. She informed our CMA that she had an emotional support dog that was reported to have bit someone and she won't be allowed to live there much longer.   Labwork independently reviewed: 10/2022 istat Hgb 11.2, K 3.7 09/2022 K 3.9, Cr 0.99, LFTs ok except alk phos 126, TSH OK, A1c 6.1 2020 LDL 80, trig 133  ROS: .    Please see the history of present illness. All other systems are reviewed and otherwise negative.  Studies Reviewed: Marland Kitchen    EKG:  EKG is ordered today, personally reviewed, demonstrating NSR 90bpm, nonspecific TW avL -no acute STT Changes  CV Studies: Cardiac studies reviewed are outlined and summarized above. Otherwise please see EMR for full report.   Current Reported Medications:.    Current Meds  Medication Sig   albuterol (VENTOLIN HFA) 108 (90 Base) MCG/ACT inhaler inhale 2 PUFFS into THE lungs EVERY 6 HOURS AS NEEDED FOR WHEEZING OR SHORTNESS OF BREATH (Patient taking differently: Inhale 2 puffs into the lungs every 6 (six) hours as needed for shortness of breath or wheezing.)  aspirin EC 81 MG tablet Take 1 tablet (81 mg total) by mouth daily. Swallow whole.   clonazePAM (KLONOPIN) 1 MG tablet Take 1 mg by mouth in the morning, at noon, and at bedtime.   clotrimazole-betamethasone (LOTRISONE) cream Apply 1 application topically 2 (two) times daily.   gabapentin (NEURONTIN) 600 MG  tablet Take 600 mg by mouth 3 (three) times daily as needed (muscle spasms).   metFORMIN (GLUCOPHAGE) 500 MG tablet Take 500 mg by mouth 2 (two) times daily.   methocarbamol (ROBAXIN) 500 MG tablet Take 1 tablet (500 mg total) by mouth every 8 (eight) hours as needed for muscle spasms.   Multiple Vitamins-Minerals (CENTRUM ADULTS PO) Take 1 tablet by mouth daily.   omeprazole (PRILOSEC) 40 MG capsule Take 40 mg by mouth daily.   rosuvastatin (CRESTOR) 40 MG tablet Take 1 tablet (40 mg total) by mouth at bedtime.   traZODone (DESYREL) 50 MG tablet Take 75 mg by mouth at bedtime. Pt takes 1and 1/2 tablet at bedtime.   TRELEGY ELLIPTA 200-62.5-25 MCG/ACT AEPB SMARTSIG:1 Puff(s) Via Inhaler Every Morning    Physical Exam:    VS:  BP 124/70   Pulse 90   Ht 5\' 3"  (1.6 m)   Wt 182 lb 3.2 oz (82.6 kg)   SpO2 94%   BMI 32.28 kg/m    Wt Readings from Last 3 Encounters:  01/24/23 182 lb 3.2 oz (82.6 kg)  01/08/23 178 lb (80.7 kg)  11/13/22 178 lb (80.7 kg)    GEN: Well nourished, well developed in no acute distress NECK: No JVD; No carotid bruits CARDIAC: RRR, no murmurs, rubs, gallops RESPIRATORY:  Clear to auscultation without rales, wheezing or rhonchi  ABDOMEN: Soft, non-tender, non-distended EXTREMITIES:  No edema; No acute deformity, right radial cath site with minor ecchymosis, no bruit or hematoma, good pulse.   Asessement and Plan:.    1. Severe MS/MR with tricuspid regurgitation - in need of valve surgery. Dr. Leafy Ro recommended to stabilize her housing situation prior to proceeding with surgery. He recommends MV replacement and TV repair. He states "She however needs to make arrangements for her housing and her dog. She is going to work on that over the next several weeks. She will then call or I have at least scheduled a follow up appt in mid August to see where she is at able to schedule surgery from there." She otherwise presently appears compensated on examination. We provided  her information on housing resources as she is being evicted in October. We will also refer to Child psychotherapist. She is agreeable to this. Discussed prompt treatment of GAS infections going forward due to presumed h/o rheumatic heart disease. She also plans to establish with dentist. She also relays this concern of possible tumor in her ear. See CT findings above. I discussed with Dr. [name] and he agrees she would not be a candidate for surgery under anesthesia at this time. She inquired about having a biopsy done. Per discussion with MD, could do with local approach but would avoid any procedure that requires her to be sedated or under anesthesia due to her severe untreated valvular disease. We have not received any clearance requests inquiring about this.  2. Mild CAD with HLD - continue ASA. She reports she has been on rosuvastatin for many years and that lipids are now followed by [facility]. Have asked RN to get copy of labs for our records since Dr. [name] is last prescribing provider on this.  3. SDOH - refer to Child psychotherapist. Main issue is transportation. She has [employer] and also utilizes their services for transportation.  Disposition: F/u with Dr. [name] or me in 3-4 months. Patient aware to notify Dr. Karolee Ohs office when housing situation stabilizes.  Signed, Laurann Montana, PA-C

## 2023-01-24 ENCOUNTER — Ambulatory Visit: Payer: 59 | Attending: Physician Assistant | Admitting: Physician Assistant

## 2023-01-24 ENCOUNTER — Encounter: Payer: Self-pay | Admitting: Physician Assistant

## 2023-01-24 VITALS — BP 124/70 | HR 90 | Ht 63.0 in | Wt 182.2 lb

## 2023-01-24 DIAGNOSIS — E785 Hyperlipidemia, unspecified: Secondary | ICD-10-CM

## 2023-01-24 DIAGNOSIS — Z139 Encounter for screening, unspecified: Secondary | ICD-10-CM

## 2023-01-24 DIAGNOSIS — I050 Rheumatic mitral stenosis: Secondary | ICD-10-CM

## 2023-01-24 DIAGNOSIS — I099 Rheumatic heart disease, unspecified: Secondary | ICD-10-CM | POA: Diagnosis not present

## 2023-01-24 DIAGNOSIS — I071 Rheumatic tricuspid insufficiency: Secondary | ICD-10-CM | POA: Diagnosis not present

## 2023-01-24 DIAGNOSIS — I340 Nonrheumatic mitral (valve) insufficiency: Secondary | ICD-10-CM

## 2023-01-24 DIAGNOSIS — I2510 Atherosclerotic heart disease of native coronary artery without angina pectoris: Secondary | ICD-10-CM

## 2023-01-24 NOTE — Patient Instructions (Signed)
Medication Instructions:  Your physician recommends that you continue on your current medications as directed. Please refer to the Current Medication list given to you today.  *If you need a refill on your cardiac medications before your next appointment, please call your pharmacy*  Lab Work: If you have labs (blood work) drawn today and your tests are completely normal, you will receive your results only by: MyChart Message (if you have MyChart) OR A paper copy in the mail If you have any lab test that is abnormal or we need to change your treatment, we will call you to review the results.  Testing/Procedures: None ordered today.  Follow-Up: At [facility], you and your health needs are our priority.  As part of our continuing mission to provide you with exceptional heart care, we have created designated Provider Care Teams.  These Care Teams include your primary Cardiologist (physician) and Advanced Practice Providers (APPs -  Physician Assistants and Nurse Practitioners) who all work together to provide you with the care you need, when you need it.  We recommend signing up for the patient portal called "MyChart".  Sign up information is provided on this After Visit Summary.  MyChart is used to connect with patients for Virtual Visits (Telemedicine).  Patients are able to view lab/test results, encounter notes, upcoming appointments, etc.  Non-urgent messages can be sent to your provider as well.   To learn more about what you can do with MyChart, go to [email or web address].    Your next appointment:   3 to 4 month(s)  Provider:   Donato Schultz, MD or Ronie Spies PA

## 2023-01-25 ENCOUNTER — Telehealth (HOSPITAL_COMMUNITY): Payer: Self-pay | Admitting: Licensed Clinical Social Worker

## 2023-01-25 NOTE — Progress Notes (Signed)
Heart and Vascular Care Navigation  01/25/2023  Rose [name] [birth year] [id number]  Reason for Referral: Housing and transportation concerns   Engaged with patient by telephone for initial visit for Heart and Vascular Care Coordination.                                                                                                   Assessment:   CSW called pt to discuss current concerns.  Pt reports that her main concern at this time is her pending eviction at the end of October.  Reports that apartment site has used false complaints against her for harassment and her dog biting another resident to force her to move.  She reports she pays her rent an that the eviction is purely based on these complaints.  States she had a court date at the beginning of the year and that they delayed eviction for her until Oct 31st.  She currently pays $752/month in rent and only makes $1,025.  States this has made it very hard to find alternative housing as most apartments require at least first month rent and security deposit and often require that the income is 3x the rent to approve.  She is on the waitlist for Section 8 housing (has been for 2 years) and has looked into income based housing but did not put her name on any list due to long waitlists.  CSW emailed [employer] eviction mediation program to see how they could assist this patient.  Pt reports that she does receive case management/TCM through her [facility] and they are supposed to be helping her with this.  States it is through an agency called [employer].  CSW called and left VM to discuss and coordinate care as well as request PCS eval as patient reports she would benefit from in home assistance.                                   HRT/VAS Care Coordination     Outpatient Care Team Social Worker; Community ACT Team   Community ACT Team Member Name: [facility]   Social Worker Name: Rosetta Posner, Heart and Vascular, [phone]   Living arrangements for  the past 2 months Apartment   Lives with: Self   Patient Current Insurance Coverage Medicaid; Managed Medicare   Patient Has Concern With Paying Medical Bills No   Does Patient Have Prescription Coverage? Yes   Home Assistive Devices/Equipment Cane (specify quad or straight)  straight       Social History:                                                                             SDOH Screenings   Housing: Medium Risk (01/25/2023)  Transportation  Needs: No Transportation Needs (01/25/2023)  Alcohol Screen: Low Risk  (12/13/2018)  Financial Resource Strain: Medium Risk (01/25/2023)  Social Connections: Unknown (05/17/2022)   Received from [facility], [facility]  Tobacco Use: High Risk (01/24/2023)    SDOH Interventions: Financial Resources:    Receives $1,025/month in disability, also has U card of about $300 for [employer] and Administrator Insecurity:  None reported  Housing Insecurity:  Housing Interventions: Other (Comment) (referral to [facility])- [facility] referral to assess for possible legal aid involvement  Transportation:   Transportation Interventions: Intervention Not Indicated utilizes insurance transportation    Follow-up plan:    CSW to follow up with [employer] and [facility] to help coordinate care.   Burna Sis, LCSW Clinical Social Worker [facility] Desk#: [phone] Cell#: [phone]

## 2023-02-08 ENCOUNTER — Encounter: Payer: Self-pay | Admitting: *Deleted

## 2023-02-08 ENCOUNTER — Other Ambulatory Visit: Payer: Self-pay | Admitting: *Deleted

## 2023-02-08 ENCOUNTER — Telehealth (HOSPITAL_COMMUNITY): Payer: Self-pay | Admitting: Licensed Clinical Social Worker

## 2023-02-08 DIAGNOSIS — I071 Rheumatic tricuspid insufficiency: Secondary | ICD-10-CM

## 2023-02-08 DIAGNOSIS — I051 Rheumatic mitral insufficiency: Secondary | ICD-10-CM

## 2023-02-08 NOTE — Telephone Encounter (Signed)
H&V Care Navigation CSW Progress Note  Clinical Social Worker called pt to check in regarding housing situation.  Reports that she is still without a plan for the end of October when her eviction is.  Continues to have problems finding affordable housing.  CSW had heard back from [facility] that they had attempted contact but pt reports she has not spoken to them- CSW reached back out to them.  Attempted to contact Legal Aid on her behalf but unable to speak with a representative- will attempt again tomorrow.   SDOH Screenings   Housing: Medium Risk (01/25/2023)  Transportation Needs: No Transportation Needs (01/25/2023)  Alcohol Screen: Low Risk  (12/13/2018)  Financial Resource Strain: Medium Risk (01/25/2023)  Social Connections: Unknown (05/17/2022)   Received from [facility], [facility]  Tobacco Use: High Risk (01/24/2023)   Burna Sis, LCSW Clinical Social Worker [facility] Desk#: [phone] Cell#: [phone]

## 2023-02-09 ENCOUNTER — Telehealth (HOSPITAL_COMMUNITY): Payer: Self-pay | Admitting: Licensed Clinical Social Worker

## 2023-02-09 NOTE — Telephone Encounter (Signed)
H&V Care Navigation CSW Progress Note  Clinical Social Worker confirmed with [employer] that they have sent resources to patient to follow up on.  CSW called [employer] to help figure out TCM- they report that patient became unenrolled with services from [place] so they would need to sign her back up.  CSW explained patient current needs and case worker decided to set up with [facility] services directly- patient provided consent and understanding of this.  [employer] case management won't become active till Oct 1st so they will reach out at that time.   SDOH Screenings   Housing: Medium Risk (01/25/2023)  Transportation Needs: No Transportation Needs (01/25/2023)  Alcohol Screen: Low Risk  (12/13/2018)  Financial Resource Strain: Medium Risk (01/25/2023)  Social Connections: Unknown (05/17/2022)   Received from [facility]  Tobacco Use: High Risk (01/24/2023)    Burna Sis, LCSW Clinical Social Worker [facility] Desk#: [phone] Cell#: [phone]

## 2023-03-01 ENCOUNTER — Telehealth (HOSPITAL_COMMUNITY): Payer: Self-pay | Admitting: Licensed Clinical Social Worker

## 2023-03-01 NOTE — Telephone Encounter (Signed)
H&V Care Navigation CSW Progress Note  Clinical Social Worker called [employer] to confirm she is now under case management with them.  They confirm that she is under their care but not assigned to a case manager yet. CSW completed case manager referral and they placed her as urgent need to help with housing due to upcoming medical procedure.  Ref # [id number] for the call   SDOH Screenings   Housing: Medium Risk (01/25/2023)  Transportation Needs: No Transportation Needs (01/25/2023)  Alcohol Screen: Low Risk  (12/13/2018)  Financial Resource Strain: Medium Risk (01/25/2023)  Social Connections: Unknown (05/17/2022)   Received from [facility], [facility]  Tobacco Use: High Risk (01/24/2023)    Burna Sis, LCSW Clinical Social Worker [facility] Desk#: [phone] Cell#: [phone]

## 2023-03-05 ENCOUNTER — Other Ambulatory Visit: Payer: 59

## 2023-03-26 ENCOUNTER — Encounter (HOSPITAL_COMMUNITY): Payer: Self-pay | Admitting: Anesthesiology

## 2023-03-26 ENCOUNTER — Telehealth (HOSPITAL_COMMUNITY): Payer: Self-pay | Admitting: Licensed Clinical Social Worker

## 2023-03-26 NOTE — Telephone Encounter (Signed)
H&V Care Navigation CSW Progress Note  Clinical Social Worker called pt to check in on status of her housing.  States she has a IT consultant and has an appt with an apartment site tomorrow so is no longer worried about locating housing.  Biggest barrier to going through with surgery now she reports is she has no one to care for her dog.  States her nieces was going to watch the dog but has had a medical event since and doesn't feel as if she can care for them.  Patient reports she has no other family or friends and can't afford to pay for boarding for the dog for such a long period of time- would have to go to SNF following surgery as she has no one to be with her.  CSW calling different animal shelters and organizations in the area to inquire about assistance but no leads at this time.  Spoke to niece directly who confirms that she is not able to watch the dog- mentioned a daughter that the patient has but patient reports they aren't talking at the dtr has cats so the dog would be unable to go there.  CSW will continue to look into alternative options.   SDOH Screenings   Housing: Medium Risk (01/25/2023)  Transportation Needs: No Transportation Needs (01/25/2023)  Alcohol Screen: Low Risk  (12/13/2018)  Financial Resource Strain: Medium Risk (01/25/2023)  Social Connections: Unknown (05/17/2022)   [facility]  Tobacco Use: High Risk (01/24/2023)    Burna Sis, LCSW Clinical Social Worker [facility] Desk#: [phone] Cell#: [phone]

## 2023-03-26 NOTE — Pre-Procedure Instructions (Signed)
Surgical Instructions   Your procedure is scheduled on March 29, 2023. Report to [facility] Main Entrance "A" at 5:30 A.M., then check in with the Admitting office. Any questions or running late day of surgery: call [phone]  Questions prior to your surgery date: call [phone], Monday-Friday, 8am-4pm. If you experience any cold or flu symptoms such as cough, fever, chills, shortness of breath, etc. between now and your scheduled surgery, please notify us at the above number.     Remember:  Do not eat or drink after midnight the night before your surgery    Take these medicines the morning of surgery with A SIP OF WATER: clonazePAM (KLONOPIN)  gabapentin (NEURONTIN)  omeprazole (PRILOSEC)  sertraline (ZOLOFT)    May take these medicines IF NEEDED: albuterol (VENTOLIN HFA) inhaler - please being inhaler with you morning of surgery methocarbamol (ROBAXIN)  ondansetron (ZOFRAN)    Continue taking your Aspirin through the day before surgery. DO NOT take any the morning of surgery.   One week prior to surgery, STOP taking any Aleve, Naproxen, Ibuprofen, Motrin, Advil, Goody's, BC's, all herbal medications, fish oil, and non-prescription vitamins. This includes your medication: meloxicam (MOBIC)    WHAT DO I DO ABOUT MY DIABETES MEDICATION?   STOP taking your metFORMIN (GLUCOPHAGE)  two days prior to surgery. Your last dose will be October 28th.    HOW TO MANAGE YOUR DIABETES BEFORE AND AFTER SURGERY  Why is it important to control my blood sugar before and after surgery? Improving blood sugar levels before and after surgery helps healing and can limit problems. A way of improving blood sugar control is eating a healthy diet by:  Eating less sugar and carbohydrates  Increasing activity/exercise  Talking with your doctor about reaching your blood sugar goals High blood sugars (greater than 180 mg/dL) can raise your risk of infections and slow your recovery, so you  will need to focus on controlling your diabetes during the weeks before surgery. Make sure that the doctor who takes care of your diabetes knows about your planned surgery including the date and location.  How do I manage my blood sugar before surgery? Check your blood sugar at least 4 times a day, starting 2 days before surgery, to make sure that the level is not too high or low.  Check your blood sugar the morning of your surgery when you wake up and every 2 hours until you get to the Short Stay unit.  If your blood sugar is less than 70 mg/dL, you will need to treat for low blood sugar: Do not take insulin. Treat a low blood sugar (less than 70 mg/dL) with  cup of clear juice (cranberry or apple), 4 glucose tablets, OR glucose gel. Recheck blood sugar in 15 minutes after treatment (to make sure it is greater than 70 mg/dL). If your blood sugar is not greater than 70 mg/dL on recheck, call [phone] for further instructions. Report your blood sugar to the short stay nurse when you get to Short Stay.  If you are admitted to the hospital after surgery: Your blood sugar will be checked by the staff and you will probably be given insulin after surgery (instead of oral diabetes medicines) to make sure you have good blood sugar levels. The goal for blood sugar control after surgery is 80-180 mg/dL.                      Do NOT Smoke (Tobacco/Vaping) for 24 hours  prior to your procedure.  If you use a CPAP at night, you may bring your mask/headgear for your overnight stay.   You will be asked to remove any contacts, glasses, piercing's, hearing aid's, dentures/partials prior to surgery. Please bring cases for these items if needed.    Patients discharged the day of surgery will not be allowed to drive home, and someone needs to stay with them for 24 hours.  SURGICAL WAITING ROOM VISITATION Patients may have no more than 2 support people in the waiting area - these visitors may rotate.    Pre-op nurse will coordinate an appropriate time for 1 ADULT support person, who may not rotate, to accompany patient in pre-op.  Children under the age of 71 must have an adult with them who is not the patient and must remain in the main waiting area with an adult.  If the patient needs to stay at the hospital during part of their recovery, the visitor guidelines for inpatient rooms apply.  Please refer to the [facility] website for the visitor guidelines for any additional information.   If you received a COVID test during your pre-op visit  it is requested that you wear a mask when out in public, stay away from anyone that may not be feeling well and notify your surgeon if you develop symptoms. If you have been in contact with anyone that has tested positive in the last 10 days please notify you surgeon.      Pre-operative CHG Bathing Instructions   You can play a key role in reducing the risk of infection after surgery. Your skin needs to be as free of germs as possible. You can reduce the number of germs on your skin by washing with CHG (chlorhexidine gluconate) soap before surgery. CHG is an antiseptic soap that kills germs and continues to kill germs even after washing.   DO NOT use if you have an allergy to chlorhexidine/CHG or antibacterial soaps. If your skin becomes reddened or irritated, stop using the CHG and notify one of our RNs at [phone].              TAKE A SHOWER THE NIGHT BEFORE SURGERY AND THE DAY OF SURGERY    Please keep in mind the following:  DO NOT shave, including legs and underarms, 48 hours prior to surgery.   You may shave your face before/day of surgery.  Place clean sheets on your bed the night before surgery Use a clean washcloth (not used since being washed) for each shower. DO NOT sleep with pet's night before surgery.  CHG Shower Instructions:  Wash your face and private area with normal soap. If you choose to wash your hair, wash first with  your normal shampoo.  After you use shampoo/soap, rinse your hair and body thoroughly to remove shampoo/soap residue.  Turn the water OFF and apply half the bottle of CHG soap to a CLEAN washcloth.  Apply CHG soap ONLY FROM YOUR NECK DOWN TO YOUR TOES (washing for 3-5 minutes)  DO NOT use CHG soap on face, private areas, open wounds, or sores.  Pay special attention to the area where your surgery is being performed.  If you are having back surgery, having someone wash your back for you may be helpful. Wait 2 minutes after CHG soap is applied, then you may rinse off the CHG soap.  Pat dry with a clean towel  Put on clean pajamas    Additional instructions for the day  of surgery: DO NOT APPLY any lotions, deodorants, cologne, or perfumes.   Do not wear jewelry or makeup Do not wear nail polish, gel polish, artificial nails, or any other type of covering on natural nails (fingers and toes) Do not bring valuables to the hospital. [facility] is not responsible for valuables/personal belongings. Put on clean/comfortable clothes.  Please brush your teeth.  Ask your nurse before applying any prescription medications to the skin.

## 2023-03-27 ENCOUNTER — Other Ambulatory Visit: Payer: Self-pay | Admitting: *Deleted

## 2023-03-27 ENCOUNTER — Inpatient Hospital Stay (HOSPITAL_COMMUNITY): Admission: RE | Admit: 2023-03-27 | Payer: 59 | Source: Ambulatory Visit

## 2023-03-27 ENCOUNTER — Ambulatory Visit (HOSPITAL_COMMUNITY): Admission: RE | Admit: 2023-03-27 | Payer: 59 | Source: Ambulatory Visit

## 2023-03-27 ENCOUNTER — Inpatient Hospital Stay (HOSPITAL_COMMUNITY)
Admission: RE | Admit: 2023-03-27 | Discharge: 2023-03-27 | Disposition: A | Discharge status: Home / Self Care | Payer: 59 | Source: Ambulatory Visit

## 2023-03-27 NOTE — Progress Notes (Signed)
Pt did not show for PAT appointment. RN called pt and pt stated that she does not have anyone to take care of her service dog while she has surgery, so she was going to cancel. Darius Bump, RN with TCTS office made aware and will contact pt regarding rescheduling.

## 2023-03-29 ENCOUNTER — Inpatient Hospital Stay (HOSPITAL_COMMUNITY)
Admission: RE | Admit: 2023-03-29 | Payer: 59 | Source: Home / Self Care | Admitting: Thoracic Surgery (Cardiothoracic Vascular Surgery)

## 2023-03-29 ENCOUNTER — Encounter (HOSPITAL_COMMUNITY): Admission: RE | Payer: Self-pay | Source: Home / Self Care

## 2023-03-29 SURGERY — REPLACEMENT, MITRAL VALVE
Anesthesia: General | Site: Chest

## 2023-04-10 ENCOUNTER — Other Ambulatory Visit: Payer: Self-pay | Admitting: *Deleted

## 2023-04-10 ENCOUNTER — Encounter: Payer: Self-pay | Admitting: Physician Assistant

## 2023-04-10 DIAGNOSIS — I071 Rheumatic tricuspid insufficiency: Secondary | ICD-10-CM

## 2023-04-10 DIAGNOSIS — I051 Rheumatic mitral insufficiency: Secondary | ICD-10-CM

## 2023-04-10 NOTE — Progress Notes (Signed)
Received update from TCTS coordinator Darius Bump that patient has pre-op appts on 12/11 when she was intended to f/u with me - patient proceeding with mitral valve surgery 12/13 and does not necessarily need appointment with [place] beforehand. Alycia Rossetti will check in with her to ensure no new concerns, but otherwise we will move this to 12/30 with Robin Searing NP as a post-op appt. (I have no availability later in Dec.) Alycia Rossetti will relay appt update to patient and ask her to notify us if any concerns between now and procedure. Cardiology is available for any concerns in the interim.

## 2023-04-11 ENCOUNTER — Encounter: Payer: Self-pay | Admitting: *Deleted

## 2023-05-08 NOTE — Progress Notes (Signed)
Surgical Instructions   Your procedure is scheduled on December 13, 24. Report to [facility] Main Entrance "A" at 5:30 A.M., then check in with the Admitting office. Any questions or running late day of surgery: call [phone]  Questions prior to your surgery date: call [phone], Monday-Friday, 8am-4pm. If you experience any cold or flu symptoms such as cough, fever, chills, shortness of breath, etc. between now and your scheduled surgery, please notify us at the above number.     Remember:  Do not eat after midnight the night before your surgery   You may drink clear liquids until 4:30 the morning of your surgery.   Clear liquids allowed are: Water, Non-Citrus Juices (without pulp), Carbonated Beverages, Clear Tea (no milk, honey, etc.), Black Coffee Only (NO MILK, CREAM OR POWDERED CREAMER of any kind), and Gatorade.    Take these medicines the morning of surgery with A SIP OF WATER   clonazePAM (KLONOPIN)  gabapentin (NEURONTIN)  omeprazole (PRILOSEC)  sertraline (ZOLOFT)   May take these medicines IF NEEDED:  albuterol (VENTOLIN HFA)  methocarbamol (ROBAXIN)  ondansetron (ZOFRAN)  traZODone (DESYREL)    Stop taking your meloxicam (MOBIC) one week prior to surgery.  Last dose should be on 05-04-23.  Follow your surgeon's instructions on when to stop Asprin.  If no instructions were given by your surgeon then you will need to call the office to get those instructions.     WHAT DO I DO ABOUT MY DIABETES MEDICATION?   Do not take metFORMIN (GLUCOPHAGE) the morning of surgery.  HOW TO MANAGE YOUR DIABETES BEFORE AND AFTER SURGERY  Why is it important to control my blood sugar before and after surgery? Improving blood sugar levels before and after surgery helps healing and can limit problems. A way of improving blood sugar control is eating a healthy diet by:  Eating less sugar and carbohydrates  Increasing activity/exercise  Talking with your doctor about  reaching your blood sugar goals High blood sugars (greater than 180 mg/dL) can raise your risk of infections and slow your recovery, so you will need to focus on controlling your diabetes during the weeks before surgery. Make sure that the doctor who takes care of your diabetes knows about your planned surgery including the date and location.  How do I manage my blood sugar before surgery? Check your blood sugar at least 4 times a day, starting 2 days before surgery, to make sure that the level is not too high or low.  Check your blood sugar the morning of your surgery when you wake up and every 2 hours until you get to the Short Stay unit.  If your blood sugar is less than 70 mg/dL, you will need to treat for low blood sugar: Do not take insulin. Treat a low blood sugar (less than 70 mg/dL) with  cup of clear juice (cranberry or apple), 4 glucose tablets, OR glucose gel. Recheck blood sugar in 15 minutes after treatment (to make sure it is greater than 70 mg/dL). If your blood sugar is not greater than 70 mg/dL on recheck, call [phone] for further instructions. Report your blood sugar to the short stay nurse when you get to Short Stay.  If you are admitted to the hospital after surgery: Your blood sugar will be checked by the staff and you will probably be given insulin after surgery (instead of oral diabetes medicines) to make sure you have good blood sugar levels. The goal for blood sugar control after surgery  is 80-180 mg/dL.   One week prior to surgery, STOP taking any Aspirin (unless otherwise instructed by your surgeon) Aleve, Naproxen, Ibuprofen, Motrin, Advil, Goody's, BC's, all herbal medications, fish oil, and non-prescription vitamins.                     Do NOT Smoke (Tobacco/Vaping) for 24 hours prior to your procedure.  If you use a CPAP at night, you may bring your mask/headgear for your overnight stay.   You will be asked to remove any contacts, glasses, piercing's,  hearing aid's, dentures/partials prior to surgery. Please bring cases for these items if needed.    Patients discharged the day of surgery will not be allowed to drive home, and someone needs to stay with them for 24 hours.  SURGICAL WAITING ROOM VISITATION Patients may have no more than 2 support people in the waiting area - these visitors may rotate.   Pre-op nurse will coordinate an appropriate time for 1 ADULT support person, who may not rotate, to accompany patient in pre-op.  Children under the age of 70 must have an adult with them who is not the patient and must remain in the main waiting area with an adult.  If the patient needs to stay at the hospital during part of their recovery, the visitor guidelines for inpatient rooms apply.  Please refer to the [facility] website for the visitor guidelines for any additional information.   If you received a COVID test during your pre-op visit  it is requested that you wear a mask when out in public, stay away from anyone that may not be feeling well and notify your surgeon if you develop symptoms. If you have been in contact with anyone that has tested positive in the last 10 days please notify you surgeon.      Pre-operative CHG Bathing Instructions   You can play a key role in reducing the risk of infection after surgery. Your skin needs to be as free of germs as possible. You can reduce the number of germs on your skin by washing with CHG (chlorhexidine gluconate) soap before surgery. CHG is an antiseptic soap that kills germs and continues to kill germs even after washing.   DO NOT use if you have an allergy to chlorhexidine/CHG or antibacterial soaps. If your skin becomes reddened or irritated, stop using the CHG and notify one of our RNs at [phone].              TAKE A SHOWER THE NIGHT BEFORE SURGERY AND THE DAY OF SURGERY    Please keep in mind the following:  DO NOT shave, including legs and underarms, 48 hours prior to  surgery.   You may shave your face before/day of surgery.  Place clean sheets on your bed the night before surgery Use a clean washcloth (not used since being washed) for each shower. DO NOT sleep with pet's night before surgery.  CHG Shower Instructions:  Wash your face and private area with normal soap. If you choose to wash your hair, wash first with your normal shampoo.  After you use shampoo/soap, rinse your hair and body thoroughly to remove shampoo/soap residue.  Turn the water OFF and apply half the bottle of CHG soap to a CLEAN washcloth.  Apply CHG soap ONLY FROM YOUR NECK DOWN TO YOUR TOES (washing for 3-5 minutes)  DO NOT use CHG soap on face, private areas, open wounds, or sores.  Pay special attention  to the area where your surgery is being performed.  If you are having back surgery, having someone wash your back for you may be helpful. Wait 2 minutes after CHG soap is applied, then you may rinse off the CHG soap.  Pat dry with a clean towel  Put on clean pajamas    Additional instructions for the day of surgery: DO NOT APPLY any lotions, deodorants, cologne, or perfumes.   Do not wear jewelry or makeup Do not wear nail polish, gel polish, artificial nails, or any other type of covering on natural nails (fingers and toes) Do not bring valuables to the hospital. [facility] is not responsible for valuables/personal belongings. Put on clean/comfortable clothes.  Please brush your teeth.  Ask your nurse before applying any prescription medications to the skin.

## 2023-05-09 ENCOUNTER — Other Ambulatory Visit: Payer: Self-pay

## 2023-05-09 ENCOUNTER — Ambulatory Visit (HOSPITAL_COMMUNITY)
Admission: RE | Admit: 2023-05-09 | Discharge: 2023-05-09 | Disposition: A | Discharge status: Home / Self Care | Payer: 59 | Source: Ambulatory Visit | Attending: Anesthesiology | Admitting: Anesthesiology

## 2023-05-09 ENCOUNTER — Ambulatory Visit (HOSPITAL_COMMUNITY)
Admission: RE | Admit: 2023-05-09 | Discharge: 2023-05-09 | Disposition: A | Discharge status: Home / Self Care | Payer: 59 | Source: Ambulatory Visit | Attending: Thoracic Surgery (Cardiothoracic Vascular Surgery) | Admitting: Thoracic Surgery (Cardiothoracic Vascular Surgery)

## 2023-05-09 ENCOUNTER — Ambulatory Visit: Payer: 59 | Admitting: Physician Assistant

## 2023-05-09 ENCOUNTER — Inpatient Hospital Stay (HOSPITAL_COMMUNITY)
Admission: RE | Admit: 2023-05-09 | Discharge: 2023-05-09 | Disposition: A | Discharge status: Home / Self Care | Payer: 59 | Source: Ambulatory Visit | Attending: Thoracic Surgery (Cardiothoracic Vascular Surgery)

## 2023-05-09 ENCOUNTER — Ambulatory Visit (HOSPITAL_BASED_OUTPATIENT_CLINIC_OR_DEPARTMENT_OTHER)
Admission: RE | Admit: 2023-05-09 | Discharge: 2023-05-09 | Disposition: A | Discharge status: Home / Self Care | Payer: 59 | Source: Ambulatory Visit | Attending: Thoracic Surgery (Cardiothoracic Vascular Surgery) | Admitting: Thoracic Surgery (Cardiothoracic Vascular Surgery)

## 2023-05-09 ENCOUNTER — Encounter (HOSPITAL_COMMUNITY): Payer: Self-pay

## 2023-05-09 VITALS — BP 123/70 | HR 68 | Temp 98.3°F | Resp 18 | Ht 62.0 in | Wt 183.0 lb

## 2023-05-09 DIAGNOSIS — I341 Nonrheumatic mitral (valve) prolapse: Secondary | ICD-10-CM | POA: Diagnosis not present

## 2023-05-09 DIAGNOSIS — Z01818 Encounter for other preprocedural examination: Secondary | ICD-10-CM | POA: Insufficient documentation

## 2023-05-09 DIAGNOSIS — I361 Nonrheumatic tricuspid (valve) insufficiency: Secondary | ICD-10-CM

## 2023-05-09 DIAGNOSIS — I051 Rheumatic mitral insufficiency: Secondary | ICD-10-CM

## 2023-05-09 DIAGNOSIS — I071 Rheumatic tricuspid insufficiency: Secondary | ICD-10-CM

## 2023-05-09 DIAGNOSIS — I340 Nonrheumatic mitral (valve) insufficiency: Secondary | ICD-10-CM

## 2023-05-09 LAB — PULMONARY FUNCTION TEST
DL/VA % pred: 40 %
DL/VA: 1.72 ml/min/mmHg/L
DLCO unc % pred: 47 %
DLCO unc: 8.83 ml/min/mmHg
FEF 25-75 Post: 0.82 L/s
FEF 25-75 Pre: 0.77 L/s
FEF2575-%Change-Post: 6 %
FEF2575-%Pred-Post: 41 %
FEF2575-%Pred-Pre: 39 %
FEV1-%Change-Post: 1 %
FEV1-%Pred-Post: 80 %
FEV1-%Pred-Pre: 79 %
FEV1-Post: 1.78 L
FEV1-Pre: 1.75 L
FEV1FVC-%Change-Post: 2 %
FEV1FVC-%Pred-Pre: 76 %
FEV6-%Change-Post: 0 %
FEV6-%Pred-Post: 100 %
FEV6-%Pred-Pre: 99 %
FEV6-Post: 2.77 L
FEV6-Pre: 2.76 L
FEV6FVC-%Change-Post: 1 %
FEV6FVC-%Pred-Post: 98 %
FEV6FVC-%Pred-Pre: 96 %
FVC-%Change-Post: -1 %
FVC-%Pred-Post: 101 %
FVC-%Pred-Pre: 102 %
FVC-Post: 2.94 L
FVC-Pre: 2.97 L
Post FEV1/FVC ratio: 60 %
Post FEV6/FVC ratio: 94 %
Pre FEV1/FVC ratio: 59 %
Pre FEV6/FVC Ratio: 93 %
RV % pred: 119 %
RV: 2.39 L
TLC % pred: 117 %
TLC: 5.58 L

## 2023-05-09 LAB — COMPREHENSIVE METABOLIC PANEL
ALT: 19 U/L (ref 0–44)
AST: 20 U/L (ref 15–41)
Albumin: 4.1 g/dL (ref 3.5–5.0)
Alkaline Phosphatase: 104 U/L (ref 38–126)
Anion gap: 13 (ref 5–15)
BUN: 14 mg/dL (ref 8–23)
CO2: 26 mmol/L (ref 22–32)
Calcium: 9.5 mg/dL (ref 8.9–10.3)
Chloride: 102 mmol/L (ref 98–111)
Creatinine, Ser: 1.17 mg/dL — ABNORMAL HIGH (ref 0.44–1.00)
GFR, Estimated: 51 mL/min — ABNORMAL LOW (ref >60)
Glucose, Bld: 112 mg/dL — ABNORMAL HIGH (ref 70–99)
Potassium: 4.3 mmol/L (ref 3.5–5.1)
Sodium: 141 mmol/L (ref 135–145)
Total Bilirubin: 0.6 mg/dL (ref <1.2)
Total Protein: 7.7 g/dL (ref 6.5–8.1)

## 2023-05-09 LAB — ECHOCARDIOGRAM COMPLETE
Area-P 1/2: 2.47 cm2
MV M vel: 5.19 m/s
MV Peak grad: 107.7 mm[Hg]
MV VTI: 0.66 cm2
S' Lateral: 3.8 cm

## 2023-05-09 LAB — CBC
HCT: 38.4 % (ref 36.0–46.0)
Hemoglobin: 12.2 g/dL (ref 12.0–15.0)
MCH: 27.4 pg (ref 26.0–34.0)
MCHC: 31.8 g/dL (ref 30.0–36.0)
MCV: 86.1 fL (ref 80.0–100.0)
Platelets: 191 10*3/uL (ref 150–400)
RBC: 4.46 MIL/uL (ref 3.87–5.11)
RDW: 15.3 % (ref 11.5–15.5)
WBC: 5.1 10*3/uL (ref 4.0–10.5)
nRBC: 0 % (ref 0.0–0.2)

## 2023-05-09 LAB — GLUCOSE, CAPILLARY: Glucose-Capillary: 121 mg/dL — ABNORMAL HIGH (ref 70–99)

## 2023-05-09 LAB — SURGICAL PCR SCREEN
MRSA, PCR: NEGATIVE
Staphylococcus aureus: NEGATIVE

## 2023-05-09 LAB — PROTIME-INR
INR: 1 (ref 0.8–1.2)
Prothrombin Time: 13.1 s (ref 11.4–15.2)

## 2023-05-09 LAB — HEMOGLOBIN A1C
Hgb A1c MFr Bld: 5.8 % — ABNORMAL HIGH (ref 4.8–5.6)
Mean Plasma Glucose: 119.76 mg/dL

## 2023-05-09 LAB — APTT: aPTT: 29 s (ref 24–36)

## 2023-05-09 MED ORDER — ALBUTEROL SULFATE (2.5 MG/3ML) 0.083% IN NEBU
2.5000 mg | INHALATION_SOLUTION | Freq: Once | RESPIRATORY_TRACT | Status: AC
  Administered 2023-05-09: 2.5 mg via RESPIRATORY_TRACT

## 2023-05-09 NOTE — Progress Notes (Signed)
PCP - Dr. Cristino Martes Cardiologist - Dr. Donato Schultz  PPM/ICD - Denies Device Orders - n.a Rep Notified - n/a  Chest x-ray - 05/09/23 EKG - 05/09/23 Stress Test - 05/18/21 ECHO - 11/02/22 Cardiac Cath -11/02/22   Sleep Study -  yes CPAP - denies  Fasting Blood Sugar - denies checking it Checks Blood Sugar _____ times a day  Last dose of GLP1 agonist-  denies GLP1 instructions: n/a  Blood Thinner Instructions: does not take any Aspirin Instructions: patient stopped taking.  Last dose on 05/08/23  ERAS Protcol - npo PRE-SURGERY Ensure or G2- n  COVID TEST- y   Anesthesia review: y; cardiac history, diabetic,   Patient denies shortness of breath, fever, cough and chest pain at PAT appointment   All instructions explained to the patient, with a verbal understanding of the material. Patient agrees to go over the instructions while at home for a better understanding. Patient also instructed to self quarantine after being tested for COVID-19. The opportunity to ask questions was provided.

## 2023-05-10 ENCOUNTER — Telehealth (HOSPITAL_COMMUNITY): Payer: Self-pay | Admitting: Licensed Clinical Social Worker

## 2023-05-10 ENCOUNTER — Encounter: Payer: Self-pay | Admitting: *Deleted

## 2023-05-10 LAB — SARS CORONAVIRUS 2 (TAT 6-24 HRS): SARS Coronavirus 2: NEGATIVE

## 2023-05-10 MED ORDER — INSULIN REGULAR(HUMAN) IN NACL 100-0.9 UT/100ML-% IV SOLN
INTRAVENOUS | Status: AC
  Administered 2023-05-11: 3.8 [IU]/h via INTRAVENOUS
  Filled 2023-05-10: qty 100

## 2023-05-10 MED ORDER — CEFAZOLIN SODIUM-DEXTROSE 2-4 GM/100ML-% IV SOLN
2.0000 g | INTRAVENOUS | Status: DC
  Filled 2023-05-10: qty 100

## 2023-05-10 MED ORDER — NITROGLYCERIN IN D5W 200-5 MCG/ML-% IV SOLN
2.0000 ug/min | INTRAVENOUS | Status: DC
  Filled 2023-05-10 (×2): qty 250

## 2023-05-10 MED ORDER — PHENYLEPHRINE HCL-NACL 20-0.9 MG/250ML-% IV SOLN
30.0000 ug/min | INTRAVENOUS | Status: AC
  Administered 2023-05-11: 20 ug/min via INTRAVENOUS
  Filled 2023-05-10 (×2): qty 250

## 2023-05-10 MED ORDER — MANNITOL 20 % IV SOLN
INTRAVENOUS | Status: DC
  Filled 2023-05-10 (×2): qty 13

## 2023-05-10 MED ORDER — DEXMEDETOMIDINE HCL IN NACL 400 MCG/100ML IV SOLN
0.1000 ug/kg/h | INTRAVENOUS | Status: AC
  Administered 2023-05-11: .4 ug/kg/h via INTRAVENOUS
  Filled 2023-05-10 (×2): qty 100

## 2023-05-10 MED ORDER — VANCOMYCIN HCL 1250 MG/250ML IV SOLN
1250.0000 mg | INTRAVENOUS | Status: AC
  Administered 2023-05-11: 1250 mg via INTRAVENOUS
  Filled 2023-05-10 (×2): qty 250

## 2023-05-10 MED ORDER — EPINEPHRINE HCL 5 MG/250ML IV SOLN IN NS
0.0000 ug/min | INTRAVENOUS | Status: DC
  Filled 2023-05-10 (×2): qty 250

## 2023-05-10 MED ORDER — HEPARIN 30,000 UNITS/1000 ML (OHS) CELLSAVER SOLUTION
Status: DC
  Filled 2023-05-10 (×3): qty 1000

## 2023-05-10 MED ORDER — TRANEXAMIC ACID 1000 MG/10ML IV SOLN
1.5000 mg/kg/h | INTRAVENOUS | Status: AC
  Administered 2023-05-11: 1.5 mg/kg/h via INTRAVENOUS
  Filled 2023-05-10 (×2): qty 25

## 2023-05-10 MED ORDER — TRANEXAMIC ACID (OHS) BOLUS VIA INFUSION
15.0000 mg/kg | INTRAVENOUS | Status: AC
  Administered 2023-05-11: 1245 mg via INTRAVENOUS
  Filled 2023-05-10 (×3): qty 1245

## 2023-05-10 MED ORDER — MILRINONE LACTATE IN DEXTROSE 20-5 MG/100ML-% IV SOLN
0.3000 ug/kg/min | INTRAVENOUS | Status: AC
  Administered 2023-05-11: .25 ug/kg/min via INTRAVENOUS
  Filled 2023-05-10 (×2): qty 100

## 2023-05-10 MED ORDER — NOREPINEPHRINE 4 MG/250ML-% IV SOLN
0.0000 ug/min | INTRAVENOUS | Status: AC
  Administered 2023-05-11: 2 ug/min via INTRAVENOUS
  Filled 2023-05-10 (×2): qty 250

## 2023-05-10 MED ORDER — TRANEXAMIC ACID (OHS) PUMP PRIME SOLUTION
2.0000 mg/kg | INTRAVENOUS | Status: DC
  Filled 2023-05-10 (×2): qty 1.66

## 2023-05-10 MED ORDER — POTASSIUM CHLORIDE 2 MEQ/ML IV SOLN
80.0000 meq | INTRAVENOUS | Status: DC
  Filled 2023-05-10 (×2): qty 40

## 2023-05-10 MED ORDER — PLASMA-LYTE A IV SOLN
INTRAVENOUS | Status: DC
  Filled 2023-05-10: qty 2.5

## 2023-05-10 MED ORDER — CEFAZOLIN SODIUM-DEXTROSE 2-4 GM/100ML-% IV SOLN
2.0000 g | INTRAVENOUS | Status: AC
  Administered 2023-05-11: 2 g via INTRAVENOUS
  Filled 2023-05-10: qty 100

## 2023-05-10 MED ORDER — VANCOMYCIN HCL 1000 MG IV SOLR
INTRAVENOUS | Status: DC
  Filled 2023-05-10 (×2): qty 20

## 2023-05-10 NOTE — H&P (Signed)
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 Pt has had completed work up for surgery. She is struggling with be evicted from her apartment and still has no place to live following surgery. Her symptoms mostly back pain currently and is able to perform her daily activities. No increasing DOE. She is very nervous about the surgery             Past Medical History:  Diagnosis Date   Allergy     Anemia     Anxiety     Arthritis     Asthma     Back pain     CAD (coronary artery disease)     Chronic headaches     Class 1 obesity due to excess calories with serious comorbidity in adult     Complication of anesthesia      slow to wake up   COPD (chronic obstructive pulmonary disease) (HCC)     Diabetes (HCC)     Fatty liver     GERD (gastroesophageal reflux disease)     Headache     Herniated lumbar intervertebral disc     High cholesterol     History of blood clots     Lumbago with sciatica, unspecified side     Mitral stenosis      severe by 05/18/21 echo   Moderate episode of recurrent major depressive disorder (HCC)     Nicotine dependence, cigarettes, with other nicotine-induced disorders     Nonrheumatic aortic (valve) insufficiency     Other chronic pain     Pain in joint involving ankle and foot     Pancreatitis     Pneumonia     Presence urogenital implant     Sleep apnea      no cpap   Stroke (HCC)      mild   SUI (stress urinary incontinence, female)     Type 2 diabetes mellitus with hyperglycemia, with long-term current use of insulin (HCC)                 Past Surgical History:  Procedure Laterality Date   ANKLE SURGERY         x 3   Bladder tack       BREAST REDUCTION SURGERY       CHOLECYSTECTOMY   1998   HERNIA REPAIR       I & D EXTREMITY Right 10/28/2021    Procedure: IRRIGATION AND DEBRIDEMENT EXTREMITY, GREAT TOE;  Surgeon: Netta Cedars, MD;  Location: [facility] OR;  Service: Orthopedics;  Laterality: Right;   RIGHT/LEFT HEART CATH AND CORONARY ANGIOGRAPHY N/A 11/02/2022    Procedure: RIGHT/LEFT HEART CATH AND CORONARY ANGIOGRAPHY;  Surgeon: Tonny Bollman, MD;  Location: [facility] INVASIVE CV LAB;  Service: Cardiovascular;  Laterality: N/A;   TEE WITHOUT CARDIOVERSION N/A 11/02/2022    Procedure: TRANSESOPHAGEAL  ECHOCARDIOGRAM;  Surgeon: Jake Bathe, MD;  Location: [facility] INVASIVE CV LAB;  Service: Cardiovascular;  Laterality: N/A;   VAGINAL HYSTERECTOMY   1988          Tobacco Use History  Social History        Tobacco Use  Smoking Status Some Days   Current packs/day: 0.50   Average packs/day: 0.5 packs/day for 47.0 years (23.5 ttl pk-yrs)   Types: Cigarettes  Smokeless Tobacco Never  Tobacco Comments    0.5 packs smoked daily. ARJ 03/29/21      Social History        Substance and Sexual Activity  Alcohol Use No   Alcohol/week: 0.0 standard drinks of alcohol      Social History         Socioeconomic History   Marital status: Single      Spouse name: Not on file   Number of children: Not on file   Years of education: Not on file   Highest education level: Not on file  Occupational History   Not on file  Tobacco Use   Smoking status: Some Days      Current packs/day: 0.50      Average packs/day: 0.5 packs/day for 47.0 years (23.5 ttl pk-yrs)      Types: Cigarettes   Smokeless tobacco: Never   Tobacco comments:      0.5 packs smoked daily. ARJ 03/29/21  Vaping Use   Vaping status: Former   Substances: Nicotine, Flavoring  Substance and Sexual Activity   Alcohol use: No      Alcohol/week: 0.0 standard drinks of alcohol   Drug use: Not Currently      Types: Marijuana   Sexual  activity: Not Currently  Other Topics Concern   Not on file  Social History Narrative   Not on file    Social Determinants of Health        Financial Resource Strain: Not on file  Food Insecurity: Not on file  Transportation Needs: Not on file  Physical Activity: Not on file  Stress: Not on file  Social Connections: Unknown (05/17/2022)    Received from [facility], [facility]    Social Network     Social Network: Not on file  Intimate Partner Violence: Unknown (05/17/2022)    Received from [employer], [facility]    HITS     Physically Hurt: Not on file     Insult or Talk Down To: Not on file     Threaten Physical Harm: Not on file     Scream or Curse: Not on file      Allergies       Allergies  Allergen Reactions   Erythromycin Shortness Of Breath   Nickel Rash and Shortness Of Breath   Oxycodone Shortness Of Breath   Meperidine Other (See Comments)      Confusion and tingling  (noted in H&P) Confusion and tingling  (noted in H&P)     Codeine Swelling   Hydrocodone-Acetaminophen Itching      Pt reports she can take the 7.5-325 strength.     Morphine Itching   Propoxyphene Other (See Comments)   Quetiapine        Other reaction(s): Other (See Comments) Restless leg syndrome              Current Outpatient Medications  Medication Sig Dispense Refill   albuterol (VENTOLIN HFA) 108 (90 Base) MCG/ACT inhaler inhale 2 PUFFS into THE lungs EVERY  6 HOURS AS NEEDED FOR WHEEZING OR SHORTNESS OF BREATH (Patient taking differently: Inhale 2 puffs into the lungs every 6 (six) hours as needed for shortness of breath or wheezing.) 8.5 g 0   aspirin EC 81 MG tablet Take 1 tablet (81 mg total) by mouth daily. Swallow whole. 30 tablet 11   clonazePAM (KLONOPIN) 1 MG tablet Take 1 mg by mouth in the morning, at noon, and at bedtime.       clotrimazole-betamethasone (LOTRISONE) cream Apply 1 application topically 2 (two) times daily.       gabapentin (NEURONTIN) 600 MG  tablet Take 600 mg by mouth 3 (three) times daily as needed (muscle spasms).       metFORMIN (GLUCOPHAGE) 500 MG tablet Take 500 mg by mouth 2 (two) times daily.       methocarbamol (ROBAXIN) 500 MG tablet Take 1 tablet (500 mg total) by mouth every 8 (eight) hours as needed for muscle spasms. 8 tablet 0   Multiple Vitamins-Minerals (CENTRUM ADULTS PO) Take 1 tablet by mouth daily.       omeprazole (PRILOSEC) 40 MG capsule Take 40 mg by mouth daily.       rosuvastatin (CRESTOR) 40 MG tablet Take 1 tablet (40 mg total) by mouth at bedtime. 90 tablet 3   traZODone (DESYREL) 50 MG tablet Take 75 mg by mouth at bedtime. Pt takes 1and 1/2 tablet at bedtime.          No current facility-administered medications for this visit.               Family History  Problem Relation Age of Onset   Emphysema Mother     Heart disease Mother     Cancer Mother     Heart disease Father     Cancer Sister                  Physical Exam:           Diagnostic Studies & Laboratory data: I have personally reviewed the following studies and agree with the findings     Recent Radiology Findings:        Recent Lab Findings: Recent Labs       Lab Results  Component Value Date    WBC 5.4 10/27/2022    HGB 11.2 (L) 11/02/2022    HCT 33.0 (L) 11/02/2022    PLT 236 10/27/2022    GLUCOSE 116 (H) 10/27/2022    CHOL 158 12/13/2018    TRIG 133 12/13/2018    HDL 51 12/13/2018    LDLCALC 80 12/13/2018    ALT 17 10/28/2021    AST 32 10/28/2021    NA 140 11/02/2022    K 3.7 11/02/2022    CL 104 10/27/2022    CREATININE 0.99 10/27/2022    BUN 10 10/27/2022    CO2 20 10/27/2022    TSH 1.776 12/13/2018    INR 1.0 02/03/2009    HGBA1C 5.4 12/13/2018            Assessment / Plan:     67 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 She is hopeful it will be soon. She does have nickel allergy and we are arranging other sternal closure  device for her. She will see Cardiology in meantime of her arranging her housing issue. She will call if her symptoms worsen.

## 2023-05-10 NOTE — Telephone Encounter (Signed)
H&V Care Navigation CSW Progress Note  Clinical Social Worker informed that patient is scheduled for surgery tomorrow and is worried about eviction.  States she has court date on 12/17 to revisit her eviction but she is getting open heart surgery tomorrow and won't be able to attend as she will require 5-7 day inpatient stay followed by SNF stay for 1-2 week rehab stay and then a further 12 weeks recovery.  Patient getting letter from MD office to take to court in hopes this will delay date but this is not a sure thing.  CSW called management company to get more info on situation.  They confirm they have been trying to evict patient since Oct 2023- patient is behind over $11,000 on rent though has been paying through the court in recent months.  CSW sent letter from MD regarding condition to see if they can accept an extension on court date and eviction till after she has recovered.  Also sent letter signed by patient giving permission for leasing agency to discuss her case with me.  CSW spoke with patients [employer] medicaid case worker, Neysa Bonito [phone], who is trying to assist in finding alternative housing alongside [employer] case worker Joni Reining, [phone], but nothing available at this time.  Trying to refer to [facility] so she can receive mental health support as well- unsure of timeline of this process currently.  Neysa Bonito stated patient had mentioned she received something from legal aid recently but when I asked patient she denied- unclear of actual status with legal aid- they did help with court date in February- CSW attempted to contact- awaiting return call.  SDOH Screenings   Housing: Low Risk  (05/10/2023)  Transportation Needs: No Transportation Needs (01/25/2023)  Alcohol Screen: Low Risk  (12/13/2018)  Financial Resource Strain: Medium Risk (01/25/2023)  Social Connections: Unknown (05/17/2022)   Received from [facility], [facility]  Tobacco Use: High Risk (05/09/2023)   Will  continue to follow and assist as needed   Burna Sis, LCSW Clinical Social Worker [facility] Desk#: [phone] Cell#: [phone]

## 2023-05-11 ENCOUNTER — Telehealth (HOSPITAL_COMMUNITY): Payer: Self-pay | Admitting: Licensed Clinical Social Worker

## 2023-05-11 ENCOUNTER — Other Ambulatory Visit: Payer: Self-pay

## 2023-05-11 ENCOUNTER — Encounter (HOSPITAL_COMMUNITY): Payer: Self-pay | Admitting: Thoracic Surgery (Cardiothoracic Vascular Surgery)

## 2023-05-11 ENCOUNTER — Encounter (HOSPITAL_COMMUNITY)
Admission: RE | Disposition: A | Discharge status: Home Health | Payer: Self-pay | Source: Home / Self Care | Attending: Thoracic Surgery (Cardiothoracic Vascular Surgery)

## 2023-05-11 ENCOUNTER — Inpatient Hospital Stay (HOSPITAL_COMMUNITY): Payer: 59 | Admitting: Anesthesiology

## 2023-05-11 ENCOUNTER — Inpatient Hospital Stay (HOSPITAL_COMMUNITY)
Admission: RE | Admit: 2023-05-11 | Discharge: 2023-05-22 | DRG: 219 | Disposition: A | Discharge status: Home Health | Payer: 59 | Attending: Thoracic Surgery (Cardiothoracic Vascular Surgery) | Admitting: Thoracic Surgery (Cardiothoracic Vascular Surgery)

## 2023-05-11 ENCOUNTER — Inpatient Hospital Stay (HOSPITAL_COMMUNITY): Payer: 59

## 2023-05-11 DIAGNOSIS — K760 Fatty (change of) liver, not elsewhere classified: Secondary | ICD-10-CM | POA: Diagnosis present

## 2023-05-11 DIAGNOSIS — E1159 Type 2 diabetes mellitus with other circulatory complications: Secondary | ICD-10-CM

## 2023-05-11 DIAGNOSIS — E6609 Other obesity due to excess calories: Secondary | ICD-10-CM | POA: Diagnosis present

## 2023-05-11 DIAGNOSIS — Z881 Allergy status to other antibiotic agents status: Secondary | ICD-10-CM | POA: Diagnosis not present

## 2023-05-11 DIAGNOSIS — Z952 Presence of prosthetic heart valve: Principal | ICD-10-CM

## 2023-05-11 DIAGNOSIS — I071 Rheumatic tricuspid insufficiency: Secondary | ICD-10-CM

## 2023-05-11 DIAGNOSIS — Z8673 Personal history of transient ischemic attack (TIA), and cerebral infarction without residual deficits: Secondary | ICD-10-CM

## 2023-05-11 DIAGNOSIS — Z90710 Acquired absence of both cervix and uterus: Secondary | ICD-10-CM

## 2023-05-11 DIAGNOSIS — E7800 Pure hypercholesterolemia, unspecified: Secondary | ICD-10-CM | POA: Diagnosis present

## 2023-05-11 DIAGNOSIS — K861 Other chronic pancreatitis: Secondary | ICD-10-CM | POA: Diagnosis present

## 2023-05-11 DIAGNOSIS — D490 Neoplasm of unspecified behavior of digestive system: Secondary | ICD-10-CM | POA: Diagnosis present

## 2023-05-11 DIAGNOSIS — I050 Rheumatic mitral stenosis: Secondary | ICD-10-CM

## 2023-05-11 DIAGNOSIS — I2510 Atherosclerotic heart disease of native coronary artery without angina pectoris: Secondary | ICD-10-CM

## 2023-05-11 DIAGNOSIS — F419 Anxiety disorder, unspecified: Secondary | ICD-10-CM | POA: Diagnosis present

## 2023-05-11 DIAGNOSIS — L299 Pruritus, unspecified: Secondary | ICD-10-CM | POA: Diagnosis present

## 2023-05-11 DIAGNOSIS — Z79899 Other long term (current) drug therapy: Secondary | ICD-10-CM

## 2023-05-11 DIAGNOSIS — E1169 Type 2 diabetes mellitus with other specified complication: Secondary | ICD-10-CM

## 2023-05-11 DIAGNOSIS — I083 Combined rheumatic disorders of mitral, aortic and tricuspid valves: Secondary | ICD-10-CM | POA: Diagnosis present

## 2023-05-11 DIAGNOSIS — Z7984 Long term (current) use of oral hypoglycemic drugs: Secondary | ICD-10-CM

## 2023-05-11 DIAGNOSIS — I959 Hypotension, unspecified: Secondary | ICD-10-CM | POA: Diagnosis not present

## 2023-05-11 DIAGNOSIS — Z825 Family history of asthma and other chronic lower respiratory diseases: Secondary | ICD-10-CM

## 2023-05-11 DIAGNOSIS — G2581 Restless legs syndrome: Secondary | ICD-10-CM | POA: Diagnosis present

## 2023-05-11 DIAGNOSIS — N179 Acute kidney failure, unspecified: Secondary | ICD-10-CM | POA: Diagnosis present

## 2023-05-11 DIAGNOSIS — E871 Hypo-osmolality and hyponatremia: Secondary | ICD-10-CM | POA: Diagnosis present

## 2023-05-11 DIAGNOSIS — Z791 Long term (current) use of non-steroidal anti-inflammatories (NSAID): Secondary | ICD-10-CM

## 2023-05-11 DIAGNOSIS — Z7951 Long term (current) use of inhaled steroids: Secondary | ICD-10-CM

## 2023-05-11 DIAGNOSIS — F331 Major depressive disorder, recurrent, moderate: Secondary | ICD-10-CM | POA: Diagnosis present

## 2023-05-11 DIAGNOSIS — Z794 Long term (current) use of insulin: Secondary | ICD-10-CM | POA: Diagnosis not present

## 2023-05-11 DIAGNOSIS — Z6835 Body mass index (BMI) 35.0-35.9, adult: Secondary | ICD-10-CM

## 2023-05-11 DIAGNOSIS — I051 Rheumatic mitral insufficiency: Secondary | ICD-10-CM

## 2023-05-11 DIAGNOSIS — E66811 Obesity, class 1: Secondary | ICD-10-CM | POA: Diagnosis present

## 2023-05-11 DIAGNOSIS — I480 Paroxysmal atrial fibrillation: Secondary | ICD-10-CM | POA: Diagnosis present

## 2023-05-11 DIAGNOSIS — Z86718 Personal history of other venous thrombosis and embolism: Secondary | ICD-10-CM

## 2023-05-11 DIAGNOSIS — D62 Acute posthemorrhagic anemia: Secondary | ICD-10-CM | POA: Diagnosis present

## 2023-05-11 DIAGNOSIS — Z9049 Acquired absence of other specified parts of digestive tract: Secondary | ICD-10-CM

## 2023-05-11 DIAGNOSIS — F17210 Nicotine dependence, cigarettes, uncomplicated: Secondary | ICD-10-CM | POA: Diagnosis present

## 2023-05-11 DIAGNOSIS — J90 Pleural effusion, not elsewhere classified: Secondary | ICD-10-CM | POA: Diagnosis not present

## 2023-05-11 DIAGNOSIS — D6959 Other secondary thrombocytopenia: Secondary | ICD-10-CM | POA: Diagnosis not present

## 2023-05-11 DIAGNOSIS — E785 Hyperlipidemia, unspecified: Secondary | ICD-10-CM

## 2023-05-11 DIAGNOSIS — Z883 Allergy status to other anti-infective agents status: Secondary | ICD-10-CM

## 2023-05-11 DIAGNOSIS — Z8249 Family history of ischemic heart disease and other diseases of the circulatory system: Secondary | ICD-10-CM

## 2023-05-11 DIAGNOSIS — Z9981 Dependence on supplemental oxygen: Secondary | ICD-10-CM

## 2023-05-11 DIAGNOSIS — Z978 Presence of other specified devices: Secondary | ICD-10-CM

## 2023-05-11 DIAGNOSIS — Z888 Allergy status to other drugs, medicaments and biological substances status: Secondary | ICD-10-CM

## 2023-05-11 DIAGNOSIS — Z7982 Long term (current) use of aspirin: Secondary | ICD-10-CM

## 2023-05-11 DIAGNOSIS — E872 Acidosis, unspecified: Secondary | ICD-10-CM | POA: Diagnosis not present

## 2023-05-11 DIAGNOSIS — T8119XA Other postprocedural shock, initial encounter: Secondary | ICD-10-CM | POA: Diagnosis not present

## 2023-05-11 DIAGNOSIS — J4489 Other specified chronic obstructive pulmonary disease: Secondary | ICD-10-CM | POA: Diagnosis present

## 2023-05-11 DIAGNOSIS — I081 Rheumatic disorders of both mitral and tricuspid valves: Principal | ICD-10-CM | POA: Diagnosis present

## 2023-05-11 DIAGNOSIS — E119 Type 2 diabetes mellitus without complications: Secondary | ICD-10-CM | POA: Diagnosis present

## 2023-05-11 DIAGNOSIS — Z599 Problem related to housing and economic circumstances, unspecified: Secondary | ICD-10-CM

## 2023-05-11 DIAGNOSIS — J9811 Atelectasis: Secondary | ICD-10-CM | POA: Diagnosis not present

## 2023-05-11 DIAGNOSIS — Z91048 Other nonmedicinal substance allergy status: Secondary | ICD-10-CM

## 2023-05-11 DIAGNOSIS — K219 Gastro-esophageal reflux disease without esophagitis: Secondary | ICD-10-CM | POA: Diagnosis present

## 2023-05-11 DIAGNOSIS — Z716 Tobacco abuse counseling: Secondary | ICD-10-CM

## 2023-05-11 DIAGNOSIS — Z885 Allergy status to narcotic agent status: Secondary | ICD-10-CM

## 2023-05-11 DIAGNOSIS — I700 Atherosclerosis of aorta: Secondary | ICD-10-CM

## 2023-05-11 DIAGNOSIS — Z7901 Long term (current) use of anticoagulants: Secondary | ICD-10-CM

## 2023-05-11 HISTORY — PX: MITRAL VALVE REPLACEMENT: SHX147

## 2023-05-11 HISTORY — PX: TEE WITHOUT CARDIOVERSION: SHX5443

## 2023-05-11 LAB — POCT I-STAT 7, (LYTES, BLD GAS, ICA,H+H)
Acid-Base Excess: 1 mmol/L (ref 0.0–2.0)
Acid-Base Excess: 0 mmol/L (ref 0.0–2.0)
Acid-Base Excess: 0 mmol/L (ref 0.0–2.0)
Acid-base deficit: 2 mmol/L (ref 0.0–2.0)
Acid-base deficit: 4 mmol/L — ABNORMAL HIGH (ref 0.0–2.0)
Acid-base deficit: 2 mmol/L (ref 0.0–2.0)
Acid-base deficit: 5 mmol/L — ABNORMAL HIGH (ref 0.0–2.0)
Acid-base deficit: 2 mmol/L (ref 0.0–2.0)
Acid-base deficit: 2 mmol/L (ref 0.0–2.0)
Acid-base deficit: 1 mmol/L (ref 0.0–2.0)
Bicarbonate: 24 mmol/L (ref 20.0–28.0)
Bicarbonate: 21.1 mmol/L (ref 20.0–28.0)
Bicarbonate: 25.2 mmol/L (ref 20.0–28.0)
Bicarbonate: 23.4 mmol/L (ref 20.0–28.0)
Bicarbonate: 24.9 mmol/L (ref 20.0–28.0)
Bicarbonate: 24.2 mmol/L (ref 20.0–28.0)
Bicarbonate: 26.5 mmol/L (ref 20.0–28.0)
Bicarbonate: 25.9 mmol/L (ref 20.0–28.0)
Bicarbonate: 26.1 mmol/L (ref 20.0–28.0)
Bicarbonate: 24.8 mmol/L (ref 20.0–28.0)
Calcium, Ion: 1.17 mmol/L (ref 1.15–1.40)
Calcium, Ion: 0.89 mmol/L — CL (ref 1.15–1.40)
Calcium, Ion: 1.01 mmol/L — ABNORMAL LOW (ref 1.15–1.40)
Calcium, Ion: 1.15 mmol/L (ref 1.15–1.40)
Calcium, Ion: 1.1 mmol/L — ABNORMAL LOW (ref 1.15–1.40)
Calcium, Ion: 1.08 mmol/L — ABNORMAL LOW (ref 1.15–1.40)
Calcium, Ion: 1.06 mmol/L — ABNORMAL LOW (ref 1.15–1.40)
Calcium, Ion: 1.07 mmol/L — ABNORMAL LOW (ref 1.15–1.40)
Calcium, Ion: 1.08 mmol/L — ABNORMAL LOW (ref 1.15–1.40)
Calcium, Ion: 1.07 mmol/L — ABNORMAL LOW (ref 1.15–1.40)
HCT: 30 % — ABNORMAL LOW (ref 36.0–46.0)
HCT: 24 % — ABNORMAL LOW (ref 36.0–46.0)
HCT: 23 % — ABNORMAL LOW (ref 36.0–46.0)
HCT: 25 % — ABNORMAL LOW (ref 36.0–46.0)
HCT: 21 % — ABNORMAL LOW (ref 36.0–46.0)
HCT: 22 % — ABNORMAL LOW (ref 36.0–46.0)
HCT: 22 % — ABNORMAL LOW (ref 36.0–46.0)
HCT: 21 % — ABNORMAL LOW (ref 36.0–46.0)
HCT: 22 % — ABNORMAL LOW (ref 36.0–46.0)
HCT: 22 % — ABNORMAL LOW (ref 36.0–46.0)
Hemoglobin: 10.2 g/dL — ABNORMAL LOW (ref 12.0–15.0)
Hemoglobin: 8.2 g/dL — ABNORMAL LOW (ref 12.0–15.0)
Hemoglobin: 7.8 g/dL — ABNORMAL LOW (ref 12.0–15.0)
Hemoglobin: 8.5 g/dL — ABNORMAL LOW (ref 12.0–15.0)
Hemoglobin: 7.1 g/dL — ABNORMAL LOW (ref 12.0–15.0)
Hemoglobin: 7.5 g/dL — ABNORMAL LOW (ref 12.0–15.0)
Hemoglobin: 7.5 g/dL — ABNORMAL LOW (ref 12.0–15.0)
Hemoglobin: 7.1 g/dL — ABNORMAL LOW (ref 12.0–15.0)
Hemoglobin: 7.5 g/dL — ABNORMAL LOW (ref 12.0–15.0)
Hemoglobin: 7.5 g/dL — ABNORMAL LOW (ref 12.0–15.0)
O2 Saturation: 100 %
O2 Saturation: 100 %
O2 Saturation: 100 %
O2 Saturation: 97 %
O2 Saturation: 96 %
O2 Saturation: 98 %
O2 Saturation: 97 %
O2 Saturation: 97 %
O2 Saturation: 95 %
O2 Saturation: 93 %
Patient temperature: 35.9
Patient temperature: 35.7
Patient temperature: 36.4
Patient temperature: 37
Patient temperature: 37
Patient temperature: 37.3
Patient temperature: 37.3
Potassium: 4.1 mmol/L (ref 3.5–5.1)
Potassium: 3.9 mmol/L (ref 3.5–5.1)
Potassium: 4.3 mmol/L (ref 3.5–5.1)
Potassium: 4.1 mmol/L (ref 3.5–5.1)
Potassium: 4.2 mmol/L (ref 3.5–5.1)
Potassium: 4.7 mmol/L (ref 3.5–5.1)
Potassium: 4.3 mmol/L (ref 3.5–5.1)
Potassium: 4.3 mmol/L (ref 3.5–5.1)
Potassium: 4.5 mmol/L (ref 3.5–5.1)
Potassium: 4.3 mmol/L (ref 3.5–5.1)
Sodium: 135 mmol/L (ref 135–145)
Sodium: 142 mmol/L (ref 135–145)
Sodium: 138 mmol/L (ref 135–145)
Sodium: 138 mmol/L (ref 135–145)
Sodium: 140 mmol/L (ref 135–145)
Sodium: 139 mmol/L (ref 135–145)
Sodium: 140 mmol/L (ref 135–145)
Sodium: 140 mmol/L (ref 135–145)
Sodium: 140 mmol/L (ref 135–145)
Sodium: 139 mmol/L (ref 135–145)
TCO2: 25 mmol/L (ref 22–32)
TCO2: 22 mmol/L (ref 22–32)
TCO2: 27 mmol/L (ref 22–32)
TCO2: 25 mmol/L (ref 22–32)
TCO2: 26 mmol/L (ref 22–32)
TCO2: 26 mmol/L (ref 22–32)
TCO2: 28 mmol/L (ref 22–32)
TCO2: 27 mmol/L (ref 22–32)
TCO2: 28 mmol/L (ref 22–32)
TCO2: 26 mmol/L (ref 22–32)
pCO2 arterial: 47.3 mm[Hg] (ref 32–48)
pCO2 arterial: 39.5 mm[Hg] (ref 32–48)
pCO2 arterial: 59.1 mm[Hg] — ABNORMAL HIGH (ref 32–48)
pCO2 arterial: 57.2 mm[Hg] — ABNORMAL HIGH (ref 32–48)
pCO2 arterial: 47.6 mm[Hg] (ref 32–48)
pCO2 arterial: 46.4 mm[Hg] (ref 32–48)
pCO2 arterial: 48.3 mm[Hg] — ABNORMAL HIGH (ref 32–48)
pCO2 arterial: 50.4 mm[Hg] — ABNORMAL HIGH (ref 32–48)
pCO2 arterial: 52.8 mm[Hg] — ABNORMAL HIGH (ref 32–48)
pCO2 arterial: 48.2 mm[Hg] — ABNORMAL HIGH (ref 32–48)
pH, Arterial: 7.313 — ABNORMAL LOW (ref 7.35–7.45)
pH, Arterial: 7.337 — ABNORMAL LOW (ref 7.35–7.45)
pH, Arterial: 7.239 — ABNORMAL LOW (ref 7.35–7.45)
pH, Arterial: 7.214 — ABNORMAL LOW (ref 7.35–7.45)
pH, Arterial: 7.319 — ABNORMAL LOW (ref 7.35–7.45)
pH, Arterial: 7.323 — ABNORMAL LOW (ref 7.35–7.45)
pH, Arterial: 7.347 — ABNORMAL LOW (ref 7.35–7.45)
pH, Arterial: 7.32 — ABNORMAL LOW (ref 7.35–7.45)
pH, Arterial: 7.304 — ABNORMAL LOW (ref 7.35–7.45)
pH, Arterial: 7.321 — ABNORMAL LOW (ref 7.35–7.45)
pO2, Arterial: 444 mm[Hg] — ABNORMAL HIGH (ref 83–108)
pO2, Arterial: 408 mm[Hg] — ABNORMAL HIGH (ref 83–108)
pO2, Arterial: 203 mm[Hg] — ABNORMAL HIGH (ref 83–108)
pO2, Arterial: 102 mm[Hg] (ref 83–108)
pO2, Arterial: 83 mm[Hg] (ref 83–108)
pO2, Arterial: 109 mm[Hg] — ABNORMAL HIGH (ref 83–108)
pO2, Arterial: 94 mm[Hg] (ref 83–108)
pO2, Arterial: 95 mm[Hg] (ref 83–108)
pO2, Arterial: 85 mm[Hg] (ref 83–108)
pO2, Arterial: 73 mm[Hg] — ABNORMAL LOW (ref 83–108)

## 2023-05-11 LAB — POCT I-STAT, CHEM 8
BUN: 19 mg/dL (ref 8–23)
BUN: 15 mg/dL (ref 8–23)
BUN: 17 mg/dL (ref 8–23)
BUN: 18 mg/dL (ref 8–23)
BUN: 18 mg/dL (ref 8–23)
Calcium, Ion: 1.14 mmol/L — ABNORMAL LOW (ref 1.15–1.40)
Calcium, Ion: 0.92 mmol/L — ABNORMAL LOW (ref 1.15–1.40)
Calcium, Ion: 1.13 mmol/L — ABNORMAL LOW (ref 1.15–1.40)
Calcium, Ion: 0.97 mmol/L — ABNORMAL LOW (ref 1.15–1.40)
Calcium, Ion: 1 mmol/L — ABNORMAL LOW (ref 1.15–1.40)
Chloride: 102 mmol/L (ref 98–111)
Chloride: 103 mmol/L (ref 98–111)
Chloride: 99 mmol/L (ref 98–111)
Chloride: 102 mmol/L (ref 98–111)
Chloride: 103 mmol/L (ref 98–111)
Creatinine, Ser: 1 mg/dL (ref 0.44–1.00)
Creatinine, Ser: 0.8 mg/dL (ref 0.44–1.00)
Creatinine, Ser: 0.9 mg/dL (ref 0.44–1.00)
Creatinine, Ser: 0.9 mg/dL (ref 0.44–1.00)
Creatinine, Ser: 0.9 mg/dL (ref 0.44–1.00)
Glucose, Bld: 158 mg/dL — ABNORMAL HIGH (ref 70–99)
Glucose, Bld: 139 mg/dL — ABNORMAL HIGH (ref 70–99)
Glucose, Bld: 165 mg/dL — ABNORMAL HIGH (ref 70–99)
Glucose, Bld: 134 mg/dL — ABNORMAL HIGH (ref 70–99)
Glucose, Bld: 121 mg/dL — ABNORMAL HIGH (ref 70–99)
HCT: 33 % — ABNORMAL LOW (ref 36.0–46.0)
HCT: 22 % — ABNORMAL LOW (ref 36.0–46.0)
HCT: 26 % — ABNORMAL LOW (ref 36.0–46.0)
HCT: 22 % — ABNORMAL LOW (ref 36.0–46.0)
HCT: 22 % — ABNORMAL LOW (ref 36.0–46.0)
Hemoglobin: 11.2 g/dL — ABNORMAL LOW (ref 12.0–15.0)
Hemoglobin: 7.5 g/dL — ABNORMAL LOW (ref 12.0–15.0)
Hemoglobin: 8.8 g/dL — ABNORMAL LOW (ref 12.0–15.0)
Hemoglobin: 7.5 g/dL — ABNORMAL LOW (ref 12.0–15.0)
Hemoglobin: 7.5 g/dL — ABNORMAL LOW (ref 12.0–15.0)
Potassium: 4.2 mmol/L (ref 3.5–5.1)
Potassium: 3.9 mmol/L (ref 3.5–5.1)
Potassium: 4 mmol/L (ref 3.5–5.1)
Potassium: 4.6 mmol/L (ref 3.5–5.1)
Potassium: 4.4 mmol/L (ref 3.5–5.1)
Sodium: 136 mmol/L (ref 135–145)
Sodium: 134 mmol/L — ABNORMAL LOW (ref 135–145)
Sodium: 140 mmol/L (ref 135–145)
Sodium: 136 mmol/L (ref 135–145)
Sodium: 137 mmol/L (ref 135–145)
TCO2: 27 mmol/L (ref 22–32)
TCO2: 22 mmol/L (ref 22–32)
TCO2: 23 mmol/L (ref 22–32)
TCO2: 26 mmol/L (ref 22–32)
TCO2: 26 mmol/L (ref 22–32)

## 2023-05-11 LAB — GLUCOSE, CAPILLARY
Glucose-Capillary: 127 mg/dL — ABNORMAL HIGH (ref 70–99)
Glucose-Capillary: 137 mg/dL — ABNORMAL HIGH (ref 70–99)
Glucose-Capillary: 86 mg/dL (ref 70–99)
Glucose-Capillary: 89 mg/dL (ref 70–99)
Glucose-Capillary: 109 mg/dL — ABNORMAL HIGH (ref 70–99)
Glucose-Capillary: 117 mg/dL — ABNORMAL HIGH (ref 70–99)
Glucose-Capillary: 119 mg/dL — ABNORMAL HIGH (ref 70–99)
Glucose-Capillary: 125 mg/dL — ABNORMAL HIGH (ref 70–99)
Glucose-Capillary: 127 mg/dL — ABNORMAL HIGH (ref 70–99)
Glucose-Capillary: 117 mg/dL — ABNORMAL HIGH (ref 70–99)
Glucose-Capillary: 139 mg/dL — ABNORMAL HIGH (ref 70–99)
Glucose-Capillary: 112 mg/dL — ABNORMAL HIGH (ref 70–99)

## 2023-05-11 LAB — PROTIME-INR
INR: 1.4 — ABNORMAL HIGH (ref 0.8–1.2)
Prothrombin Time: 17.3 s — ABNORMAL HIGH (ref 11.4–15.2)

## 2023-05-11 LAB — CBC
HCT: 25.9 % — ABNORMAL LOW (ref 36.0–46.0)
HCT: 24.6 % — ABNORMAL LOW (ref 36.0–46.0)
Hemoglobin: 8.4 g/dL — ABNORMAL LOW (ref 12.0–15.0)
Hemoglobin: 8.1 g/dL — ABNORMAL LOW (ref 12.0–15.0)
MCH: 27.9 pg (ref 26.0–34.0)
MCH: 27.7 pg (ref 26.0–34.0)
MCHC: 32.4 g/dL (ref 30.0–36.0)
MCHC: 32.9 g/dL (ref 30.0–36.0)
MCV: 86 fL (ref 80.0–100.0)
MCV: 84.2 fL (ref 80.0–100.0)
Platelets: 137 10*3/uL — ABNORMAL LOW (ref 150–400)
Platelets: 145 10*3/uL — ABNORMAL LOW (ref 150–400)
RBC: 3.01 MIL/uL — ABNORMAL LOW (ref 3.87–5.11)
RBC: 2.92 MIL/uL — ABNORMAL LOW (ref 3.87–5.11)
RDW: 15.7 % — ABNORMAL HIGH (ref 11.5–15.5)
RDW: 15.7 % — ABNORMAL HIGH (ref 11.5–15.5)
WBC: 8.8 10*3/uL (ref 4.0–10.5)
WBC: 8.2 10*3/uL (ref 4.0–10.5)
nRBC: 0 % (ref 0.0–0.2)
nRBC: 0 % (ref 0.0–0.2)

## 2023-05-11 LAB — HEMOGLOBIN AND HEMATOCRIT, BLOOD
HCT: 23.5 % — ABNORMAL LOW (ref 36.0–46.0)
Hemoglobin: 7.8 g/dL — ABNORMAL LOW (ref 12.0–15.0)

## 2023-05-11 LAB — BASIC METABOLIC PANEL
Anion gap: 8 (ref 5–15)
BUN: 15 mg/dL (ref 8–23)
CO2: 24 mmol/L (ref 22–32)
Calcium: 7.3 mg/dL — ABNORMAL LOW (ref 8.9–10.3)
Chloride: 106 mmol/L (ref 98–111)
Creatinine, Ser: 1.02 mg/dL — ABNORMAL HIGH (ref 0.44–1.00)
GFR, Estimated: >60 mL/min (ref >60)
Glucose, Bld: 124 mg/dL — ABNORMAL HIGH (ref 70–99)
Potassium: 4.3 mmol/L (ref 3.5–5.1)
Sodium: 138 mmol/L (ref 135–145)

## 2023-05-11 LAB — PLATELET COUNT: Platelets: 146 10*3/uL — ABNORMAL LOW (ref 150–400)

## 2023-05-11 LAB — MAGNESIUM: Magnesium: 4 mg/dL — ABNORMAL HIGH (ref 1.7–2.4)

## 2023-05-11 LAB — APTT: aPTT: 37 s — ABNORMAL HIGH (ref 24–36)

## 2023-05-11 LAB — ABO/RH: ABO/RH(D): A POS

## 2023-05-11 SURGERY — REPLACEMENT, MITRAL VALVE
Anesthesia: General | Site: Chest

## 2023-05-11 MED ORDER — PLASMA-LYTE A IV SOLN: INTRAVENOUS | Status: DC | PRN

## 2023-05-11 MED ORDER — CHLORHEXIDINE GLUCONATE 0.12 % MT SOLN
15.0000 mL | OROMUCOSAL | Status: AC
  Administered 2023-05-11: 15 mL via OROMUCOSAL
  Filled 2023-05-11: qty 15

## 2023-05-11 MED ORDER — METOPROLOL TARTRATE 5 MG/5ML IV SOLN: 2.5000 mg | INTRAVENOUS | Status: DC | PRN

## 2023-05-11 MED ORDER — CEFAZOLIN SODIUM-DEXTROSE 2-4 GM/100ML-% IV SOLN
2.0000 g | Freq: Three times a day (TID) | INTRAVENOUS | Status: AC
Start: 2023-05-11 — End: 2023-05-13
  Administered 2023-05-11 – 2023-05-13 (×6): 2 g via INTRAVENOUS
  Filled 2023-05-11 (×6): qty 100

## 2023-05-11 MED ORDER — LACTATED RINGERS IV SOLN: INTRAVENOUS | Status: DC | PRN

## 2023-05-11 MED ORDER — FENTANYL CITRATE (PF) 250 MCG/5ML IJ SOLN
INTRAMUSCULAR | Status: DC | PRN
  Administered 2023-05-11: 150 ug via INTRAVENOUS
  Administered 2023-05-11: 100 ug via INTRAVENOUS
  Administered 2023-05-11 (×3): 50 ug via INTRAVENOUS

## 2023-05-11 MED ORDER — FENTANYL CITRATE (PF) 250 MCG/5ML IJ SOLN
INTRAMUSCULAR | Status: AC
  Filled 2023-05-11: qty 5

## 2023-05-11 MED ORDER — FLUTICASONE FUROATE-VILANTEROL 200-25 MCG/ACT IN AEPB: 1.0000 | INHALATION_SPRAY | Freq: Every day | RESPIRATORY_TRACT | Status: DC

## 2023-05-11 MED ORDER — ROCURONIUM BROMIDE 10 MG/ML (PF) SYRINGE
PREFILLED_SYRINGE | INTRAVENOUS | Status: DC | PRN
  Administered 2023-05-11: 50 mg via INTRAVENOUS
  Administered 2023-05-11: 100 mg via INTRAVENOUS

## 2023-05-11 MED ORDER — LIDOCAINE 2% (20 MG/ML) 5 ML SYRINGE
INTRAMUSCULAR | Status: AC
  Filled 2023-05-11: qty 5

## 2023-05-11 MED ORDER — DOCUSATE SODIUM 100 MG PO CAPS
200.0000 mg | ORAL_CAPSULE | Freq: Every day | ORAL | Status: DC
  Administered 2023-05-12 – 2023-05-22 (×9): 200 mg via ORAL
  Filled 2023-05-11 (×11): qty 2

## 2023-05-11 MED ORDER — GABAPENTIN 400 MG PO CAPS
800.0000 mg | ORAL_CAPSULE | Freq: Three times a day (TID) | ORAL | Status: DC
  Administered 2023-05-11 – 2023-05-15 (×13): 800 mg via ORAL
  Filled 2023-05-11 (×13): qty 2

## 2023-05-11 MED ORDER — METOPROLOL TARTRATE 12.5 MG HALF TABLET
12.5000 mg | ORAL_TABLET | Freq: Two times a day (BID) | ORAL | Status: DC
  Filled 2023-05-11: qty 1

## 2023-05-11 MED ORDER — ASPIRIN 81 MG PO CHEW
324.0000 mg | CHEWABLE_TABLET | Freq: Once | ORAL | Status: AC
  Administered 2023-05-11: 324 mg via ORAL
  Filled 2023-05-11: qty 4

## 2023-05-11 MED ORDER — MIDAZOLAM HCL 2 MG/2ML IJ SOLN
INTRAMUSCULAR | Status: AC
  Filled 2023-05-11: qty 2

## 2023-05-11 MED ORDER — SODIUM BICARBONATE 8.4 % IV SOLN
50.0000 meq | Freq: Once | INTRAVENOUS | Status: AC
  Administered 2023-05-11: 50 meq via INTRAVENOUS

## 2023-05-11 MED ORDER — METHADONE HCL IV SYRINGE 10 MG/ML FOR CABG
0.3000 mg/kg | Freq: Once | INTRAMUSCULAR | Status: AC
Start: 2023-05-11 — End: 2023-05-11
  Administered 2023-05-11: 15 mg via INTRAVENOUS
  Filled 2023-05-11: qty 1.5

## 2023-05-11 MED ORDER — INSULIN REGULAR(HUMAN) IN NACL 100-0.9 UT/100ML-% IV SOLN: INTRAVENOUS | Status: DC

## 2023-05-11 MED ORDER — SODIUM CHLORIDE 0.9% FLUSH: 3.0000 mL | INTRAVENOUS | Status: DC | PRN

## 2023-05-11 MED ORDER — ORAL CARE MOUTH RINSE
15.0000 mL | OROMUCOSAL | Status: DC
  Administered 2023-05-11 – 2023-05-12 (×14): 15 mL via OROMUCOSAL

## 2023-05-11 MED ORDER — MIDAZOLAM HCL 2 MG/2ML IJ SOLN
2.0000 mg | INTRAMUSCULAR | Status: DC | PRN
  Filled 2023-05-11: qty 2

## 2023-05-11 MED ORDER — PHENYLEPHRINE HCL (PRESSORS) 10 MG/ML IV SOLN
INTRAVENOUS | Status: DC | PRN
  Administered 2023-05-11: 80 ug via INTRAVENOUS
  Administered 2023-05-11: 40 ug via INTRAVENOUS
  Administered 2023-05-11: 80 ug via INTRAVENOUS

## 2023-05-11 MED ORDER — METOPROLOL TARTRATE 25 MG/10 ML ORAL SUSPENSION: 12.5000 mg | Freq: Two times a day (BID) | ORAL | Status: DC

## 2023-05-11 MED ORDER — ONDANSETRON HCL 4 MG/2ML IJ SOLN
INTRAMUSCULAR | Status: AC
  Filled 2023-05-11: qty 4

## 2023-05-11 MED ORDER — SODIUM CHLORIDE 0.9% FLUSH
3.0000 mL | Freq: Two times a day (BID) | INTRAVENOUS | Status: DC
  Administered 2023-05-12 – 2023-05-22 (×20): 3 mL via INTRAVENOUS

## 2023-05-11 MED ORDER — ALBUTEROL SULFATE HFA 108 (90 BASE) MCG/ACT IN AERS
INHALATION_SPRAY | RESPIRATORY_TRACT | Status: DC | PRN
  Administered 2023-05-11: 4 via RESPIRATORY_TRACT

## 2023-05-11 MED ORDER — SODIUM BICARBONATE 8.4 % IV SOLN: 50.0000 meq | Freq: Once | INTRAVENOUS | Status: DC

## 2023-05-11 MED ORDER — CALCIUM CHLORIDE 10 % IV SOLN
INTRAVENOUS | Status: AC
  Filled 2023-05-11: qty 10

## 2023-05-11 MED ORDER — ACETAMINOPHEN 500 MG PO TABS
1000.0000 mg | ORAL_TABLET | Freq: Four times a day (QID) | ORAL | Status: DC
Start: 2023-05-12 — End: 2023-05-16
  Administered 2023-05-12 (×2): 500 mg via ORAL
  Administered 2023-05-12 (×2): 1000 mg via ORAL
  Filled 2023-05-11 (×7): qty 2

## 2023-05-11 MED ORDER — LACTATED RINGERS IV SOLN: INTRAVENOUS | Status: AC

## 2023-05-11 MED ORDER — SODIUM CHLORIDE 0.9 % IV SOLN: INTRAVENOUS | Status: DC | PRN

## 2023-05-11 MED ORDER — POTASSIUM CHLORIDE 10 MEQ/50ML IV SOLN: 10.0000 meq | INTRAVENOUS | Status: AC

## 2023-05-11 MED ORDER — FENTANYL CITRATE PF 50 MCG/ML IJ SOSY
50.0000 ug | PREFILLED_SYRINGE | INTRAMUSCULAR | Status: DC | PRN
  Administered 2023-05-11: 50 ug via INTRAVENOUS
  Filled 2023-05-11: qty 1

## 2023-05-11 MED ORDER — NITROGLYCERIN IN D5W 200-5 MCG/ML-% IV SOLN: 0.0000 ug/min | INTRAVENOUS | Status: DC

## 2023-05-11 MED ORDER — AMIODARONE HCL 200 MG PO TABS
400.0000 mg | ORAL_TABLET | Freq: Two times a day (BID) | ORAL | Status: AC
  Administered 2023-05-12 – 2023-05-16 (×10): 400 mg via ORAL
  Filled 2023-05-11 (×10): qty 2

## 2023-05-11 MED ORDER — ACETAMINOPHEN 160 MG/5ML PO SOLN
1000.0000 mg | Freq: Four times a day (QID) | ORAL | Status: DC
  Filled 2023-05-11: qty 40.6

## 2023-05-11 MED ORDER — PROTAMINE SULFATE 10 MG/ML IV SOLN
INTRAVENOUS | Status: AC
  Filled 2023-05-11: qty 25

## 2023-05-11 MED ORDER — ORAL CARE MOUTH RINSE: 15.0000 mL | OROMUCOSAL | Status: DC | PRN

## 2023-05-11 MED ORDER — BUDESONIDE 0.25 MG/2ML IN SUSP
0.2500 mg | Freq: Two times a day (BID) | RESPIRATORY_TRACT | Status: DC
  Administered 2023-05-11 – 2023-05-22 (×20): 0.25 mg via RESPIRATORY_TRACT
  Filled 2023-05-11 (×22): qty 2

## 2023-05-11 MED ORDER — PLASMA-LYTE A IV SOLN
INTRAVENOUS | Status: DC
  Filled 2023-05-11: qty 5

## 2023-05-11 MED ORDER — METHOCARBAMOL 500 MG PO TABS: 500.0000 mg | ORAL_TABLET | Freq: Three times a day (TID) | ORAL | Status: DC | PRN

## 2023-05-11 MED ORDER — SODIUM CHLORIDE 0.9 % IV SOLN: INTRAVENOUS | Status: DC

## 2023-05-11 MED ORDER — METOPROLOL TARTRATE 12.5 MG HALF TABLET
12.5000 mg | ORAL_TABLET | Freq: Once | ORAL | Status: AC
  Administered 2023-05-11: 12.5 mg via ORAL
  Filled 2023-05-11: qty 1

## 2023-05-11 MED ORDER — ALBUMIN HUMAN 5 % IV SOLN: INTRAVENOUS | Status: DC | PRN

## 2023-05-11 MED ORDER — VANCOMYCIN HCL 1000 MG IV SOLR
INTRAVENOUS | Status: DC | PRN
  Administered 2023-05-11: 1000 mL

## 2023-05-11 MED ORDER — VANCOMYCIN HCL IN DEXTROSE 1-5 GM/200ML-% IV SOLN
1000.0000 mg | Freq: Once | INTRAVENOUS | Status: AC
  Administered 2023-05-11: 1000 mg via INTRAVENOUS
  Filled 2023-05-11: qty 200

## 2023-05-11 MED ORDER — ARFORMOTEROL TARTRATE 15 MCG/2ML IN NEBU
15.0000 ug | INHALATION_SOLUTION | Freq: Two times a day (BID) | RESPIRATORY_TRACT | Status: DC
Start: 2023-05-11 — End: 2023-05-22
  Administered 2023-05-11 – 2023-05-22 (×20): 15 ug via RESPIRATORY_TRACT
  Filled 2023-05-11 (×23): qty 2

## 2023-05-11 MED ORDER — DEXTROSE 50 % IV SOLN
INTRAVENOUS | Status: DC | PRN
  Administered 2023-05-11: 12.5 g via INTRAVENOUS

## 2023-05-11 MED ORDER — HEMOSTATIC AGENTS (NO CHARGE) OPTIME
TOPICAL | Status: DC | PRN
  Administered 2023-05-11: 1 via TOPICAL

## 2023-05-11 MED ORDER — PROPOFOL 10 MG/ML IV BOLUS
INTRAVENOUS | Status: DC | PRN
  Administered 2023-05-11 (×2): 20 mg via INTRAVENOUS

## 2023-05-11 MED ORDER — SUGAMMADEX SODIUM 200 MG/2ML IV SOLN
INTRAVENOUS | Status: DC | PRN
  Administered 2023-05-11: 200 mg via INTRAVENOUS

## 2023-05-11 MED ORDER — PROPOFOL 10 MG/ML IV BOLUS
INTRAVENOUS | Status: AC
  Filled 2023-05-11: qty 20

## 2023-05-11 MED ORDER — ONDANSETRON HCL 4 MG/2ML IJ SOLN
4.0000 mg | Freq: Four times a day (QID) | INTRAMUSCULAR | Status: DC | PRN
Start: 2023-05-11 — End: 2023-05-22
  Administered 2023-05-14 – 2023-05-21 (×4): 4 mg via INTRAVENOUS
  Filled 2023-05-11 (×4): qty 2

## 2023-05-11 MED ORDER — PHENYLEPHRINE 80 MCG/ML (10ML) SYRINGE FOR IV PUSH (FOR BLOOD PRESSURE SUPPORT)
PREFILLED_SYRINGE | INTRAVENOUS | Status: DC | PRN
  Administered 2023-05-11: 80 ug via INTRAVENOUS

## 2023-05-11 MED ORDER — SODIUM CHLORIDE 0.9 % IV SOLN: 250.0000 mL | INTRAVENOUS | Status: AC

## 2023-05-11 MED ORDER — BISACODYL 5 MG PO TBEC
10.0000 mg | DELAYED_RELEASE_TABLET | Freq: Every day | ORAL | Status: DC
  Administered 2023-05-12 – 2023-05-22 (×5): 10 mg via ORAL
  Filled 2023-05-11 (×9): qty 2

## 2023-05-11 MED ORDER — METOCLOPRAMIDE HCL 5 MG/ML IJ SOLN
10.0000 mg | Freq: Four times a day (QID) | INTRAMUSCULAR | Status: AC
  Administered 2023-05-11 – 2023-05-12 (×6): 10 mg via INTRAVENOUS
  Filled 2023-05-11 (×5): qty 2

## 2023-05-11 MED ORDER — ASPIRIN 81 MG PO CHEW
324.0000 mg | CHEWABLE_TABLET | Freq: Every day | ORAL | Status: DC
  Filled 2023-05-11: qty 4

## 2023-05-11 MED ORDER — EPHEDRINE SULFATE-NACL 50-0.9 MG/10ML-% IV SOSY
PREFILLED_SYRINGE | INTRAVENOUS | Status: DC | PRN
  Administered 2023-05-11: 5 mg via INTRAVENOUS

## 2023-05-11 MED ORDER — UMECLIDINIUM BROMIDE 62.5 MCG/ACT IN AEPB: 1.0000 | INHALATION_SPRAY | Freq: Every day | RESPIRATORY_TRACT | Status: DC

## 2023-05-11 MED ORDER — CHLORHEXIDINE GLUCONATE CLOTH 2 % EX PADS
6.0000 | MEDICATED_PAD | Freq: Every day | CUTANEOUS | Status: DC
  Administered 2023-05-11 – 2023-05-16 (×6): 6 via TOPICAL

## 2023-05-11 MED ORDER — GABAPENTIN 400 MG PO CAPS: 800.0000 mg | ORAL_CAPSULE | Freq: Three times a day (TID) | ORAL | Status: DC

## 2023-05-11 MED ORDER — CHLORHEXIDINE GLUCONATE 4 % EX SOLN: 30.0000 mL | CUTANEOUS | Status: DC

## 2023-05-11 MED ORDER — DEXMEDETOMIDINE HCL IN NACL 400 MCG/100ML IV SOLN: 0.0000 ug/kg/h | INTRAVENOUS | Status: DC

## 2023-05-11 MED ORDER — EPHEDRINE 5 MG/ML INJ
INTRAVENOUS | Status: AC
  Filled 2023-05-11: qty 5

## 2023-05-11 MED ORDER — TRAMADOL HCL 50 MG PO TABS
50.0000 mg | ORAL_TABLET | ORAL | Status: DC | PRN
  Administered 2023-05-11 – 2023-05-12 (×3): 100 mg via ORAL
  Administered 2023-05-13: 50 mg via ORAL
  Administered 2023-05-14 – 2023-05-21 (×2): 100 mg via ORAL
  Filled 2023-05-11: qty 2
  Filled 2023-05-11: qty 1
  Filled 2023-05-11 (×2): qty 2
  Filled 2023-05-11 (×2): qty 1
  Filled 2023-05-11: qty 2

## 2023-05-11 MED ORDER — HEPARIN SODIUM (PORCINE) 1000 UNIT/ML IJ SOLN
INTRAMUSCULAR | Status: DC | PRN
  Administered 2023-05-11: 30000 [IU] via INTRAVENOUS

## 2023-05-11 MED ORDER — LIDOCAINE HCL (CARDIAC) PF 100 MG/5ML IV SOSY
PREFILLED_SYRINGE | INTRAVENOUS | Status: DC | PRN
  Administered 2023-05-11: 100 mg via INTRATRACHEAL

## 2023-05-11 MED ORDER — REVEFENACIN 175 MCG/3ML IN SOLN
175.0000 ug | Freq: Every day | RESPIRATORY_TRACT | Status: DC
Start: 2023-05-11 — End: 2023-05-22
  Administered 2023-05-11 – 2023-05-22 (×10): 175 ug via RESPIRATORY_TRACT
  Filled 2023-05-11 (×12): qty 3

## 2023-05-11 MED ORDER — MIDAZOLAM HCL (PF) 5 MG/ML IJ SOLN
INTRAMUSCULAR | Status: DC | PRN
  Administered 2023-05-11: 1 mg via INTRAVENOUS

## 2023-05-11 MED ORDER — SODIUM CHLORIDE 0.45 % IV SOLN: INTRAVENOUS | Status: DC | PRN

## 2023-05-11 MED ORDER — PROTAMINE SULFATE 10 MG/ML IV SOLN
INTRAVENOUS | Status: DC | PRN
  Administered 2023-05-11: 300 mg via INTRAVENOUS

## 2023-05-11 MED ORDER — CLONAZEPAM 1 MG PO TABS: 1.0000 mg | ORAL_TABLET | Freq: Three times a day (TID) | ORAL | Status: DC

## 2023-05-11 MED ORDER — PANTOPRAZOLE SODIUM 40 MG IV SOLR
40.0000 mg | Freq: Every day | INTRAVENOUS | Status: AC
  Administered 2023-05-11 – 2023-05-12 (×2): 40 mg via INTRAVENOUS
  Filled 2023-05-11 (×2): qty 10

## 2023-05-11 MED ORDER — PHENYLEPHRINE HCL-NACL 20-0.9 MG/250ML-% IV SOLN: 0.0000 ug/min | INTRAVENOUS | Status: DC

## 2023-05-11 MED ORDER — PHENYLEPHRINE 80 MCG/ML (10ML) SYRINGE FOR IV PUSH (FOR BLOOD PRESSURE SUPPORT)
PREFILLED_SYRINGE | INTRAVENOUS | Status: AC
  Filled 2023-05-11: qty 10

## 2023-05-11 MED ORDER — CHLORHEXIDINE GLUCONATE 0.12 % MT SOLN
15.0000 mL | Freq: Once | OROMUCOSAL | Status: AC
  Administered 2023-05-11: 15 mL via OROMUCOSAL
  Filled 2023-05-11: qty 15

## 2023-05-11 MED ORDER — CLONAZEPAM 0.5 MG PO TABS
1.0000 mg | ORAL_TABLET | Freq: Three times a day (TID) | ORAL | Status: DC
  Administered 2023-05-11 – 2023-05-22 (×32): 1 mg via ORAL
  Filled 2023-05-11 (×2): qty 2
  Filled 2023-05-11: qty 1
  Filled 2023-05-11 (×2): qty 2
  Filled 2023-05-11: qty 1
  Filled 2023-05-11: qty 2
  Filled 2023-05-11: qty 1
  Filled 2023-05-11 (×5): qty 2
  Filled 2023-05-11 (×3): qty 1
  Filled 2023-05-11 (×3): qty 2
  Filled 2023-05-11 (×2): qty 1
  Filled 2023-05-11: qty 2
  Filled 2023-05-11 (×2): qty 1
  Filled 2023-05-11: qty 2
  Filled 2023-05-11: qty 1
  Filled 2023-05-11 (×2): qty 2
  Filled 2023-05-11: qty 1
  Filled 2023-05-11: qty 2
  Filled 2023-05-11 (×2): qty 1

## 2023-05-11 MED ORDER — FENTANYL CITRATE PF 50 MCG/ML IJ SOSY
50.0000 ug | PREFILLED_SYRINGE | INTRAMUSCULAR | Status: DC | PRN
  Administered 2023-05-11 (×4): 50 ug via INTRAVENOUS
  Filled 2023-05-11 (×4): qty 1

## 2023-05-11 MED ORDER — CHLORHEXIDINE GLUCONATE 0.12 % MT SOLN
15.0000 mL | Freq: Once | OROMUCOSAL | Status: AC
  Administered 2023-05-11: 15 mL via OROMUCOSAL

## 2023-05-11 MED ORDER — 0.9 % SODIUM CHLORIDE (POUR BTL) OPTIME
TOPICAL | Status: DC | PRN
  Administered 2023-05-11: 5000 mL

## 2023-05-11 MED ORDER — HEPARIN SODIUM (PORCINE) 1000 UNIT/ML IJ SOLN
INTRAMUSCULAR | Status: AC
  Filled 2023-05-11: qty 1

## 2023-05-11 MED ORDER — METHOCARBAMOL 500 MG PO TABS
500.0000 mg | ORAL_TABLET | Freq: Three times a day (TID) | ORAL | Status: DC | PRN
  Administered 2023-05-11 – 2023-05-22 (×9): 500 mg via ORAL
  Filled 2023-05-11 (×9): qty 1

## 2023-05-11 MED ORDER — INSULIN ASPART 100 UNIT/ML IJ SOLN: 0.0000 [IU] | INTRAMUSCULAR | Status: DC | PRN

## 2023-05-11 MED ORDER — MAGNESIUM SULFATE 4 GM/100ML IV SOLN
4.0000 g | Freq: Once | INTRAVENOUS | Status: AC
  Administered 2023-05-11: 4 g via INTRAVENOUS
  Filled 2023-05-11: qty 100

## 2023-05-11 MED ORDER — IPRATROPIUM-ALBUTEROL 0.5-2.5 (3) MG/3ML IN SOLN
3.0000 mL | RESPIRATORY_TRACT | Status: DC | PRN
  Administered 2023-05-14 – 2023-05-16 (×5): 3 mL via RESPIRATORY_TRACT
  Filled 2023-05-11 (×5): qty 3

## 2023-05-11 MED ORDER — ALBUMIN HUMAN 5 % IV SOLN
250.0000 mL | INTRAVENOUS | Status: DC | PRN
  Administered 2023-05-11 (×2): 12.5 g via INTRAVENOUS

## 2023-05-11 MED ORDER — ASPIRIN 325 MG PO TBEC
325.0000 mg | DELAYED_RELEASE_TABLET | Freq: Every day | ORAL | Status: DC
  Administered 2023-05-12 – 2023-05-15 (×4): 325 mg via ORAL
  Filled 2023-05-11 (×4): qty 1

## 2023-05-11 MED ORDER — BISACODYL 10 MG RE SUPP
10.0000 mg | Freq: Every day | RECTAL | Status: DC
  Filled 2023-05-11 (×3): qty 1

## 2023-05-11 MED ORDER — PANTOPRAZOLE SODIUM 40 MG PO TBEC
40.0000 mg | DELAYED_RELEASE_TABLET | Freq: Every day | ORAL | Status: DC
  Administered 2023-05-13 – 2023-05-22 (×10): 40 mg via ORAL
  Filled 2023-05-11 (×10): qty 1

## 2023-05-11 MED ORDER — PROTAMINE SULFATE 10 MG/ML IV SOLN
INTRAVENOUS | Status: AC
  Filled 2023-05-11: qty 5

## 2023-05-11 MED ORDER — DEXTROSE 50 % IV SOLN: 0.0000 mL | INTRAVENOUS | Status: DC | PRN

## 2023-05-11 MED ORDER — ROSUVASTATIN CALCIUM 20 MG PO TABS
40.0000 mg | ORAL_TABLET | Freq: Every day | ORAL | Status: DC
  Administered 2023-05-12 – 2023-05-21 (×10): 40 mg via ORAL
  Filled 2023-05-11 (×10): qty 2

## 2023-05-11 MED ORDER — ~~LOC~~ CARDIAC SURGERY, PATIENT & FAMILY EDUCATION
Freq: Once | Status: DC
  Filled 2023-05-11: qty 1

## 2023-05-11 MED ORDER — SERTRALINE HCL 50 MG PO TABS
50.0000 mg | ORAL_TABLET | Freq: Every morning | ORAL | Status: DC
  Administered 2023-05-12 – 2023-05-22 (×11): 50 mg via ORAL
  Filled 2023-05-11 (×11): qty 1

## 2023-05-11 MED ORDER — ACETAMINOPHEN 160 MG/5ML PO SOLN
650.0000 mg | Freq: Once | ORAL | Status: AC
  Administered 2023-05-11: 650 mg

## 2023-05-11 MED ORDER — DEXTROSE 50 % IV SOLN
INTRAVENOUS | Status: AC
  Filled 2023-05-11: qty 50

## 2023-05-11 MED ORDER — NOREPINEPHRINE 4 MG/250ML-% IV SOLN
0.0000 ug/min | INTRAVENOUS | Status: DC
  Administered 2023-05-11: 6 ug/min via INTRAVENOUS
  Administered 2023-05-12: 8 ug/min via INTRAVENOUS
  Administered 2023-05-12: 7 ug/min via INTRAVENOUS
  Administered 2023-05-13: 10 ug/min via INTRAVENOUS
  Filled 2023-05-11 (×4): qty 250

## 2023-05-11 MED ORDER — ROCURONIUM BROMIDE 10 MG/ML (PF) SYRINGE
PREFILLED_SYRINGE | INTRAVENOUS | Status: AC
  Filled 2023-05-11: qty 20

## 2023-05-11 MED ORDER — ONDANSETRON HCL 4 MG/2ML IJ SOLN
INTRAMUSCULAR | Status: DC | PRN
  Administered 2023-05-11: 8 mg via INTRAVENOUS

## 2023-05-11 SURGICAL SUPPLY — 82 items
ADAPTER CARDIO PERF ANTE/RETRO (ADAPTER) IMPLANT
ANTIFOG SOL W/FOAM PAD STRL (MISCELLANEOUS) ×2 IMPLANT
BAG DECANTER FOR FLEXI CONT (MISCELLANEOUS) ×2 IMPLANT
BLADE STERNUM SYSTEM 6 (BLADE) ×2 IMPLANT
BLADE SURG 11 STRL SS (BLADE) ×1 IMPLANT
CANISTER SUCT 3000ML PPV (MISCELLANEOUS) ×2 IMPLANT
CANN PRFSN 3/8XCNCT ST RT ANG (MISCELLANEOUS) ×2 IMPLANT
CANN PRFSN 3/8XRT ANG TPR 14 (MISCELLANEOUS) IMPLANT
CANNULA NON VENT 20FR 12 (CANNULA) ×2 IMPLANT
CANNULA NON VENT 22FR 12 (CANNULA) ×2 IMPLANT
CANNULA PRFSN 3/8XCNCT RT ANG (MISCELLANEOUS) ×1 IMPLANT
CANNULA PRFSN 3/8XRT ANG TPR14 (MISCELLANEOUS) IMPLANT
CANNULA SUMP PERICARDIAL (CANNULA) ×2 IMPLANT
CANNULA VRC MALB SNGL STG 36FR (MISCELLANEOUS) ×1 IMPLANT
CATH HEART VENT LEFT (CATHETERS) ×2 IMPLANT
CATH RETROPLEGIA CORONARY 14FR (CATHETERS) IMPLANT
CATH ROBINSON RED A/P 18FR (CATHETERS) ×7 IMPLANT
CATH THOR STR 32F SOFT 20 RADI (CATHETERS) ×2 IMPLANT
CATH THORACIC 28FR RT ANG (CATHETERS) ×2 IMPLANT
CLAMP SUTURE YELLOW 5 PAIRS (MISCELLANEOUS) ×2 IMPLANT
CLIP TI MEDIUM 24 (CLIP) IMPLANT
CLIP TI WIDE RED SMALL 24 (CLIP) IMPLANT
CONN Y 3/8X3/8X3/8 BEN (MISCELLANEOUS) ×1 IMPLANT
DEV SUMP PERICARDIAL 20F 15IN (MISCELLANEOUS) ×1 IMPLANT
DEVICE SUT CK QUICK LOAD MINI (Prosthesis & Implant Heart) ×1 IMPLANT
DRAIN CONNECTOR BLAKE 1:1 (MISCELLANEOUS) ×1 IMPLANT
DRAPE CV SPLIT W-CLR ANES SCRN (DRAPES) ×2 IMPLANT
DRAPE INCISE IOBAN 66X45 STRL (DRAPES) ×2 IMPLANT
DRAPE PERI GROIN 82X75IN TIB (DRAPES) ×2 IMPLANT
DRSG AQUACEL AG ADV 3.5X10 (GAUZE/BANDAGES/DRESSINGS) ×2 IMPLANT
ELECT CAUTERY BLADE 6.4 (BLADE) ×2 IMPLANT
ELECT REM PT RETURN 9FT ADLT (ELECTROSURGICAL) ×4 IMPLANT
ELECTRODE REM PT RTRN 9FT ADLT (ELECTROSURGICAL) ×4 IMPLANT
FELT TEFLON 1X6 (MISCELLANEOUS) ×2 IMPLANT
FIBERTAPE STERNAL RADIOP BLUNT (SUTURE) ×4 IMPLANT
FIBERTAPE STERNAL RADIOP CUTT (SUTURE) ×3 IMPLANT
GAUZE SPONGE 4X4 12PLY STRL (GAUZE/BANDAGES/DRESSINGS) ×2 IMPLANT
GLOVE BIO SURGEON STRL SZ 6 (GLOVE) ×2 IMPLANT
GOWN STRL REUS W/ TWL LRG LVL3 (GOWN DISPOSABLE) ×12 IMPLANT
HEMOSTAT SURGICEL 2X14 (HEMOSTASIS) ×1 IMPLANT
INSERT FOGARTY XLG (MISCELLANEOUS) ×2 IMPLANT
KIT BASIN OR (CUSTOM PROCEDURE TRAY) ×2 IMPLANT
KIT SUT CK MINI COMBO 4X17 (Prosthesis & Implant Heart) ×1 IMPLANT
KIT TURNOVER KIT B (KITS) ×2 IMPLANT
LINE VENT (MISCELLANEOUS) ×1 IMPLANT
NS IRRIG 1000ML POUR BTL (IV SOLUTION) ×12 IMPLANT
ORGANIZER SUTURE GABBAY-FRATER (MISCELLANEOUS) ×2 IMPLANT
PACK E OPEN HEART (SUTURE) ×2 IMPLANT
PACK OPEN HEART (CUSTOM PROCEDURE TRAY) ×2 IMPLANT
PAD ARMBOARD 7.5X6 YLW CONV (MISCELLANEOUS) ×4 IMPLANT
PAD ELECT DEFIB RADIOL ZOLL (MISCELLANEOUS) ×2 IMPLANT
PENCIL BUTTON HOLSTER BLD 10FT (ELECTRODE) ×2 IMPLANT
POSITIONER HEAD DONUT 9IN (MISCELLANEOUS) ×2 IMPLANT
SET MPS 3-ND DEL (MISCELLANEOUS) ×1 IMPLANT
SOLUTION ANTFG W/FOAM PAD STRL (MISCELLANEOUS) ×2 IMPLANT
STRIP CLOSURE SKIN 1/2X4 (GAUZE/BANDAGES/DRESSINGS) ×1 IMPLANT
SUT ETHIBOND 2 0 SH (SUTURE) ×2 IMPLANT
SUT ETHIBOND 3 0 SH 1 (SUTURE) IMPLANT
SUT MNCRL AB 4-0 PS2 18 (SUTURE) ×4 IMPLANT
SUT PROLENE 4 0 SH DA (SUTURE) ×6 IMPLANT
SUT PROLENE 4-0 RB1 .5 CRCL 36 (SUTURE) ×4 IMPLANT
SUT PROLENE 5 0 RB 2 (SUTURE) IMPLANT
SUT PROLENE 6 0 C 1 30 (SUTURE) ×1 IMPLANT
SUT STEEL 6MS V (SUTURE) ×2 IMPLANT
SUT STEEL SZ 6 DBL 3X14 BALL (SUTURE) ×4 IMPLANT
SUT VIC AB 0 CTX36XBRD ANTBCTR (SUTURE) ×4 IMPLANT
SUT VIC AB 2-0 CT1 TAPERPNT 27 (SUTURE) ×4 IMPLANT
SYSTEM SAHARA CHEST DRAIN ATS (WOUND CARE) ×2 IMPLANT
TAG SUTURE CLAMP YLW 5PR (MISCELLANEOUS) ×2 IMPLANT
TAPE CLOTH SURG 4X10 WHT LF (GAUZE/BANDAGES/DRESSINGS) ×1 IMPLANT
TAPE PAPER 2X10 WHT MICROPORE (GAUZE/BANDAGES/DRESSINGS) ×1 IMPLANT
TENSIONER FIBERTAPE CERCLAGE (DISPOSABLE) ×2 IMPLANT
TOWEL GREEN STERILE (TOWEL DISPOSABLE) ×2 IMPLANT
TOWEL GREEN STERILE FF (TOWEL DISPOSABLE) ×2 IMPLANT
TRAY FOLEY SLVR 16FR TEMP STAT (SET/KITS/TRAYS/PACK) ×2 IMPLANT
TUBE CONNECTING 12X1/4 (SUCTIONS) ×1 IMPLANT
TUBE CONNECTING 20X1/4 (TUBING) ×1 IMPLANT
UNDERPAD 30X36 HEAVY ABSORB (UNDERPADS AND DIAPERS) ×2 IMPLANT
VALVE MITRAL SZ 29 (Prosthesis & Implant Heart) ×1 IMPLANT
VENT LEFT HEART 12002 (CATHETERS) ×2 IMPLANT
VRC MALLEABLE SINGLE STG 36FR (MISCELLANEOUS) ×2 IMPLANT
WATER STERILE IRR 1000ML POUR (IV SOLUTION) ×4 IMPLANT

## 2023-05-11 NOTE — Anesthesia Procedure Notes (Signed)
Procedure Name: Intubation Date/Time: 05/11/2023 7:55 AM  Performed by: Darryl Nestle, CRNAPre-anesthesia Checklist: Patient identified, Emergency Drugs available, Suction available and Patient being monitored Patient Re-evaluated:Patient Re-evaluated prior to induction Oxygen Delivery Method: Circle system utilized Preoxygenation: Pre-oxygenation with 100% oxygen Induction Type: IV induction Ventilation: Mask ventilation without difficulty and Oral airway inserted - appropriate to patient size Laryngoscope Size: Mac and 3 Grade View: Grade I Tube type: Oral Tube size: 8.0 mm Number of attempts: 1 Airway Equipment and Method: Stylet and Oral airway Placement Confirmation: ETT inserted through vocal cords under direct vision, positive ETCO2 and breath sounds checked- equal and bilateral Secured at: 22 cm Tube secured with: Tape Dental Injury: Teeth and Oropharynx as per pre-operative assessment

## 2023-05-11 NOTE — Brief Op Note (Signed)
05/11/2023  11:51 AM  PATIENT:  Rose [name]  67 y.o. female  PRE-OPERATIVE DIAGNOSIS:  RHEUMATIC MITRAL STENOSIS MITRAL REGURGITATION  SEVERE TRICUSPID REGURGITATION  POST-OPERATIVE DIAGNOSIS:  RHEUMATIC MITRAL STENOSISMITRAL REGURGITATION SEVERE TRICUSPID REGURGITATION  PROCEDURE:   MITRAL VALVE (MV) REPLACEMENT USING MOSAIC VALVE SIZE TRANSESOPHAGEAL ECHOCARDIOGRAM   SURGEON:  Surgeons and Role:    Eugenio Hoes, MD - Primary  PHYSICIAN ASSISTANT: Aloha Gell PA-C  ASSISTANTS: Virgilio Frees RNFA   ANESTHESIA:   general  EBL:   BLOOD ADMINISTERED:none  DRAINS:  Mediastinal drains    LOCAL MEDICATIONS USED:  NONE  SPECIMEN:  Source of Specimen:  Mitral valve  DISPOSITION OF SPECIMEN:  PATHOLOGY  COUNTS:  YES  DICTATION: .Dragon Dictation  PLAN OF CARE: Admit to inpatient   PATIENT DISPOSITION:  ICU - intubated and hemodynamically stable.   Delay start of Pharmacological VTE agent (>24hrs) due to surgical blood loss or risk of bleeding: yes

## 2023-05-11 NOTE — Interval H&P Note (Signed)
History and Physical Interval Note:  05/11/2023 6:33 AM  Rose [name]  has presented today for surgery, with the diagnosis of RHEUMATIC MITRAL STENOSIS MITRAL REGURGITATION  SEVERE TRICUSPID REGURGITATION.  The various methods of treatment have been discussed with the patient and family. After consideration of risks, benefits and other options for treatment, the patient has consented to  Procedure(s): MITRAL VALVE (MV) REPLACEMENT (N/A) TRICUSPID VALVE REPAIR (N/A) TRANSESOPHAGEAL ECHOCARDIOGRAM (N/A) as a surgical intervention.  The patient's history has been reviewed, patient examined, no change in status, stable for surgery.  I have reviewed the patient's chart and labs.  Questions were answered to the patient's satisfaction.     Eugenio Hoes

## 2023-05-11 NOTE — Progress Notes (Signed)
Patient ID: Rose [name], female   DOB: 09-18-1955, 66 y.o.   MRN: [id number]   TCTS Evening Rounds:   Hemodynamically stable  CI = 3  Extubated and alert, working on IS  Urine output good  CT output low  CBC    Component Value Date/Time   WBC 8.8 05/11/2023 1208   RBC 3.01 (L) 05/11/2023 1208   HGB 7.1 (L) 05/11/2023 1654   HGB 12.1 10/27/2022 1552   HCT 21.0 (L) 05/11/2023 1654   HCT 36.4 10/27/2022 1552   PLT 137 (L) 05/11/2023 1208   PLT 236 10/27/2022 1552   MCV 86.0 05/11/2023 1208   MCV 85 10/27/2022 1552   MCH 27.9 05/11/2023 1208   MCHC 32.4 05/11/2023 1208   RDW 15.7 (H) 05/11/2023 1208   RDW 15.6 (H) 10/27/2022 1552   LYMPHSABS 0.3 (L) 06/23/2021 0213   MONOABS 0.1 06/23/2021 0213   EOSABS 0.0 06/23/2021 0213   BASOSABS 0.0 06/23/2021 0213     BMET    Component Value Date/Time   NA 140 05/11/2023 1654   NA 140 10/27/2022 1552   K 4.3 05/11/2023 1654   CL 103 05/11/2023 1044   CO2 26 05/09/2023 1330   GLUCOSE 121 (H) 05/11/2023 1044   BUN 18 05/11/2023 1044   BUN 10 10/27/2022 1552   CREATININE 0.90 05/11/2023 1044   CALCIUM 9.5 05/09/2023 1330   EGFR 63 10/27/2022 1552   GFRNONAA 51 (L) 05/09/2023 1330     A/P:  Stable postop course. Continue current plans

## 2023-05-11 NOTE — Anesthesia Procedure Notes (Signed)
Arterial Line Insertion Start/End12/13/2024 7:00 AM, 05/11/2023 7:10 AM Performed by: Darryl Nestle, CRNA, CRNA  Patient location: Pre-op. Preanesthetic checklist: patient identified, IV checked, site marked, risks and benefits discussed, surgical consent, monitors and equipment checked, pre-op evaluation, timeout performed and anesthesia consent Lidocaine 1% used for infiltration Left, radial was placed Catheter size: 20 G Hand hygiene performed  and maximum sterile barriers used   Attempts: 2 Procedure performed without using ultrasound guided technique. Following insertion, dressing applied and Biopatch. Post procedure assessment: normal and unchanged  Post procedure complications: unsuccessful attempts. Patient tolerated the procedure well with no immediate complications.

## 2023-05-11 NOTE — Anesthesia Postprocedure Evaluation (Signed)
Anesthesia Post Note  Patient: Games developer  Procedure(s) Performed: MITRAL VALVE (MV) REPLACEMENT USING MOSAIC VALVE SIZE (Chest) TRANSESOPHAGEAL ECHOCARDIOGRAM (Chest)     Patient location during evaluation: ICU Anesthesia Type: General Level of consciousness: sedated Pain management: pain level controlled Vital Signs Assessment: post-procedure vital signs reviewed and stable Respiratory status: patient remains intubated per anesthesia plan and patient on ventilator - see flowsheet for VS Cardiovascular status: stable Postop Assessment: no apparent nausea or vomiting Anesthetic complications: no   No notable events documented.  Last Vitals:  Vitals:   05/11/23 1158 05/11/23 1200  BP:    Pulse: 80   Resp: 20   Temp:    SpO2: 98% 98%    Last Pain:  Vitals:   05/11/23 1914  PainSc: 8                  Mariann Barter

## 2023-05-11 NOTE — Procedures (Addendum)
Extubation Procedure Note  Patient Details:   Name: Rose [name] DOB: Feb 17, 1956 MRN: [id number]   Airway Documentation:    Vent end date: 05/11/23 Vent end time: 1715   Evaluation  O2 sats: stable throughout Complications: No apparent complications Patient did tolerate procedure well. Bilateral Breath Sounds: Clear, Rhonchi   Yes  Pt extubated per rapid wean protocol to 4L [place]. Pt passed all parameters with a NIF -20 and VC 0.62L. Pt able to state name and has a good cough.   Kerri Perches 05/11/2023, 5:25 PM

## 2023-05-11 NOTE — Anesthesia Procedure Notes (Signed)
Central Venous Catheter Insertion Performed by: [place] Nation, MD, anesthesiologist Start/End12/13/2024 6:40 AM, 05/11/2023 7:00 AM Patient location: Pre-op. Preanesthetic checklist: patient identified, IV checked, site marked, risks and benefits discussed, surgical consent, monitors and equipment checked, pre-op evaluation, timeout performed and anesthesia consent Position: Trendelenburg Lidocaine 1% used for infiltration and patient sedated Hand hygiene performed , maximum sterile barriers used  and Seldinger technique used Catheter size: 8 Fr Total catheter length 16. Central line was placed.Double lumen Procedure performed using ultrasound guided technique. Ultrasound Notes:anatomy identified, needle tip was noted to be adjacent to the nerve/plexus identified, no ultrasound evidence of intravascular and/or intraneural injection and image(s) printed for medical record Attempts: 1 Following insertion, dressing applied, line sutured and Biopatch. Post procedure assessment: blood return through all ports  Patient tolerated the procedure well with no immediate complications.

## 2023-05-11 NOTE — Anesthesia Preprocedure Evaluation (Signed)
Anesthesia Evaluation  Patient identified by MRN, date of birth, ID band Patient awake    Reviewed: Allergy & Precautions, NPO status , Patient's Chart, lab work & pertinent test results, reviewed documented beta blocker date and time   History of Anesthesia Complications Negative for: history of anesthetic complications  Airway Mallampati: III  TM Distance: >3 FB     Dental  (+) Poor Dentition   Pulmonary shortness of breath, with exertion and lying, asthma , sleep apnea , pneumonia, COPD,  COPD inhaler, Current Smoker    + decreased breath sounds      Cardiovascular (-) hypertension+ CAD  + Valvular Problems/Murmurs  Rhythm:Regular Rate:Normal + Systolic murmurs and + Diastolic murmurs Severe MS, severe MR, severe TR   Neuro/Psych  Headaches PSYCHIATRIC DISORDERS Anxiety Depression     Neuromuscular disease CVA, No Residual Symptoms    GI/Hepatic ,GERD  ,,(+) neg Cirrhosis        Endo/Other  diabetes, Type 2    Renal/GU      Musculoskeletal  (+) Arthritis ,    Abdominal   Peds  Hematology  (+) Blood dyscrasia, anemia   Anesthesia Other Findings   Reproductive/Obstetrics                              Anesthesia Physical Anesthesia Plan  ASA: 4  Anesthesia Plan: General   Post-op Pain Management:    Induction: Intravenous  PONV Risk Score and Plan: 2 and Ondansetron and Propofol infusion  Airway Management Planned: Oral ETT  Additional Equipment: Arterial line, TEE, PA Cath, 3D TEE and Ultrasound Guidance Line Placement  Intra-op Plan:   Post-operative Plan: Post-operative intubation/ventilation  Informed Consent: I have reviewed the patients History and Physical, chart, labs and discussed the procedure including the risks, benefits and alternatives for the proposed anesthesia with the patient or authorized representative who has indicated his/her understanding and  acceptance.     Dental advisory given  Plan Discussed with: CRNA  Anesthesia Plan Comments:          Anesthesia Quick Evaluation

## 2023-05-11 NOTE — Hospital Course (Addendum)
History of Present Illness:     [name] is known to Dr. 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 The patient has had completed work up for surgery. She is struggling with being evicted from her apartment and still has no place to live following surgery. Her symptoms include mostly back pain currently but she is able to perform her daily activities. She denies increasing DOE. She is very nervous about the surgery.  Dr. Leafy [name] reviewed the patient's diagnostic studies and determined she would benefit from surgery. He reviewed the treatment options as well as the risks and benefits with the patient. [name] was agreeable to proceed with surgery.  Hospital Course: [name] arrived at [facility] and was brought to the operating room on 05/11/23. She underwent mitral valve replacement utilizing a 29mm Mosaic Mitral valve. She tolerated the procedure well and was transferred to the SICU in stable condition.  Vital signs and hemodynamics remained stable after transfer.  She was weaned from the ventilator and extubated by 5:30 PM on the day of surgery.  By the first postoperative day, she was on low-dose norepinephrine.  She was started on midodrine for blood pressure support to allow for weaning of the norepinephrine.  The monitoring lines were removed on postop day 1.  Diuresis was begun.  On postop day 2, the pacer wires were removed and anticoagulation with Coumadin was initiated. She was mobilized. She required low dose norepinephrine for a short period, this was weaned as hemodynamics tolerated. She had expected postoperative acute blood loss anemia and was transfused with 1U PRBCs. She developed controlled rate atrial fibrillation but quickly converted to NSR with an Amiodarone bolus. She became overly sedated, home gabapentin was decreased. PT/OT evaluations recommended home health. She was felt stable for transfer to the  progressive unit on 12/17. Creatinine did increase slightly to 1.29 on 12/19. INR was up to 2.1 on 12/19 so Coumadin dose was decreased to 2 mg.  She has responded well to this regimen. She overall has poor respiratory effort.  Home ambulation test indicated need for home oxygen which has been arranged, the apteit has required home oxygen in the past. Chest xray obtained did not show evidence of significant pleural effusions.  She has responded well to lasix, but developed dizziness so her lasix and potassium was discontinued. She developed episodes of rate controlled A. Fib would convert to NSR without intervention. She remains hypotensive on Midodrine 10 mg TID. Her INR dropped from 2.7 to 2.2  to 2.1 on 2mg  of Coumadin daily. She was started on 2.5mg  Coumadin daily and discharged with an INR check 2 days later. Patient admitted she would have transportation issues for that appointment, home health arranged to check her INR at their visit on 12/27. Her surgical incisions were healing without evidence of infection.  She was improving in ambulation with assistance on 2L . Home PT/OT/RN have been arranged since she refused SNF.  She was felt stable for discharge home.

## 2023-05-11 NOTE — Op Note (Signed)
CARDIOVASCULAR SURGERY OPERATIVE NOTE  05/11/2023 Wilmoth Clohessy [id number]  Surgeon:  Ashley Akin, MD  First Assistant: Aloha Gell [facility]                               An experienced assistant was required given the complexity of this surgery and the standard of surgical care. The assistant was needed for exposure, dissection, suctioning, retraction of delicate tissues and sutures, instrument exchange and for overall help during this procedure.     Preoperative Diagnosis:  Rheumatic mitral stenosis and regurgitation                                            Tricuspid Regurgitation  Postoperative Diagnosis:  Same but Tricuspid annulus only 33mm and with mild to moderate TR and was not surgical addressed   Operation:  Mitral Valve replacement with a 29 mm Mosaic Mitral valve  Anesthesia:  General Endotracheal   Clinical History/Surgical Indication:  67 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 She is hopeful it will be soon. 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 She will see Cardiology in meantime of her arranging her housing issue. She will call if her symptoms worsen.    Findings: Ventricular function on both the left and right side had mild hypokinesia.  The mitral valve was severely regurgitant with moderate stenosis and had classic rheumatic fishmouth appearance and required resection of very thick papillary muscles in addition to the calcified leaflets.  Conclusion the valve was well-seated with a mean gradient of 2.  The tricuspid valve on TEE had mild to moderate regurgitation with a small annulus and was felt not to require surgical intervention.  Preparation:  The patient was seen in the preoperative holding area and the correct patient, correct operation were confirmed with the patient after reviewing the medical record and catheterization. The consent was  signed by me. Preoperative antibiotics were given. A pulmonary arterial line and radial arterial line were placed by the anesthesia team. The patient was taken back to the operating room and positioned supine on the operating room table. After being placed under general endotracheal anesthesia by the anesthesia team a foley catheter was placed. The neck, chest, abdomen, and both legs were prepped with betadine soap and solution and draped in the usual sterile manner. A surgical time-out was taken and the correct patient and operative procedure were confirmed with the nursing and anesthesia staff.  Operation: Median sternotomy incision was created and the sternum divided with a sternal saw.  A pericardial well was developed and the aortic cannulated with a 20 [place] Starns cannula.  A 36 French straight cannula was placed in the right atrial appendage and directed to the inferior vena cava and a 28 French right angle cannula was placed in the superior vena cava.  With adequate confirmation of anticoagulation cardiopulmonary bypass was instituted.  An antegrade cardioplegia catheter was placed in the ascending aorta and aortic cross-clamp was placed and cold blood [place] cardioplegia was delivered for 1300 cc antegrade.  Reanimation dose was delivered just prior to cross-clamp removal. The left atrium was opened in the intra-atrial groove and somewhat difficult exposure of the mitral valve was obtained.  Mitral valve was then  resected in total and 2-0 [place] pledgeted sutures were then placed circle manually with the pledgets on the atrial surface.  A 29 mm Mosaic tissue prosthesis was then inserted with the cor knot system on the left atrium was closed over a ventricular sump. The patient in headdown position aortic cross-clamp was removed and multiple de-airing maneuvers performed.  Ventricular and atrial pacing wires were placed about the inferior stab wounds and secured. Patient was then weaned from  cardiopulmonary bypass on a small amount of Levophed support and with adequate hemodynamics and valve function protamine was delivered and the patient was decannulated and sites oversewn were necessary.  Chest tubes were brought inferior stab wounds and secured and with adequate hemostasis the sternum was reapproximated with surgical tapes secondary to her nickel allergy.  Wound was then closed in multiple layers absorbable suture.  Sterile dressings were applied.

## 2023-05-11 NOTE — Telephone Encounter (Signed)
H&V Care Navigation CSW Progress Note  Clinical Social Worker able to speak with legal aid rep- patient is no longer active with them but they can take a community referral for the case given the urgency of the matter.  They will plan to reach out to patient Monday after she has recovered somewhat from surgery.     SDOH Screenings   Housing: Low Risk  (05/10/2023)  Transportation Needs: No Transportation Needs (01/25/2023)  Alcohol Screen: Low Risk  (12/13/2018)  Financial Resource Strain: Medium Risk (01/25/2023)  Social Connections: Unknown (05/17/2022)   Received from [facility], [facility]  Tobacco Use: High Risk (05/11/2023)   Burna Sis, LCSW Clinical Social Worker [facility] Desk#: [phone] Cell#: [phone]

## 2023-05-11 NOTE — Plan of Care (Signed)

## 2023-05-11 NOTE — Transfer of Care (Signed)
Immediate Anesthesia Transfer of Care Note  Patient: Rose [name]  Procedure(s) Performed: MITRAL VALVE (MV) REPLACEMENT USING MOSAIC VALVE SIZE (Chest) TRANSESOPHAGEAL ECHOCARDIOGRAM (Chest)  Patient Location: PACU  Anesthesia Type:General  Level of Consciousness: Patient remains intubated per anesthesia plan  Airway & Oxygen Therapy: Patient remains intubated per anesthesia plan  Post-op Assessment: Report given to RN and Post -op Vital signs reviewed and stable  Post vital signs: Reviewed and stable  Last Vitals:  Vitals Value Taken Time  BP 115/43   Temp 98   Pulse 80   Resp 16   SpO2 98     Last Pain:  Vitals:   05/11/23 0607  PainSc: 8          Complications: No notable events documented.

## 2023-05-11 NOTE — Consult Note (Signed)
NAME:  Rose [name], MRN:  [id number], DOB:  [birth year], LOS: 0 ADMISSION DATE:  05/11/2023, CONSULTATION DATE:  05/11/23 REFERRING MD:  Leafy Ro - cvts, CHIEF COMPLAINT:  s/p MVR    History of Present Illness:  67 yo F PMH rheumatic mitral stenosis, mitral regurgitation, severe tricuspid regurgitation, CAD, COPD who presented 05/11/23 for planned MV replacement and TV repair  Case unremarkable EBL 500cc Received 250 cell saver Total pump 89 min Xclamp 68 min   Admitted to ICU post op, intubated, as per pre-op plan   Pertinent  Medical History  Rheumatic mitral stenosis/ MVR, Severe TVR, CAD, COPD   Significant Hospital Events: Including procedures, antibiotic start and stop dates in addition to other pertinent events   12/13 Mitral valve replacement   Interim History / Subjective:  POD 0   Arrives to unit on 6 NE   Objective   Blood pressure 127/76, pulse 80, temperature 98.2 F (36.8 C), resp. rate 20, height 5\' 2"  (1.575 m), weight 83 kg, SpO2 98%.    Vent Mode: PRVC;PSV;SIMV FiO2 (%):  [50 %] 50 % Set Rate:  [16 bmp] 16 bmp Vt Set:  [400 mL] 400 mL PEEP:  [5 cmH20] 5 cmH20 Pressure Support:  [10 cmH20] 10 cmH20   Intake/Output Summary (Last 24 hours) at 05/11/2023 1243 Last data filed at 05/11/2023 1159 Gross per 24 hour  Intake 3500 ml  Output 600 ml  Net 2900 ml   Filed Weights   05/11/23 0548  Weight: 83 kg    Examination: General: Chronically and critically ill older adult F intubated sedated NAD  HENT: ETT secure anicteric sclera  Lungs: Low pitched wheeze. Symmetrical chest expansion. Mechanically ventilated  Cardiovascular: chest tube in place. Pacing wires. 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 -post op, lines/tubes/drains/wires per CVTS -wean NE as able, follow cardiac indices  -CXR, ABG in ICU -hopefully rapid vent wean -- based on gas, incr RR to 24 and gave an amp of  bicarb  -PRN duoneb  -post op labs -complete ppx abx -complete txa -Amio -ASA, statin, metop when appropriate   -insulin gtt  -multimodal analgesia  -smoking cessation counseling when appropriate  -needs outpt ENT follow up re parotid biopsy   Best Practice (right click and "Reselect all SmartList Selections" daily)   Diet/type: NPO DVT prophylaxis not indicated Pressure ulcer(s): N/A GI prophylaxis: PPI Lines: Central line, Arterial Line, and yes and it is still needed Foley:  Yes, and it is still needed Code Status:  full code Last date of multidisciplinary goals of care discussion [--]  Labs   CBC: Recent Labs  Lab 05/09/23 1330 05/11/23 0810 05/11/23 0909 05/11/23 1001 05/11/23 1020 05/11/23 1040 05/11/23 1044  WBC 5.1  --   --   --   --   --   --   HGB 12.2   < > 7.5* 7.5* 7.8* 7.8* 7.5*  HCT 38.4   < > 22.0* 22.0* 23.5* 23.0* 22.0*  MCV 86.1  --   --   --   --   --   --   PLT 191  --   --   --  146*  --   --    < > = values in this interval not displayed.    Basic Metabolic Panel: Recent Labs  Lab 05/09/23 1330 05/11/23 0810 05/11/23 0814 05/11/23 0847 05/11/23 0905 05/11/23 0909 05/11/23 1001 05/11/23 1040 05/11/23 1044  NA 141   < > 136 134* 142 140 136 138 137  K 4.3   < > 4.2 3.9 3.9 4.0 4.6 4.3 4.4  CL 102  --  102 103  --  99 102  --  103  CO2 26  --   --   --   --   --   --   --   --   GLUCOSE 112*  --  158* 165*  --  139* 134*  --  121*  BUN 14  --  19 17  --  15 18  --  18  CREATININE 1.17*  --  1.00 0.90  --  0.80 0.90  --  0.90  CALCIUM 9.5  --   --   --   --   --   --   --   --    < > = values in this interval not displayed.   GFR: Estimated Creatinine Clearance: 61.4 mL/min (by C-G  formula based on SCr of 0.9 mg/dL). Recent Labs  Lab 05/09/23 1330  WBC 5.1    Liver Function Tests: Recent Labs  Lab 05/09/23 1330  AST 20  ALT 19  ALKPHOS 104  BILITOT 0.6  PROT 7.7  ALBUMIN 4.1   No results for input(s): "LIPASE", "AMYLASE" in the last 168 hours. No results for input(s): "AMMONIA" in the last 168 hours.  ABG    Component Value Date/Time   PHART 7.239 (L) 05/11/2023 1040   PCO2ART 59.1 (H) 05/11/2023 1040   PO2ART 203 (H) 05/11/2023 1040   HCO3 25.2 05/11/2023 1040   TCO2 26 05/11/2023 1044   ACIDBASEDEF 2.0 05/11/2023 1040   O2SAT 100 05/11/2023 1040     Coagulation Profile: Recent Labs  Lab 05/09/23 1330  INR 1.0    Cardiac Enzymes: No results for input(s): "CKTOTAL", "CKMB", "CKMBINDEX", "TROPONINI" in the last 168 hours.  HbA1C: Hgb A1c MFr Bld  Date/Time Value Ref Range Status  05/09/2023 01:30 PM 5.8 (H) 4.8 - 5.6 % Final    Comment:    (NOTE) Pre diabetes:          5.7%-6.4%  Diabetes:              >6.4%  Glycemic control for   <7.0% adults with diabetes   12/13/2018 06:47 AM 5.4 4.8 - 5.6 % Final    Comment:    (NOTE) Pre diabetes:          5.7%-6.4% Diabetes:              >6.4% Glycemic control for   <7.0% adults with diabetes     CBG: Recent Labs  Lab 05/09/23 1254 05/11/23 0546 05/11/23 1211  GLUCAP 121* 127* 137*    Review of Systems:   Unable to obtain, intubated sedated   Past Medical History:  She,  has a past medical history of Allergy, Anemia, Anxiety, Aortic atherosclerosis (HCC), Arthritis, Asthma, Back pain, CAD (coronary artery disease), Chronic headaches, Class 1 obesity due to excess calories with serious comorbidity in adult, Complication of anesthesia, COPD (chronic obstructive pulmonary disease) (HCC), Diabetes (HCC), Fatty liver, GERD (gastroesophageal reflux disease), Headache, Herniated lumbar intervertebral disc, High cholesterol, History of blood clots,  Lumbago with sciatica, unspecified  side, Mitral stenosis, Moderate episode of recurrent major depressive disorder (HCC), Nicotine dependence, cigarettes, with other nicotine-induced disorders, Nonrheumatic aortic (valve) insufficiency, Other chronic pain, Pain in joint involving ankle and foot, Pancreatitis, Pneumonia, Presence urogenital implant, Severe mitral regurgitation, Sleep apnea, Stroke (HCC), SUI (stress urinary incontinence, female), Tricuspid regurgitation, and Type 2 diabetes mellitus with hyperglycemia, with long-term current use of insulin (HCC).   Surgical History:   Past Surgical History:  Procedure Laterality Date   ANKLE SURGERY     x 3   Bladder tack     BREAST REDUCTION SURGERY     CHOLECYSTECTOMY  1998   HERNIA REPAIR     I & D EXTREMITY Right 10/28/2021   Procedure: IRRIGATION AND DEBRIDEMENT EXTREMITY, GREAT TOE;  Surgeon: Netta Cedars, MD;  Location: [facility] OR;  Service: Orthopedics;  Laterality: Right;   RIGHT/LEFT HEART CATH AND CORONARY ANGIOGRAPHY N/A 11/02/2022   Procedure: RIGHT/LEFT HEART CATH AND CORONARY ANGIOGRAPHY;  Surgeon: Tonny Bollman, MD;  Location: [facility] INVASIVE CV LAB;  Service: Cardiovascular;  Laterality: N/A;   TEE WITHOUT CARDIOVERSION N/A 11/02/2022   Procedure: TRANSESOPHAGEAL ECHOCARDIOGRAM;  Surgeon: Jake Bathe, MD;  Location: [facility] INVASIVE CV LAB;  Service: Cardiovascular;  Laterality: N/A;   VAGINAL HYSTERECTOMY  1988     Social History:   reports that she has been smoking cigarettes. She has a 23.5 pack-year smoking history. She has never used smokeless tobacco. She reports that she does not currently use drugs after having used the following drugs: Marijuana. She reports that she does not drink alcohol.   Family History:  Her family history includes Cancer in her mother and sister; Emphysema in her mother; Heart disease in her father and mother.   Allergies Allergies  Allergen Reactions   Erythromycin Shortness Of Breath   Nickel Rash and Shortness Of Breath    Oxycodone Shortness Of Breath   Meperidine Other (See Comments)    Confusion and tingling  (noted in H&P)    Codeine Swelling   Hydrocodone-Acetaminophen Itching    Tolerated Norco 11/2018   Morphine Itching   Propoxyphene Other (See Comments)    Ineffective    Quetiapine     Restless leg syndrome     Home Medications  Prior to Admission medications   Medication Sig Start Date End Date Taking? Authorizing Provider  aspirin EC 81 MG tablet Take 1 tablet (81 mg total) by mouth daily. Swallow whole. 05/06/21  Yes Tolia, Sunit, DO  Fluticasone-Umeclidin-Vilant (TRELEGY ELLIPTA) 200-62.5-25 MCG/ACT AEPB Inhale 200 mcg into the lungs daily.   Yes [provider]  gabapentin (NEURONTIN) 800 MG tablet Take 800 mg by mouth 3 (three) times daily.   Yes [provider]  meloxicam (MOBIC) 7.5 MG tablet Take 7.5 mg by mouth every morning. 02/15/23  Yes [provider]  metFORMIN (GLUCOPHAGE) 500 MG tablet Take 500 mg by mouth 2 (two) times daily. 03/05/19  Yes [provider]  omeprazole (PRILOSEC) 40 MG capsule Take 40 mg by mouth daily. 01/23/22  Yes [provider]  ondansetron (ZOFRAN) 4 MG tablet Take 4 mg by mouth every 8 (eight) hours as needed for nausea or vomiting.   Yes [provider]  Pancrelipase, Lip-Prot-Amyl, (CREON) 24000-76000 units CPEP Take 1 capsule by mouth 3 (three) times daily after meals.   Yes [provider]  rOPINIRole (REQUIP) 1 MG tablet Take 1 mg by mouth at bedtime.   Yes [provider]  rosuvastatin (CRESTOR)  40 MG tablet Take 1 tablet (40 mg total) by mouth at bedtime. 10/27/22 05/11/23 Yes Jake Bathe, MD  sertraline (ZOLOFT) 50 MG tablet Take 50 mg by mouth every morning. 01/11/23  Yes [provider]  traZODone (DESYREL) 50 MG tablet Take 150 mg by mouth at bedtime.   Yes [provider]  clonazePAM (KLONOPIN) 1 MG tablet Take 1 mg by mouth 3 (three) times daily.    [provider]  methocarbamol (ROBAXIN) 500 MG tablet Take 1 tablet (500 mg total) by mouth every 8 (eight) hours as needed for muscle spasms. 03/14/22   Benjiman Core, MD     Critical care time: 48 min      CRITICAL CARE Performed by: Lanier Clam   Total critical care time: 48 minutes  Critical care time was exclusive of separately billable procedures and treating other patients.  Critical care was necessary to treat or prevent imminent or life-threatening deterioration.  Critical care was time spent personally by me on the following activities: development of treatment plan with patient and/or surrogate as well as nursing, discussions with consultants, evaluation of patient's response to treatment, examination of patient, obtaining history from patient or surrogate, ordering and performing treatments and interventions, ordering and review of laboratory studies, ordering and review of radiographic studies, pulse oximetry and re-evaluation of patient's condition.  Tessie Fass MSN, AGACNP-BC [facility] Pulmonary/Critical Care Medicine Amion for pager  05/11/2023, 12:43 PM

## 2023-05-11 NOTE — Discharge Instructions (Signed)

## 2023-05-12 ENCOUNTER — Inpatient Hospital Stay (HOSPITAL_COMMUNITY): Payer: 59

## 2023-05-12 DIAGNOSIS — I959 Hypotension, unspecified: Secondary | ICD-10-CM | POA: Diagnosis not present

## 2023-05-12 DIAGNOSIS — Z952 Presence of prosthetic heart valve: Secondary | ICD-10-CM | POA: Diagnosis not present

## 2023-05-12 LAB — CBC
HCT: 26.6 % — ABNORMAL LOW (ref 36.0–46.0)
HCT: 25.7 % — ABNORMAL LOW (ref 36.0–46.0)
Hemoglobin: 8.6 g/dL — ABNORMAL LOW (ref 12.0–15.0)
Hemoglobin: 8.3 g/dL — ABNORMAL LOW (ref 12.0–15.0)
MCH: 27.7 pg (ref 26.0–34.0)
MCH: 27.6 pg (ref 26.0–34.0)
MCHC: 32.3 g/dL (ref 30.0–36.0)
MCHC: 32.3 g/dL (ref 30.0–36.0)
MCV: 85.5 fL (ref 80.0–100.0)
MCV: 85.4 fL (ref 80.0–100.0)
Platelets: 137 10*3/uL — ABNORMAL LOW (ref 150–400)
Platelets: 116 10*3/uL — ABNORMAL LOW (ref 150–400)
RBC: 3.11 MIL/uL — ABNORMAL LOW (ref 3.87–5.11)
RBC: 3.01 MIL/uL — ABNORMAL LOW (ref 3.87–5.11)
RDW: 15.9 % — ABNORMAL HIGH (ref 11.5–15.5)
RDW: 16 % — ABNORMAL HIGH (ref 11.5–15.5)
WBC: 9.1 10*3/uL (ref 4.0–10.5)
WBC: 8.8 10*3/uL (ref 4.0–10.5)
nRBC: 0 % (ref 0.0–0.2)
nRBC: 0 % (ref 0.0–0.2)

## 2023-05-12 LAB — BASIC METABOLIC PANEL
Anion gap: 7 (ref 5–15)
Anion gap: 13 (ref 5–15)
BUN: 13 mg/dL (ref 8–23)
BUN: 15 mg/dL (ref 8–23)
CO2: 21 mmol/L — ABNORMAL LOW (ref 22–32)
CO2: 23 mmol/L (ref 22–32)
Calcium: 7.2 mg/dL — ABNORMAL LOW (ref 8.9–10.3)
Calcium: 7.2 mg/dL — ABNORMAL LOW (ref 8.9–10.3)
Chloride: 105 mmol/L (ref 98–111)
Chloride: 96 mmol/L — ABNORMAL LOW (ref 98–111)
Creatinine, Ser: 1.03 mg/dL — ABNORMAL HIGH (ref 0.44–1.00)
Creatinine, Ser: 1.4 mg/dL — ABNORMAL HIGH (ref 0.44–1.00)
GFR, Estimated: 60 mL/min — ABNORMAL LOW (ref >60)
GFR, Estimated: 41 mL/min — ABNORMAL LOW (ref >60)
Glucose, Bld: 135 mg/dL — ABNORMAL HIGH (ref 70–99)
Glucose, Bld: 262 mg/dL — ABNORMAL HIGH (ref 70–99)
Potassium: 4.4 mmol/L (ref 3.5–5.1)
Potassium: 3.5 mmol/L (ref 3.5–5.1)
Sodium: 133 mmol/L — ABNORMAL LOW (ref 135–145)
Sodium: 132 mmol/L — ABNORMAL LOW (ref 135–145)

## 2023-05-12 LAB — GLUCOSE, CAPILLARY
Glucose-Capillary: 108 mg/dL — ABNORMAL HIGH (ref 70–99)
Glucose-Capillary: 108 mg/dL — ABNORMAL HIGH (ref 70–99)
Glucose-Capillary: 117 mg/dL — ABNORMAL HIGH (ref 70–99)
Glucose-Capillary: 119 mg/dL — ABNORMAL HIGH (ref 70–99)
Glucose-Capillary: 125 mg/dL — ABNORMAL HIGH (ref 70–99)
Glucose-Capillary: 141 mg/dL — ABNORMAL HIGH (ref 70–99)
Glucose-Capillary: 143 mg/dL — ABNORMAL HIGH (ref 70–99)
Glucose-Capillary: 247 mg/dL — ABNORMAL HIGH (ref 70–99)

## 2023-05-12 LAB — MAGNESIUM
Magnesium: 3.2 mg/dL — ABNORMAL HIGH (ref 1.7–2.4)
Magnesium: 2.6 mg/dL — ABNORMAL HIGH (ref 1.7–2.4)

## 2023-05-12 MED ORDER — POTASSIUM CHLORIDE CRYS ER 20 MEQ PO TBCR
20.0000 meq | EXTENDED_RELEASE_TABLET | Freq: Two times a day (BID) | ORAL | Status: AC
  Administered 2023-05-12 (×2): 20 meq via ORAL
  Filled 2023-05-12 (×2): qty 1

## 2023-05-12 MED ORDER — NICOTINE 21 MG/24HR TD PT24
21.0000 mg | MEDICATED_PATCH | Freq: Every day | TRANSDERMAL | Status: DC
Start: 2023-05-12 — End: 2023-05-22
  Administered 2023-05-12 – 2023-05-22 (×10): 21 mg via TRANSDERMAL
  Filled 2023-05-12 (×11): qty 1

## 2023-05-12 MED ORDER — POTASSIUM CHLORIDE CRYS ER 20 MEQ PO TBCR: 20.0000 meq | EXTENDED_RELEASE_TABLET | ORAL | Status: DC

## 2023-05-12 MED ORDER — ENOXAPARIN SODIUM 40 MG/0.4ML IJ SOSY
40.0000 mg | PREFILLED_SYRINGE | Freq: Every day | INTRAMUSCULAR | Status: DC
  Administered 2023-05-12 – 2023-05-16 (×5): 40 mg via SUBCUTANEOUS
  Filled 2023-05-12 (×5): qty 0.4

## 2023-05-12 MED ORDER — MIDODRINE HCL 5 MG PO TABS
10.0000 mg | ORAL_TABLET | Freq: Three times a day (TID) | ORAL | Status: DC
  Administered 2023-05-12 – 2023-05-22 (×31): 10 mg via ORAL
  Filled 2023-05-12 (×30): qty 2

## 2023-05-12 MED ORDER — INSULIN DETEMIR 100 UNIT/ML ~~LOC~~ SOLN
20.0000 [IU] | Freq: Once | SUBCUTANEOUS | Status: AC
  Administered 2023-05-12: 20 [IU] via SUBCUTANEOUS
  Filled 2023-05-12: qty 0.2

## 2023-05-12 MED ORDER — HYDROMORPHONE HCL 1 MG/ML IJ SOLN
0.5000 mg | INTRAMUSCULAR | Status: DC | PRN
  Administered 2023-05-12 – 2023-05-13 (×2): 1 mg via INTRAVENOUS
  Filled 2023-05-12 (×2): qty 1

## 2023-05-12 MED ORDER — FUROSEMIDE 10 MG/ML IJ SOLN
40.0000 mg | Freq: Two times a day (BID) | INTRAMUSCULAR | Status: AC
  Administered 2023-05-12 (×2): 40 mg via INTRAVENOUS
  Filled 2023-05-12 (×2): qty 4

## 2023-05-12 MED ORDER — INSULIN ASPART 100 UNIT/ML IJ SOLN
0.0000 [IU] | INTRAMUSCULAR | Status: DC
  Administered 2023-05-12: 2 [IU] via SUBCUTANEOUS
  Administered 2023-05-12: 8 [IU] via SUBCUTANEOUS
  Administered 2023-05-13 (×2): 2 [IU] via SUBCUTANEOUS

## 2023-05-12 MED ORDER — ORAL CARE MOUTH RINSE
15.0000 mL | OROMUCOSAL | Status: DC | PRN
  Administered 2023-05-21: 15 mL via OROMUCOSAL

## 2023-05-12 NOTE — Progress Notes (Signed)
   NAME:  Rose [name], MRN:  [id number], DOB:  12/05/1955, LOS: 1 ADMISSION DATE:  05/11/2023, CONSULTATION DATE:  05/11/23 REFERRING MD:  Leafy Ro - cvts, CHIEF COMPLAINT:  s/p MVR    History of Present Illness:  67 yo F PMH rheumatic mitral stenosis, mitral regurgitation, severe tricuspid regurgitation, CAD, COPD who presented 05/11/23 for planned MV replacement and TV repair  Case unremarkable EBL 500cc Received 250 cell saver Total pump 89 min Xclamp 68 min   Admitted to ICU post op, intubated, as per pre-op plan   Pertinent  Medical History  Rheumatic mitral stenosis/ MVR, Severe TVR, CAD, COPD   Significant Hospital Events: Including procedures, antibiotic start and stop dates in addition to other pertinent events   12/13 Mitral valve replacement   Interim History / Subjective:  Pain not under great control. IS around 500 Still on a bit of levophed  Objective   Blood pressure 132/70, pulse 80, temperature (!) 100.6 F (38.1 C), resp. rate 20, height 5\' 2"  (1.575 m), weight 90.6 kg, SpO2 96%. PAP: (33-96)/(7-45) 52/25 CO:  [3.9 L/min-5.5 L/min] 4.4 L/min CI:  [2.1 L/min/m2-3 L/min/m2] 2.4 L/min/m2  Vent Mode: CPAP;PSV FiO2 (%):  [40 %-50 %] 40 % Set Rate:  [4 bmp-28 bmp] 4 bmp Vt Set:  [400 mL] 400 mL PEEP:  [5 cmH20] 5 cmH20 Pressure Support:  [10 cmH20] 10 cmH20   Intake/Output Summary (Last 24 hours) at 05/12/2023 1104 Last data filed at 05/12/2023 0800 Gross per 24 hour  Intake 3559.77 ml  Output 2510 ml  Net 1049.77 ml   Filed Weights   05/11/23 0548 05/12/23 0600  Weight: 83 kg 90.6 kg    Examination: Sternotomy dressed, looks okay R neck fullness Drains still with fair amount of serosanguinous output Poor inspiratory effort No edema Abd soft Aox3   CBC ok BMP mild hyponatremia, Cr okay CXR with L atelectasis  Resolved Hospital Problem list     Assessment & Plan:   Rheumatic mitral stenosis, mitral regurg -- s/p MVR Postop  vasoplegia, atelectasis Anemia, expected  Tobacco use  Baseline anxiety/pain  COPD mild/moderate by PFTs although DLCO is pretty low 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  - Multimodal pain strategy: changes fent to dilaudid - Periop abx as ordered - Levophed for MAP 65 - Drain and line management per TCTS - Eventual GDMT - Braced coughing, IS - Needs to stop smoking - Reordered home anxiety and pain meds - Triple nebs therapy - ?eventual need for [facility]  Best Practice (right click and "Reselect all SmartList Selections" daily)   Diet/type: Regular DVT prophylaxis not indicated Pressure ulcer(s): N/A GI prophylaxis: PPI Lines: Central line, Arterial Line, and yes and it is still needed Foley:  Yes, and it is still needed Code Status:  full code Last date of multidisciplinary goals of care discussion [--]  My cc time 33 mins Myrla Halsted MD PCCM

## 2023-05-12 NOTE — Progress Notes (Signed)
Patient unable to get comfortable in bed, despite several attempts of repositioning, and administering pain and anti-anxiety meds.  Patient insisted on getting up because the bed was "too uncomfortable".  After several hours of interventions, nurse assisted patient to recliner successfully.

## 2023-05-12 NOTE — Progress Notes (Signed)
1 Day Post-Op Procedure(s) (LRB): MITRAL VALVE (MV) REPLACEMENT USING MOSAIC VALVE SIZE (N/A) TRANSESOPHAGEAL ECHOCARDIOGRAM (N/A) Subjective:  Complains of chest wall pain.   Objective: Vital signs in last 24 hours: Temp:  [95.9 F (35.5 C)-101.1 F (38.4 C)] 100.6 F (38.1 C) (12/14 0900) Pulse Rate:  [73-86] 80 (12/14 0900) Resp:  [10-28] 20 (12/14 0900) BP: (77-132)/(46-110) 132/70 (12/14 0900) SpO2:  [89 %-100 %] 96 % (12/14 0900) Arterial Line BP: (82-153)/(39-64) 139/64 (12/14 0900) FiO2 (%):  [40 %-50 %] 40 % (12/13 1635) Weight:  [90.6 kg] 90.6 kg (12/14 0600)  Hemodynamic parameters for last 24 hours: PAP: (33-96)/(7-45) 52/25 CO:  [3.9 L/min-5.5 L/min] 4.4 L/min CI:  [2.1 L/min/m2-3 L/min/m2] 2.4 L/min/m2  Intake/Output from previous day: 12/13 0701 - 12/14 0700 In: 5567.7 [I.V.:4061; Blood:250; IV Piggyback:1256.7] Out: 3110 [Urine:2340; Chest Tube:770] Intake/Output this shift: Total I/O In: 281.3 [I.V.:181.3; IV Piggyback:100] Out: -   General appearance: alert and cooperative Neurologic: intact Heart: regular rate and rhythm Lungs: diminished breath sounds bibasilar Extremities: edema moderate Wound: dressing dry  Lab Results: Recent Labs    05/11/23 1749 05/11/23 1822 05/11/23 1907 05/12/23 0310  WBC 8.2  --   --  9.1  HGB 8.1*   < > 7.5* 8.6*  HCT 24.6*   < > 22.0* 26.6*  PLT 145*  --   --  137*   < > = values in this interval not displayed.   BMET:  Recent Labs    05/11/23 1749 05/11/23 1822 05/11/23 1907 05/12/23 0310  NA 138   < > 139 133*  K 4.3   < > 4.3 4.4  CL 106  --   --  105  CO2 24  --   --  21*  GLUCOSE 124*  --   --  135*  BUN 15  --   --  13  CREATININE 1.02*  --   --  1.03*  CALCIUM 7.3*  --   --  7.2*   < > = values in this interval not displayed.    PT/INR:  Recent Labs    05/11/23 1208  LABPROT 17.3*  INR 1.4*   ABG    Component Value Date/Time   PHART 7.321 (L) 05/11/2023 1907   HCO3 24.8  05/11/2023 1907   TCO2 26 05/11/2023 1907   ACIDBASEDEF 1.0 05/11/2023 1907   O2SAT 93 05/11/2023 1907   CBG (last 3)  Recent Labs    05/12/23 0302 05/12/23 0530 05/12/23 0725  GLUCAP 141* 119* 125*   CXR: bibasilar atelectasis  ECG: sinus, no acute changes.  Assessment/Plan: S/P Procedure(s) (LRB): MITRAL VALVE (MV) REPLACEMENT USING MOSAIC VALVE SIZE (N/A) TRANSESOPHAGEAL ECHOCARDIOGRAM (N/A)  POD 1 Hemodynamically stable on low dose NE. Wean as tolerated. She is on multiple meds at home that will tend to lower BP. Will start her on midodrine for a few days. Hold off on beta blocker for now.  Start diuresis. Wt is 17 lbs over preop.  Hx of heavy smoking. She wants nicotine patch. Continue IS, OOB. CCM started pulmonary meds.  Chest tubes still draining some so will leave in for now.  DC swan, arterial line.  Plan to start Coumadin tomorrow after pacing wire removal.   LOS: 1 day    Alleen Borne 05/12/2023

## 2023-05-12 NOTE — Progress Notes (Signed)
Patient ID: Rose [name], female   DOB: 1956-04-22, 67 y.o.   MRN: [id number]  TCTS Evening Rounds:  Hemodynamically stable in sinus rhythm. Still on NE at 7 mcg for BP support. Sitting up in chair and walked around room today.  UO good.  CT output low. Will remove tomorrow.

## 2023-05-12 NOTE — Plan of Care (Signed)
  Problem: Education: Goal: Knowledge of General Education information will improve Description: Including pain rating scale, medication(s)/side effects and non-pharmacologic comfort measures Outcome: Progressing   Problem: Health Behavior/Discharge Planning: Goal: Ability to manage health-related needs will improve Outcome: Progressing   Problem: Clinical Measurements: Goal: Ability to maintain clinical measurements within normal limits will improve Outcome: Progressing Goal: Will remain free from infection Outcome: Progressing Goal: Diagnostic test results will improve Outcome: Progressing Goal: Respiratory complications will improve Outcome: Progressing Goal: Cardiovascular complication will be avoided Outcome: Progressing   Problem: Activity: Goal: Risk for activity intolerance will decrease Outcome: Progressing   Problem: Nutrition: Goal: Adequate nutrition will be maintained Outcome: Progressing   Problem: Coping: Goal: Level of anxiety will decrease Outcome: Progressing   Problem: Elimination: Goal: Will not experience complications related to bowel motility Outcome: Progressing Goal: Will not experience complications related to urinary retention Outcome: Progressing   Problem: Pain Management: Goal: General experience of comfort will improve Outcome: Progressing   Problem: Safety: Goal: Ability to remain free from injury will improve Outcome: Progressing   Problem: Skin Integrity: Goal: Risk for impaired skin integrity will decrease Outcome: Progressing   Problem: Education: Goal: Will demonstrate proper wound care and an understanding of methods to prevent future damage Outcome: Progressing Goal: Knowledge of disease or condition will improve Outcome: Progressing Goal: Knowledge of the prescribed therapeutic regimen will improve Outcome: Progressing Goal: Individualized Educational Video(s) Outcome: Progressing   Problem: Activity: Goal: Risk for  activity intolerance will decrease Outcome: Progressing   Problem: Cardiac: Goal: Will achieve and/or maintain hemodynamic stability Outcome: Progressing   Problem: Clinical Measurements: Goal: Postoperative complications will be avoided or minimized Outcome: Progressing   Problem: Respiratory: Goal: Respiratory status will improve Outcome: Progressing   Problem: Skin Integrity: Goal: Wound healing without signs and symptoms of infection Outcome: Progressing Goal: Risk for impaired skin integrity will decrease Outcome: Progressing   Problem: Urinary Elimination: Goal: Ability to achieve and maintain adequate renal perfusion and functioning will improve Outcome: Progressing

## 2023-05-13 ENCOUNTER — Inpatient Hospital Stay (HOSPITAL_COMMUNITY): Payer: 59

## 2023-05-13 LAB — CBC
HCT: 24.5 % — ABNORMAL LOW (ref 36.0–46.0)
Hemoglobin: 7.9 g/dL — ABNORMAL LOW (ref 12.0–15.0)
MCH: 27.8 pg (ref 26.0–34.0)
MCHC: 32.2 g/dL (ref 30.0–36.0)
MCV: 86.3 fL (ref 80.0–100.0)
Platelets: 113 10*3/uL — ABNORMAL LOW (ref 150–400)
RBC: 2.84 MIL/uL — ABNORMAL LOW (ref 3.87–5.11)
RDW: 15.9 % — ABNORMAL HIGH (ref 11.5–15.5)
WBC: 8.7 10*3/uL (ref 4.0–10.5)
nRBC: 0 % (ref 0.0–0.2)

## 2023-05-13 LAB — GLUCOSE, CAPILLARY
Glucose-Capillary: 123 mg/dL — ABNORMAL HIGH (ref 70–99)
Glucose-Capillary: 125 mg/dL — ABNORMAL HIGH (ref 70–99)
Glucose-Capillary: 139 mg/dL — ABNORMAL HIGH (ref 70–99)
Glucose-Capillary: 139 mg/dL — ABNORMAL HIGH (ref 70–99)
Glucose-Capillary: 107 mg/dL — ABNORMAL HIGH (ref 70–99)

## 2023-05-13 LAB — BASIC METABOLIC PANEL
Anion gap: 9 (ref 5–15)
BUN: 14 mg/dL (ref 8–23)
CO2: 26 mmol/L (ref 22–32)
Calcium: 7.3 mg/dL — ABNORMAL LOW (ref 8.9–10.3)
Chloride: 99 mmol/L (ref 98–111)
Creatinine, Ser: 1.38 mg/dL — ABNORMAL HIGH (ref 0.44–1.00)
GFR, Estimated: 42 mL/min — ABNORMAL LOW (ref >60)
Glucose, Bld: 120 mg/dL — ABNORMAL HIGH (ref 70–99)
Potassium: 3.7 mmol/L (ref 3.5–5.1)
Sodium: 134 mmol/L — ABNORMAL LOW (ref 135–145)

## 2023-05-13 LAB — MAGNESIUM: Magnesium: 2.3 mg/dL (ref 1.7–2.4)

## 2023-05-13 MED ORDER — POTASSIUM CHLORIDE CRYS ER 20 MEQ PO TBCR
20.0000 meq | EXTENDED_RELEASE_TABLET | ORAL | Status: AC
  Administered 2023-05-13 (×3): 20 meq via ORAL
  Filled 2023-05-13 (×3): qty 1

## 2023-05-13 MED ORDER — WARFARIN - PHYSICIAN DOSING INPATIENT: Freq: Every day | Status: DC

## 2023-05-13 MED ORDER — INSULIN ASPART 100 UNIT/ML IJ SOLN
0.0000 [IU] | Freq: Three times a day (TID) | INTRAMUSCULAR | Status: DC
  Administered 2023-05-13 – 2023-05-14 (×5): 2 [IU] via SUBCUTANEOUS
  Administered 2023-05-15: 4 [IU] via SUBCUTANEOUS
  Administered 2023-05-15: 2 [IU] via SUBCUTANEOUS
  Administered 2023-05-16: 4 [IU] via SUBCUTANEOUS

## 2023-05-13 MED ORDER — FE FUM-VIT C-VIT B12-FA 460-60-0.01-1 MG PO CAPS
1.0000 | ORAL_CAPSULE | Freq: Every day | ORAL | Status: DC
  Administered 2023-05-13 – 2023-05-22 (×10): 1 via ORAL
  Filled 2023-05-13 (×10): qty 1

## 2023-05-13 MED ORDER — DIPHENHYDRAMINE HCL 25 MG PO CAPS
25.0000 mg | ORAL_CAPSULE | Freq: Four times a day (QID) | ORAL | Status: DC | PRN
  Administered 2023-05-13 – 2023-05-21 (×8): 25 mg via ORAL
  Filled 2023-05-13 (×8): qty 1

## 2023-05-13 MED ORDER — WARFARIN SODIUM 2.5 MG PO TABS
2.5000 mg | ORAL_TABLET | Freq: Once | ORAL | Status: AC
  Administered 2023-05-13: 2.5 mg via ORAL
  Filled 2023-05-13: qty 1

## 2023-05-13 MED ORDER — LACTULOSE 10 GM/15ML PO SOLN
20.0000 g | Freq: Once | ORAL | Status: AC
  Administered 2023-05-13: 20 g via ORAL
  Filled 2023-05-13: qty 30

## 2023-05-13 MED ORDER — HYDROMORPHONE HCL 2 MG PO TABS
2.0000 mg | ORAL_TABLET | ORAL | Status: DC | PRN
  Administered 2023-05-13 (×2): 2 mg via ORAL
  Filled 2023-05-13 (×2): qty 1

## 2023-05-13 NOTE — Progress Notes (Signed)
Patient ID: Rose [name], female   DOB: 04-29-1956, 67 y.o.   MRN: [id number] TCTS Evening Rounds:  Hemodynamically stable off NE. Arterial line out.  Bowels working.  Ambulated today.  Starting Coumadin tonight.

## 2023-05-13 NOTE — Plan of Care (Signed)

## 2023-05-13 NOTE — Progress Notes (Signed)
2 Days Post-Op Procedure(s) (LRB): MITRAL VALVE (MV) REPLACEMENT USING MOSAIC VALVE SIZE (N/A) TRANSESOPHAGEAL ECHOCARDIOGRAM (N/A) Subjective: Passing a lot of gas but no BM yet.   Still on NE 8 and midodrine 10 tid for BP support due to postop vasoplegia.  Has not ambulated yet.  Objective: Vital signs in last 24 hours: Temp:  [98.1 F (36.7 C)-100.2 F (37.9 C)] 98.6 F (37 C) (12/15 0800) Pulse Rate:  [65-86] 85 (12/15 0630) Resp:  [11-25] 17 (12/15 0630) BP: (103-131)/(51-77) 121/66 (12/15 0615) SpO2:  [86 %-99 %] 99 % (12/15 0759) Arterial Line BP: (92-140)/(39-66) 94/45 (12/15 0545) Weight:  [89.1 kg] 89.1 kg (12/15 0500)  Hemodynamic parameters for last 24 hours: PAP: (50-59)/(22-30) 54/22  Intake/Output from previous day: 12/14 0701 - 12/15 0700 In: 1566.5 [P.O.:460; I.V.:718.4; IV Piggyback:388.1] Out: 1610 [Urine:3795; Chest Tube:210] Intake/Output this shift: Total I/O In: 77.9 [I.V.:65.9; IV Piggyback:12] Out: 185 [Urine:165; Chest Tube:20]  General appearance: alert and cooperative Neurologic: intact Heart: regular rate and rhythm Lungs: clear to auscultation bilaterally Extremities: edema mild Wound: dressing dry  Lab Results: Recent Labs    05/12/23 1936 05/13/23 0317  WBC 8.8 8.7  HGB 8.3* 7.9*  HCT 25.7* 24.5*  PLT 116* 113*   BMET:  Recent Labs    05/12/23 1936 05/13/23 0317  NA 132* 134*  K 3.5 3.7  CL 96* 99  CO2 23 26  GLUCOSE 262* 120*  BUN 15 14  CREATININE 1.40* 1.38*  CALCIUM 7.2* 7.3*    PT/INR:  Recent Labs    05/11/23 1208  LABPROT 17.3*  INR 1.4*   ABG    Component Value Date/Time   PHART 7.321 (L) 05/11/2023 1907   HCO3 24.8 05/11/2023 1907   TCO2 26 05/11/2023 1907   ACIDBASEDEF 1.0 05/11/2023 1907   O2SAT 93 05/11/2023 1907   CBG (last 3)  Recent Labs    05/12/23 2316 05/13/23 0326 05/13/23 0739  GLUCAP 117* 123* 125*   CXR: low lung volumes. Bibasilar atelectasis  R>L.  Assessment/Plan: S/P Procedure(s) (LRB): MITRAL VALVE (MV) REPLACEMENT USING MOSAIC VALVE SIZE (N/A) TRANSESOPHAGEAL ECHOCARDIOGRAM (N/A)  POD 2  Hemodynamically stable in sinus rhythm but still requiring NE and midodrine to support BP, likely vasoplegia related to some of her preop meds including neurontin, Zoloft, clonazepam etc. Wean NE as tolerated.  -2400 cc yesterday. Wt is 13.5 lbs over preop. Holding further diuresis until she can wean off NE.  Glucose under adequate control. Continue SSI. Resume metformin when eating better.  Start Coumadin today.  DC pacing wires.  DC all chest tubes today.  IS, OOB, start ambulation   LOS: 2 days    Alleen Borne 05/13/2023

## 2023-05-13 NOTE — Progress Notes (Signed)
05/13/2023  Discussed with TCTS, progressing well still having a little vasoplegia.  No major changes to care, see if we can wean down NE.  Will see again formally tomorrow  Myrla Halsted MD PCCM

## 2023-05-14 ENCOUNTER — Inpatient Hospital Stay (HOSPITAL_COMMUNITY): Payer: 59

## 2023-05-14 DIAGNOSIS — Z952 Presence of prosthetic heart valve: Secondary | ICD-10-CM | POA: Diagnosis not present

## 2023-05-14 DIAGNOSIS — I051 Rheumatic mitral insufficiency: Secondary | ICD-10-CM

## 2023-05-14 DIAGNOSIS — I071 Rheumatic tricuspid insufficiency: Secondary | ICD-10-CM

## 2023-05-14 LAB — CBC
HCT: 21.7 % — ABNORMAL LOW (ref 36.0–46.0)
Hemoglobin: 7 g/dL — ABNORMAL LOW (ref 12.0–15.0)
MCH: 28.1 pg (ref 26.0–34.0)
MCHC: 32.3 g/dL (ref 30.0–36.0)
MCV: 87.1 fL (ref 80.0–100.0)
Platelets: 91 10*3/uL — ABNORMAL LOW (ref 150–400)
RBC: 2.49 MIL/uL — ABNORMAL LOW (ref 3.87–5.11)
RDW: 15.9 % — ABNORMAL HIGH (ref 11.5–15.5)
WBC: 5.3 10*3/uL (ref 4.0–10.5)
nRBC: 0 % (ref 0.0–0.2)

## 2023-05-14 LAB — PROTIME-INR
INR: 1.3 — ABNORMAL HIGH (ref 0.8–1.2)
Prothrombin Time: 16.6 s — ABNORMAL HIGH (ref 11.4–15.2)

## 2023-05-14 LAB — CBC WITH DIFFERENTIAL/PLATELET
Abs Immature Granulocytes: 0.03 10*3/uL (ref 0.00–0.07)
Basophils Absolute: 0 10*3/uL (ref 0.0–0.1)
Basophils Relative: 0 %
Eosinophils Absolute: 0.1 10*3/uL (ref 0.0–0.5)
Eosinophils Relative: 2 %
HCT: 24.5 % — ABNORMAL LOW (ref 36.0–46.0)
Hemoglobin: 8 g/dL — ABNORMAL LOW (ref 12.0–15.0)
Immature Granulocytes: 1 %
Lymphocytes Relative: 6 %
Lymphs Abs: 0.3 10*3/uL — ABNORMAL LOW (ref 0.7–4.0)
MCH: 28.5 pg (ref 26.0–34.0)
MCHC: 32.7 g/dL (ref 30.0–36.0)
MCV: 87.2 fL (ref 80.0–100.0)
Monocytes Absolute: 0.4 10*3/uL (ref 0.1–1.0)
Monocytes Relative: 7 %
Neutro Abs: 4.4 10*3/uL (ref 1.7–7.7)
Neutrophils Relative %: 84 %
Platelets: 93 10*3/uL — ABNORMAL LOW (ref 150–400)
RBC: 2.81 MIL/uL — ABNORMAL LOW (ref 3.87–5.11)
RDW: 15.4 % (ref 11.5–15.5)
WBC: 5.2 10*3/uL (ref 4.0–10.5)
nRBC: 0 % (ref 0.0–0.2)

## 2023-05-14 LAB — BASIC METABOLIC PANEL
Anion gap: 9 (ref 5–15)
BUN: 17 mg/dL (ref 8–23)
CO2: 24 mmol/L (ref 22–32)
Calcium: 7.4 mg/dL — ABNORMAL LOW (ref 8.9–10.3)
Chloride: 96 mmol/L — ABNORMAL LOW (ref 98–111)
Creatinine, Ser: 1.16 mg/dL — ABNORMAL HIGH (ref 0.44–1.00)
GFR, Estimated: 52 mL/min — ABNORMAL LOW (ref >60)
Glucose, Bld: 133 mg/dL — ABNORMAL HIGH (ref 70–99)
Potassium: 4.3 mmol/L (ref 3.5–5.1)
Sodium: 129 mmol/L — ABNORMAL LOW (ref 135–145)

## 2023-05-14 LAB — GLUCOSE, CAPILLARY
Glucose-Capillary: 138 mg/dL — ABNORMAL HIGH (ref 70–99)
Glucose-Capillary: 131 mg/dL — ABNORMAL HIGH (ref 70–99)
Glucose-Capillary: 132 mg/dL — ABNORMAL HIGH (ref 70–99)
Glucose-Capillary: 120 mg/dL — ABNORMAL HIGH (ref 70–99)

## 2023-05-14 LAB — SURGICAL PATHOLOGY

## 2023-05-14 LAB — PREPARE RBC (CROSSMATCH)

## 2023-05-14 MED ORDER — AMIODARONE LOAD VIA INFUSION: 150.0000 mg | Freq: Once | INTRAVENOUS | Status: DC

## 2023-05-14 MED ORDER — ACETAMINOPHEN 160 MG/5ML PO SOLN
500.0000 mg | Freq: Four times a day (QID) | ORAL | Status: AC
Start: 2023-05-14 — End: 2023-05-16

## 2023-05-14 MED ORDER — SODIUM CHLORIDE 0.9% IV SOLUTION: Freq: Once | INTRAVENOUS | Status: AC

## 2023-05-14 MED ORDER — WARFARIN SODIUM 2.5 MG PO TABS
2.5000 mg | ORAL_TABLET | Freq: Once | ORAL | Status: AC
  Administered 2023-05-14: 2.5 mg via ORAL
  Filled 2023-05-14: qty 1

## 2023-05-14 MED ORDER — ACETAMINOPHEN 500 MG PO TABS
500.0000 mg | ORAL_TABLET | Freq: Four times a day (QID) | ORAL | Status: AC
  Administered 2023-05-14 – 2023-05-16 (×8): 500 mg via ORAL
  Filled 2023-05-14 (×9): qty 1

## 2023-05-14 MED ORDER — AMIODARONE IV BOLUS ONLY 150 MG/100ML
150.0000 mg | Freq: Once | INTRAVENOUS | Status: AC
  Administered 2023-05-14: 150 mg via INTRAVENOUS
  Filled 2023-05-14: qty 100

## 2023-05-14 MED ORDER — FUROSEMIDE 10 MG/ML IJ SOLN
40.0000 mg | Freq: Once | INTRAMUSCULAR | Status: AC
  Administered 2023-05-14: 40 mg via INTRAVENOUS
  Filled 2023-05-14: qty 4

## 2023-05-14 NOTE — Progress Notes (Addendum)
[facility] DAILY ICU PROGRESS NOTE                   [address]            [place] [address]          [phone]   3 Days Post-Op Procedure(s) (LRB): MITRAL VALVE (MV) REPLACEMENT USING MOSAIC VALVE SIZE (N/A) TRANSESOPHAGEAL ECHOCARDIOGRAM (N/A)  Total Length of Stay:  LOS: 3 days   Subjective: Up in the bedside chair.  Said she has walked in the room but not out in the hall yet.  Pain controlled. Said she wants to avoid the pain medications because they make her itch.  PRBC's infusing now.  On 3LNC with stable sats. Back on NE briefly early this morning.   BM x 2 since surgery.  Objective: Vital signs in last 24 hours: Temp:  [98.3 F (36.8 C)-99.8 F (37.7 C)] 98.3 F (36.8 C) (12/16 0739) Pulse Rate:  [64-88] 76 (12/16 0800) Cardiac Rhythm: Heart block (12/16 0800) Resp:  [11-29] 17 (12/16 0800) BP: (50-129)/(17-83) 112/52 (12/16 0800) SpO2:  [85 %-100 %] 98 % (12/16 0800) Arterial Line BP: (80-128)/(39-73) 108/43 (12/15 1400) Weight:  [88.8 kg] 88.8 kg (12/16 0334)  Filed Weights   05/12/23 0600 05/13/23 0500 05/14/23 0334  Weight: 90.6 kg 89.1 kg 88.8 kg    Weight change: -0.3 kg  Intake/Output from previous day: 12/15 0701 - 12/16 0700 In: 1391 [P.O.:1210; I.V.:169; IV Piggyback:12] Out: 1081 [Urine:1060; Stool:1; Chest Tube:20]  Intake/Output this shift: No intake/output data recorded.  Current Meds: Scheduled Meds:  sodium chloride   Intravenous Once   acetaminophen  1,000 mg Oral Q6H   Or   acetaminophen (TYLENOL) oral liquid 160 mg/5 mL  1,000 mg Per Tube Q6H   amiodarone  400 mg Oral BID   arformoterol  15 mcg Nebulization BID   And   revefenacin  175 mcg Nebulization Daily   And   budesonide (PULMICORT) nebulizer solution  0.25 mg Nebulization BID   aspirin EC  325 mg Oral Daily   Or   aspirin  324 mg Per Tube Daily   bisacodyl  10 mg Oral Daily   Or   bisacodyl  10 mg Rectal Daily   Chlorhexidine Gluconate Cloth   6 each Topical Daily   clonazePAM  1 mg Oral TID   docusate sodium  200 mg Oral Daily   enoxaparin (LOVENOX) injection  40 mg Subcutaneous QHS   Fe Fum-Vit C-Vit B12-FA  1 capsule Oral QPC breakfast   gabapentin  800 mg Oral TID   insulin aspart  0-24 Units Subcutaneous TID WC   midodrine  10 mg Oral TID WC   nicotine  21 mg Transdermal Daily   pantoprazole  40 mg Oral Daily   rosuvastatin  40 mg Oral QHS   sertraline  50 mg Oral q morning   sodium chloride flush  3 mL Intravenous Q12H   Warfarin - Physician Dosing Inpatient   Does not apply q1600   Continuous Infusions:  norepinephrine (LEVOPHED) Adult infusion 2 mcg/min (05/14/23 0619)   PRN Meds:.dextrose, diphenhydrAMINE, HYDROmorphone, ipratropium-albuterol, methocarbamol, ondansetron (ZOFRAN) IV, mouth rinse, sodium chloride flush, traMADol  General appearance: alert, cooperative, and no distress Neurologic: intact Heart: regular rhythm, no murmur. SR with first degree AVB. Lungs: breath sounds are clear.  Abdomen: soft, no tenderness Extremities: no peripheral edema  Lab Results: CBC: Recent Labs    05/13/23 0317 05/14/23 0347  WBC 8.7 5.3  HGB 7.9* 7.0*  HCT 24.5* 21.7*  PLT 113* 91*   BMET:  Recent Labs    05/13/23 0317 05/14/23 0347  NA 134* 129*  K 3.7 4.3  CL 99 96*  CO2 26 24  GLUCOSE 120* 133*  BUN 14 17  CREATININE 1.38* 1.16*  CALCIUM 7.3* 7.4*    CMET: Lab Results  Component Value Date   WBC 5.3 05/14/2023   HGB 7.0 (L) 05/14/2023   HCT 21.7 (L) 05/14/2023   PLT 91 (L) 05/14/2023   GLUCOSE 133 (H) 05/14/2023   CHOL 158 12/13/2018   TRIG 133 12/13/2018   HDL 51 12/13/2018   LDLCALC 80 12/13/2018   ALT 19 05/09/2023   AST 20 05/09/2023   NA 129 (L) 05/14/2023   K 4.3 05/14/2023   CL 96 (L) 05/14/2023   CREATININE 1.16 (H) 05/14/2023   BUN 17 05/14/2023   CO2 24 05/14/2023   TSH 1.776 12/13/2018   INR 1.3 (H) 05/14/2023   HGBA1C 5.8 (H) 05/09/2023      PT/INR:  Recent Labs     05/14/23 0347  LABPROT 16.6*  INR 1.3*   Radiology: No results found.   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 SR  with first degree HB, on prophylactic amiodarone. NE weaned off yesterday but re-started briefly early this morning. On Midodrine 10mg  TID. Pacer wires are out and Coumadin anticoagulation started last PM  @2 .5mg , INR 1.3. Repeat 2.5mg  this PM.   -Expected acute blood loss anemia- HCT down to 21%, transfusing 1 unit PRBC's now. On iron replacement.   -ENDO-H/O type 2 DM treated with metformin PTA, A1C 5.8 on admission. CBGs well controlled past 24 hours. Continue SSI.  -PULM- h/o tobacco abuse, COPD. On multiple inhalers that have been restarted. Has nicotine patch. Small bilateral effusions with ATX on CXR. Encouraging mobility and pulm hygiene.   -History of major depression and anxiety- Zoloft and Klonopin resumed.   -Renal- Normal function at baseline with mild insufficiency post-op. Creat trending down. Wt is ~5kg positive.  Will give Lasix today after transfusion then d/c the Foley catheter later today.   -Disposition- Keep in ICU for now since she required NE again this morning. Expect BP will stabilize after transfusion. PT / OT evaluations underway. She is planning to discharge to an apartment where she has been living with her sister.     Leary Roca, PA-C  05/14/2023 8:07 AM Agree with above

## 2023-05-14 NOTE — Progress Notes (Signed)
      [address]       [place] [address]             [phone]      POD # 3  MVR  Ambulated a short distance today  BP (!) 117/54   Pulse (!) 59   Temp (!) 97.5 F (36.4 C) (Oral)   Resp 16   Ht 5\' 2"  (1.575 m)   Wt 88.8 kg   SpO2 95%   BMI 35.81 kg/m  Rate controlled a fib 4L [place] 99% sat Off norepi   Intake/Output Summary (Last 24 hours) at 05/14/2023 1745 Last data filed at 05/14/2023 1600 Gross per 24 hour  Intake 1422.05 ml  Output 2955 ml  Net -1532.95 ml   CBG 130s  Viviann Spare C. Dorris Fetch, MD [facility] [phone]
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
## 2023-05-14 NOTE — Discharge Summary (Signed)
[address]       [place] [address]             [phone]    Physician Discharge Summary  Patient ID: Rose [name] MRN: [id number] DOB/AGE: Jun 27, 1955 67 y.o.  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   Anxiety 09/15/2020   Constipation 09/15/2020   DDD (degenerative disc disease), lumbar 09/15/2020   Elevated liver enzymes 09/15/2020   Fatty liver 09/15/2020   Mitral regurgitation 09/15/2020   Mobility impaired 09/15/2020   Multiple allergies 09/15/2020   Arthritis 09/15/2020   Risk for falls 09/15/2020   Shortness of breath 09/15/2020   Stroke (HCC) 09/15/2020   Ventral hernia 09/15/2020   Vitiligo 09/15/2020   Acute hypoxemic respiratory failure (HCC) 12/30/2018   Anemia of chronic disease 12/30/2018   Cough 12/30/2018   Homelessness 12/30/2018   Polysubstance abuse (HCC) 12/30/2018   Suicidal ideation 12/30/2018   Severe recurrent major depression without psychotic features (HCC) 12/12/2018   Chronic hoarseness 02/26/2018   Parotid nodule 02/26/2018   Parotid sialolithiasis 02/26/2018   Generalized abdominal pain 05/11/2016   GERD (gastroesophageal reflux disease) 05/11/2016   Hematochezia 05/11/2016   History of colon polyps 05/11/2016   Nausea 05/11/2016   ADD (attention deficit disorder) 03/24/2016   Complaints of memory disturbance 03/24/2016   Depression 03/24/2016   Eczematous dermatitis of upper eyelids of both eyes 01/19/2016   Regular astigmatism of both eyes 01/19/2016   Stress at home 11/16/2015   Deformity of metatarsal bone of left foot 04/16/2015   Pain in lower limb 03/30/2015   Sinus tarsitis of left foot 03/30/2015   Arthritis of ankle, left, degenerative 03/30/2015   COPD, mild 03/24/2015   Cigarette  smoker 03/24/2015   Trochanteric bursitis of left hip 07/20/2014   Osteoarthritis of right subtalar joint 05/07/2014   Pain due to any device, implant or graft 05/07/2014   Post-traumatic osteoarthritis of right ankle 05/07/2014   Achilles tendinitis of left lower extremity 05/07/2014   Tobacco abuse 05/07/2014   Nickel allergy 02/07/2014   History of total knee arthroplasty 02/07/2014   Chronic calcific pancreatitis (HCC) 09/29/2013   Pancreatic cyst 09/29/2013   Type 2 diabetes mellitus without complications (HCC) 09/29/2013   Discharge Diagnoses:  Patient Active Problem List   Diagnosis Date Noted   S/P mitral valve replacement 05/11/2023   Endotracheally intubated 05/11/2023   Severe tricuspid regurgitation 05/11/2023   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   Anxiety 09/15/2020   Constipation 09/15/2020   DDD (degenerative disc disease), lumbar 09/15/2020   Elevated liver enzymes 09/15/2020   Fatty liver 09/15/2020   Mitral regurgitation 09/15/2020   Mobility impaired 09/15/2020   Multiple allergies 09/15/2020   Arthritis 09/15/2020   Risk for falls 09/15/2020   Shortness of breath 09/15/2020   Stroke (HCC) 09/15/2020   Ventral hernia 09/15/2020   Vitiligo 09/15/2020   Acute hypoxemic respiratory failure (HCC) 12/30/2018   Anemia of chronic disease 12/30/2018   Cough 12/30/2018   Homelessness 12/30/2018   Polysubstance abuse (HCC) 12/30/2018   Suicidal ideation 12/30/2018   Severe recurrent major depression without psychotic features (HCC) 12/12/2018   Chronic hoarseness 02/26/2018   Parotid nodule 02/26/2018   Parotid sialolithiasis 02/26/2018   Generalized abdominal pain 05/11/2016   GERD (gastroesophageal reflux disease) 05/11/2016  Hematochezia 05/11/2016   History of colon polyps 05/11/2016   Nausea 05/11/2016   ADD (attention deficit disorder) 03/24/2016   Complaints of memory disturbance 03/24/2016   Depression 03/24/2016   Eczematous dermatitis of upper  eyelids of both eyes 01/19/2016   Regular astigmatism of both eyes 01/19/2016   Stress at home 11/16/2015   Deformity of metatarsal bone of left foot 04/16/2015   Pain in lower limb 03/30/2015   Sinus tarsitis of left foot 03/30/2015   Arthritis of ankle, left, degenerative 03/30/2015   COPD, mild 03/24/2015   Cigarette smoker 03/24/2015   Trochanteric bursitis of left hip 07/20/2014   Osteoarthritis of right subtalar joint 05/07/2014   Pain due to any device, implant or graft 05/07/2014   Post-traumatic osteoarthritis of right ankle 05/07/2014   Achilles tendinitis of left lower extremity 05/07/2014   Tobacco abuse 05/07/2014   Nickel allergy 02/07/2014   History of total knee arthroplasty 02/07/2014   Chronic calcific pancreatitis (HCC) 09/29/2013   Pancreatic cyst 09/29/2013   Type 2 diabetes mellitus without complications (HCC) 09/29/2013    Discharged Condition: stable  History of Present Illness:     Rose [name] is known to Dr. Leafy Ro from earlier consult with rheumatic Mitral stenosis/Mitral regurgitation and Tricuspid regurgitation. The patient has had completed work up for surgery. She is struggling with being evicted from her apartment and still has no place to live following surgery. Her symptoms include mostly back pain currently but she is able to perform her daily activities. She denies increasing DOE. She is very nervous about the surgery.  Dr. Leafy Ro reviewed the patient's diagnostic studies and determined she would benefit from surgery. He reviewed the treatment options as well as the risks and benefits with the patient. Rose [name] was agreeable to proceed with surgery.  Hospital Course: Rose [name] arrived at [facility] and was brought to the operating room on 05/11/23. She underwent mitral valve replacement utilizing a 29mm Mosaic Mitral valve. She tolerated the procedure well and was transferred to the SICU in stable condition.  Vital signs and hemodynamics  remained stable after transfer.  She was weaned from the ventilator and extubated by 5:30 PM on the day of surgery.  By the first postoperative day, she was on low-dose norepinephrine.  She was started on midodrine for blood pressure support to allow for weaning of the norepinephrine.  The monitoring lines were removed on postop day 1.  Diuresis was begun.  On postop day 2, the pacer wires were removed and anticoagulation with Coumadin was initiated. She was mobilized. She required low dose norepinephrine for a short period, this was weaned as hemodynamics tolerated. She had expected postoperative acute blood loss anemia and was transfused with 1U PRBCs. She developed controlled rate atrial fibrillation but quickly converted to NSR with an Amiodarone bolus. She became overly sedated, home gabapentin was decreased. PT/OT evaluations recommended home health. She was felt stable for transfer to the progressive unit on 12/17. Creatinine did increase slightly to 1.29 on 12/19. INR was up to 2.1 on 12/19 so Coumadin dose was decreased to 2 mg.  She has responded well to this regimen. She overall has poor respiratory effort.  Home ambulation test indicated need for home oxygen which has been arranged, the apteit has required home oxygen in the past. Chest xray obtained did not show evidence of significant pleural effusions.  She has responded well to lasix, but developed dizziness so her lasix and potassium was discontinued. She developed episodes  of rate controlled A. Fib would convert to NSR without intervention. She remains hypotensive on Midodrine 10 mg TID. Her INR dropped from 2.7 to 2.2  to 2.1 on 2mg  of Coumadin daily. She was started on 2.5mg  Coumadin daily and discharged with an INR check 2 days later. Patient admitted she would have transportation issues for that appointment, home health arranged to check her INR at their visit on 12/27. Her surgical incisions were healing without evidence of infection.  She was  improving in ambulation with assistance on 2L [place]. Home PT/OT/RN have been arranged since she refused SNF.  She was felt stable for discharge home.   Consults:  Critical care  Significant Diagnostic Studies: cardiac graphics:   Echocardiogram:    IMPRESSIONS   1. Left ventricular ejection fraction, by estimation, is 60 to 65%. The  left ventricle has normal function. The left ventricle has no regional  wall motion abnormalities. Left ventricular diastolic function could not  be evaluated. Elevated left atrial  pressure. The E/e' is 33.   2. Right ventricular systolic function is low normal. The right  ventricular size is normal. There is normal pulmonary artery systolic  pressure.   3. Left atrial size was mildly dilated.   4. Trivial pericardial effusion is present. There is no evidence of  cardiac tamponade.   5. The mitral valve is rheumatic. Moderate to severe mitral valve  regurgitation. Severe mitral stenosis. The mean mitral valve gradient is  15.0 mmHg with average heart rate of 69 bpm.   6. The aortic valve is normal in structure. Aortic valve regurgitation is  not visualized. No aortic stenosis is present.   7. The inferior vena cava is normal in size with greater than 50%  respiratory variability, suggesting right atrial pressure of 3 mmHg.   Comparison(s): Prior TEE 11/02/2022 reported: LVEF 60-65%, RVSP ,  severe LAE, moderate RAE, severe MS (MG ), severe MR, severe TR.   Treatments: surgery:   05/11/2023 Mekeba Eledge [id number]   Surgeon:  Ashley Akin, MD   First Assistant: Aloha Gell [facility]                               An experienced assistant was required given the complexity of this surgery and the standard of surgical care. The assistant was needed for exposure, dissection, suctioning, retraction of delicate tissues and sutures, instrument exchange and for overall help during this procedure.       Preoperative Diagnosis:  Rheumatic mitral  stenosis and regurgitation                                            Tricuspid Regurgitation   Postoperative Diagnosis:  Same but Tricuspid annulus only 33mm and with mild to moderate TR and was not surgical addressed     Operation:  Mitral Valve replacement with a 29 mm Mosaic Mitral valve   Discharge Exam: Blood pressure (!) 131/57, pulse 70, temperature 99 F (37.2 C), temperature source Oral, resp. rate 18, height 5\' 2"  (1.575 m), weight 84.8 kg, SpO2 94%. General appearance: alert, cooperative, and no distress Neurologic: intact Heart: regular rate and rhythm, S1, S2 normal, no murmur, click, rub or gallop Lungs: slight wheezes throughout Abdomen: soft, non-tender; bowel sounds normal; no masses,  no organomegaly Extremities: trace edema Wound: Clean and  dry without sign of infection  Discharge Medications:  The patient has been discharged on:   1.Beta Blocker:  Yes [   ]                              No   [ x  ] hypotension on midodrine  2.Ace Inhibitor/ARB: Yes [   ]                                     No  [  x  ]                                     If No, reason: hypotension on midodrine  3.Statin:   Yes [  X ]                  No  [   ]                  If No, reason:  4.Ecasa:  Yes  [  X ]                  No   [   ]                  If No, reason:  Patient had ACS upon admission: No  Plavix/P2Y12 inhibitor: Yes [   ]                                      No  [  X ]     Discharge Instructions     Amb Referral to Cardiac Rehabilitation   Complete by: As directed    Diagnosis: Valve Replacement   Valve: Mitral   After initial evaluation and assessments completed: Virtual Based Care may be provided alone or in conjunction with Phase 2 Cardiac Rehab based on patient barriers.: Yes   Intensive Cardiac Rehabilitation (ICR) [facility] location only OR Traditional Cardiac Rehabilitation (TCR) *If criteria for ICR are not met will enroll in TCR [facility] only): Yes       Allergies as of 05/22/2023       Reactions   Erythromycin Shortness Of Breath   Nickel Rash, Shortness Of Breath   Oxycodone Shortness Of Breath   Meperidine Other (See Comments)   Confusion and tingling  (noted in H&P)   Codeine Swelling   Hydrocodone-acetaminophen Itching   Tolerated Norco 11/2018   Morphine Itching   Propoxyphene Other (See Comments)   Ineffective    Quetiapine    Restless leg syndrome        Medication List     STOP taking these medications    meloxicam 7.5 MG tablet Commonly known as: MOBIC       TAKE these medications    acetaminophen 325 MG tablet Commonly known as: Tylenol Take 2 tablets (650 mg total) by mouth every 4 (four) hours as needed.   aspirin EC 81 MG tablet Take 1 tablet (81 mg total) by mouth daily. Swallow whole.   clonazePAM 1 MG tablet Commonly known as: KLONOPIN Take 1 mg by mouth 3 (three) times daily.   Creon 24000-76000 units Cpep Generic  drug: Pancrelipase (Lip-Prot-Amyl) Take 1 capsule by mouth 3 (three) times daily after meals.   gabapentin 800 MG tablet Commonly known as: NEURONTIN Take 800 mg by mouth 3 (three) times daily.   magic mouthwash Soln Take 10 mLs by mouth 3 (three) times daily as needed for mouth pain.   metFORMIN 500 MG tablet Commonly known as: GLUCOPHAGE Take 500 mg by mouth 2 (two) times daily.   methocarbamol 500 MG tablet Commonly known as: ROBAXIN Take 1 tablet (500 mg total) by mouth every 8 (eight) hours as needed for muscle spasms.   midodrine 10 MG tablet Commonly known as: PROAMATINE Take 1 tablet (10 mg total) by mouth 3 (three) times daily with meals.   nicotine 21 mg/24hr patch Commonly known as: NICODERM CQ - dosed in mg/24 hours Place 1 patch (21 mg total) onto the skin daily.   omeprazole 40 MG capsule Commonly known as: PRILOSEC Take 40 mg by mouth daily.   ondansetron 4 MG tablet Commonly known as: ZOFRAN Take 4 mg by mouth every 8 (eight) hours as needed  for nausea or vomiting.   rOPINIRole 1 MG tablet Commonly known as: REQUIP Take 1 mg by mouth at bedtime.   rosuvastatin 40 MG tablet Commonly known as: CRESTOR Take 1 tablet (40 mg total) by mouth at bedtime.   sertraline 50 MG tablet Commonly known as: ZOLOFT Take 50 mg by mouth every morning.   traMADol 50 MG tablet Commonly known as: ULTRAM Take 1 tablet (50 mg total) by mouth every 6 (six) hours as needed for moderate pain (pain score 4-6).   traZODone 50 MG tablet Commonly known as: DESYREL Take 150 mg by mouth at bedtime.   Trelegy Ellipta 200-62.5-25 MCG/ACT Aepb Generic drug: Fluticasone-Umeclidin-Vilant Inhale 200 mcg into the lungs daily.   warfarin 2.5 MG tablet Commonly known as: COUMADIN Take 1 tablet (2.5 mg total) by mouth one time only at 4 PM.        Follow-up Information     [facility] Follow up on 05/31/2023.   Specialty: Cardiothoracic Surgery Why: Follow up appointment is at 1:30PM Contact information: [address] [place], [address] [place] [address] [phone]         [facility] Follow up on 05/31/2023.   Why: To get a chest xray at 12:30PM, 1 hour prior to your appointment Contact information: [address] [place] [address]        Gaston Islam., NP Follow up on 05/28/2023.   Specialty: Cardiology Why: Cardiology follow up is at 10:55AM Contact information: [address] [place] [address] [phone]         [facility] Follow up.   Specialty: Cardiology Why: Post-op Echo scheduled for 06/22/2023 at 11:15am. Please arrive 15 minutes early for check-in. Contact information: [address], [address] [place] [address] [phone]        [facility] Follow up.   Why: Physical Therapy and Clinical Social Worker-office to call  the patient with visit times.   HHRN will to INR check on 12/27 Contact information: [address] [place] [address] [phone]         [facility] [employer] Follow up.   Why: Rolling walker  and bedside commode to be delivered portable 02 will be delivered to room  for transport home ([employer] to change out rollator for RW  as well)  shower chair also to be delivered to room per pt request Contact information: [employer] Address: [address], [place], [place] [address]        [facility] Follow up on 05/24/2023.   Specialty: Cardiology Why: Appointment is at 10:30AM for PT/INR check Contact information: [address] [place] [address] [phone]        [facility] Follow up.   Why: ([employer])- home 02 arranged- they will contact you for delivery of concentrator to the home. Contact information: [address] [place] [address] [phone]                 Signed:  Jenny Reichmann, PA-C  05/24/2023, 9:33 AM
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
## 2023-05-14 NOTE — Progress Notes (Signed)
   NAME:  Nael Habte, MRN:  [id number], DOB:  06-10-1955, LOS: 3 ADMISSION DATE:  05/11/2023, CONSULTATION DATE:  05/11/23 REFERRING MD:  Leafy Ro - cvts, CHIEF COMPLAINT:  s/p MVR    History of Present Illness:  67 yo F PMH rheumatic mitral stenosis, mitral regurgitation, severe tricuspid regurgitation, CAD, COPD who presented 05/11/23 for planned MV replacement and TV repair  Case unremarkable EBL 500cc Received 250 cell saver Total pump 89 min Xclamp 68 min   Admitted to ICU post op, intubated, as per pre-op plan   Pertinent  Medical History  Rheumatic mitral stenosis/ MVR, Severe TVR, CAD, COPD   Significant Hospital Events: Including procedures, antibiotic start and stop dates in addition to other pertinent events   12/13 Mitral valve replacement  12/16 mild hypotension with borderline anemia received 1 unit PRBC this morning  Interim History / Subjective:  Seen lying in bed post ambulating in hallway, denies any acute complaints  Objective   Blood pressure (!) 112/52, pulse 76, temperature 98.3 F (36.8 C), temperature source Oral, resp. rate 17, height 5\' 2"  (1.575 m), weight 88.8 kg, SpO2 98%.        Intake/Output Summary (Last 24 hours) at 05/14/2023 0912 Last data filed at 05/14/2023 0600 Gross per 24 hour  Intake 1193.11 ml  Output 986 ml  Net 207.11 ml   Filed Weights   05/12/23 0600 05/13/23 0500 05/14/23 0334  Weight: 90.6 kg 89.1 kg 88.8 kg    Examination: General: Acute on chronic ill-appearing deconditioned middle-aged female lying in bed in no acute distress HEENT: [place]/AT, MM pink/moist, PERRL,  Neuro: Alert and oriented x 3, nonfocal CV: s1s2 regular rate and rhythm, no murmur, rubs, or gallops,  PULM: Clear to auscultation bilaterally, no increased work of breathing, no added breath sounds GI: soft, bowel sounds active in all 4 quadrants, non-tender, non-distended, tolerating oral diet Extremities: warm/dry, no edema  Skin: no rashes or  lesions   Resolved Hospital Problem list     Assessment & Plan:   Rheumatic mitral stenosis, mitral regurg s/p MVR Postop vasoplegia, atelectasis P: Primary management per TCTS Continue amiodarone Continue pressors for MAP goal greater than 65 Continue 3 times daily midodrine Anticoagulated with Coumadin Continuous telemetry Optimize electrolytes Multimodal pain control  COPD mild/moderate by PFTs although DLCO is pretty low Tobacco use  P: Continue supplemental oxygen SpO2 goal greater than 90 Continue scheduled bronchodilators Nicotine patch Mobilize as able Encourage pulmonary hygiene  Baseline anxiety/pain  P: Continue home Zoloft and Klonopin  Expected acute blood loss anemia P: Receiving 1 unit PRBCs this morning Trend CBC Transfuse per protocol Hemoglobin goal greater than 7  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 Harris, NP-C  Pulmonary & Critical Care Personal contact information can be found on Amion  If no contact or response made please call [phone] 05/14/2023, 9:17 AM

## 2023-05-15 ENCOUNTER — Encounter (HOSPITAL_COMMUNITY): Payer: Self-pay | Admitting: Thoracic Surgery (Cardiothoracic Vascular Surgery)

## 2023-05-15 ENCOUNTER — Other Ambulatory Visit: Payer: Self-pay | Admitting: Student

## 2023-05-15 DIAGNOSIS — Z952 Presence of prosthetic heart valve: Secondary | ICD-10-CM | POA: Diagnosis not present

## 2023-05-15 DIAGNOSIS — I051 Rheumatic mitral insufficiency: Secondary | ICD-10-CM | POA: Diagnosis not present

## 2023-05-15 LAB — TYPE AND SCREEN
ABO/RH(D): A POS
Antibody Screen: NEGATIVE
Unit division: 0

## 2023-05-15 LAB — BASIC METABOLIC PANEL
Anion gap: 6 (ref 5–15)
BUN: 24 mg/dL — ABNORMAL HIGH (ref 8–23)
CO2: 28 mmol/L (ref 22–32)
Calcium: 7.4 mg/dL — ABNORMAL LOW (ref 8.9–10.3)
Chloride: 101 mmol/L (ref 98–111)
Creatinine, Ser: 1.15 mg/dL — ABNORMAL HIGH (ref 0.44–1.00)
GFR, Estimated: 53 mL/min — ABNORMAL LOW (ref >60)
Glucose, Bld: 101 mg/dL — ABNORMAL HIGH (ref 70–99)
Potassium: 3.7 mmol/L (ref 3.5–5.1)
Sodium: 135 mmol/L (ref 135–145)

## 2023-05-15 LAB — CBC
HCT: 23.9 % — ABNORMAL LOW (ref 36.0–46.0)
Hemoglobin: 7.7 g/dL — ABNORMAL LOW (ref 12.0–15.0)
MCH: 28.2 pg (ref 26.0–34.0)
MCHC: 32.2 g/dL (ref 30.0–36.0)
MCV: 87.5 fL (ref 80.0–100.0)
Platelets: 101 10*3/uL — ABNORMAL LOW (ref 150–400)
RBC: 2.73 MIL/uL — ABNORMAL LOW (ref 3.87–5.11)
RDW: 15.7 % — ABNORMAL HIGH (ref 11.5–15.5)
WBC: 4.3 10*3/uL (ref 4.0–10.5)
nRBC: 0 % (ref 0.0–0.2)

## 2023-05-15 LAB — PROTIME-INR
INR: 1.6 — ABNORMAL HIGH (ref 0.8–1.2)
Prothrombin Time: 19.3 s — ABNORMAL HIGH (ref 11.4–15.2)

## 2023-05-15 LAB — BPAM RBC
Blood Product Expiration Date: 202412302359
ISSUE DATE / TIME: 202412160648
Unit Type and Rh: 6200

## 2023-05-15 LAB — GLUCOSE, CAPILLARY
Glucose-Capillary: 118 mg/dL — ABNORMAL HIGH (ref 70–99)
Glucose-Capillary: 156 mg/dL — ABNORMAL HIGH (ref 70–99)
Glucose-Capillary: 161 mg/dL — ABNORMAL HIGH (ref 70–99)
Glucose-Capillary: 129 mg/dL — ABNORMAL HIGH (ref 70–99)

## 2023-05-15 MED ORDER — WARFARIN SODIUM 2.5 MG PO TABS
2.5000 mg | ORAL_TABLET | Freq: Once | ORAL | Status: AC
  Administered 2023-05-15: 2.5 mg via ORAL
  Filled 2023-05-15: qty 1

## 2023-05-15 MED ORDER — ASPIRIN 81 MG PO TBEC
81.0000 mg | DELAYED_RELEASE_TABLET | Freq: Every day | ORAL | Status: DC
  Administered 2023-05-16 – 2023-05-22 (×7): 81 mg via ORAL
  Filled 2023-05-15 (×7): qty 1

## 2023-05-15 MED ORDER — TRAZODONE HCL 50 MG PO TABS
50.0000 mg | ORAL_TABLET | Freq: Every evening | ORAL | Status: DC | PRN
  Administered 2023-05-15 – 2023-05-18 (×4): 50 mg via ORAL
  Filled 2023-05-15 (×4): qty 1

## 2023-05-15 MED ORDER — GUAIFENESIN 100 MG/5ML PO LIQD
10.0000 mL | Freq: Three times a day (TID) | ORAL | Status: DC
  Administered 2023-05-15 – 2023-05-22 (×22): 10 mL via ORAL
  Filled 2023-05-15 (×22): qty 10

## 2023-05-15 MED ORDER — PANCRELIPASE (LIP-PROT-AMYL) 24000-76000 UNITS PO CPEP
1.0000 | ORAL_CAPSULE | Freq: Three times a day (TID) | ORAL | Status: DC
Start: 2023-05-15 — End: 2023-05-15

## 2023-05-15 MED ORDER — FUROSEMIDE 40 MG PO TABS
40.0000 mg | ORAL_TABLET | Freq: Every day | ORAL | Status: DC
Start: 2023-05-15 — End: 2023-05-20
  Administered 2023-05-15 – 2023-05-19 (×5): 40 mg via ORAL
  Filled 2023-05-15 (×6): qty 1

## 2023-05-15 MED ORDER — POTASSIUM CHLORIDE CRYS ER 20 MEQ PO TBCR
20.0000 meq | EXTENDED_RELEASE_TABLET | ORAL | Status: AC
  Administered 2023-05-15 (×3): 20 meq via ORAL
  Filled 2023-05-15 (×3): qty 1

## 2023-05-15 MED ORDER — PANCRELIPASE (LIP-PROT-AMYL) 12000-38000 UNITS PO CPEP
24000.0000 [IU] | ORAL_CAPSULE | Freq: Three times a day (TID) | ORAL | Status: DC
Start: 2023-05-15 — End: 2023-05-22
  Administered 2023-05-15 – 2023-05-22 (×21): 24000 [IU] via ORAL
  Filled 2023-05-15 (×23): qty 2

## 2023-05-15 NOTE — Progress Notes (Signed)
   Ordered 6 week post-op Echo following mitral valve replacement per protocol. Will need structural reader.   Primary Cardiologist: Dr. [name] CT Surgeon: Dr. Debarah Crape, PA-C 05/15/2023 12:11 PM

## 2023-05-15 NOTE — Progress Notes (Signed)
   NAME:  Rose [name], MRN:  [id number], DOB:  November 09, 1955, LOS: 4 ADMISSION DATE:  05/11/2023, CONSULTATION DATE:  05/11/23 REFERRING MD:  Leafy Ro - cvts, CHIEF COMPLAINT:  s/p MVR    History of Present Illness:  67 yo F PMH rheumatic mitral stenosis, mitral regurgitation, severe tricuspid regurgitation, CAD, COPD who presented 05/11/23 for planned MV replacement and TV repair  Case unremarkable EBL 500cc Received 250 cell saver Total pump 89 min Xclamp 68 min   Admitted to ICU post op, intubated, as per pre-op plan   Pertinent  Medical History  Rheumatic mitral stenosis/ MVR, Severe TVR, CAD, COPD   Significant Hospital Events: Including procedures, antibiotic start and stop dates in addition to other pertinent events   12/13 Mitral valve replacement  12/16 mild hypotension with borderline anemia received 1 unit PRBC this morning  Interim History / Subjective:  Up in bed; no complaints Congested cough  Objective   Blood pressure (!) 131/59, pulse 72, temperature 98 F (36.7 C), temperature source Oral, resp. rate 17, height 5\' 2"  (1.575 m), weight 88.8 kg, SpO2 97%.        Intake/Output Summary (Last 24 hours) at 05/15/2023 1040 Last data filed at 05/15/2023 0600 Gross per 24 hour  Intake 842.67 ml  Output 1825 ml  Net -982.33 ml   Filed Weights   05/13/23 0500 05/14/23 0334 05/15/23 0156  Weight: 89.1 kg 88.8 kg 88.8 kg    Examination: General:  NAD HEENT: MM pink/moist Neuro: Aox3; MAE CV: s1s2, RRR, no m/r/g PULM:  dim clear BS bilaterally GI: soft, bsx4 active  Extremities: warm/dry, no edema  Skin: no rashes or lesions    Resolved Hospital Problem list     Assessment & Plan:   Rheumatic mitral stenosis, mitral regurg s/p MVR Postop vasoplegia, atelectasis P: - Postoperative care per TCTS - plan to pull central line - likely can transfer to progressive - ASA and statin - pain control - cont warfarin and amio - cont midodrine  COPD  mild/moderate by PFTs although DLCO is pretty low Tobacco use  P: -cont [place] and wean for sats >92% -pulm toiletry -cont triple therapy maintenance nebs; prn duoneb for wheezing -will add guaifenesin for congested cough -nicotine patch  Baseline anxiety/pain  P: -Continue home Zoloft and Klonopin  Expected acute blood loss anemia P: -trend cbc and transfuse per protocol  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  Order written to d/c  Foley:  N/A Code Status:  full code Last date of multidisciplinary goals of care discussion: Continue ongoing aggressive interventions, update patient and family daily   JD Anselm Lis Clayton Pulmonary & Critical Care 05/15/2023, 10:46 AM  Please see [email or web address] for pager details.  From 7A-7P if no response, please call [phone]. After hours, please call ELink [phone].

## 2023-05-15 NOTE — TOC Initial Note (Signed)
Transition of Care [facility]) - Initial/Assessment Note    Patient Details  Name: Rose [name] MRN: [id number] Date of Birth: Aug 24, 1955  Transition of Care [facility]) CM/SW Contact:    Gala Lewandowsky, RN Phone Number: 05/15/2023, 2:52 PM  Clinical Narrative:  Patient post MVR. PTA patient was from home with sister. CSW has spoken with the patient regarding housing resources. Patient reports that she will have to be out of her apartment by Jun 25, 2023. Case Manager discussed adding a CSW to home health PT and the patient is agreeable. Patient will be seen via [facility] via office protocol. Patient will need orders for [facility] PT/CSW once stable. Case Manager ordered DME rollator and BSC via [employer]- orders will be needed for delivery. Case Manager will continue to follow for additional transition of care needs as the patient progresses.                  Expected Discharge Plan: Home w Home Health Services Barriers to Discharge: Continued Medical Work up  Patient Goals and CMS Choice Patient states their goals for this hospitalization and ongoing recovery are:: Patient to return to current address   Choice offered to / list presented to :  (office protocol for [facility] PT-added CSW)    Expected Discharge Plan and Services In-house Referral: Clinical Social Work Discharge Planning Services: CM Consult Post Acute Care Choice: Home Health Living arrangements for the past 2 months: Apartment                 DME Arranged: Walker rolling with seat, Bedside commode   Date DME Agency Contacted: 05/15/23 Time DME Agency Contacted: [phone] Representative spoke with at DME Agency: Vaughan Basta HH Arranged: Social Work, PT HH Agency: [facility] ([facility]) Date HH Agency Contacted: 05/15/23 Time HH Agency Contacted: [phone] Representative spoke with at [facility] Agency: Ashley-coverage  Prior Living Arrangements/Services Living arrangements for the past 2 months: Apartment Lives with::  Siblings Patient language and need for interpreter reviewed:: Yes Do you feel safe going back to the place where you live?: Yes      Need for Family Participation in Patient Care: Yes (Comment) Care giver support system in place?: Yes (comment)   Criminal Activity/Legal Involvement Pertinent to Current Situation/Hospitalization: No - Comment as needed    Permission Sought/Granted Permission sought to share information with : Family Supports, Case Manager Permission granted to share information with : Yes, Verbal Permission Granted     Permission granted to share info w AGENCY: [facility]   Emotional Assessment Appearance:: Appears stated age Attitude/Demeanor/Rapport: Engaged Affect (typically observed): Appropriate Orientation: : Oriented to Self, Oriented to Place, Oriented to  Time, Oriented to Situation Alcohol / Substance Use: Not Applicable Psych Involvement: No (comment)  Admission diagnosis:  S/P mitral valve replacement [Z95.2] Patient Active Problem List   Diagnosis Date Noted   S/P mitral valve replacement 05/11/2023   Endotracheally intubated 05/11/2023   Severe tricuspid regurgitation 05/11/2023   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   Anxiety 09/15/2020   Constipation 09/15/2020   DDD (degenerative disc disease), lumbar 09/15/2020   Elevated liver enzymes 09/15/2020   Fatty liver 09/15/2020   Mitral regurgitation 09/15/2020   Mobility impaired 09/15/2020   Multiple allergies 09/15/2020   Arthritis 09/15/2020   Risk for falls 09/15/2020   Shortness of breath 09/15/2020   Stroke (HCC) 09/15/2020   Ventral hernia 09/15/2020   Vitiligo 09/15/2020   Acute hypoxemic respiratory failure (HCC) 12/30/2018   Anemia of chronic disease 12/30/2018  Cough 12/30/2018   Homelessness 12/30/2018   Polysubstance abuse (HCC) 12/30/2018   Suicidal ideation 12/30/2018   Severe recurrent major depression without psychotic features (HCC) 12/12/2018   Chronic hoarseness  02/26/2018   Parotid nodule 02/26/2018   Parotid sialolithiasis 02/26/2018   Generalized abdominal pain 05/11/2016   GERD (gastroesophageal reflux disease) 05/11/2016   Hematochezia 05/11/2016   History of colon polyps 05/11/2016   Nausea 05/11/2016   ADD (attention deficit disorder) 03/24/2016   Complaints of memory disturbance 03/24/2016   Depression 03/24/2016   Eczematous dermatitis of upper eyelids of both eyes 01/19/2016   Regular astigmatism of both eyes 01/19/2016   Stress at home 11/16/2015   Deformity of metatarsal bone of left foot 04/16/2015   Pain in lower limb 03/30/2015   Sinus tarsitis of left foot 03/30/2015   Arthritis of ankle, left, degenerative 03/30/2015   COPD, mild 03/24/2015   Cigarette smoker 03/24/2015   Trochanteric bursitis of left hip 07/20/2014   Osteoarthritis of right subtalar joint 05/07/2014   Pain due to any device, implant or graft 05/07/2014   Post-traumatic osteoarthritis of right ankle 05/07/2014   Achilles tendinitis of left lower extremity 05/07/2014   Tobacco abuse 05/07/2014   Nickel allergy 02/07/2014   History of total knee arthroplasty 02/07/2014   Chronic calcific pancreatitis (HCC) 09/29/2013   Pancreatic cyst 09/29/2013   Type 2 diabetes mellitus without complications (HCC) 09/29/2013   PCP:  Cristino Martes, NP Pharmacy:   [facility] [place], [place] - [address] [place] [address] Phone: [phone] Fax: [phone]  Social Drivers of Health (SDOH) Social History: SDOH Screenings   Food Insecurity: No Food Insecurity (05/13/2023)  Housing: High Risk (05/11/2023)  Transportation Needs: No Transportation Needs (05/11/2023)  Utilities: At Risk (05/11/2023)  Alcohol Screen: Low Risk  (12/13/2018)  Financial Resource Strain: Medium Risk (01/25/2023)  Social Connections: Unknown (05/17/2022)   [facility] from [facility]  Tobacco Use: High Risk (05/11/2023)   SDOH  Interventions: Housing Interventions: Community Resources Provided Transportation Interventions: Intervention Not Indicated Utilities Interventions: [employer] Provided   Readmission Risk Interventions     No data to display

## 2023-05-15 NOTE — Progress Notes (Signed)
CSW received consult for patient. CSW spoke with patient at bedside. Patient reports PTA she comes from home. Patient reports her sister lives with her. Patient informed CSW that her plan when medically ready is to return back home with her sister.  Patient informed CSW that she will be leaving her current residence in January, and is currently working with [facility] on finding housing. CSW offered patient [facility] [employer]. Patient accepted. CSW offered patient area shelter list patient politely declined. Patient reports she plans on following up with [employer] on housing placement. Patient reports she will have transportation when medically ready for dc through [employer]. All questions answered. No further questions reported at this time.

## 2023-05-15 NOTE — Progress Notes (Addendum)
[address]       [place] [address]             [phone]      4 Days Post-Op Procedure(s) (LRB): MITRAL VALVE (MV) REPLACEMENT USING MOSAIC VALVE SIZE (N/A) TRANSESOPHAGEAL ECHOCARDIOGRAM (N/A) Subjective: Pt up on bedside commode this AM. The patient states she does not feel good this AM and gets dizzy when she gets out of bed. She states she is tired and just wants to sleep.  Objective: Vital signs in last 24 hours: Temp:  [97.5 F (36.4 C)-98.7 F (37.1 C)] 98 F (36.7 C) (12/16 2017) Pulse Rate:  [59-79] 73 (12/17 0700) Cardiac Rhythm: Atrial fibrillation (12/16 2014) Resp:  [14-23] 15 (12/17 0700) BP: (93-122)/(45-96) 107/57 (12/17 0700) SpO2:  [89 %-99 %] 99 % (12/17 0700) Weight:  [88.8 kg] 88.8 kg (12/17 0156)  Hemodynamic parameters for last 24 hours:    Intake/Output from previous day: 12/16 0701 - 12/17 0700 In: 1277.7 [P.O.:720; I.V.:242.7; Blood:315] Out: 2125 [Urine:2125] Intake/Output this shift: No intake/output data recorded.  General appearance: alert, cooperative, and no distress Neurologic: intact Heart: regular rate and rhythm, S1, S2 normal, no murmur, click, rub or gallop Lungs: diminished breath sounds bibasilar Abdomen: soft, non-tender; bowel sounds normal; no masses,  no organomegaly Extremities: edema 1+ Wound: Clean and dry without sign of infection  Lab Results: Recent Labs    05/14/23 0951 05/15/23 0530  WBC 5.2 4.3  HGB 8.0* 7.7*  HCT 24.5* 23.9*  PLT 93* 101*   BMET:  Recent Labs    05/14/23 0347 05/15/23 0530  NA 129* 135  K 4.3 3.7  CL 96* 101  CO2 24 28  GLUCOSE 133* 101*  BUN 17 24*  CREATININE 1.16* 1.15*  CALCIUM 7.4* 7.4*    PT/INR:  Recent Labs    05/15/23 0530  LABPROT 19.3*  INR 1.6*   ABG    Component Value Date/Time   PHART 7.321 (L) 05/11/2023 1907   HCO3 24.8 05/11/2023 1907   TCO2 26 05/11/2023 1907   ACIDBASEDEF 1.0 05/11/2023 1907   O2SAT 93 05/11/2023  1907   CBG (last 3)  Recent Labs    05/14/23 1557 05/14/23 2156 05/15/23 0651  GLUCAP 132* 120* 118*    Assessment/Plan: S/P Procedure(s) (LRB): MITRAL VALVE (MV) REPLACEMENT USING MOSAIC VALVE SIZE (N/A) TRANSESOPHAGEAL ECHOCARDIOGRAM (N/A)  CV: NSR, HR 70s this AM. On prophylactic Amiodarone 400mg  BID but patient went into controlled rate atrial fibrillation yesterday. On Midodrine TID. SBP 107 this AM. Off drips.   Pulm: Saturating well on 4L [place]. Last CXR with small right pleural effusion and bibasilar atelectasis. Encourage IS and ambulation. Cotninue nebs, will add flutter valve  GI: +BM, tolerating a diet  Endo: Hx of DM. Preop A1C 5.8. On Metformin 500mg  BID at home. CBGs controlled on SSI. Will restart Metformin when her diet improves.    Renal: Cr 1.15, stable. UO 2125cc/24hrs. +12lbs from preop. K 3.7, supplement. Diureses held yesterday due to brief need for low dose norepinephrine, will restart diuresis.  Expected postop ABLA: Transfused with 1U PRBCs yesterday. H/H 7.7/23.9 today. Continue iron supplement  Expected postoperative thrombocytopenia: Plt 101,000, trending up.   INR: INR 1.6 this AM from 1.3 yesterday. Coumadin 2.5mg  yesterday. Continue heparin bridge and coumadin 2.5mg . Goal INR 2.5-3.5  Dispo: Possibly transfer to 4E today. Critical care to see prior to transfer.    LOS: 4 days    Fredric Mare  C Stehler, PA-C 05/15/2023  Agree with above

## 2023-05-15 NOTE — Progress Notes (Signed)
Physical Therapy Treatment Patient Details Name: Rose [name] MRN: [id number] DOB: 1955/11/06 Today's Date: 05/15/2023   History of Present Illness 67 yo F admitted 12/13 for planned MVR.  PMH: rheumatic mitral stenosis, mitral regurgitation, severe tricuspid regurgitation, CAD, COPD, ADD, depression    PT Comments  Pt willing to mobilize with education for transfers, precautions and safety. 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 Pt refused potential option of hospital bed rental stating she has to have queen size bed for dog to sleep with her and will plan to sleep on the floor. Pt needs assist to rise from bed level surface and is not yet capable of maintaining precautions and sleeping on the floor. Pt educated for HEP, IS use (pulling 750cc), sternal precautions and safety. Home situation continues to be a challenge with HHPT recommended.   HR 77-90 SPO2 88% on RA 91% on 1L with gait BP 131/59    If plan is discharge home, recommend the following: A little help with walking and/or transfers;A little help with bathing/dressing/bathroom;Assist for transportation   Can travel by private Psychologist, clinical (4 wheels);BSC/3in1    Recommendations for Other Services       Precautions / Restrictions Precautions Precautions: Sternal;Fall Precaution Booklet Issued: No Precaution Comments: watch SPO2 Restrictions Weight Bearing Restrictions Per Provider Order: Yes RUE Weight Bearing Per Provider Order: Non weight bearing LUE Weight Bearing Per Provider Order: Non weight bearing     Mobility  Bed Mobility Overal bed mobility: Needs Assistance Bed Mobility: Supine to Sit     Supine to sit: Min assist, HOB elevated     General bed mobility comments: HOB 25 degrees, min assist to elevate trunk from surface, cues for sequence and precautions    Transfers Overall transfer level:  Needs assistance   Transfers: Sit to/from Stand Sit to Stand: Supervision           General transfer comment: cues for hand placement    Ambulation/Gait Ambulation/Gait assistance: Contact guard assist Gait Distance (Feet): 400 Feet Assistive device: Rolling walker (2 wheels) Gait Pattern/deviations: Step-through pattern, Decreased stride length   Gait velocity interpretation: <1.8 ft/sec, indicate of risk for recurrent falls   General Gait Details: pt able to walk 400' with RW with SPO2 91% on 1L. Pt with initial posterior LOB requiring tactile cues to recover but otherwise slow steady pace with reliance on RW for support   Stairs             Wheelchair Mobility     Tilt Bed    Modified Rankin (Stroke Patients Only)       Balance Overall balance assessment: Mild deficits observed, not formally tested                                          Cognition Arousal: Alert Behavior During Therapy: WFL for tasks assessed/performed Overall Cognitive Status: Within Functional Limits for tasks assessed                                 General Comments: pt able to state 2/4 precautions and educated for all        Exercises General Exercises - Lower Extremity Long Arc Quad: AROM,  Both, Seated, 20 reps Hip Flexion/Marching: AROM, Both, Seated, 10 reps    General Comments        Pertinent Vitals/Pain Pain Assessment Pain Assessment: No/denies pain    Home Living                          Prior Function            PT Goals (current goals can now be found in the care plan section) Progress towards PT goals: Progressing toward goals    Frequency    Min 1X/week      PT Plan      Co-evaluation              AM-PAC PT "6 Clicks" Mobility   Outcome Measure  Help needed turning from your back to your side while in a flat bed without using bedrails?: A Little Help needed moving from lying on your back  to sitting on the side of a flat bed without using bedrails?: A Little Help needed moving to and from a bed to a chair (including a wheelchair)?: A Little Help needed standing up from a chair using your arms (e.g., wheelchair or bedside chair)?: A Little Help needed to walk in hospital room?: A Little Help needed climbing 3-5 steps with a railing? : A Lot 6 Click Score: 17    End of Session Equipment Utilized During Treatment: Oxygen Activity Tolerance: Patient tolerated treatment well Patient left: in chair;with call bell/phone within reach Nurse Communication: Mobility status PT Visit Diagnosis: Difficulty in walking, not elsewhere classified (R26.2);Other abnormalities of gait and mobility (R26.89)     Time: 1000-1033 PT Time Calculation (min) (ACUTE ONLY): 33 min  Charges:    $Gait Training: 8-22 mins $Therapeutic Activity: 8-22 mins PT General Charges $$ ACUTE PT VISIT: 1 Visit                     Merryl Hacker, PT Acute Rehabilitation Services Office: [phone]    Dore Oquin B Calandria Mullings 05/15/2023, 11:08 AM

## 2023-05-15 NOTE — Progress Notes (Signed)
      [address]       [place] [address]             [phone]    As discussed with Dr. Leafy Ro goal INR for this patient with bioprosthetic mitral valve and history of atrial fibrillation will be 2.5-3. Will continue Coumadin 2.5mg . Patient is not on IV heparin bridge. Will decrease ASA 325mg  to ASA 81mg .   Pt will also need SNF arrangements due to recent eviction from home.   Jenny Reichmann, PA-C 05/15/23

## 2023-05-15 NOTE — Plan of Care (Signed)
  Problem: Education: Goal: Knowledge of General Education information will improve Description: Including pain rating scale, medication(s)/side effects and non-pharmacologic comfort measures Outcome: Progressing   Problem: Health Behavior/Discharge Planning: Goal: Ability to manage health-related needs will improve Outcome: Progressing   Problem: Clinical Measurements: Goal: Ability to maintain clinical measurements within normal limits will improve Outcome: Progressing Goal: Will remain free from infection Outcome: Progressing Goal: Diagnostic test results will improve Outcome: Progressing Goal: Respiratory complications will improve Outcome: Progressing Goal: Cardiovascular complication will be avoided Outcome: Progressing   Problem: Activity: Goal: Risk for activity intolerance will decrease Outcome: Progressing   Problem: Nutrition: Goal: Adequate nutrition will be maintained Outcome: Progressing   Problem: Coping: Goal: Level of anxiety will decrease Outcome: Progressing   Problem: Elimination: Goal: Will not experience complications related to bowel motility Outcome: Progressing Goal: Will not experience complications related to urinary retention Outcome: Progressing   Problem: Pain Management: Goal: General experience of comfort will improve Outcome: Progressing   Problem: Safety: Goal: Ability to remain free from injury will improve Outcome: Progressing   Problem: Skin Integrity: Goal: Risk for impaired skin integrity will decrease Outcome: Progressing   Problem: Education: Goal: Will demonstrate proper wound care and an understanding of methods to prevent future damage Outcome: Progressing Goal: Knowledge of disease or condition will improve Outcome: Progressing Goal: Knowledge of the prescribed therapeutic regimen will improve Outcome: Progressing Goal: Individualized Educational Video(s) Outcome: Progressing   Problem: Activity: Goal: Risk for  activity intolerance will decrease Outcome: Progressing   Problem: Cardiac: Goal: Will achieve and/or maintain hemodynamic stability Outcome: Progressing   Problem: Clinical Measurements: Goal: Postoperative complications will be avoided or minimized Outcome: Progressing   Problem: Respiratory: Goal: Respiratory status will improve Outcome: Progressing   Problem: Skin Integrity: Goal: Wound healing without signs and symptoms of infection Outcome: Progressing Goal: Risk for impaired skin integrity will decrease Outcome: Progressing   Problem: Urinary Elimination: Goal: Ability to achieve and maintain adequate renal perfusion and functioning will improve Outcome: Progressing

## 2023-05-16 ENCOUNTER — Inpatient Hospital Stay (HOSPITAL_COMMUNITY): Payer: 59

## 2023-05-16 LAB — POCT I-STAT, CHEM 8
BUN: 17 mg/dL (ref 8–23)
Calcium, Ion: 1.01 mmol/L — ABNORMAL LOW (ref 1.15–1.40)
Chloride: 102 mmol/L (ref 98–111)
Creatinine, Ser: 0.9 mg/dL (ref 0.44–1.00)
Glucose, Bld: 211 mg/dL — ABNORMAL HIGH (ref 70–99)
HCT: 20 % — ABNORMAL LOW (ref 36.0–46.0)
Hemoglobin: 6.8 g/dL — CL (ref 12.0–15.0)
Potassium: 4.4 mmol/L (ref 3.5–5.1)
Sodium: 136 mmol/L (ref 135–145)
TCO2: 26 mmol/L (ref 22–32)

## 2023-05-16 LAB — ECHO INTRAOPERATIVE TEE
AR max vel: 1.39 cm2
AV Area VTI: 1.58 cm2
AV Area mean vel: 1.58 cm2
AV Mean grad: 4 mm[Hg]
AV Peak grad: 9.4 mm[Hg]
Ao pk vel: 1.53 m/s
Height: 62 in
MV VTI: 0.49 cm2
Weight: 2928 [oz_av]

## 2023-05-16 LAB — BASIC METABOLIC PANEL
Anion gap: 12 (ref 5–15)
BUN: 24 mg/dL — ABNORMAL HIGH (ref 8–23)
CO2: 26 mmol/L (ref 22–32)
Calcium: 7.9 mg/dL — ABNORMAL LOW (ref 8.9–10.3)
Chloride: 101 mmol/L (ref 98–111)
Creatinine, Ser: 1.11 mg/dL — ABNORMAL HIGH (ref 0.44–1.00)
GFR, Estimated: 55 mL/min — ABNORMAL LOW (ref >60)
Glucose, Bld: 115 mg/dL — ABNORMAL HIGH (ref 70–99)
Potassium: 4.2 mmol/L (ref 3.5–5.1)
Sodium: 139 mmol/L (ref 135–145)

## 2023-05-16 LAB — GLUCOSE, CAPILLARY
Glucose-Capillary: 184 mg/dL — ABNORMAL HIGH (ref 70–99)
Glucose-Capillary: 119 mg/dL — ABNORMAL HIGH (ref 70–99)
Glucose-Capillary: 140 mg/dL — ABNORMAL HIGH (ref 70–99)

## 2023-05-16 LAB — CBC
HCT: 23.9 % — ABNORMAL LOW (ref 36.0–46.0)
Hemoglobin: 7.6 g/dL — ABNORMAL LOW (ref 12.0–15.0)
MCH: 27.9 pg (ref 26.0–34.0)
MCHC: 31.8 g/dL (ref 30.0–36.0)
MCV: 87.9 fL (ref 80.0–100.0)
Platelets: 112 10*3/uL — ABNORMAL LOW (ref 150–400)
RBC: 2.72 MIL/uL — ABNORMAL LOW (ref 3.87–5.11)
RDW: 15.9 % — ABNORMAL HIGH (ref 11.5–15.5)
WBC: 3.5 10*3/uL — ABNORMAL LOW (ref 4.0–10.5)
nRBC: 0 % (ref 0.0–0.2)

## 2023-05-16 LAB — PROTIME-INR
INR: 1.8 — ABNORMAL HIGH (ref 0.8–1.2)
Prothrombin Time: 21.1 s — ABNORMAL HIGH (ref 11.4–15.2)

## 2023-05-16 MED ORDER — GABAPENTIN 300 MG PO CAPS
300.0000 mg | ORAL_CAPSULE | Freq: Three times a day (TID) | ORAL | Status: DC
  Administered 2023-05-16 – 2023-05-18 (×9): 300 mg via ORAL
  Filled 2023-05-16 (×9): qty 1

## 2023-05-16 MED ORDER — WARFARIN SODIUM 2.5 MG PO TABS
2.5000 mg | ORAL_TABLET | Freq: Every day | ORAL | Status: DC
  Administered 2023-05-16: 2.5 mg via ORAL
  Filled 2023-05-16: qty 1

## 2023-05-16 MED FILL — Lidocaine HCl Local Preservative Free (PF) Inj 2%: INTRAMUSCULAR | Qty: 14 | Status: AC

## 2023-05-16 MED FILL — Heparin Sodium (Porcine) Inj 1000 Unit/ML: INTRAMUSCULAR | Qty: 2500 | Status: AC

## 2023-05-16 MED FILL — Cefazolin Sodium-Dextrose IV Solution 2 GM/100ML-4%: INTRAVENOUS | Qty: 100 | Status: AC

## 2023-05-16 MED FILL — Heparin Sodium (Porcine) Inj 1000 Unit/ML: Qty: 1000 | Status: AC

## 2023-05-16 MED FILL — Potassium Chloride Inj 2 mEq/ML: INTRAVENOUS | Qty: 40 | Status: AC

## 2023-05-16 NOTE — Progress Notes (Signed)
      [address]       [place] [address]             [phone]      5 Days Post-Op Procedure(s) (LRB): MITRAL VALVE (MV) REPLACEMENT USING MOSAIC VALVE SIZE (N/A) TRANSESOPHAGEAL ECHOCARDIOGRAM (N/A) Subjective: Resting in bed, sleepy, slow to awaken.  Says she is having incisional pain but doesn't like to use th pain medications because they make her itch. Otherwise no new concerns. O2 at 3L/[place]  Objective: Vital signs in last 24 hours: Temp:  [97.5 F (36.4 C)-98.7 F (37.1 C)] 98.6 F (37 C) (12/18 0313) Pulse Rate:  [62-82] 67 (12/18 0700) Cardiac Rhythm: Normal sinus rhythm (12/18 0313) Resp:  [13-20] 17 (12/18 0700) BP: (89-147)/(41-93) 102/46 (12/18 0700) SpO2:  [92 %-99 %] 96 % (12/18 0700) Weight:  [86.8 kg] 86.8 kg (12/18 0155)     Intake/Output from previous day: 12/17 0701 - 12/18 0700 In: 840 [P.O.:840] Out: 1400 [Urine:1400] Intake/Output this shift: No intake/output data recorded.  General appearance: alert, cooperative, and mild distress Neurologic: intact Heart: RRR, NSR on monitor Lungs: clear breath sounds Abdomen: soft, no tenderness Extremities: no peripheral edema Wound: the sternotomy incision is intact and dry  Lab Results: Recent Labs    05/15/23 0530 05/16/23 0311  WBC 4.3 3.5*  HGB 7.7* 7.6*  HCT 23.9* 23.9*  PLT 101* 112*   BMET:  Recent Labs    05/15/23 0530 05/16/23 0311  NA 135 139  K 3.7 4.2  CL 101 101  CO2 28 26  GLUCOSE 101* 115*  BUN 24* 24*  CREATININE 1.15* 1.11*  CALCIUM 7.4* 7.9*    PT/INR:  Recent Labs    05/16/23 0311  LABPROT 21.1*  INR 1.8*   CBG (last 3)  Recent Labs    05/15/23 1146 05/15/23 1559 05/15/23 2146  GLUCAP 156* 161* 129*          Assessment/Plan: S/P Procedure(s) (LRB): MITRAL VALVE (MV) REPLACEMENT USING MOSAIC VALVE SIZE (N/A) TRANSESOPHAGEAL ECHOCARDIOGRAM (N/A)   -POD 5 MV replacement with a 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 SR  with first degree HB, on prophylactic amiodarone. On Midodrine 10mg  TID with stable BP. Pacer wires are out and Coumadin anticoagulation started  dosed at @2 .5mg  x 3, INR 1.8. Repeat 2.5mg  this PM.    -Expected acute blood loss anemia- HCT stable at 23.9% after transfusion on 12/16. On iron replacement.    -ENDO-H/O type 2 DM treated with metformin PTA, A1C 5.8 on admission. CBGs well controlled past 24 hours. Continue SSI.   -PULM- h/o tobacco abuse, COPD. On multiple inhalers that have been restarted. Has nicotine patch. Small bilateral effusions with ATX persist on CXR this morning. Encouraging mobility and pulm hygiene.    -History of major depression and anxiety- Zoloft and Klonopin resumed. Her gabapentin has also been resumed.  She is overly sedated today. Will decrease the gabapentin.    -Renal- Normal function at baseline with mild insufficiency post-op. Creat trending down. Wt is ~3.5kg positive, continue daily Lasix.     -Disposition- transfer to 4E,  PT / OT evaluations appreciated. She is planning to discharge to an apartment where she has been living with her sister. Arranging for [facility] PT and CSW along with rollator and BSC.        LOS: 5 days    Leary Roca, PA-C 05/16/2023

## 2023-05-16 NOTE — Progress Notes (Addendum)
Pt received in 4E12 by this RN.  Telemetry applied, CCMD notified. Pt oriented to room, call bell in reach.  Lunch tray at bedside, pt eating and without any other immediate needs at this time. Home DME at bedside (rollator and 3-in-1 bedside commode).  Pt states that she lives at home with her sister but is in the process of being evicted, must be out of her apt by early January.  Doesn't yet know where she will go from there.

## 2023-05-16 NOTE — Plan of Care (Signed)
  Problem: Education: Goal: Knowledge of General Education information will improve Description: Including pain rating scale, medication(s)/side effects and non-pharmacologic comfort measures Outcome: Progressing   Problem: Health Behavior/Discharge Planning: Goal: Ability to manage health-related needs will improve Outcome: Progressing   Problem: Clinical Measurements: Goal: Ability to maintain clinical measurements within normal limits will improve Outcome: Progressing Goal: Will remain free from infection Outcome: Progressing Goal: Diagnostic test results will improve Outcome: Progressing Goal: Respiratory complications will improve Outcome: Progressing Goal: Cardiovascular complication will be avoided Outcome: Progressing   Problem: Activity: Goal: Risk for activity intolerance will decrease Outcome: Progressing   Problem: Nutrition: Goal: Adequate nutrition will be maintained Outcome: Progressing   Problem: Coping: Goal: Level of anxiety will decrease Outcome: Progressing   Problem: Elimination: Goal: Will not experience complications related to bowel motility Outcome: Progressing Goal: Will not experience complications related to urinary retention Outcome: Progressing   Problem: Pain Management: Goal: General experience of comfort will improve Outcome: Progressing   Problem: Safety: Goal: Ability to remain free from injury will improve Outcome: Progressing   Problem: Skin Integrity: Goal: Risk for impaired skin integrity will decrease Outcome: Progressing   Problem: Education: Goal: Will demonstrate proper wound care and an understanding of methods to prevent future damage Outcome: Progressing Goal: Knowledge of disease or condition will improve Outcome: Progressing Goal: Knowledge of the prescribed therapeutic regimen will improve Outcome: Progressing Goal: Individualized Educational Video(s) Outcome: Progressing   Problem: Activity: Goal: Risk for  activity intolerance will decrease Outcome: Progressing   Problem: Cardiac: Goal: Will achieve and/or maintain hemodynamic stability Outcome: Progressing   Problem: Clinical Measurements: Goal: Postoperative complications will be avoided or minimized Outcome: Progressing   Problem: Respiratory: Goal: Respiratory status will improve Outcome: Progressing   Problem: Skin Integrity: Goal: Wound healing without signs and symptoms of infection Outcome: Progressing Goal: Risk for impaired skin integrity will decrease Outcome: Progressing   Problem: Urinary Elimination: Goal: Ability to achieve and maintain adequate renal perfusion and functioning will improve Outcome: Progressing

## 2023-05-16 NOTE — Progress Notes (Signed)
Occupational Therapy Treatment Patient Details Name: Rose [name] MRN: [id number] DOB: 18-May-1956 Today's Date: 05/16/2023   History of present illness 67 yo F admitted 12/13 for planned MVR.  PMH: rheumatic mitral stenosis, mitral regurgitation, severe tricuspid regurgitation, CAD, COPD, ADD, depression   OT comments  Pt with slow progress toward established OT goals this session secondary to refusing mobility. OT initiated session to optimize education and for pain management as pt reporting 10/10 pain. RN aware. Engaged in BUE HEP, IS, and flutter valve as well as review of precautions. Pt needing mod cues for recall of all precautions. Pt unhappy about pain and people encouraging her to mobilize; max education regarding expectation of pain and role of mobility in recovery. Will continue to follow.        If plan is discharge home, recommend the following:  A little help with walking and/or transfers;A little help with bathing/dressing/bathroom;A lot of help with bathing/dressing/bathroom;Assistance with cooking/housework;Assist for transportation;Help with stairs or ramp for entrance   Equipment Recommendations  BSC/3in1;Other (comment) (rollator)    Recommendations for Other Services      Precautions / Restrictions Precautions Precautions: Sternal;Fall Precaution Booklet Issued: No Precaution Comments: watch SPO2 Restrictions Weight Bearing Restrictions Per Provider Order: Yes RUE Weight Bearing Per Provider Order: Non weight bearing LUE Weight Bearing Per Provider Order: Non weight bearing       Mobility Bed Mobility                    Transfers                         Balance                                           ADL either performed or assessed with clinical judgement   ADL Overall ADL's : Needs assistance/impaired     Grooming: Set up;Bed level                                 General ADL Comments: Pt  refusing mobility reporting signitficant pain in her R ribs. RN aware. see below for session details    Extremity/Trunk Assessment Upper Extremity Assessment Upper Extremity Assessment: Generalized weakness            Vision       Perception     Praxis      Cognition Arousal: Alert Behavior During Therapy: WFL for tasks assessed/performed Overall Cognitive Status: Impaired/Different from baseline Area of Impairment: Memory, Safety/judgement                     Memory: Decreased recall of precautions   Safety/Judgement: Decreased awareness of safety, Decreased awareness of deficits     General Comments: pt able to state 2/4 precautions and educated for all        Exercises Exercises: Other exercises Other Exercises Other Exercises: BUE forward punches with cues to keep elbows in 20x ea UE Other Exercises: Pt reporting unable to cough. Provided flutter valve and instructed on use. Performed 5x start of session and 5x end of session resulting in cough. Encouraging pt to attempt expiration into valve for longer for better results Other Exercises: use of IS 5x and pt needing max cues for optimal technue continues with  consistently low volumre Other Exercises: max education regarding precautions and mod cues for pt carryover. Reviewed living situation with pt as per chart will have to sleep on floor at home; pt reports there is a bed she can sleep in. Highly discouraged any floor transfers. pt verbalized understanding. Encouraged pt to walk later with nursing    Shoulder Instructions       General Comments VSS    Pertinent Vitals/ Pain       Pain Assessment Pain Assessment: 0-10 Pain Score: 10-Worst pain ever Pain Location: R ribs Pain Descriptors / Indicators: Sharp Pain Intervention(s): Limited activity within patient's tolerance, Monitored during session  Home Living                                          Prior  Functioning/Environment              Frequency  Min 1X/week        Progress Toward Goals  OT Goals(current goals can now be found in the care plan section)  Progress towards OT goals: Progressing toward goals (slowly, refused mobility today, focused session on education)  Acute Rehab OT Goals OT Goal Formulation: With patient Time For Goal Achievement: 05/28/23 Potential to Achieve Goals: Good ADL Goals Pt Will Perform Upper Body Dressing: with set-up;sitting Pt Will Perform Lower Body Dressing: sit to/from stand;with contact guard assist Pt Will Transfer to Toilet: ambulating;with supervision Pt Will Perform Toileting - Clothing Manipulation and hygiene: with supervision;sit to/from stand Additional ADL Goal #1: Pt will perform 8+ mins OOB ADL within sternal precautions  Plan      Co-evaluation                 AM-PAC OT "6 Clicks" Daily Activity     Outcome Measure   Help from another person eating meals?: None Help from another person taking care of personal grooming?: A Little Help from another person toileting, which includes using toliet, bedpan, or urinal?: A Little Help from another person bathing (including washing, rinsing, drying)?: A Lot Help from another person to put on and taking off regular upper body clothing?: A Lot Help from another person to put on and taking off regular lower body clothing?: A Lot 6 Click Score: 16    End of Session Equipment Utilized During Treatment: Oxygen  OT Visit Diagnosis: Unsteadiness on feet (R26.81);Muscle weakness (generalized) (M62.81);Pain Pain - Right/Left: Right Pain - part of body:  (ribs)   Activity Tolerance Patient tolerated treatment well   Patient Left in chair;with call bell/phone within reach;with chair alarm set   Nurse Communication Mobility status        Time: 4132-4401 OT Time Calculation (min): 28 min  Charges: OT General Charges $OT Visit: 1 Visit OT Treatments $Therapeutic  Activity: 23-37 mins  Tyler Deis, OTR/L [facility] Acute Rehabilitation Office: [phone]   Myrla Halsted 05/16/2023, 5:27 PM

## 2023-05-17 ENCOUNTER — Inpatient Hospital Stay (HOSPITAL_COMMUNITY): Payer: 59

## 2023-05-17 LAB — CBC
HCT: 26.2 % — ABNORMAL LOW (ref 36.0–46.0)
Hemoglobin: 8.3 g/dL — ABNORMAL LOW (ref 12.0–15.0)
MCH: 28.4 pg (ref 26.0–34.0)
MCHC: 31.7 g/dL (ref 30.0–36.0)
MCV: 89.7 fL (ref 80.0–100.0)
Platelets: 140 10*3/uL — ABNORMAL LOW (ref 150–400)
RBC: 2.92 MIL/uL — ABNORMAL LOW (ref 3.87–5.11)
RDW: 16.1 % — ABNORMAL HIGH (ref 11.5–15.5)
WBC: 3.2 10*3/uL — ABNORMAL LOW (ref 4.0–10.5)
nRBC: 0 % (ref 0.0–0.2)

## 2023-05-17 LAB — BASIC METABOLIC PANEL
Anion gap: 8 (ref 5–15)
BUN: 21 mg/dL (ref 8–23)
CO2: 31 mmol/L (ref 22–32)
Calcium: 8.2 mg/dL — ABNORMAL LOW (ref 8.9–10.3)
Chloride: 100 mmol/L (ref 98–111)
Creatinine, Ser: 1.29 mg/dL — ABNORMAL HIGH (ref 0.44–1.00)
GFR, Estimated: 46 mL/min — ABNORMAL LOW (ref >60)
Glucose, Bld: 101 mg/dL — ABNORMAL HIGH (ref 70–99)
Potassium: 3.8 mmol/L (ref 3.5–5.1)
Sodium: 139 mmol/L (ref 135–145)

## 2023-05-17 LAB — PROTIME-INR
INR: 2.1 — ABNORMAL HIGH (ref 0.8–1.2)
Prothrombin Time: 24.2 s — ABNORMAL HIGH (ref 11.4–15.2)

## 2023-05-17 LAB — GLUCOSE, CAPILLARY
Glucose-Capillary: 103 mg/dL — ABNORMAL HIGH (ref 70–99)
Glucose-Capillary: 111 mg/dL — ABNORMAL HIGH (ref 70–99)
Glucose-Capillary: 231 mg/dL — ABNORMAL HIGH (ref 70–99)
Glucose-Capillary: 99 mg/dL (ref 70–99)

## 2023-05-17 MED ORDER — POTASSIUM CHLORIDE CRYS ER 20 MEQ PO TBCR
20.0000 meq | EXTENDED_RELEASE_TABLET | Freq: Two times a day (BID) | ORAL | Status: AC
  Administered 2023-05-17 (×2): 20 meq via ORAL
  Filled 2023-05-17 (×2): qty 1

## 2023-05-17 MED ORDER — INSULIN ASPART 100 UNIT/ML IJ SOLN
0.0000 [IU] | Freq: Three times a day (TID) | INTRAMUSCULAR | Status: DC
  Administered 2023-05-17: 5 [IU] via SUBCUTANEOUS
  Administered 2023-05-18: 3 [IU] via SUBCUTANEOUS
  Administered 2023-05-19: 5 [IU] via SUBCUTANEOUS
  Administered 2023-05-19: 2 [IU] via SUBCUTANEOUS
  Administered 2023-05-20: 3 [IU] via SUBCUTANEOUS
  Administered 2023-05-20: 2 [IU] via SUBCUTANEOUS
  Administered 2023-05-20: 3 [IU] via SUBCUTANEOUS
  Administered 2023-05-21 – 2023-05-22 (×3): 2 [IU] via SUBCUTANEOUS

## 2023-05-17 MED ORDER — POTASSIUM CHLORIDE CRYS ER 20 MEQ PO TBCR
20.0000 meq | EXTENDED_RELEASE_TABLET | Freq: Every day | ORAL | Status: DC
  Administered 2023-05-18 – 2023-05-19 (×2): 20 meq via ORAL
  Filled 2023-05-17 (×3): qty 1

## 2023-05-17 MED ORDER — WARFARIN SODIUM 2 MG PO TABS
2.0000 mg | ORAL_TABLET | Freq: Every day | ORAL | Status: DC
  Administered 2023-05-17 – 2023-05-21 (×5): 2 mg via ORAL
  Filled 2023-05-17 (×5): qty 1

## 2023-05-17 MED FILL — Calcium Chloride Inj 10%: INTRAVENOUS | Qty: 10 | Status: AC

## 2023-05-17 MED FILL — Mannitol IV Soln 20%: INTRAVENOUS | Qty: 500 | Status: AC

## 2023-05-17 MED FILL — Electrolyte-R (PH 7.4) Solution: INTRAVENOUS | Qty: 3000 | Status: AC

## 2023-05-17 MED FILL — Heparin Sodium (Porcine) Inj 1000 Unit/ML: INTRAMUSCULAR | Qty: 10 | Status: AC

## 2023-05-17 MED FILL — Sodium Chloride IV Soln 0.9%: INTRAVENOUS | Qty: 2000 | Status: AC

## 2023-05-17 MED FILL — Sodium Bicarbonate IV Soln 8.4%: INTRAVENOUS | Qty: 50 | Status: AC

## 2023-05-17 NOTE — Plan of Care (Signed)
  Problem: Education: Goal: Knowledge of General Education information will improve Description: Including pain rating scale, medication(s)/side effects and non-pharmacologic comfort measures Outcome: Progressing   Problem: Nutrition: Goal: Adequate nutrition will be maintained Outcome: Progressing   Problem: Health Behavior/Discharge Planning: Goal: Ability to manage health-related needs will improve Outcome: Not Progressing   Problem: Coping: Goal: Level of anxiety will decrease Outcome: Not Progressing   Problem: Pain Management: Goal: General experience of comfort will improve Outcome: Not Progressing   Problem: Respiratory: Goal: Respiratory status will improve Outcome: Not Progressing

## 2023-05-17 NOTE — Progress Notes (Addendum)
CARDIAC REHAB PHASE I   PRE:  Rate/Rhythm: 75 NSR  BP:  Sitting: 131/59      SpO2: 94 4L  MODE:  Ambulation: 250 ft    POST:  Rate/Rhythm: 84 NSR  BP:  Sitting: 119/53      SpO2: 94 4L  Pt agreeable to ambulation, stood with min assist at bed. Initially dizzy after standing causing her to step backward but quickly regain her balance. Pt ambulated in the hallway with CGA using gait belt and rollator. Pt able to ambulate the hallway w/o rest break (SpO2 >94%) and returned to room. Lunch ordered and bed sheets changed. Pt left with call button in hand.  Faustino Congress  MS, ACSM-CEP 12:50 PM 05/17/2023    Service time is from 1200 to 1250.

## 2023-05-17 NOTE — Progress Notes (Signed)
Physical Therapy Treatment Patient Details Name: Rose [name] MRN: [id number] DOB: Jan 07, 1956 Today's Date: 05/17/2023   History of Present Illness 67 yo F admitted 12/13 for planned MVR.  PMH: rheumatic mitral stenosis, mitral regurgitation, severe tricuspid regurgitation, CAD, COPD, ADD, depression    PT Comments  Pt is slow progressing with goals. Currently pt requires supervision for sit to stand and gait and Min A for bed mobility. Pt is limited by cognition remembering precautions requiring cueing occasional throughout in order to protect the integrity of surgical site.  Pt states that she feels unsafe with rollator and would like a RW because she feels more stable. Due to pt current functional status, home set up and available assistance at home recommending skilled physical therapy services 3x/week in order to address strength, balance and functional mobility to decrease risk for falls, injury and re-hospitalization.      If plan is discharge home, recommend the following: A little help with walking and/or transfers;A little help with bathing/dressing/bathroom;Assist for transportation     Equipment Recommendations  BSC/3in1;Rolling walker (2 wheels)       Precautions / Restrictions Precautions Precautions: Sternal;Fall Precaution Booklet Issued: No Restrictions Weight Bearing Restrictions Per Provider Order: Yes RUE Weight Bearing Per Provider Order: Non weight bearing LUE Weight Bearing Per Provider Order: Non weight bearing     Mobility  Bed Mobility Overal bed mobility: Needs Assistance Bed Mobility: Supine to Sit, Sit to Supine     Supine to sit: Min assist, HOB elevated Sit to supine: Supervision   General bed mobility comments: HOB ~40 degrees, Min A to elevate trunk to mid line. Verbal cues to maintain sternal precautions    Transfers Overall transfer level: Needs assistance Equipment used: Rollator (4 wheels) Transfers: Sit to/from Stand, Bed to  chair/wheelchair/BSC Sit to Stand: Supervision           General transfer comment: supervision for hand placement to maintain sternal precautions    Ambulation/Gait Ambulation/Gait assistance: Modified independent (Device/Increase time) Gait Distance (Feet): 350 Feet Assistive device: Rollator (4 wheels) Gait Pattern/deviations: Step-through pattern, Decreased stride length Gait velocity: decreased Gait velocity interpretation: <1.8 ft/sec, indicate of risk for recurrent falls   General Gait Details: Pt on 3 L o2 via [place] with gait. HR and O2 sats remained WNL      Balance Overall balance assessment: Mild deficits observed, not formally tested        Cognition Arousal: Alert Behavior During Therapy: WFL for tasks assessed/performed Overall Cognitive Status: Within Functional Limits for tasks assessed         Memory: Decreased recall of precautions   Safety/Judgement: Decreased awareness of safety, Decreased awareness of deficits                 General Comments General comments (skin integrity, edema, etc.): No significant skin issues. Pt was able to stand bent over for peri care without LOB.      Pertinent Vitals/Pain Pain Assessment Faces Pain Scale: Hurts a little bit Pain Location: surgical site Pain Descriptors / Indicators: Grimacing Pain Intervention(s): Monitored during session     PT Goals (current goals can now be found in the care plan section) Acute Rehab PT Goals Patient Stated Goal: "I just want to go home and see my dog" PT Goal Formulation: With patient Time For Goal Achievement: 05/28/23 Potential to Achieve Goals: Good Progress towards PT goals: Progressing toward goals    Frequency    Min 1X/week      PT Plan  Continue with current POC       AM-PAC PT "6 Clicks" Mobility   Outcome Measure  Help needed turning from your back to your side while in a flat bed without using bedrails?: A Little Help needed moving from lying  on your back to sitting on the side of a flat bed without using bedrails?: A Little Help needed moving to and from a bed to a chair (including a wheelchair)?: A Little Help needed standing up from a chair using your arms (e.g., wheelchair or bedside chair)?: A Little Help needed to walk in hospital room?: A Little Help needed climbing 3-5 steps with a railing? : A Little 6 Click Score: 18    End of Session Equipment Utilized During Treatment: Oxygen;Gait belt Activity Tolerance: Patient tolerated treatment well Patient left: with call bell/phone within reach;in bed Nurse Communication: Mobility status PT Visit Diagnosis: Difficulty in walking, not elsewhere classified (R26.2);Other abnormalities of gait and mobility (R26.89)     Time: 4696-2952 PT Time Calculation (min) (ACUTE ONLY): 30 min  Charges:    $Therapeutic Activity: 23-37 mins PT General Charges $$ ACUTE PT VISIT: 1 Visit                     Harrel Carina, DPT, CLT  Acute Rehabilitation Services Office: [phone] (Secure chat preferred)\   [name] 05/17/2023, 4:45 PM

## 2023-05-17 NOTE — Progress Notes (Addendum)
      [address]       [place] [address]             [phone]        6 Days Post-Op Procedure(s) (LRB): MITRAL VALVE (MV) REPLACEMENT USING MOSAIC VALVE SIZE (N/A) TRANSESOPHAGEAL ECHOCARDIOGRAM (N/A)  Subjective: Patient is sleepy but arousable. She states "I have to pee" and "I am having a lot in sternal, incisional pain"  Objective: Vital signs in last 24 hours: Temp:  [98.2 F (36.8 C)-99.1 F (37.3 C)] 99.1 F (37.3 C) (12/18 1711) Pulse Rate:  [66-80] 80 (12/18 1711) Cardiac Rhythm: Normal sinus rhythm;Heart block (12/18 1900) Resp:  [17-21] 20 (12/18 1711) BP: (104-127)/(49-60) 107/50 (12/18 1711) SpO2:  [94 %-98 %] 98 % (12/18 1926)  Pre op weight 83 kg Current Weight  05/16/23 86.8 kg      Intake/Output from previous day: 12/18 0701 - 12/19 0700 In: 240 [P.O.:240] Out: -    Physical Exam:  Cardiovascular: RRR, no murmur Pulmonary: Diminished bibasilar breath sounds Abdomen: Soft, non tender, bowel sounds present. Extremities: Mild bilateral lower extremity edema. Wound: Clean and dry.  No erythema or signs of infection.  Lab Results: CBC: Recent Labs    05/16/23 0311 05/17/23 0317  WBC 3.5* 3.2*  HGB 7.6* 8.3*  HCT 23.9* 26.2*  PLT 112* 140*   BMET:  Recent Labs    05/16/23 0311 05/17/23 0317  NA 139 139  K 4.2 3.8  CL 101 100  CO2 26 31  GLUCOSE 115* 101*  BUN 24* 21  CREATININE 1.11* 1.29*  CALCIUM 7.9* 8.2*    PT/INR:  Lab Results  Component Value Date   INR 2.1 (H) 05/17/2023   INR 1.8 (H) 05/16/2023   INR 1.6 (H) 05/15/2023   ABG:  INR: Will add last result for INR, ABG once components are confirmed Will add last 4 CBG results once components are confirmed  Assessment/Plan:  1. CV - SR. She was on Amiodarone 400 mg bid for 5 days (for a fib prophylaxis abut this has finished). On Midodrine 10 mg tid and Coumadin. INR this am increased from 1.8 to 2.1. Will give 2 mg of Coumadin this  evening. 2.  Pulmonary - History of COPD. Continue Pulmicort and Brovana. On 3 liters via oxygen. Wean as able. Will check PA/LAT CXR. Encourage incentive spirometer. 3. Above pre op weight, requires further diuresis-continue with daily Lasix 40 mg 4.  Expected post op acute blood loss anemia - H and H this am stable at 8.3 and 26.2. Continue Trigels. 5. DM-CBGs 119/140/103. Pre op HGA1C 5.8. Will restart Metformin at discharge 6. Supplement potassium 7. Creatinine slightly increased from 1.11 to 1.29. Creatinine prior to surgery 1.17. Elevation may be related to diuresis. Re check in am 8. Mild thrombocytopenia-platelets this am up to 140,000 9. Regarding pain control, per yesterday's note, she does not like to use pain medications because of itching. Will give Benadryl PRN to help 10. History of major depression and anxiety-continue Zoloft and Klonopin. 10. Deconditioned-continue PT/OT. BSC and 3 in 1 ordered. Regarding disposition, patient in process of being evicted from apartment (? End of January). Will discuss with patient what arrangements will be in place in next week  Wyat Infinger M ZimmermanPA-C 7:59 AM

## 2023-05-17 NOTE — Plan of Care (Signed)
  Problem: Pain Management: Goal: General experience of comfort will improve Outcome: Progressing   Problem: Safety: Goal: Ability to remain free from injury will improve Outcome: Progressing   Problem: Skin Integrity: Goal: Risk for impaired skin integrity will decrease Outcome: Progressing

## 2023-05-18 LAB — GLUCOSE, CAPILLARY
Glucose-Capillary: 92 mg/dL (ref 70–99)
Glucose-Capillary: 151 mg/dL — ABNORMAL HIGH (ref 70–99)
Glucose-Capillary: 118 mg/dL — ABNORMAL HIGH (ref 70–99)
Glucose-Capillary: 191 mg/dL — ABNORMAL HIGH (ref 70–99)

## 2023-05-18 LAB — BASIC METABOLIC PANEL
Anion gap: 10 (ref 5–15)
BUN: 17 mg/dL (ref 8–23)
CO2: 28 mmol/L (ref 22–32)
Calcium: 8.5 mg/dL — ABNORMAL LOW (ref 8.9–10.3)
Chloride: 102 mmol/L (ref 98–111)
Creatinine, Ser: 1.14 mg/dL — ABNORMAL HIGH (ref 0.44–1.00)
GFR, Estimated: 53 mL/min — ABNORMAL LOW (ref >60)
Glucose, Bld: 96 mg/dL (ref 70–99)
Potassium: 4.3 mmol/L (ref 3.5–5.1)
Sodium: 140 mmol/L (ref 135–145)

## 2023-05-18 LAB — PROTIME-INR
INR: 2.3 — ABNORMAL HIGH (ref 0.8–1.2)
Prothrombin Time: 25.5 s — ABNORMAL HIGH (ref 11.4–15.2)

## 2023-05-18 MED ORDER — MAGIC MOUTHWASH
10.0000 mL | Freq: Three times a day (TID) | ORAL | Status: DC | PRN
  Administered 2023-05-18 – 2023-05-20 (×4): 10 mL via ORAL
  Filled 2023-05-18 (×6): qty 10

## 2023-05-18 NOTE — Progress Notes (Signed)
[address]       [place] [address]             [phone]      7 Days Post-Op Procedure(s) (LRB): MITRAL VALVE (MV) REPLACEMENT USING MOSAIC VALVE SIZE (N/A) TRANSESOPHAGEAL ECHOCARDIOGRAM (N/A) Subjective:  Awake and alert, complains of pain in her right chest laterally and complains of sore mouth. Said she is allergic to the "diet food" we have ordered for her. Walked 233ft with the rehab team yesterday.   O2 at 3L/[place]  Objective: Vital signs in last 24 hours: Temp:  [98.1 F (36.7 C)-98.8 F (37.1 C)] 98.4 F (36.9 C) (12/20 0736) Pulse Rate:  [66-82] 80 (12/20 0736) Cardiac Rhythm: Heart block (12/20 0906) Resp:  [15-18] 18 (12/20 0736) BP: (93-131)/(44-59) 116/54 (12/20 0736) SpO2:  [93 %-94 %] 93 % (12/20 0736) Weight:  [85.4 kg] 85.4 kg (12/20 0642)     Intake/Output from previous day: 12/19 0701 - 12/20 0700 In: 483 [P.O.:480; I.V.:3] Out: 1800 [Urine:1800] Intake/Output this shift: Total I/O In: 3 [I.V.:3] Out: 300 [Urine:300]  General appearance: alert, cooperative, and mild distress Neurologic: intact Heart: RRR, NSR on monitor Lungs: clear breath sounds Abdomen: soft, no tenderness Extremities: no peripheral edema Wound: the sternotomy incision is intact and dry, the sternum is stable. She has tenderness to firm pressure over the right side of her chest.   Lab Results: Recent Labs    05/16/23 0311 05/17/23 0317  WBC 3.5* 3.2*  HGB 7.6* 8.3*  HCT 23.9* 26.2*  PLT 112* 140*   BMET:  Recent Labs    05/17/23 0317 05/18/23 0352  NA 139 140  K 3.8 4.3  CL 100 102  CO2 31 28  GLUCOSE 101* 96  BUN 21 17  CREATININE 1.29* 1.14*  CALCIUM 8.2* 8.5*    PT/INR:  Recent Labs    05/18/23 0352  LABPROT 25.5*  INR 2.3*   CBG (last 3)  Recent Labs    05/17/23 1636 05/17/23 2156 05/18/23 0636  GLUCAP 231* 99 92          Assessment/Plan: S/P Procedure(s) (LRB): MITRAL VALVE (MV) REPLACEMENT USING  MOSAIC VALVE SIZE (N/A) TRANSESOPHAGEAL ECHOCARDIOGRAM (N/A)   -POD 7 MV replacement with a bioprosthetic valve for rheumatic mitral stenosis and regurgitation. Maintaining SR, now off prophylactic amiodarone. On Midodrine 10mg  TID with BP 90-120 systolic. Pacer wires are out and Coumadin anticoagulation started  dosed at @2 .5mg  x 4 days and 2mg  x 1 day.  INR 2.3. continue dosing at 2mg  daily.     -Expected acute blood loss anemia- HCT trending up.  On iron replacement.    -ENDO-H/O type 2 DM treated with metformin PTA, A1C 5.8 on admission. CBGs well controlled past 24 hours. Continue SSI.   -PULM- h/o tobacco abuse, COPD. On multiple inhalers that have been restarted. Has nicotine patch. Small bilateral effusions with ATX persist on CXR yesterday. Continue daily Lasix. Encouraging mobility and pulm hygiene. Still requiring O2 at 3L [place]. Will do walk test today--may need home O2 therapy.    -History of major depression and anxiety- Zoloft and Klonopin resumed. Her gabapentin has also been resumed but is at a lower dose due to over-sedation.    -Renal- Normal function at baseline with mild insufficiency post-op. Creat trending down. Wt is ~2kg positive, continue daily Lasix.     -Disposition- She told us earlier in the week that she was planning to discharge to an  apartment where she has been living with her sister but it is unclear if this arrangement is available to her.  TOC team consulted for possible transition to SNF. We have also requested HH PT, a roling walker, and BSC if she is dischared to her sister's apartment.        LOS: 7 days    Leary Roca, PA-C 05/18/2023

## 2023-05-18 NOTE — TOC Progression Note (Signed)
Transition of Care (TOC) - Progression Note  Donn Pierini RN, BSN Transitions of Care Unit 4E- RN Case Manager See Treatment Team for direct phone #   Patient Details  Name: Rose [name] MRN: [id number] Date of Birth: 18-Dec-1955  Transition of Care [facility]) CM/SW Contact  Zenda Alpers Lenn Sink, RN Phone Number: 05/18/2023, 1:46 PM  Clinical Narrative:    Noted order placed for Home 02,  [employer] has been assisting with DME per previous Cm note.  Cm reached out to [facility] w/ [employer] for Home 02 referral- [employer] to follow for home 02 needs, portable 02 to be delivered to room for transport home.  Pt also requesting that rollator that was previously delivered be changed out to RW- liaison to also take care of this request prior to discharge.   [facility] following for [facility] needs w/ office protocol- RN/PT/OT/SW. Liaison Heather updated on EDD.    Expected Discharge Plan: Home w Home Health Services Barriers to Discharge: Continued Medical Work up  Expected Discharge Plan and Services In-house Referral: Clinical Social Work Discharge Planning Services: CM Consult Post Acute Care Choice: Home Health Living arrangements for the past 2 months: Apartment                 DME Arranged: Oxygen, Walker rolling DME Agency: [employer] Date DME Agency Contacted: 05/18/23 Time DME Agency Contacted: 1346 Representative spoke with at DME Agency: Vaughan Basta HH Arranged: Social Work, PT HH Agency: [facility] ([facility] Date HH Agency Contacted: 05/15/23 Time HH Agency Contacted: 1451 Representative spoke with at [facility] Agency: [facility]   Social Determinants of Health (SDOH) Interventions SDOH Screenings   Food Insecurity: No Food Insecurity (05/13/2023)  Housing: High Risk (05/11/2023)  Transportation Needs: No Transportation Needs (05/11/2023)  Utilities: At Risk (05/11/2023)  Alcohol Screen: Low Risk  (12/13/2018)  Financial Resource Strain: Medium Risk  (01/25/2023)  Social Connections: Unknown (05/17/2022)   Received from [facility], [facility]  Tobacco Use: High Risk (05/11/2023)    Readmission Risk Interventions     No data to display

## 2023-05-18 NOTE — Plan of Care (Signed)
  Problem: Elimination: Goal: Will not experience complications related to bowel motility Outcome: Progressing Goal: Will not experience complications related to urinary retention Outcome: Progressing   Problem: Pain Management: Goal: General experience of comfort will improve Outcome: Progressing   Problem: Safety: Goal: Ability to remain free from injury will improve Outcome: Progressing   Problem: Skin Integrity: Goal: Risk for impaired skin integrity will decrease Outcome: Progressing

## 2023-05-18 NOTE — Progress Notes (Addendum)
SATURATION QUALIFICATIONS: (This note is used to comply with regulatory documentation for home oxygen)   Patient Saturations on Room Air at Rest = 87%   Patient Saturations on Room Air while Ambulating = %   Patient Saturations on 02 Liters of oxygen while Ambulating = 94%   Pt had significant decrease in SPO2 at rest w/o oxygen. 2L used to helped maintain SpO2 >90%. More detailed note in progress note.

## 2023-05-18 NOTE — Progress Notes (Signed)
CARDIAC REHAB PHASE I   PRE:  Rate/Rhythm: 83 SR  BP:  Sitting: 121/52      SpO2: 2L 92-94%    RA 87% with pursed lip breathing   MODE:  Ambulation: 300 ft    POST:  Rate/Rhythm: 86 SR  BP:  Sitting: 97/56      SpO2: 2L 94-98  Pt agreeable to mobilize in hallway, pt stood w/o assistance with RW and standby assistance. Pt ambulated without rest break at slow and steady pace in the hallway. Pt returned to chair while sheets were changed and then to bed with call button in hand.  Faustino Congress  MS, ACSM-CEP 11:17 AM 05/18/2023    Service time is from 1030 to 1117.

## 2023-05-19 LAB — PROTIME-INR
INR: 2.6 — ABNORMAL HIGH (ref 0.8–1.2)
Prothrombin Time: 27.9 s — ABNORMAL HIGH (ref 11.4–15.2)

## 2023-05-19 LAB — GLUCOSE, CAPILLARY
Glucose-Capillary: 102 mg/dL — ABNORMAL HIGH (ref 70–99)
Glucose-Capillary: 236 mg/dL — ABNORMAL HIGH (ref 70–99)
Glucose-Capillary: 143 mg/dL — ABNORMAL HIGH (ref 70–99)
Glucose-Capillary: 224 mg/dL — ABNORMAL HIGH (ref 70–99)

## 2023-05-19 MED ORDER — GABAPENTIN 400 MG PO CAPS
800.0000 mg | ORAL_CAPSULE | Freq: Three times a day (TID) | ORAL | Status: DC
Start: 2023-05-19 — End: 2023-05-22
  Administered 2023-05-19 – 2023-05-22 (×10): 800 mg via ORAL
  Filled 2023-05-19 (×9): qty 2
  Filled 2023-05-19: qty 8

## 2023-05-19 NOTE — Progress Notes (Signed)
CARDIAC REHAB PHASE 1 [phone] Attempted to see patient for ambulation. Patient felt like she needed to have a bowl movement, assisted her to the bathroom. She was unsuccessful with BM, and upon standing from the toilet, she felt dizzy and lightheaded, assisted back to bed. Her blood pressure was 115/60.  Reviewed OHS education with her including sternal precautions, restrictions, IS use, symptoms and calling 911, risk factor modification and activity progression. Heart healthy diet and exercise handout given. She verbalizes understanding of instructions given. A phase 2 order has already been placed.  She again felt like she needed to have a bowl movement, so I assisted her back to the bathroom. She still c/o feeling lightheaded and mentioned while she was on the toilet that she'd been seeing stars since the surgery. I informed patient's nurse of these symptoms. Patient assisted back to bed with call bell within reach.   Artist Pais, MS, ACSM CEP 05/19/23 1103

## 2023-05-19 NOTE — Progress Notes (Signed)
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  She states it hurts to take a deep breath and she is coughing some which also hurts.  However, she is not taking pain medication.  She is requesting a pure wick be replaced.  She states she needs a ride home tomorrow  Objective: Vital signs in last 24 hours: Temp:  [97.8 F (36.6 C)-98.7 F (37.1 C)] 98.7 F (37.1 C) (12/21 0308) Pulse Rate:  [76-98] 98 (12/21 0308) Cardiac Rhythm: Normal sinus rhythm (12/21 0345) Resp:  [19-20] 20 (12/21 0308) BP: (106-136)/(54-64) 136/64 (12/21 0308) SpO2:  [90 %-95 %] 90 % (12/21 0308) Weight:  [84.6 kg] 84.6 kg (12/21 0308)  Intake/Output from previous day: 12/20 0701 - 12/21 0700 In: 603 [P.O.:600; I.V.:3] Out: 300 [Urine:300]  General appearance: alert, cooperative, and no distress Heart: regular rate and rhythm Lungs: coarse clears with cough, overall poor respiratory effort Abdomen: soft, non-tender; bowel sounds normal; no masses,  no organomegaly Extremities: edema trace Wound: clean and dry  Lab Results: Recent Labs    05/17/23 0317  WBC 3.2*  HGB 8.3*  HCT 26.2*  PLT 140*   BMET:  Recent Labs    05/17/23 0317 05/18/23 0352  NA 139 140  K 3.8 4.3  CL 100 102  CO2 31 28  GLUCOSE 101* 96  BUN 21 17  CREATININE 1.29* 1.14*  CALCIUM 8.2* 8.5*    PT/INR:  Recent Labs    05/19/23 0255  LABPROT 27.9*  INR 2.6*   ABG    Component Value Date/Time   PHART 7.321 (L) 05/11/2023 1907   HCO3 24.8 05/11/2023 1907   TCO2 26 05/11/2023 1907   ACIDBASEDEF 1.0 05/11/2023 1907   O2SAT 93 05/11/2023 1907   CBG (last 3)  Recent Labs    05/18/23 1601 05/18/23 2120 05/19/23 0604  GLUCAP 118* 191* 102*    Assessment/Plan: S/P  Procedure(s) (LRB): 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 will get repeat CXR in AM INR 2.6 continue coumadin at 2 mg daily Renal- creatinine has been stable, weight is almost at baseline, continue Lasix, potassium for now Pain control- patient is not using pain medication states it makes her itch and she has fatty liver.. she agreed to take some Tylenol this morning.... home Gabapentin regimen requested and has been resumed.. patient is lying in bed most of the day and she was instructed on importance of getting up to help alleviate hip/back discomfort DM- on SSIP for now, will resume Metformin at time of discharge Deconditioning- home health RN/OT/PT has been arranged, home oxygen has been arranged.. patient with multiple social issues.. per CM in progression yesterday patient has an apartment with court date rescheduled for end of January for possible eviction.. sister is to help at discharge... she will require ride at time of discharge Dispo- patient is stable, continue aggressive pulmonary toilet, patient's poor respiratory efforts/cough is definitely attributing factor.. will repeat CXR in AM,  maintaining NSR.Marland Kitchen if remains stable today, will plan for d/c in AM   LOS: 8 days    Lowella Dandy, PA-C 05/19/2023

## 2023-05-19 NOTE — Plan of Care (Signed)
  Problem: Clinical Measurements: Goal: Ability to maintain clinical measurements within normal limits will improve Outcome: Progressing   Problem: Health Behavior/Discharge Planning: Goal: Ability to manage health-related needs will improve Outcome: Progressing   Problem: Clinical Measurements: Goal: Diagnostic test results will improve Outcome: Progressing   Problem: Clinical Measurements: Goal: Will remain free from infection Outcome: Progressing   Problem: Clinical Measurements: Goal: Respiratory complications will improve Outcome: Progressing   Problem: Clinical Measurements: Goal: Cardiovascular complication will be avoided Outcome: Progressing   Problem: Activity: Goal: Risk for activity intolerance will decrease Outcome: Progressing

## 2023-05-20 ENCOUNTER — Inpatient Hospital Stay (HOSPITAL_COMMUNITY): Payer: 59

## 2023-05-20 LAB — GLUCOSE, CAPILLARY
Glucose-Capillary: 116 mg/dL — ABNORMAL HIGH (ref 70–99)
Glucose-Capillary: 120 mg/dL — ABNORMAL HIGH (ref 70–99)
Glucose-Capillary: 139 mg/dL — ABNORMAL HIGH (ref 70–99)
Glucose-Capillary: 142 mg/dL — ABNORMAL HIGH (ref 70–99)
Glucose-Capillary: 151 mg/dL — ABNORMAL HIGH (ref 70–99)
Glucose-Capillary: 161 mg/dL — ABNORMAL HIGH (ref 70–99)

## 2023-05-20 LAB — PROTIME-INR
INR: 2.7 — ABNORMAL HIGH (ref 0.8–1.2)
Prothrombin Time: 28.7 s — ABNORMAL HIGH (ref 11.4–15.2)

## 2023-05-20 MED ORDER — HYDROCORTISONE 1 % EX CREA
TOPICAL_CREAM | Freq: Two times a day (BID) | CUTANEOUS | Status: DC | PRN
  Filled 2023-05-20: qty 28

## 2023-05-20 MED ORDER — DIPHENHYDRAMINE HCL 50 MG/ML IJ SOLN: 12.5000 mg | Freq: Once | INTRAMUSCULAR | Status: DC

## 2023-05-20 NOTE — Progress Notes (Addendum)
      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  She is scratching her legs non-stop during evaluation.  She continues to complain of pain along right side.  She did not ambulate yesterday due to dizziness.  + BM  Objective: Vital signs in last 24 hours: Temp:  [97.8 F (36.6 C)-98.8 F (37.1 C)] 97.8 F (36.6 C) (12/22 0747) Pulse Rate:  [67-87] 67 (12/22 0427) Cardiac Rhythm: Other (Comment) (12/22 0347) Resp:  [15-20] 20 (12/22 0427) BP: (100-113)/(47-62) 108/47 (12/22 0747) SpO2:  [90 %-96 %] 93 % (12/22 0747) Weight:  [84.6 kg] 84.6 kg (12/22 0427)  Intake/Output from previous day: 12/21 0701 - 12/22 0700 In: 720 [P.O.:720] Out: -   General appearance: alert, cooperative, and no distress Heart: regular rate and rhythm Lungs: clear to auscultation bilaterally Abdomen: soft, non-tender; bowel sounds normal; no masses,  no organomegaly Extremities: edema trace, visible scratch marks Wound: clean and dry  Lab Results: No results for input(s): "WBC", "HGB", "HCT", "PLT" in the last 72 hours. 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 hypotension persists- continue Midodrine 10 mg TID INR 2.7, continue coumadin at 2 mg daily Pulm- severe underlying COPD, on 2L O2, CXR w/o atelectasis, significant pleural effusions.Marland Kitchen educated patient on importance of good respiratory effort despite patient.. this effort remains poor  Renal- patient's weight is almost at baseline, in setting of dizziness, hypotension will stop Lasix, potassium Pruritus-continue prn benadryl, add Hydrocortisone cream prn DM- continue SSIP Dispo- patient stable, monitor HR today, needs to ambulate w/o dizziness.. Lasix, potassium stopped which will hopefully help.. for possible d/c in AM   LOS: 9 days    Lowella Dandy, PA-C 05/20/2023 Patient seen and examined, agree with above Slow to progress  Viviann Spare C. Dorris Fetch, MD [facility] [phone]

## 2023-05-20 NOTE — Plan of Care (Signed)
  Problem: Clinical Measurements: Goal: Ability to maintain clinical measurements within normal limits will improve Outcome: Progressing   Problem: Clinical Measurements: Goal: Will remain free from infection Outcome: Progressing   Problem: Clinical Measurements: Goal: Diagnostic test results will improve Outcome: Progressing   Problem: Clinical Measurements: Goal: Respiratory complications will improve Outcome: Progressing   Problem: Nutrition: Goal: Adequate nutrition will be maintained Outcome: Progressing   Problem: Activity: Goal: Risk for activity intolerance will decrease Outcome: Progressing   Problem: Safety: Goal: Ability to remain free from injury will improve Outcome: Progressing   Problem: Pain Management: Goal: General experience of comfort will improve Outcome: Progressing   Problem: Education: Goal: Individualized Educational Video(s) Outcome: Progressing

## 2023-05-21 LAB — GLUCOSE, CAPILLARY
Glucose-Capillary: 118 mg/dL — ABNORMAL HIGH (ref 70–99)
Glucose-Capillary: 133 mg/dL — ABNORMAL HIGH (ref 70–99)
Glucose-Capillary: 125 mg/dL — ABNORMAL HIGH (ref 70–99)
Glucose-Capillary: 168 mg/dL — ABNORMAL HIGH (ref 70–99)

## 2023-05-21 LAB — BASIC METABOLIC PANEL
Anion gap: 10 (ref 5–15)
BUN: 20 mg/dL (ref 8–23)
CO2: 23 mmol/L (ref 22–32)
Calcium: 8.5 mg/dL — ABNORMAL LOW (ref 8.9–10.3)
Chloride: 101 mmol/L (ref 98–111)
Creatinine, Ser: 1.33 mg/dL — ABNORMAL HIGH (ref 0.44–1.00)
GFR, Estimated: 44 mL/min — ABNORMAL LOW (ref >60)
Glucose, Bld: 113 mg/dL — ABNORMAL HIGH (ref 70–99)
Potassium: 3.7 mmol/L (ref 3.5–5.1)
Sodium: 134 mmol/L — ABNORMAL LOW (ref 135–145)

## 2023-05-21 LAB — PROTIME-INR
INR: 2.2 — ABNORMAL HIGH (ref 0.8–1.2)
Prothrombin Time: 24.9 s — ABNORMAL HIGH (ref 11.4–15.2)

## 2023-05-21 LAB — MAGNESIUM: Magnesium: 2.1 mg/dL (ref 1.7–2.4)

## 2023-05-21 MED ORDER — POTASSIUM CHLORIDE CRYS ER 20 MEQ PO TBCR
40.0000 meq | EXTENDED_RELEASE_TABLET | Freq: Once | ORAL | Status: AC
  Administered 2023-05-21: 40 meq via ORAL
  Filled 2023-05-21: qty 2

## 2023-05-21 NOTE — Progress Notes (Signed)
Pt's 02 sat at rest on RA: 87% Pt's 02 sat at rest at 2L 02: 95% Unable to wean pt off from 02.   Lawson Radar, RN

## 2023-05-21 NOTE — Plan of Care (Signed)
  Problem: Education: Goal: Knowledge of General Education information will improve Description: Including pain rating scale, medication(s)/side effects and non-pharmacologic comfort measures Outcome: Progressing   Problem: Health Behavior/Discharge Planning: Goal: Ability to manage health-related needs will improve Outcome: Progressing   Problem: Clinical Measurements: Goal: Ability to maintain clinical measurements within normal limits will improve Outcome: Progressing Goal: Will remain free from infection Outcome: Progressing Goal: Diagnostic test results will improve Outcome: Progressing Goal: Respiratory complications will improve Outcome: Progressing Goal: Cardiovascular complication will be avoided Outcome: Progressing   Problem: Activity: Goal: Risk for activity intolerance will decrease Outcome: Progressing   Problem: Nutrition: Goal: Adequate nutrition will be maintained Outcome: Progressing   Problem: Coping: Goal: Level of anxiety will decrease Outcome: Progressing   Problem: Elimination: Goal: Will not experience complications related to bowel motility Outcome: Progressing Goal: Will not experience complications related to urinary retention Outcome: Progressing   Problem: Activity: Goal: Risk for activity intolerance will decrease Outcome: Progressing   Problem: Cardiac: Goal: Will achieve and/or maintain hemodynamic stability Outcome: Progressing   Problem: Clinical Measurements: Goal: Postoperative complications will be avoided or minimized Outcome: Progressing

## 2023-05-21 NOTE — Progress Notes (Addendum)
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 States her pruritus has resolved.   Objective: Vital signs in last 24 hours: Temp:  [97.8 F (36.6 C)-98.4 F (36.9 C)] 98.1 F (36.7 C) (12/23 0531) Pulse Rate:  [70-79] 79 (12/22 1542) Cardiac Rhythm: Atrial fibrillation (12/23 0026) Resp:  [18-19] 19 (12/23 0531) BP: (108-124)/(47-54) 124/54 (12/22 1542) SpO2:  [93 %-95 %] 95 % (12/23 0531) Weight:  [84.4 kg] 84.4 kg (12/23 0531)  Hemodynamic parameters for last 24 hours:    Intake/Output from previous day: 12/22 0701 - 12/23 0700 In: 480 [P.O.:480] Out: 200 [Urine:200] Intake/Output this shift: No intake/output data recorded.  General appearance: alert, cooperative, and no distress Neurologic: intact Heart: irregularly irregular, controlled rate Lungs: diminished bibasilar breath sounds Abdomen: soft, non-tender; bowel sounds normal; no masses,  no organomegaly Extremities: extremities normal, atraumatic, no cyanosis or edema Wound: Clean and dry without sign of infection  Lab Results: No results for input(s): "WBC", "HGB", "HCT", "PLT" in the last 72 hours. 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 On Midodrine 10mg  TID for hypotension. SBP 124 this AM. Pt complains of dizziness with ambulation. Hopefully will improve with improving BP.   Pulm: Underlying COPD, CXR with stable small bilateral pleural effusions and bibasilar atelectasis. Pt has a history of needing oxygen at home especially at night. Home O2 has been arranged. Encourage pulmonary toilet.   GI: Tolerating a diet. +BM  Endo: Preop A1C 5.8. CBGs controlled on SSI. Patient takes metformin 500mg  BID at home. Will hold right now due to elevated creatinine. Will restart likely at discharge.   Renal: Cr 1.33. Lasix has been d/c'd due to hypotension. K 3.7, supplement  INR: Goal INR 2.5-3. INR 2.2, was 2.7 yesterday. Has been getting 2mg  of Coumadin daily. Give 2.5mg  today?   Dispo: HH RN/PT/OT and home oxygen has been arranged. Patient states she does not know how she will use a walker and carry the oxygen tank with her. Pt sister will be at home to help her. Pt refuses SNF due to dog at home. Give an increased dose of Coumadin today and if at goal hopefully can d/c tomorrow AM.    LOS: 10 days    Jenny Reichmann, PA-C 05/21/2023 Patient seen and examined, d/w Ms. Stehler Issues and plan as detailed above Would benefit from short term SNF,  but refusing  Salvatore Decent. Dorris Fetch, MD [facility] [phone]

## 2023-05-21 NOTE — Progress Notes (Signed)
Mobility Specialist Progress Note:   05/21/23 1433  Mobility  Activity Ambulated with assistance in hallway  Level of Assistance Standby assist, set-up cues, supervision of patient - no hands on  Assistive Device Front wheel walker  Distance Ambulated (ft) 470 ft  RUE Weight Bearing Per Provider Order NWB  LUE Weight Bearing Per Provider Order NWB  Activity Response Tolerated well  Mobility Referral Yes  Mobility visit 1 Mobility  Mobility Specialist Start Time (ACUTE ONLY) 1350  Mobility Specialist Stop Time (ACUTE ONLY) 1405  Mobility Specialist Time Calculation (min) (ACUTE ONLY) 15 min   Pt received in bed, agreeable to mobility. No hands on needed during session. Pt was able to ambulate entire hallway independently without fault. Ambulated on 2 L. C/o slight SOB during ambulation, otherwise asymptomatic throughout. VSS. Pt returned to bed with call bell in reach and all needs met.  Leory Plowman  Mobility Specialist Please contact via [employer] office at [phone]

## 2023-05-21 NOTE — Progress Notes (Signed)
CARDIAC REHAB PHASE I   PRE:  Rate/Rhythm: 68-72 A-fib  BP:  Sitting: 107/61      SpO2: 95 2L  MODE:  Ambulation: 340 ft    POST:  Rate/Rhythm: 76-82 A-fib  BP:  Sitting: 110/61      SpO2: 97 2L  Pt ambulated with supervision assistance, pt stood w/o assistance and ambulated in the hallway with RW. Pt walked at slow and steady pace while carrying a conversation with me. SpO2 94-96% on 2L when checked. Pt returned to bed with call button in reach. Pt wants to walk again later.  Faustino Congress  MS, ACSM-CEP 9:27 AM 05/21/2023    Service time is from 0900 to 0927.

## 2023-05-22 ENCOUNTER — Other Ambulatory Visit (HOSPITAL_COMMUNITY): Payer: Self-pay

## 2023-05-22 LAB — GLUCOSE, CAPILLARY
Glucose-Capillary: 106 mg/dL — ABNORMAL HIGH (ref 70–99)
Glucose-Capillary: 137 mg/dL — ABNORMAL HIGH (ref 70–99)

## 2023-05-22 LAB — BASIC METABOLIC PANEL
Anion gap: 8 (ref 5–15)
BUN: 22 mg/dL (ref 8–23)
CO2: 26 mmol/L (ref 22–32)
Calcium: 8.5 mg/dL — ABNORMAL LOW (ref 8.9–10.3)
Chloride: 101 mmol/L (ref 98–111)
Creatinine, Ser: 1.28 mg/dL — ABNORMAL HIGH (ref 0.44–1.00)
GFR, Estimated: 46 mL/min — ABNORMAL LOW (ref >60)
Glucose, Bld: 189 mg/dL — ABNORMAL HIGH (ref 70–99)
Potassium: 4.5 mmol/L (ref 3.5–5.1)
Sodium: 135 mmol/L (ref 135–145)

## 2023-05-22 LAB — PROTIME-INR
INR: 2.1 — ABNORMAL HIGH (ref 0.8–1.2)
Prothrombin Time: 24.1 s — ABNORMAL HIGH (ref 11.4–15.2)

## 2023-05-22 MED ORDER — TRAMADOL HCL 50 MG PO TABS
50.0000 mg | ORAL_TABLET | Freq: Four times a day (QID) | ORAL | 0 refills | Status: DC | PRN
  Filled 2023-05-22: qty 28, 7d supply, fill #0

## 2023-05-22 MED ORDER — WARFARIN SODIUM 2.5 MG PO TABS
2.5000 mg | ORAL_TABLET | Freq: Once | ORAL | 1 refills | Status: DC
  Filled 2023-05-22: qty 30, 30d supply, fill #0

## 2023-05-22 MED ORDER — MAGIC MOUTHWASH
10.0000 mL | Freq: Three times a day (TID) | ORAL | 0 refills | Status: DC | PRN
  Filled 2023-05-22: qty 240, 8d supply, fill #0

## 2023-05-22 MED ORDER — ACETAMINOPHEN 325 MG PO TABS: 650.0000 mg | ORAL_TABLET | ORAL | Status: DC | PRN

## 2023-05-22 MED ORDER — ROSUVASTATIN CALCIUM 40 MG PO TABS
40.0000 mg | ORAL_TABLET | Freq: Every day | ORAL | 1 refills | Status: DC
  Filled 2023-05-22: qty 30, 30d supply, fill #0

## 2023-05-22 MED ORDER — ROPINIROLE HCL 1 MG PO TABS
1.0000 mg | ORAL_TABLET | Freq: Every day | ORAL | Status: DC
  Filled 2023-05-22: qty 1

## 2023-05-22 MED ORDER — WARFARIN SODIUM 2.5 MG PO TABS: 2.5000 mg | ORAL_TABLET | Freq: Once | ORAL | Status: DC

## 2023-05-22 MED ORDER — NICOTINE 21 MG/24HR TD PT24
21.0000 mg | MEDICATED_PATCH | Freq: Every day | TRANSDERMAL | 0 refills | Status: DC
  Filled 2023-05-22: qty 28, 28d supply, fill #0

## 2023-05-22 MED ORDER — MIDODRINE HCL 10 MG PO TABS
10.0000 mg | ORAL_TABLET | Freq: Three times a day (TID) | ORAL | 1 refills | Status: DC
  Filled 2023-05-22: qty 90, 30d supply, fill #0

## 2023-05-22 NOTE — Progress Notes (Signed)
Pt found in bed without [place], SpO2 reading 87%. Pt informed of SpO2 reading and she placed [place] back on. Pt reports that she is too tired to mobilize and would like to feel stronger before mobilizing again. SpO2 increased to 94% prior to my departure.   Faustino Congress MS, ACSM-CEP  05/22/2023 9:39 AM

## 2023-05-22 NOTE — Progress Notes (Signed)
Patient says she needs a small shower chair with a back for when she goes home.  Harriet Masson, RN

## 2023-05-22 NOTE — Progress Notes (Addendum)
Mobility Specialist Progress Note:   05/22/23 1115  Mobility  Activity Ambulated with assistance in hallway  Level of Assistance  (MinG)  Assistive Device Front wheel walker  Distance Ambulated (ft) 190 ft  RUE Weight Bearing Per Provider Order NWB  LUE Weight Bearing Per Provider Order NWB  Activity Response Tolerated well  Mobility Referral Yes  Mobility visit 1 Mobility  Mobility Specialist Start Time (ACUTE ONLY) 1020  Mobility Specialist Stop Time (ACUTE ONLY) 1040  Mobility Specialist Time Calculation (min) (ACUTE ONLY) 20 min   Post Mobility: 81 HR ,121/68 BP, 96% SpO2 2 L  Pt received in bed, agreeable to mobility. Pt c/o dizziness during ambulation, but no unsteadiness or LOB present. Rated dizziness 8/10. VSS. Pt declining further ambulation d/t fatigue. Pt returned to bed with call bell in reach and all needs met.  Rose [name]  Mobility Specialist Please contact via [employer] office at [phone]

## 2023-05-22 NOTE — Progress Notes (Signed)
Occupational Therapy Treatment Patient Details Name: Rose [name] MRN: [id number] DOB: May 21, 1956 Today's Date: 05/22/2023   History of present illness 67 yo F admitted 12/13 for planned MVR.  PMH: rheumatic mitral stenosis, mitral regurgitation, severe tricuspid regurgitation, CAD, COPD, ADD, depression   OT comments  Pt progressing toward established OT goals. Heavy review of precautions this session as pt dc plans for today. Pt able to perform UB and LB dressing with supervision today and up to min cues for optimal technique. Pt pants slightly tighter at waste and more cues to maintain precautions; recommended elastic wasteband pants while attempting to maintain sternal precautions or asking sister to assist with pulling all the way up in the back. Pt reports she is most concerned about how she will get to clinic for medications. Have reached out to CM. Recommending HHOT.       If plan is discharge home, recommend the following:  A little help with walking and/or transfers;A little help with bathing/dressing/bathroom;A lot of help with bathing/dressing/bathroom;Assistance with cooking/housework;Assist for transportation;Help with stairs or ramp for entrance   Equipment Recommendations  BSC/3in1;Other (comment) (rollator)    Recommendations for Other Services      Precautions / Restrictions Precautions Precautions: Sternal;Fall Precaution Booklet Issued: No Precaution Comments: watch SPO2 Restrictions Weight Bearing Restrictions Per Provider Order: Yes RUE Weight Bearing Per Provider Order: Non weight bearing LUE Weight Bearing Per Provider Order: Non weight bearing Other Position/Activity Restrictions: sternal precautions       Mobility Bed Mobility Overal bed mobility: Needs Assistance Bed Mobility: Supine to Sit     Supine to sit: Min assist     General bed mobility comments: provided by RN with HOB elevated    Transfers Overall transfer level: Needs  assistance Equipment used: None Transfers: Sit to/from Stand, Bed to chair/wheelchair/BSC Sit to Stand: Supervision           General transfer comment: up from EOB 3x     Balance Overall balance assessment: Mild deficits observed, not formally tested                                         ADL either performed or assessed with clinical judgement   ADL Overall ADL's : Needs assistance/impaired     Grooming: Set up;Sitting Grooming Details (indicate cue type and reason): set up for brushing hair, but needed assist to pull back into pony tail         Upper Body Dressing : Set up;Sitting Upper Body Dressing Details (indicate cue type and reason): min cues Lower Body Dressing: Supervision/safety                      Extremity/Trunk Assessment Upper Extremity Assessment Upper Extremity Assessment: Generalized weakness   Lower Extremity Assessment Lower Extremity Assessment: Defer to PT evaluation        Vision       Perception     Praxis      Cognition Arousal: Alert Behavior During Therapy: [facility] for tasks assessed/performed Overall Cognitive Status: Within Functional Limits for tasks assessed                                 General Comments: up to min cues for functional implementation of precautions. Able tor ecall 3/4 on arrival  Exercises      Shoulder Instructions       General Comments      Pertinent Vitals/ Pain       Pain Assessment Pain Assessment: Faces Faces Pain Scale: Hurts a little bit Pain Location: surgical site Pain Descriptors / Indicators: Grimacing Pain Intervention(s): Limited activity within patient's tolerance, Monitored during session  Home Living                                          Prior Functioning/Environment              Frequency  Min 1X/week        Progress Toward Goals  OT Goals(current goals can now be found in the care plan  section)  Progress towards OT goals: Progressing toward goals  Acute Rehab OT Goals Patient Stated Goal: get out of here OT Goal Formulation: With patient Time For Goal Achievement: 05/28/23 Potential to Achieve Goals: Good ADL Goals Pt Will Perform Upper Body Dressing: with set-up;sitting Pt Will Perform Lower Body Dressing: sit to/from stand;with contact guard assist Pt Will Transfer to Toilet: ambulating;with supervision Pt Will Perform Toileting - Clothing Manipulation and hygiene: with supervision;sit to/from stand Additional ADL Goal #1: Pt will perform 8+ mins OOB ADL within sternal precautions  Plan      Co-evaluation                 AM-PAC OT "6 Clicks" Daily Activity     Outcome Measure   Help from another person eating meals?: None Help from another person taking care of personal grooming?: A Little Help from another person toileting, which includes using toliet, bedpan, or urinal?: A Little Help from another person bathing (including washing, rinsing, drying)?: A Little Help from another person to put on and taking off regular upper body clothing?: A Little Help from another person to put on and taking off regular lower body clothing?: A Little 6 Click Score: 19    End of Session Equipment Utilized During Treatment: Oxygen  OT Visit Diagnosis: Unsteadiness on feet (R26.81);Muscle weakness (generalized) (M62.81);Pain   Activity Tolerance Patient tolerated treatment well   Patient Left in bed;with call bell/phone within reach   Nurse Communication Mobility status        Time: 4742-5956 OT Time Calculation (min): 32 min  Charges: OT General Charges $OT Visit: 1 Visit OT Treatments $Self Care/Home Management : 23-37 mins  Tyler Deis, OTR/L [facility] Acute Rehabilitation Office: [phone]   Myrla Halsted 05/22/2023, 10:21 AM

## 2023-05-22 NOTE — Care Management Important Message (Signed)
Important Message  Patient Details  Name: Rose [name] MRN: [id number] Date of Birth: 07-14-1955   Important Message Given:  Yes - Medicare IM     Renie Ora 05/22/2023, 10:27 AM

## 2023-05-22 NOTE — TOC Transition Note (Signed)
Transition of Care (TOC) - Discharge Note Donn Pierini RN, BSN Transitions of Care Unit 4E- RN Case Manager See Treatment Team for direct phone #   Patient Details  Name: Genita Stubler MRN: [id number] Date of Birth: [birth year]  Transition of Care [facility]) CM/SW Contact:  Darrold Span, RN Phone Number: 05/22/2023, 2:38 PM   Clinical Narrative:    Pt stable for transition home today,  CM spoke with pt at bedside and confirmed address- pt voiced she will need transportation home- TOC will provide taxi voucher for transport.   DME for home at bedside- BSC, RW, and portable 02- arranged by [employer]-  Spoke with liaison at [facility] about home 02- and they will not be able to arrange home concentrator as pt has "lost" other concentrator that they had provided (per pt it was stolen). CM contacted [employer] liaison and they will be able to provide needed home 02 concentrator to be delivered later this afternoon.  [employer] to ship shower chair to pt.   HH arranged with [facility]- liaison updated on pt's discharge. Due to transportation issues to get to INR appointment on 12/26[facility] will draw INR and fax to coumadin clinic on [facility].  Dr. [name] has given verbal ok- for INR to be done on 12/27 by [facility].   HH and DME needs arranged prior to discharge, no further needs noted.    Final next level of care: Home w Home Health Services Barriers to Discharge: Barriers Resolved   Patient Goals and CMS Choice Patient states their goals for this hospitalization and ongoing recovery are:: Patient to return to current address   Choice offered to / list presented to :  (office protocol for [facility] PT-added CSW)      Discharge Placement               Home w/ [employer]        Discharge Plan and Services Additional resources added to the After Visit Summary for   In-house Referral: Clinical Social Work Discharge Planning Services: CM Consult Post Acute Care Choice: Home Health          DME  Arranged: Bedside commode, Walker rolling, Oxygen, Shower stool DME Agency: [employer], [employer] Date DME Agency Contacted: 05/22/23 Time DME Agency Contacted: 1346 Representative spoke with at DME Agency: Mitch [facility] Arranged: RN, PT, OT, Social Work [employer] Agency: [facility] Date HH Agency Contacted: 05/22/23 Time HH Agency Contacted: 1451 Representative spoke with at [facility] Agency: Aggie Cosier  Social Drivers of Health (SDOH) Interventions SDOH Screenings   Food Insecurity: No Food Insecurity (05/13/2023)  Housing: High Risk (05/11/2023)  Transportation Needs: No Transportation Needs (05/11/2023)  Utilities: At Risk (05/11/2023)  Alcohol Screen: Low Risk  (12/13/2018)  Financial Resource Strain: Medium Risk (01/25/2023)  Social Connections: Unknown (05/17/2022)   Received from [facility], [facility]  Tobacco Use: High Risk (05/11/2023)     Readmission Risk Interventions    05/22/2023    2:38 PM  Readmission Risk Prevention Plan  Transportation Screening Complete  HRI or Home Care Consult Complete  Social Work Consult for Recovery Care Planning/Counseling Complete  Palliative Care Screening Not Applicable  Medication Review Oceanographer) Complete

## 2023-05-22 NOTE — Progress Notes (Addendum)
[address]       [place] [address]             [phone]      11 Days Post-Op Procedure(s) (LRB): MITRAL VALVE (MV) REPLACEMENT USING MOSAIC VALVE SIZE (N/A) TRANSESOPHAGEAL ECHOCARDIOGRAM (N/A) Subjective: The patient states she walked more yesterday and it tired her out but it felt good. She is also asking for her restless leg syndrome medicine and is wondering when she can go home.   Objective: Vital signs in last 24 hours: Temp:  [97.4 F (36.3 C)-98.2 F (36.8 C)] 97.9 F (36.6 C) (12/24 0331) Pulse Rate:  [60-72] 72 (12/24 0331) Cardiac Rhythm: Atrial fibrillation (12/23 1901) Resp:  [15-20] 16 (12/24 0331) BP: (104-134)/(47-65) 104/55 (12/24 0331) SpO2:  [93 %-98 %] 97 % (12/24 0331) Weight:  [84.8 kg] 84.8 kg (12/24 0446)  Hemodynamic parameters for last 24 hours:    Intake/Output from previous day: 12/23 0701 - 12/24 0700 In: 358 [P.O.:358] Out: -  Intake/Output this shift: No intake/output data recorded.  General appearance: alert, cooperative, and no distress Neurologic: intact Heart: regular rate and rhythm, S1, S2 normal, no murmur, click, rub or gallop Lungs: slight wheezes throughout Abdomen: soft, non-tender; bowel sounds normal; no masses,  no organomegaly Extremities: trace edema Wound: Clean and dry without sign of infection  Lab Results: No results for input(s): "WBC", "HGB", "HCT", "PLT" in the last 72 hours. BMET:  Recent Labs    05/21/23 0300 05/22/23 0317  NA 134* 135  K 3.7 4.5  CL 101 101  CO2 23 26  GLUCOSE 113* 189*  BUN 20 22  CREATININE 1.33* 1.28*  CALCIUM 8.5* 8.5*    PT/INR:  Recent Labs    05/22/23 0317  LABPROT 24.1*  INR 2.1*   ABG    Component Value Date/Time   PHART 7.321 (L) 05/11/2023 1907   HCO3 24.8 05/11/2023 1907   TCO2 26 05/11/2023 1907   ACIDBASEDEF 1.0 05/11/2023 1907   O2SAT 93 05/11/2023 1907   CBG (last 3)  Recent Labs    05/21/23 1610 05/21/23 2107  05/22/23 0605  GLUCAP 125* 168* 106*    Assessment/Plan: S/P Procedure(s) (LRB): MITRAL VALVE (MV) REPLACEMENT USING MOSAIC VALVE SIZE (N/A) TRANSESOPHAGEAL ECHOCARDIOGRAM (N/A)  Neuro: Restless leg syndrome, takes Requip 1mg  at bedtime at home. Will restart  CV: Hx of PAF with controlled rate, converted to NSR without intervention yesterday. NSR HR 60s this AM. On Midodrine 10mg  TID for hypotension. SBP 104-134 but mostly on the soft side.     Pulm: Underlying COPD, CXR with stable small bilateral pleural effusions and bibasilar atelectasis. Pt has a history of needing home oxygen especially at night. Saturating well on 2L . Home O2 has been arranged. Wheezing this AM. Continue nebs. Encourage IS, flutter valve and ambulation.    GI: Tolerating a diet. +BM   Endo: Preop A1C 5.8. CBGs controlled on SSI. Patient takes metformin 500mg  BID at home. Will restart at discharge.    Renal: Cr 1.28. Lasix has been d/c'd due to hypotension. +3lbs from preop weight. K 4.5   INR: Goal INR overall 2-3 but closer to 2.5-3 due to atrial fibrillation. INR 2.1 today, was 2.2 yesterday and 2.7 the prior day after consistently receiving 2mg  of Coumadin daily. As discussed with Dr. Dorris Fetch yesterday 2mg  Coumadin was given. Will give an increased dose of 2.5mg  today and send her home on 2.5mg  daily.   Dispo:  HH RN/PT/OT and home oxygen has been arranged. Pt sister will be at home to help her. Pt refuses SNF due to dog at home. Pt wants a shower chair but insurance will not cover this, patient will have to buy this herself. INR is over 2, will plan to d/c home today on 2.5mg  Coumadin daily. INR check is on 12/26. Patient does not have a ride so a ride home will be arranged for her.   LOS: 11 days    Jenny Reichmann, PA-C 05/22/2023

## 2023-05-22 NOTE — Progress Notes (Addendum)
Discharge instructions reviewed with pt.  Copy of instructions given to pt. Pt has home meds in main pharmacy and have been picked up from pharmacy and being returned to her--will have form signed and place in her chart.    Pt has issues with transportation to have her PT/INR drawn on 12/26 Thursday, spoke with unit CM and she has reached out to [facility] that will be going out on 12/27 Friday, but need approval from cardiology/cardiothoracic surgery if okay to have done on 12/27.  Message sent to Dr [name] and PA Aloha Gell with cardiothoracic. Dr [name] is okay with the 12/27 date by [facility].   Pt has DME BSC, RW and O2 tank in the room---[employer] came to room and spoke with pt about tub bench, they have none in stock and will contact pt and have delivered to her home address when the truck comes in, pt agrees.   Pt has scripts filled in [facility] and will be picked up on the way to the discharge lounge.  CM has provided a cab voucher for pt, d/c lounge will call for the cab.  Pt will be d/c'd via wheelchair with belongings  on O2 2L, to the lounge and will be              escorted by transport lounge staff.   1345 TOC MEDS picked up and delivered to pt in her room.  1349 waiting for lounge transport to pick up pt.   Feliberto Stockley,RN SWOT

## 2023-05-24 ENCOUNTER — Telehealth: Payer: Self-pay

## 2023-05-24 ENCOUNTER — Ambulatory Visit: Payer: 59

## 2023-05-24 NOTE — Telephone Encounter (Signed)
Pt scheduled today for New Coumadin appt post hospitalization for MVR. Per note, pt was discharged with [facility] and the agency will be checking INR since pt has transportation issues and is not able to come to Anticoagulation Clinic. Called and spoke with Renea Ee with [facility] to confirm INR will be checked tomorrow at [facility] visit and provide verbal order if needed. Renea Ee states since yesterday was a Holiday, pt has not been scheduled at this time but she will call to confirm pt's home health visit as soon as possible.   Goal: 2.5-3.0 Admission: 12/13-12/24 Dx: MVR/PAF Current dose: 2.5mg  daily   12/16 2.5mg  12/17 2.5mg  12/18 2.5mg   12/19 2mg  12/20 2mg  12/21 2mg  12/22 2mg  12/23 2mg 

## 2023-05-24 NOTE — Progress Notes (Addendum)
 [address]       [place] [address]             [phone]    HPI: Ms. Baik is a 67 y.o. female with a past medical history of mitral stenosis, mitral regurgitation, tricuspid regurgitation, mild CAD, fibromyalgia, COPD, esophageal dilation, fatty liver, DM, HLD, sciatica, stroke, OSA not using CPAP, and pancreatitis. Patient returns for routine postoperative follow-up having undergone mitral valve replacement utilizing a 29mm Mosaic Mitral valve by Dr. Maryjane on 05/11/23. The patient's early postoperative recovery while in the hospital was notable for paroxysmal rate controlled atrial fibrillation, hypotension requiring midodrine  and a history of COPD with poor respiratory effort requiring 2L of home oxygen at discharge.  The patient refused to go to SNF due to having a dog at home. She was discharged home in stable condition on 12/24 with home health on 2.5mg  of Coumadin  daily and an INR of 2.1. Her INR checked by home health on 12/27 was 1.9, Coumadin  was transitioned to 2.5mg  everyday except for 3.75mg  on Monday and Friday. She saw cardiology on 12/30 who noted nosebleeds likely due to oxygen therapy and recommended she follow up with her PCP. He did note slight edema and that the patient is +6lbs since surgery but did not restart her Lasix  due to hypotension. Since hospital discharge the patient reports her chest soreness has gotten worse and she feels her chest clicking and bulging with deep breaths and coughing. She also admits to shortness of breath and lower extremity swelling. She states her dizziness has improved and she has not had any loss of consciousness or falls. She is asking for a fentanyl  patch today because she is allergic to oxycodone  and says Tramadol  and Tylenol  do not help. She presents without her oxygen because she states it does not work and she is having someone help fix her oxygen tomorrow. She does admit she is still smoking about 2 cigarettes per  day. The patient is due to be evicted from her home and has a court date in January. She is currently living in that home with her sister and dog. Patient states she never found a place to live even with the information social work gave her.   Current Outpatient Medications  Medication Sig Dispense Refill   acetaminophen  (TYLENOL ) 325 MG tablet Take 2 tablets (650 mg total) by mouth every 4 (four) hours as needed.     aspirin  EC 81 MG tablet Take 1 tablet (81 mg total) by mouth daily. Swallow whole. 30 tablet 11   clonazePAM  (KLONOPIN ) 1 MG tablet Take 1 mg by mouth 3 (three) times daily.     Fluticasone -Umeclidin-Vilant (TRELEGY ELLIPTA) 200-62.5-25 MCG/ACT AEPB Inhale 200 mcg into the lungs daily.     gabapentin  (NEURONTIN ) 800 MG tablet Take 800 mg by mouth 3 (three) times daily.     magic mouthwash SOLN Take 10 mLs by mouth 3 (three) times daily as needed for mouth pain. 250 mL 0   metFORMIN  (GLUCOPHAGE ) 500 MG tablet Take 500 mg by mouth 2 (two) times daily.     methocarbamol  (ROBAXIN ) 500 MG tablet Take 1 tablet (500 mg total) by mouth every 8 (eight) hours as needed for muscle spasms. 8 tablet 0   midodrine  (PROAMATINE ) 10 MG tablet Take 1 tablet (10 mg total) by mouth 3 (three) times daily with meals. 90 tablet 1   nicotine  (NICODERM CQ  - DOSED IN MG/24 HOURS) 21 mg/24hr patch Place  1 patch (21 mg total) onto the skin daily. 28 patch 0   omeprazole (PRILOSEC) 40 MG capsule Take 40 mg by mouth daily.     ondansetron  (ZOFRAN ) 4 MG tablet Take 4 mg by mouth every 8 (eight) hours as needed for nausea or vomiting.     Pancrelipase , Lip-Prot-Amyl, (CREON ) 24000-76000 units CPEP Take 1 capsule by mouth 3 (three) times daily after meals.     rOPINIRole  (REQUIP ) 1 MG tablet Take 1 mg by mouth at bedtime.     rosuvastatin  (CRESTOR ) 40 MG tablet Take 1 tablet (40 mg total) by mouth at bedtime. 30 tablet 1   sertraline  (ZOLOFT ) 50 MG tablet Take 50 mg by mouth every morning.     traMADol  (ULTRAM )  50 MG tablet Take 1 tablet (50 mg total) by mouth every 6 (six) hours as needed for moderate pain (pain score 4-6). 28 tablet 0   traZODone  (DESYREL ) 50 MG tablet Take 150 mg by mouth at bedtime.     warfarin (COUMADIN ) 2.5 MG tablet Take 1 tablet (2.5 mg total) by mouth one time only at 4 PM. 30 tablet 1   No current facility-administered medications for this visit.   Vitals: Today's Vitals   05/31/23 1348  BP: 131/80  Pulse: 100  Resp: 20  SpO2: 90%  Weight: 197 lb (89.4 kg)   Body mass index is 36.03 kg/m.  Physical Exam: General: Alert and oriented, no acute distress Neuro: Grossly intact CV: Regular rate and rhythm Pulm: Clear to auscultation bilaterally GI: +BS, nontender, no distension Extremities: 1+ edema bilaterally Wound: Clean and dry without sign of infection. Sternal clicking with palpation.  Diagnostic Tests: No CXR  Impression/Plan: S/P MVR: The patient is progressing slowly from surgery. She complains of sternal pain with clicking and bulging of her left sided chest with coughing that improves when she puts pressure on the area. I personally felt clicking of the inferior portion of the sternum with cough. Will get a chest CT without contrast to ensure sternal stability and proper alignment. The patient is asking for a Fentanyl  patch today. I told her we do not prescribe these and she will have to discuss this with her PCP. She states she finished the Tramadol  even though she felt it did not help with the pain and she is allergic to Oxycodone . She would like something for the pain so I will prescribe one more week of Tramadol  PRN and she can see her PCP for further pain regimen since they prescribe her Gabapentin  800mg  TID. She has no sign of incisional infection and denies fever and chills. She does admit to shortness of breath but has not been using her oxygen because she states it does not work and someone is coming to fix it tomorrow. She is saturating in the 90s  without oxygen today. She continues to smoke but is trying to slow down, I reviewed some options with her to help her quit. She continues to work with home health PT/OT and her ambulation is progressing with and without a walker. She is edematous on exam and her blood pressure is improved today. Will continue Midodrine  and add Lasix  40mg  daily for 7 days. Continue current medications. We reviewed continued sternal precautions as well as endocarditis prophylaxis. The patient states she cannot return to the clinic any earlier than 3 weeks due to her court date. I will call her with the results of the CT scan and recommend she follow up sooner if needed. Otherwise will  schedule follow up with Dr. Maryjane in 3 weeks.   The patient states she still has nowhere to live after all the help she has gotten and nothing has worked. I gave her the number for social work to speak with them regarding this.   Con JAYSON Helm, PA-C [facility] [phone]

## 2023-05-24 NOTE — Telephone Encounter (Signed)
Rose [name] with [facility] called and confirmed INR will be checked tomorrow at home health visit and will call [phone] with results.

## 2023-05-25 ENCOUNTER — Telehealth: Payer: Self-pay

## 2023-05-25 ENCOUNTER — Other Ambulatory Visit: Payer: Self-pay | Admitting: Thoracic Surgery (Cardiothoracic Vascular Surgery)

## 2023-05-25 DIAGNOSIS — Z48812 Encounter for surgical aftercare following surgery on the circulatory system: Secondary | ICD-10-CM | POA: Diagnosis not present

## 2023-05-25 DIAGNOSIS — I110 Hypertensive heart disease with heart failure: Secondary | ICD-10-CM

## 2023-05-25 NOTE — Telephone Encounter (Signed)
INR due today by [facility]. [facility] and spoke with Trey Paula. Stated Marcelino Duster, RN is scheduled to go out to pt's home today but he is unsure what time she will be at the pt's house. I made him aware it is important to have INR results before our office closes at 5pm. He verbalized understanding and stated RN will call the Coumadin Clinic directly as soon as possible.

## 2023-05-27 NOTE — Progress Notes (Unsigned)
Cardiology Office Note    Patient Name: Rose [name] Date of Encounter: 05/28/2023  Primary Care Provider:  Cristino Martes, NP Primary Cardiologist:  Donato Schultz, MD Primary Electrophysiologist: None   Past Medical History    Past Medical History:  Diagnosis Date   Allergy    Anemia    Anxiety    Aortic atherosclerosis (HCC)    Arthritis    Asthma    Back pain    CAD (coronary artery disease)    Chronic headaches    Class 1 obesity due to excess calories with serious comorbidity in adult    Complication of anesthesia    slow to wake up   COPD (chronic obstructive pulmonary disease) (HCC)    Diabetes (HCC)    Fatty liver    GERD (gastroesophageal reflux disease)    Headache    Herniated lumbar intervertebral disc    High cholesterol    History of blood clots    Lumbago with sciatica, unspecified side    Mitral stenosis    severe by 05/18/21 echo   Moderate episode of recurrent major depressive disorder (HCC)    Nicotine dependence, cigarettes, with other nicotine-induced disorders    Nonrheumatic aortic (valve) insufficiency    Other chronic pain    Pain in joint involving ankle and foot    Pancreatitis    Pneumonia    Presence urogenital implant    Severe mitral regurgitation    Sleep apnea    no cpap   Stroke (HCC)    mild   SUI (stress urinary incontinence, female)    Tricuspid regurgitation    Type 2 diabetes mellitus with hyperglycemia, with long-term current use of insulin (HCC)     History of Present Illness  Rose [name] is a 67 y.o. 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  Ms. Darrington was previously followed by [facility] and is currently followed by Dr. [name].  She was seen for initial office visit in 10/27/2022 due to mitral stenosis seen on  echo in 2022.  She had a previous nuclear stress test in 2022 that showed normal RV perfusion with increased RV uptake suggestive of RVH.  She underwent a TEE for further workup as well as R/LHC that showed mild nonobstructive disease and severe MS with EF of 60 to 65%.  She was referred to Dr. Leafy Ro on 01/08/2023 with recommendation and plan to undergo MVR.  Her surgery however was complicated by unstable housing situation with pending addiction. She was seen by Lucile Crater, PA on 01/24/2023 and was referred to social work for assistance with home situation.  She underwent surgery on 05/11/2023 utilizing a 29mm Mosaic Mitral valve along with TR repair. She tolerated the procedure well.  She developed atrial fibrillation but quickly converted to NSR with an Amiodarone bolus. She was started on Coumadin with home health arranged to check INR also required home O2 with 2 L.  She refused SNF due to having dog at home was discharged home with sister to help with support.  She was discharged on 05/22/2023 with follow-up in January with Dr. Leafy Ro.  During today's visit the patient reports that she is feeling better with improvement to shortness of breath no dizziness noted.  The patient is on warfarin and has a history of easy bruising. She  is experiencing dryness and bleeding in the nose due to oxygen therapy and was advised to follow-up with PCP regarding modification.  She is euvolemic on exam slight edema in the left leg versus right.  The patient also reports an infection in the mouth and is on two types of mouthwash.  In regards to her home situation she has a court date scheduled in January.  She denies chest pain, palpitations, dyspnea, PND, orthopnea, nausea, vomiting, dizziness, syncope, edema, weight gain, or early satiety.  Review of Systems  Please see the history of present illness.    All other systems reviewed and are otherwise negative except as noted above.  Physical Exam    Wt Readings from  Last 3 Encounters:  05/28/23 194 lb (88 kg)  05/22/23 186 lb 15.2 oz (84.8 kg)  05/09/23 183 lb (83 kg)   VS: Vitals:   05/28/23 1107  BP: 112/66  Pulse: 98  SpO2: 92%  ,Body mass index is 35.48 kg/m. GEN: Well nourished, well developed in no acute distress Neck: No JVD; No carotid bruits Pulmonary: Clear to auscultation without rales, wheezing or rhonchi  Cardiovascular: Normal rate. Regular median sternotomy incision clean dry and intact rhythm. Normal S1. Normal S2.   Murmurs: There is no murmur.  ABDOMEN: Soft, non-tender, non-distended EXTREMITIES:  No edema; No deformity   EKG/LABS/ Recent Cardiac Studies   ECG personally reviewed by me today -none completed today  Risk Assessment/Calculations:     Lab Results  Component Value Date   WBC 3.2 (L) 05/17/2023   HGB 8.3 (L) 05/17/2023   HCT 26.2 (L) 05/17/2023   MCV 89.7 05/17/2023   PLT 140 (L) 05/17/2023   Lab Results  Component Value Date   CREATININE 1.28 (H) 05/22/2023   BUN 22 05/22/2023   NA 135 05/22/2023   K 4.5 05/22/2023   CL 101 05/22/2023   CO2 26 05/22/2023   Lab Results  Component Value Date   CHOL 158 12/13/2018   HDL 51 12/13/2018   LDLCALC 80 12/13/2018   TRIG 133 12/13/2018   CHOLHDL 3.1 12/13/2018    Lab Results  Component Value Date   HGBA1C 5.8 (H) 05/09/2023   Assessment & Plan    1.  Severe rheumatic MS/MR: -s/p MVR with 29 mm Mosaic mitral valve and is scheduled to follow-up with Dr. Leafy Ro on 05/31/2023 -SBE prophylaxis discussed -Continue Coumadin as prescribed -Patient is up 6 pounds since her valve replacement procedure -Deferred adding Lasix due to hypotension and encourage patient to abstain from excess salt and weigh daily.  2.  Coronary artery disease: -Mild by left heart cath with no reports of chest pain or angina -Continue Crestor 40 mg daily and ASA 81 mg  3.  Hyperlipidemia: -Managed by PCP -Continue Crestor 40 mg daily  4.  DM type II: -Patient was  encouraged to follow-up with PCP to obtain glucose monitor -Continue treatment plan per PCP  5.  History of COPD: -Patient currently on home O2 and followed by pulmonology    Cardiac Rehabilitation Eligibility Assessment  The patient is ready to start cardiac rehabilitation pending clearance from the cardiac surgeon.     Disposition: Follow-up with Donato Schultz, MD or APP in 3 months   Signed, Napoleon Form, Leodis Rains, NP 05/28/2023, 12:29 PM [facility]

## 2023-05-28 ENCOUNTER — Ambulatory Visit: Payer: 59 | Attending: Nurse Practitioner | Admitting: Nurse Practitioner

## 2023-05-28 ENCOUNTER — Encounter: Payer: Self-pay | Admitting: Nurse Practitioner

## 2023-05-28 ENCOUNTER — Telehealth: Payer: Self-pay | Admitting: *Deleted

## 2023-05-28 ENCOUNTER — Ambulatory Visit (INDEPENDENT_AMBULATORY_CARE_PROVIDER_SITE_OTHER): Payer: 59 | Admitting: Internal Medicine

## 2023-05-28 VITALS — BP 112/66 | HR 98 | Ht 62.0 in | Wt 194.0 lb

## 2023-05-28 DIAGNOSIS — Z952 Presence of prosthetic heart valve: Secondary | ICD-10-CM

## 2023-05-28 DIAGNOSIS — I4891 Unspecified atrial fibrillation: Secondary | ICD-10-CM | POA: Insufficient documentation

## 2023-05-28 DIAGNOSIS — I051 Rheumatic mitral insufficiency: Secondary | ICD-10-CM

## 2023-05-28 DIAGNOSIS — J449 Chronic obstructive pulmonary disease, unspecified: Secondary | ICD-10-CM

## 2023-05-28 DIAGNOSIS — E785 Hyperlipidemia, unspecified: Secondary | ICD-10-CM

## 2023-05-28 DIAGNOSIS — Z7901 Long term (current) use of anticoagulants: Secondary | ICD-10-CM | POA: Diagnosis not present

## 2023-05-28 DIAGNOSIS — E1159 Type 2 diabetes mellitus with other circulatory complications: Secondary | ICD-10-CM

## 2023-05-28 DIAGNOSIS — I2510 Atherosclerotic heart disease of native coronary artery without angina pectoris: Secondary | ICD-10-CM | POA: Diagnosis not present

## 2023-05-28 LAB — POCT INR: INR: 1.9 — AB (ref 2.0–3.0)

## 2023-05-28 NOTE — Patient Instructions (Signed)
Medication Instructions:   Your physician recommends that you continue on your current medications as directed. Please refer to the Current Medication list given to you today.   *If you need a refill on your cardiac medications before your next appointment, please call your pharmacy*   Lab Work:  None ordered  If you have labs (blood work) drawn today and your tests are completely normal, you will receive your results only by: MyChart Message (if you have MyChart) OR A paper copy in the mail If you have any lab test that is abnormal or we need to change your treatment, we will call you to review the results.   Testing/Procedures:  None ordered   Follow-Up: At [facility], you and your health needs are our priority.  As part of our continuing mission to provide you with exceptional heart care, we have created designated Provider Care Teams.  These Care Teams include your primary Cardiologist (physician) and Advanced Practice Providers (APPs -  Physician Assistants and Nurse Practitioners) who all work together to provide you with the care you need, when you need it.  We recommend signing up for the patient portal called "MyChart".  Sign up information is provided on this After Visit Summary.  MyChart is used to connect with patients for Virtual Visits (Telemedicine).  Patients are able to view lab/test results, encounter notes, upcoming appointments, etc.  Non-urgent messages can be sent to your provider as well.   To learn more about what you can do with MyChart, go to [email or web address].    Your next appointment:   3 month(s)  Provider:   Ronie Spies, PA-C         Other Instructions DASH Eating Plan DASH stands for Dietary Approaches to Stop Hypertension. The DASH eating plan is a healthy eating plan that has been shown to: Lower high blood pressure (hypertension). Reduce your risk for type 2 diabetes, heart disease, and stroke. Help with weight loss. What are  tips for following this plan? Reading food labels Check food labels for the amount of salt (sodium) per serving. Choose foods with less than 5 percent of the Daily Value (DV) of sodium. In general, foods with less than 300 milligrams (mg) of sodium per serving fit into this eating plan. To find whole grains, look for the word "whole" as the first word in the ingredient list. Shopping Buy products labeled as "low-sodium" or "no salt added." Buy fresh foods. Avoid canned foods and pre-made or frozen meals. Cooking Try not to add salt when you cook. Use salt-free seasonings or herbs instead of table salt or sea salt. Check with your health care provider or pharmacist before using salt substitutes. Do not fry foods. Cook foods in healthy ways, such as baking, boiling, grilling, roasting, or broiling. Cook using oils that are good for your heart. These include olive, canola, avocado, soybean, and sunflower oil. Meal planning  Eat a balanced diet. This should include: 4 or more servings of fruits and 4 or more servings of vegetables each day. Try to fill half of your plate with fruits and vegetables. 6-8 servings of whole grains each day. 6 or less servings of lean meat, poultry, or fish each day. 1 oz is 1 serving. A 3 oz (85 g) serving of meat is about the same size as the palm of your hand. One egg is 1 oz (28 g). 2-3 servings of low-fat dairy each day. One serving is 1 cup (237 mL). 1 serving of nuts,  seeds, or beans 5 times each week. 2-3 servings of heart-healthy fats. Healthy fats called omega-3 fatty acids are found in foods such as walnuts, flaxseeds, fortified milks, and eggs. These fats are also found in cold-water fish, such as sardines, salmon, and mackerel. Limit how much you eat of: Canned or prepackaged foods. Food that is high in trans fat, such as fried foods. Food that is high in saturated fat, such as fatty meat. Desserts and other sweets, sugary drinks, and other foods with  added sugar. Full-fat dairy products. Do not salt foods before eating. Do not eat more than 4 egg yolks a week. Try to eat at least 2 vegetarian meals a week. Eat more home-cooked food and less restaurant, buffet, and fast food. Lifestyle When eating at a restaurant, ask if your food can be made with less salt or no salt. If you drink alcohol: Limit how much you have to: 0-1 drink a day if you are female. 0-2 drinks a day if you are female. Know how much alcohol is in your drink. In the [place] one drink is one 12 oz bottle of beer (355 mL), one 5 oz glass of wine (148 mL), or one 1 oz glass of hard liquor (44 mL). General information Avoid eating more than 2,300 mg of salt a day. If you have hypertension, you may need to reduce your sodium intake to 1,500 mg a day. Work with your provider to stay at a healthy body weight or lose weight. Ask what the best weight range is for you. On most days of the week, get at least 30 minutes of exercise that causes your heart to beat faster. This may include walking, swimming, or biking. Work with your provider or dietitian to adjust your eating plan to meet your specific calorie needs. What foods should I eat? Fruits All fresh, dried, or frozen fruit. Canned fruits that are in their natural juice and do not have sugar added to them. Vegetables Fresh or frozen vegetables that are raw, steamed, roasted, or grilled. Low-sodium or reduced-sodium tomato and vegetable juice. Low-sodium or reduced-sodium tomato sauce and tomato paste. Low-sodium or reduced-sodium canned vegetables. Grains Whole-grain or whole-wheat bread. Whole-grain or whole-wheat pasta. Brown rice. Orpah Cobb. Bulgur. Whole-grain and low-sodium cereals. Pita bread. Low-fat, low-sodium crackers. Whole-wheat flour tortillas. Meats and other proteins Skinless chicken or [place]. Ground chicken or [place]. Pork with fat trimmed off. Fish and seafood. Egg whites. Dried beans, peas, or lentils.  Unsalted nuts, nut butters, and seeds. Unsalted canned beans. Lean cuts of beef with fat trimmed off. Low-sodium, lean precooked or cured meat, such as sausages or meat loaves. Dairy Low-fat (1%) or fat-free (skim) milk. Reduced-fat, low-fat, or fat-free cheeses. Nonfat, low-sodium ricotta or cottage cheese. Low-fat or nonfat yogurt. Low-fat, low-sodium cheese. Fats and oils Soft margarine without trans fats. Vegetable oil. Reduced-fat, low-fat, or light mayonnaise and salad dressings (reduced-sodium). Canola, safflower, olive, avocado, soybean, and sunflower oils. Avocado. Seasonings and condiments Herbs. Spices. Seasoning mixes without salt. Other foods Unsalted popcorn and pretzels. Fat-free sweets. The items listed above may not be all the foods and drinks you can have. Talk to a dietitian to learn more. What foods should I avoid? Fruits Canned fruit in a light or heavy syrup. Fried fruit. Fruit in cream or butter sauce. Vegetables Creamed or fried vegetables. Vegetables in a cheese sauce. Regular canned vegetables that are not marked as low-sodium or reduced-sodium. Regular canned tomato sauce and paste that are not marked as low-sodium  or reduced-sodium. Regular tomato and vegetable juices that are not marked as low-sodium or reduced-sodium. Rosita Fire. Olives. Grains Baked goods made with fat, such as croissants, muffins, or some breads. Dry pasta or rice meal packs. Meats and other proteins Fatty cuts of meat. Ribs. Fried meat. Tomasa Blase. Bologna, salami, and other precooked or cured meats, such as sausages or meat loaves, that are not lean and low in sodium. Fat from the back of a pig (fatback). Bratwurst. Salted nuts and seeds. Canned beans with added salt. Canned or smoked fish. Whole eggs or egg yolks. Chicken or [place] with skin. Dairy Whole or 2% milk, cream, and half-and-half. Whole or full-fat cream cheese. Whole-fat or sweetened yogurt. Full-fat cheese. Nondairy creamers. Whipped  toppings. Processed cheese and cheese spreads. Fats and oils Butter. Stick margarine. Lard. Shortening. Ghee. Bacon fat. Tropical oils, such as coconut, palm kernel, or palm oil. Seasonings and condiments Onion salt, garlic salt, seasoned salt, table salt, and sea salt. Worcestershire sauce. Tartar sauce. Barbecue sauce. Teriyaki sauce. Soy sauce, including reduced-sodium soy sauce. Steak sauce. Canned and packaged gravies. Fish sauce. Oyster sauce. Cocktail sauce. Store-bought horseradish. Ketchup. Mustard. Meat flavorings and tenderizers. Bouillon cubes. Hot sauces. Pre-made or packaged marinades. Pre-made or packaged taco seasonings. Relishes. Regular salad dressings. Other foods Salted popcorn and pretzels. The items listed above may not be all the foods and drinks you should avoid. Talk to a dietitian to learn more. Where to find more information National Heart, Lung, and Blood Institute (NHLBI): BuffaloDryCleaner.gl American Heart Association (AHA): heart.org Academy of Nutrition and Dietetics: eatright.org National Kidney Foundation (NKF): kidney.org This information is not intended to replace advice given to you by your health care provider. Make sure you discuss any questions you have with your health care provider. Document Revised: 06/01/2022 Document Reviewed: 06/01/2022 Elsevier Patient Education  2024 [employer].

## 2023-05-28 NOTE — Telephone Encounter (Signed)
Pt calling in today after seeing Alden Server and wanted Lasix refilled.  Alden Server stated would not fill lasix due to pt having to BP and near syncope.  Pt stated what is to do with swelling? Alden Server stated can call TCTS and inquire but Alden Server is not filling medication.

## 2023-05-29 ENCOUNTER — Telehealth (HOSPITAL_COMMUNITY): Payer: Self-pay

## 2023-05-29 ENCOUNTER — Ambulatory Visit: Payer: 59

## 2023-05-29 NOTE — Telephone Encounter (Signed)
 Called patient to see if she is interested in the Cardiac Rehab Program. Patient expressed interest. Explained scheduling process and went over insurance, patient verbalized understanding. Will contact patient for scheduling once f/u has been completed.

## 2023-05-31 ENCOUNTER — Ambulatory Visit (INDEPENDENT_AMBULATORY_CARE_PROVIDER_SITE_OTHER): Payer: Medicaid Other | Admitting: Physician Assistant

## 2023-05-31 ENCOUNTER — Other Ambulatory Visit: Payer: Self-pay | Admitting: Thoracic Surgery (Cardiothoracic Vascular Surgery)

## 2023-05-31 ENCOUNTER — Encounter: Payer: Self-pay | Admitting: Physician Assistant

## 2023-05-31 VITALS — BP 131/80 | HR 100 | Resp 20 | Wt 197.0 lb

## 2023-05-31 DIAGNOSIS — Z952 Presence of prosthetic heart valve: Secondary | ICD-10-CM

## 2023-05-31 DIAGNOSIS — I059 Rheumatic mitral valve disease, unspecified: Secondary | ICD-10-CM

## 2023-05-31 MED ORDER — FUROSEMIDE 40 MG PO TABS: 40.0000 mg | ORAL_TABLET | Freq: Every day | ORAL | 0 refills | Status: DC

## 2023-05-31 MED ORDER — POTASSIUM CHLORIDE CRYS ER 20 MEQ PO TBCR: 20.0000 meq | EXTENDED_RELEASE_TABLET | Freq: Every day | ORAL | 0 refills | Status: DC

## 2023-05-31 MED ORDER — TRAMADOL HCL 50 MG PO TABS: 50.0000 mg | ORAL_TABLET | Freq: Four times a day (QID) | ORAL | 0 refills | Status: DC | PRN

## 2023-05-31 NOTE — Patient Instructions (Addendum)
Continue to avoid any heavy lifting or strenuous use of your arms or shoulders for at least a total of three months from the time of surgery.  After three months you may gradually increase how much you lift or otherwise use your arms or chest as tolerated, with limits based upon whether or not activities lead to the return of significant discomfort.  Endocarditis is a potentially serious infection of heart valves or inside lining of the heart.  It occurs more commonly in patients with diseased heart valves (such as patient's with aortic or mitral valve disease) and in patients who have undergone heart valve repair or replacement.  Certain surgical and dental procedures may put you at risk, such as dental cleaning, other dental procedures, or any surgery involving the respiratory, urinary, gastrointestinal tract, gallbladder or prostate gland.   To minimize your chances for develooping endocarditis, maintain good oral health and seek prompt medical attention for any infections involving the mouth, teeth, gums, skin or urinary tract.    Always notify your doctor or dentist about your underlying heart valve condition before having any invasive procedures. You will need to take antibiotics before certain procedures, including all routine dental cleanings or other dental procedures.  Your cardiologist or dentist should prescribe these antibiotics for you to be taken ahead of time.      

## 2023-06-05 ENCOUNTER — Ambulatory Visit (INDEPENDENT_AMBULATORY_CARE_PROVIDER_SITE_OTHER): Payer: 59 | Admitting: Internal Medicine

## 2023-06-05 DIAGNOSIS — Z7901 Long term (current) use of anticoagulants: Secondary | ICD-10-CM | POA: Diagnosis not present

## 2023-06-05 DIAGNOSIS — Z952 Presence of prosthetic heart valve: Secondary | ICD-10-CM | POA: Diagnosis not present

## 2023-06-05 LAB — POCT INR: INR: 1.3 — AB (ref 2.0–3.0)

## 2023-06-06 ENCOUNTER — Other Ambulatory Visit: Payer: Self-pay | Admitting: Physician Assistant

## 2023-06-06 ENCOUNTER — Other Ambulatory Visit: Payer: 59

## 2023-06-06 ENCOUNTER — Ambulatory Visit
Admission: RE | Admit: 2023-06-06 | Discharge: 2023-06-06 | Disposition: A | Discharge status: Home / Self Care | Payer: 59 | Source: Ambulatory Visit | Attending: Thoracic Surgery (Cardiothoracic Vascular Surgery) | Admitting: Thoracic Surgery (Cardiothoracic Vascular Surgery)

## 2023-06-06 DIAGNOSIS — I059 Rheumatic mitral valve disease, unspecified: Secondary | ICD-10-CM

## 2023-06-11 ENCOUNTER — Telehealth: Payer: Self-pay | Admitting: *Deleted

## 2023-06-11 ENCOUNTER — Telehealth (HOSPITAL_COMMUNITY): Payer: Self-pay | Admitting: Licensed Clinical Social Worker

## 2023-06-11 NOTE — Telephone Encounter (Signed)
 Entered in error

## 2023-06-11 NOTE — Telephone Encounter (Addendum)
 H&V Care Navigation CSW Progress Note  Clinical Social Worker provided permission to speak with [name] through [facility] program that is working alongside her [employer].  States they completed TCL (transitions to community living) application for patient while she was in hospital and are awaiting determination on this.  States it could lead to immediate housing opportunity for patient- they have informed case workers reviewing case of urgency of application and are awaiting return call.  Reports Christy with [employer] potentially could help with temporary hotel stay- still awaiting return call from [place] regarding options for patient.  CSW received call back from [facility] who reports she is helping to follow up on TCL referral but that she has not heard anything at this time.  Reports that patient could potential get hotel stay if she is approved for TCL housing but isn't able to get in somewhere immediately.  She will follow up with her contacts and update CSW as able.   SDOH Screenings   Food Insecurity: No Food Insecurity (05/13/2023)  Housing: High Risk (06/11/2023)  Transportation Needs: No Transportation Needs (05/11/2023)  Utilities: At Risk (05/11/2023)  Alcohol Screen: Low Risk  (12/13/2018)  Financial Resource Strain: Medium Risk (01/25/2023)  Social Connections: Unknown (05/17/2022)   Received from [facility], [facility]  Tobacco Use: High Risk (05/31/2023)    Rose HILARIO Leech, LCSW Clinical Social Worker [facility] Desk#: [phone] Cell#: [phone]

## 2023-06-11 NOTE — Telephone Encounter (Signed)
 H&V Care Navigation CSW Progress Note  Clinical Social Worker received email from legal aid stating that patient is due to have apartment padlocked this week.  CSW called pt to discuss.  Reports she has no one to stay with temporarily if this happens and refuses shelter as she can't take her service dog with her.    Patient had been living alone but sister has been staying with her since surgery to help at home with dog.  States sister was homeless prior to this so has no where for them to go either.  Patient has income of about $1,050 and reports sister has around $1,100.  She does not want to rely on sisters income for finding housing so would prefer to have rent $800 or less which she states she can afford.    Pt has been working with [employer] on finding housing- CSW left VM for [employer] case worker to discuss progress but per patient nothing affordable or in area she was agreeable to to at this time.  CSW messaged management company to get timeline of padlocking to help with planning.  CSW messaged [facility] ending homeless for advice on patients situation.  Will continue to follow and assist as needed.   SDOH Screenings   Food Insecurity: No Food Insecurity (05/13/2023)  Housing: High Risk (06/11/2023)  Transportation Needs: No Transportation Needs (05/11/2023)  Utilities: At Risk (05/11/2023)  Alcohol Screen: Low Risk  (12/13/2018)  Financial Resource Strain: Medium Risk (01/25/2023)  Social Connections: Unknown (05/17/2022)   Received from [facility], [facility]  Tobacco Use: High Risk (05/31/2023)    Rose HILARIO Leech, LCSW Clinical Social Worker [facility] Desk#: [phone] Cell#: [phone]

## 2023-06-11 NOTE — Telephone Encounter (Signed)
 Our office was contacted by SW with patient complaint of chest pain. Patient contacted. Per patient her chest hurts when she gets upset. Patient states she is feeling better now. Nothing further at this time.

## 2023-06-12 ENCOUNTER — Telehealth: Payer: Self-pay

## 2023-06-12 NOTE — Telephone Encounter (Signed)
 Lpmtcb and let us know if [facility] came by today to check INR.

## 2023-06-13 ENCOUNTER — Telehealth (HOSPITAL_COMMUNITY): Payer: Self-pay | Admitting: Licensed Clinical Social Worker

## 2023-06-13 ENCOUNTER — Telehealth: Payer: Self-pay | Admitting: *Deleted

## 2023-06-13 NOTE — Telephone Encounter (Signed)
 CSW messaged [facility] worker for update regarding possible funding for temporary hotel- they have not responded- left VM  Denton Flakes, LCSW Clinical Social Worker [facility] Desk#: [phone] Cell#: [phone]

## 2023-06-13 NOTE — Telephone Encounter (Signed)
 Spoke with pt to inquire if the Home Health Nurse had been out and she stated no and did not know when they would be coming.   Called [facility] to inquire if INR had been done or when it is due to be done. Dee Farber advised it will on Thursday or Friday and he confirmed INR order was on the visit.

## 2023-06-14 ENCOUNTER — Telehealth (HOSPITAL_COMMUNITY): Payer: Self-pay | Admitting: Licensed Clinical Social Worker

## 2023-06-14 NOTE — Telephone Encounter (Signed)
H&V Care Navigation CSW Progress Note  Clinical Social Worker spoke with [facility] worker this morning who states we can refer for hotel assistance but it would likely be only a week work of funding for her stay and they will need the official padlock date which we don't have at this time- will refer when appropriate.  Spoke with pt to update them on this.  Spoke with pt caseworker Joni Reining [phone]) who reports that patient has been officially denied for TCL program again.  States that their current plan is to help the patient move large items into storage unit today and then when her apartment is padlocked to transport her to a motel where she and her sister can stay for $400/week.  They are continuing to help pursue income based long term housing options for the patient and will help her find permanent housing moving forward.  Will continue to follow and assist as needed   SDOH Screenings   Food Insecurity: No Food Insecurity (05/13/2023)  Housing: High Risk (06/11/2023)  Transportation Needs: No Transportation Needs (05/11/2023)  Utilities: At Risk (05/11/2023)  Alcohol Screen: Low Risk  (12/13/2018)  Financial Resource Strain: Medium Risk (01/25/2023)  Social Connections: Unknown (05/17/2022)   Received from [facility], [facility]  Tobacco Use: High Risk (05/31/2023)    Burna Sis, LCSW Clinical Social Worker [facility] Desk#: [phone] Cell#: [phone]

## 2023-06-15 ENCOUNTER — Other Ambulatory Visit: Payer: Self-pay

## 2023-06-15 ENCOUNTER — Ambulatory Visit (INDEPENDENT_AMBULATORY_CARE_PROVIDER_SITE_OTHER): Payer: 59 | Admitting: Cardiovascular Disease

## 2023-06-15 ENCOUNTER — Telehealth: Payer: Self-pay | Admitting: Cardiology

## 2023-06-15 DIAGNOSIS — Z7901 Long term (current) use of anticoagulants: Secondary | ICD-10-CM

## 2023-06-15 DIAGNOSIS — Z952 Presence of prosthetic heart valve: Secondary | ICD-10-CM

## 2023-06-15 LAB — POCT INR: INR: 1.4 — AB (ref 2.0–3.0)

## 2023-06-15 MED ORDER — WARFARIN SODIUM 2.5 MG PO TABS: ORAL_TABLET | ORAL | 1 refills | Status: DC

## 2023-06-15 NOTE — Telephone Encounter (Signed)
[name] report I&R results

## 2023-06-15 NOTE — Telephone Encounter (Signed)
Left voicemail to return call to office.

## 2023-06-15 NOTE — Telephone Encounter (Signed)
Spoke to [name] from [facility] in regards to patient's INR

## 2023-06-19 ENCOUNTER — Other Ambulatory Visit: Payer: Self-pay | Admitting: Cardiology

## 2023-06-20 ENCOUNTER — Telehealth (HOSPITAL_COMMUNITY): Payer: Self-pay | Admitting: Licensed Clinical Social Worker

## 2023-06-20 NOTE — Telephone Encounter (Signed)
H&V Care Navigation CSW Progress Note  Clinical Social Worker spoke with pt Oncologist regarding updates.  Patient has not been padlocked out of her apartment yet as the sheriffs office lost the order- unsure when she will be padlocked.  Joni Reining the case worker confirms that the plan is still for the pt to go to a motel and that she has sufficient funds to pay for motel short term.  Also states that TCM worker Neysa Bonito will plan to apply for another housing program for her once she is padlocked and officially homeless.  Will continue to follow and assist as needed  SDOH Screenings   Food Insecurity: No Food Insecurity (05/13/2023)  Housing: High Risk (06/11/2023)  Transportation Needs: No Transportation Needs (05/11/2023)  Utilities: At Risk (05/11/2023)  Alcohol Screen: Low Risk  (12/13/2018)  Financial Resource Strain: Medium Risk (01/25/2023)  Social Connections: Unknown (05/17/2022)   Received from [facility], [facility]  Tobacco Use: High Risk (05/31/2023)    Burna Sis, LCSW Clinical Social Worker [facility] Desk#: [phone] Cell#: [phone]

## 2023-06-21 ENCOUNTER — Emergency Department (HOSPITAL_COMMUNITY)
Admission: EM | Admit: 2023-06-21 | Discharge: 2023-06-22 | Disposition: A | Discharge status: Home / Self Care | Payer: 59 | Attending: Emergency Medicine | Admitting: Emergency Medicine

## 2023-06-21 ENCOUNTER — Emergency Department (HOSPITAL_COMMUNITY): Payer: 59

## 2023-06-21 DIAGNOSIS — R69 Illness, unspecified: Secondary | ICD-10-CM | POA: Insufficient documentation

## 2023-06-21 DIAGNOSIS — Z7901 Long term (current) use of anticoagulants: Secondary | ICD-10-CM | POA: Diagnosis not present

## 2023-06-21 DIAGNOSIS — Z9981 Dependence on supplemental oxygen: Secondary | ICD-10-CM | POA: Insufficient documentation

## 2023-06-21 DIAGNOSIS — Z7982 Long term (current) use of aspirin: Secondary | ICD-10-CM | POA: Diagnosis not present

## 2023-06-21 DIAGNOSIS — Z59811 Housing instability, housed, with risk of homelessness: Secondary | ICD-10-CM | POA: Diagnosis not present

## 2023-06-21 DIAGNOSIS — I4892 Unspecified atrial flutter: Secondary | ICD-10-CM | POA: Diagnosis not present

## 2023-06-21 DIAGNOSIS — Z952 Presence of prosthetic heart valve: Secondary | ICD-10-CM | POA: Insufficient documentation

## 2023-06-21 DIAGNOSIS — R059 Cough, unspecified: Secondary | ICD-10-CM | POA: Diagnosis present

## 2023-06-21 DIAGNOSIS — R9431 Abnormal electrocardiogram [ECG] [EKG]: Secondary | ICD-10-CM | POA: Insufficient documentation

## 2023-06-21 DIAGNOSIS — J181 Lobar pneumonia, unspecified organism: Secondary | ICD-10-CM | POA: Diagnosis not present

## 2023-06-21 DIAGNOSIS — J189 Pneumonia, unspecified organism: Secondary | ICD-10-CM

## 2023-06-21 LAB — BRAIN NATRIURETIC PEPTIDE: B Natriuretic Peptide: 208.4 pg/mL — ABNORMAL HIGH (ref 0.0–100.0)

## 2023-06-21 LAB — COMPREHENSIVE METABOLIC PANEL
ALT: 13 U/L (ref 0–44)
AST: 17 U/L (ref 15–41)
Albumin: 3 g/dL — ABNORMAL LOW (ref 3.5–5.0)
Alkaline Phosphatase: 158 U/L — ABNORMAL HIGH (ref 38–126)
Anion gap: 11 (ref 5–15)
BUN: 14 mg/dL (ref 8–23)
CO2: 22 mmol/L (ref 22–32)
Calcium: 8.5 mg/dL — ABNORMAL LOW (ref 8.9–10.3)
Chloride: 102 mmol/L (ref 98–111)
Creatinine, Ser: 1.09 mg/dL — ABNORMAL HIGH (ref 0.44–1.00)
GFR, Estimated: 56 mL/min — ABNORMAL LOW (ref >60)
Glucose, Bld: 140 mg/dL — ABNORMAL HIGH (ref 70–99)
Potassium: 3.7 mmol/L (ref 3.5–5.1)
Sodium: 135 mmol/L (ref 135–145)
Total Bilirubin: 0.3 mg/dL (ref 0.0–1.2)
Total Protein: 6.4 g/dL — ABNORMAL LOW (ref 6.5–8.1)

## 2023-06-21 LAB — CBC
HCT: 31.7 % — ABNORMAL LOW (ref 36.0–46.0)
Hemoglobin: 9.9 g/dL — ABNORMAL LOW (ref 12.0–15.0)
MCH: 25.6 pg — ABNORMAL LOW (ref 26.0–34.0)
MCHC: 31.2 g/dL (ref 30.0–36.0)
MCV: 82.1 fL (ref 80.0–100.0)
Platelets: 229 10*3/uL (ref 150–400)
RBC: 3.86 MIL/uL — ABNORMAL LOW (ref 3.87–5.11)
RDW: 15.2 % (ref 11.5–15.5)
WBC: 6.1 10*3/uL (ref 4.0–10.5)
nRBC: 0 % (ref 0.0–0.2)

## 2023-06-21 LAB — TROPONIN I (HIGH SENSITIVITY)
Troponin I (High Sensitivity): 12 ng/L (ref <18)
Troponin I (High Sensitivity): 12 ng/L (ref <18)

## 2023-06-21 MED ORDER — AMOXICILLIN-POT CLAVULANATE 875-125 MG PO TABS
1.0000 | ORAL_TABLET | Freq: Once | ORAL | Status: DC
  Filled 2023-06-21: qty 1

## 2023-06-21 MED ORDER — GABAPENTIN 300 MG PO CAPS
800.0000 mg | ORAL_CAPSULE | Freq: Once | ORAL | Status: AC
  Administered 2023-06-22: 800 mg via ORAL
  Filled 2023-06-21: qty 2

## 2023-06-21 MED ORDER — CLONAZEPAM 0.5 MG PO TABS
1.0000 mg | ORAL_TABLET | Freq: Once | ORAL | Status: AC
  Administered 2023-06-22: 1 mg via ORAL
  Filled 2023-06-21: qty 2

## 2023-06-21 MED ORDER — METFORMIN HCL 500 MG PO TABS
500.0000 mg | ORAL_TABLET | Freq: Once | ORAL | Status: AC
  Administered 2023-06-22: 500 mg via ORAL
  Filled 2023-06-21: qty 1

## 2023-06-21 NOTE — ED Triage Notes (Signed)
Chest pressure x 3 days with productive cough, increased weakness and fatigue. Dec 13, CABG and valve replacement. EKG shows a flutter with EMS. PT took 324mg  Aspirin prior to calling 911. EMS gave 1 nitroglycerin SL with mild relief. On chronic O2 at 2LPM [place].  EMS VS:  BP 118/78 HR 80-110 96% on 2LPM [place] CBG 236

## 2023-06-21 NOTE — ED Provider Notes (Signed)
[place] EMERGENCY DEPARTMENT AT [facility] Provider Note   CSN: [id number] Arrival date & time: 06/21/23  1825     History  Chief Complaint  Patient presents with   Chest Pain    Rose [name] is a 68 y.o. female.  HPI Patient reports she has had a productive cough for about 3 days.  She reports she is also been having some sharp pains in the left side of her chest.  They come and they go.  She reports since being sick she has had some increased general fatigue and weakness.  Patient did have mitral valve replacement on December 13 by Dr. Leafy Ro.  She was she was doing well but then in the past few days started to get a cough and some sharp chest pains.  She been wearing 2 L nasal cannula oxygen at baseline.  She denies any swelling or pain in her legs or calves.  No documented fever.  Patient reports that she continues to take Coumadin.  Review of EMR shows values are ranging from 1.3-2.6.    Home Medications Prior to Admission medications   Medication Sig Start Date End Date Taking? Authorizing Provider  acetaminophen (TYLENOL) 325 MG tablet Take 2 tablets (650 mg total) by mouth every 4 (four) hours as needed. Patient not taking: Reported on 05/28/2023 05/22/23 05/21/24  Jenny Reichmann, PA-C  antiseptic oral rinse (CORINZ) solution 7 mLs by Mouth Rinse route as needed.    [provider]  aspirin EC 81 MG tablet Take 1 tablet (81 mg total) by mouth daily. Swallow whole. Patient not taking: Reported on 05/28/2023 05/06/21   Tolia, Sunit, DO  clonazePAM (KLONOPIN) 1 MG tablet Take 1 mg by mouth 3 (three) times daily.    [provider]  Fluticasone-Umeclidin-Vilant (TRELEGY ELLIPTA) 200-62.5-25 MCG/ACT AEPB Inhale 200 mcg into the lungs daily.    [provider]  furosemide (LASIX) 40 MG tablet Take 1 tablet (40 mg total) by mouth daily. Hold if SBP is less than 105 05/31/23   Stehler, Fredric Mare C, PA-C  gabapentin (NEURONTIN) 800 MG tablet  Take 800 mg by mouth 3 (three) times daily.    [provider]  magic mouthwash SOLN Take 10 mLs by mouth 3 (three) times daily as needed for mouth pain. 05/22/23   Stehler, Oren Bracket, PA-C  metFORMIN (GLUCOPHAGE) 500 MG tablet Take 500 mg by mouth 2 (two) times daily. 03/05/19   [provider]  methocarbamol (ROBAXIN) 500 MG tablet Take 1 tablet (500 mg total) by mouth every 8 (eight) hours as needed for muscle spasms. Patient not taking: Reported on 05/28/2023 03/14/22   Benjiman Core, MD  midodrine (PROAMATINE) 10 MG tablet Take 1 tablet (10 mg total) by mouth 3 (three) times daily with meals. 05/22/23   Stehler, Oren Bracket, PA-C  nicotine (NICODERM CQ - DOSED IN MG/24 HOURS) 21 mg/24hr patch Place 1 patch (21 mg total) onto the skin daily. 05/22/23   Stehler, Oren Bracket, PA-C  omeprazole (PRILOSEC) 40 MG capsule Take 40 mg by mouth daily. 01/23/22   [provider]  ondansetron (ZOFRAN) 4 MG tablet Take 4 mg by mouth every 8 (eight) hours as needed for nausea or vomiting.    [provider]  Pancrelipase, Lip-Prot-Amyl, (CREON) 24000-76000 units CPEP Take 1 capsule by mouth 3 (three) times daily after meals.    [provider]  potassium chloride SA (KLOR-CON M) 20 MEQ tablet Take 1 tablet (20 mEq total) by mouth  daily. 05/31/23   Stehler, Oren Bracket, PA-C  rOPINIRole (REQUIP) 1 MG tablet Take 1 mg by mouth at bedtime.    [provider]  rosuvastatin (CRESTOR) 40 MG tablet Take 1 tablet (40 mg total) by mouth at bedtime. 05/22/23 08/20/23  Stehler, Oren Bracket, PA-C  sertraline (ZOLOFT) 50 MG tablet Take 50 mg by mouth every morning. 01/11/23   [provider]  traMADol (ULTRAM) 50 MG tablet Take 1 tablet (50 mg total) by mouth every 6 (six) hours as needed. 05/31/23   Stehler, Oren Bracket, PA-C  traZODone (DESYREL) 50 MG tablet Take 150 mg by mouth at bedtime.    [provider]  warfarin (COUMADIN) 2.5 MG tablet Take 1-2 tablets Daily or  as prescribed by [facility] 06/15/23   Jake Bathe, MD      Allergies    Erythromycin, Nickel, Oxycodone, Meperidine, Codeine, Hydrocodone-acetaminophen, Morphine, Propoxyphene, and Quetiapine    Review of Systems   Review of Systems  Physical Exam Updated Vital Signs BP (!) 120/54   Pulse (!) 50   Temp 97.9 F (36.6 C) (Oral)   Resp 19   Ht 5\' 3"  (1.6 m)   Wt 89.4 kg   SpO2 98%   BMI 34.89 kg/m  Physical Exam Constitutional:      Comments: Patient is alert.  Nontoxic.  No acute distress.  HENT:     Mouth/Throat:     Pharynx: Oropharynx is clear.  Eyes:     Extraocular Movements: Extraocular movements intact.  Cardiovascular:     Rate and Rhythm: Normal rate. Rhythm irregular.  Pulmonary:     Comments: Occasional expiratory wheeze.  No respiratory distress at rest.  Intermittent cough. Abdominal:     General: There is no distension.     Palpations: Abdomen is soft.     Tenderness: There is no abdominal tenderness. There is no guarding.  Musculoskeletal:        General: No swelling or tenderness. Normal range of motion.     Right lower leg: No edema.     Left lower leg: No edema.     Comments: No lower extremity edema or calf tenderness.  Skin:    General: Skin is warm and dry.  Neurological:     General: No focal deficit present.     Mental Status: She is oriented to person, place, and time.     Motor: No weakness.     Coordination: Coordination normal.  Psychiatric:        Mood and Affect: Mood normal.     ED Results / Procedures / Treatments   Labs (all labs ordered are listed, but only abnormal results are displayed) Labs Reviewed  CBC - Abnormal; Notable for the following components:      Result Value   RBC 3.86 (*)    Hemoglobin 9.9 (*)    HCT 31.7 (*)    MCH 25.6 (*)    All other components within normal limits  COMPREHENSIVE METABOLIC PANEL - Abnormal; Notable for the following components:   Glucose, Bld 140 (*)    Creatinine, Ser 1.09  (*)    Calcium 8.5 (*)    Total Protein 6.4 (*)    Albumin 3.0 (*)    Alkaline Phosphatase 158 (*)    GFR, Estimated 56 (*)    All other components within normal limits  BRAIN NATRIURETIC PEPTIDE - Abnormal; Notable for the following components:   B Natriuretic Peptide 208.4 (*)    All  other components within normal limits  PROTIME-INR  TROPONIN I (HIGH SENSITIVITY)  TROPONIN I (HIGH SENSITIVITY)    EKG EKG Interpretation Date/Time:  Thursday June 21 2023 18:33:36 EST Ventricular Rate:  87 PR Interval:    QRS Duration:  76 QT Interval:  364 QTC Calculation: 438 R Axis:   29  Text Interpretation: Atrial flutter with variable A-V block ST & T wave abnormality, consider lateral ischemia Abnormal ECG When compared with ECG of 12-May-2023 06:48, PREVIOUS ECG IS PRESENT Confirmed by Arby Barrette [phone]) on 06/21/2023 7:46:49 PM  Radiology DG Chest Port 1 View Result Date: 06/21/2023 CLINICAL DATA:  Chest pressure for 3 days, cough, weakness EXAM: PORTABLE CHEST 1 VIEW COMPARISON:  05/20/2023 FINDINGS: Single frontal view of the chest demonstrates an unremarkable cardiac silhouette. Linear consolidation at the right lung base consistent with atelectasis or scarring, stable. There is new patchy consolidation at the left lung base, which could reflect hypoventilatory change or pneumonia. No effusion or pneumothorax. No acute bony abnormalities. IMPRESSION: 1. New patchy left basilar consolidation, which may reflect pneumonia or hypoventilatory change. Electronically Signed   By: Sharlet Salina M.D.   On: 06/21/2023 21:40    Procedures Procedures    Medications Ordered in ED Medications  clonazePAM (KLONOPIN) tablet 1 mg (has no administration in time range)  gabapentin (NEURONTIN) capsule 800 mg (has no administration in time range)  metFORMIN (GLUCOPHAGE) tablet 500 mg (has no administration in time range)  clonazePAM (KLONOPIN) tablet 1 mg (has no administration in time range)   gabapentin (NEURONTIN) capsule 800 mg (has no administration in time range)  rosuvastatin (CRESTOR) tablet 40 mg (has no administration in time range)  warfarin (COUMADIN) tablet 2.5 mg (has no administration in time range)  metFORMIN (GLUCOPHAGE) tablet 500 mg (has no administration in time range)  amoxicillin-clavulanate (AUGMENTIN) 875-125 MG per tablet 1 tablet (has no administration in time range)    ED Course/ Medical Decision Making/ A&P                                 Medical Decision Making Amount and/or Complexity of Data Reviewed Labs: ordered. Radiology: ordered.  Risk Prescription drug management.   Patient presents as outlined.  She has recent mitral valve replacement in mid December.  She has been having cough productive for several days.  Patient is not febrile.  She is not hypotensive.  She does not have respiratory distress at rest and at baseline is on 2 L oxygen.  Will proceed with broad diagnostic evaluation to include evaluation for pneumonia\CHF\PE\musculoskeletal chest pain.  EKG reviewed by myself atrial flutter rate controlled at 87.  Comparison prior EKG had atrial fibrillation with same QRS morphology.  No acute ischemic appearance.  Troponin is flat at 12.  At this time lower suspicion for ACS.  Patient does not have changes and flat troponin.  Patient had cardiac catheterization 11/16/2022 with patent coronary arteries with mild nonobstructive plaque in the RCA no stenosis in left main, LAD or left circumflex.  I have lower suspicion for pulmonary embolus.  Patient does not have lower extremity swelling or calf pain, she is chronically anticoagulated on Coumadin, symptoms are not typical with productive cough as main feature.  At this time, I do not feel that PE study is indicated for PE with low probability.  Chest x-ray interpreted radiology does show left lower lobe consolidation.  This does correspond with area of some  pleuritic chest pain and cough.  I  suspect this is the etiology for patient's symptoms.  Will opt to treat with Augmentin.  At baseline patient has 2 L nasal cannula oxygen.  She is oxygenating at high 90s to 100% not requiring additional O2 support.  Patient has prior history of atrial fibrillation and is anticoagulated with Coumadin, she is rate controlled in the 70s to 90s.  At this time I did feel patient was stable to continue outpatient management with her ongoing medications and close follow-up.  Patient reports she does have follow-up tomorrow.  However she also reports that she has nowhere to go and is being evicted in the morning.  She reports she cannot get into her apartment.  By review of EMR this has been an ongoing problem.  At this time we will consult TOC for assistance in patient living arrangements.  I have continued her daily medications for now and add Augmentin.         Final Clinical Impression(s) / ED Diagnoses Final diagnoses:  Pneumonia of left lower lobe due to infectious organism  Severe comorbid illness  Housing instability due to imminent risk of homelessness    Rx / DC Orders ED Discharge Orders     None         Arby Barrette, MD 06/22/23 [phone]

## 2023-06-22 ENCOUNTER — Encounter (HOSPITAL_COMMUNITY): Payer: Self-pay

## 2023-06-22 ENCOUNTER — Other Ambulatory Visit (HOSPITAL_COMMUNITY): Payer: 59

## 2023-06-22 ENCOUNTER — Ambulatory Visit: Payer: 59

## 2023-06-22 ENCOUNTER — Telehealth (HOSPITAL_COMMUNITY): Payer: Self-pay | Admitting: Licensed Clinical Social Worker

## 2023-06-22 DIAGNOSIS — J181 Lobar pneumonia, unspecified organism: Secondary | ICD-10-CM | POA: Diagnosis not present

## 2023-06-22 LAB — PROTIME-INR
INR: 1.5 — ABNORMAL HIGH (ref 0.8–1.2)
INR: 1.8 — ABNORMAL HIGH (ref 0.8–1.2)
Prothrombin Time: 18.3 s — ABNORMAL HIGH (ref 11.4–15.2)
Prothrombin Time: 20.7 s — ABNORMAL HIGH (ref 11.4–15.2)

## 2023-06-22 MED ORDER — WARFARIN SODIUM 2.5 MG PO TABS: 2.5000 mg | ORAL_TABLET | Freq: Every day | ORAL | Status: DC

## 2023-06-22 MED ORDER — ROSUVASTATIN CALCIUM 5 MG PO TABS
40.0000 mg | ORAL_TABLET | Freq: Every day | ORAL | Status: DC
  Administered 2023-06-22: 40 mg via ORAL
  Filled 2023-06-22: qty 2

## 2023-06-22 MED ORDER — AMOXICILLIN-POT CLAVULANATE 875-125 MG PO TABS
1.0000 | ORAL_TABLET | Freq: Two times a day (BID) | ORAL | Status: DC
  Administered 2023-06-22 (×2): 1 via ORAL
  Filled 2023-06-22: qty 1

## 2023-06-22 MED ORDER — AMOXICILLIN-POT CLAVULANATE 875-125 MG PO TABS
1.0000 | ORAL_TABLET | Freq: Two times a day (BID) | ORAL | 0 refills | Status: DC
Start: 2023-06-22 — End: 2023-07-03

## 2023-06-22 MED ORDER — TRAZODONE HCL 50 MG PO TABS
50.0000 mg | ORAL_TABLET | Freq: Every day | ORAL | Status: DC
  Administered 2023-06-22 (×2): 50 mg via ORAL
  Filled 2023-06-22: qty 1

## 2023-06-22 MED ORDER — WARFARIN SODIUM 5 MG PO TABS
5.0000 mg | ORAL_TABLET | Freq: Once | ORAL | Status: DC
  Filled 2023-06-22: qty 1

## 2023-06-22 MED ORDER — TRAZODONE HCL 100 MG PO TABS: 100.0000 mg | ORAL_TABLET | Freq: Every day | ORAL | Status: DC

## 2023-06-22 MED ORDER — CLONAZEPAM 0.5 MG PO TABS
1.0000 mg | ORAL_TABLET | Freq: Two times a day (BID) | ORAL | Status: DC | PRN
  Filled 2023-06-22: qty 2

## 2023-06-22 MED ORDER — WARFARIN - PHARMACIST DOSING INPATIENT: Freq: Every day | Status: DC

## 2023-06-22 MED ORDER — CLONAZEPAM 0.5 MG PO TABS
1.0000 mg | ORAL_TABLET | Freq: Once | ORAL | Status: AC
  Administered 2023-06-22: 1 mg via ORAL

## 2023-06-22 MED ORDER — GABAPENTIN 400 MG PO CAPS: 800.0000 mg | ORAL_CAPSULE | Freq: Three times a day (TID) | ORAL | Status: DC

## 2023-06-22 MED ORDER — METFORMIN HCL 500 MG PO TABS
500.0000 mg | ORAL_TABLET | Freq: Two times a day (BID) | ORAL | Status: DC
Start: 2023-06-23 — End: 2023-06-22

## 2023-06-22 MED ORDER — ENOXAPARIN SODIUM 150 MG/ML IJ SOSY
1.5000 mg/kg | PREFILLED_SYRINGE | Freq: Once | INTRAMUSCULAR | Status: AC
  Administered 2023-06-22: 135 mg via SUBCUTANEOUS
  Filled 2023-06-22: qty 0.9

## 2023-06-22 MED ORDER — AMOXICILLIN-POT CLAVULANATE 875-125 MG PO TABS: 1.0000 | ORAL_TABLET | Freq: Two times a day (BID) | ORAL | Status: DC

## 2023-06-22 NOTE — ED Provider Notes (Signed)
Patient reports she can now go to a hotel.  She has outpatient social work to work with as well.  Her prescription was sent to her pharmacy per her request   Gwyneth Sprout, MD 06/22/23 1440

## 2023-06-22 NOTE — Telephone Encounter (Signed)
H&V Care Navigation CSW Progress Note  Clinical Social Worker received call from inpatient team regarding pt being in ED- updated on current housing situation.  CSW touched base with Joni Reining patients [employer] case worker who reports that apartment has not been padlocked yet but that the patient and sister decided to go ahead and move out since padlock due in next couple of days- TCM team has now applied for additional housing options based on patient being homeless.  CSW sent official referral for [facility] program for potential financial assistance with hotel stay.  Will continue to follow and assist as needed  SDOH Screenings   Food Insecurity: No Food Insecurity (05/13/2023)  Housing: High Risk (06/11/2023)  Transportation Needs: No Transportation Needs (05/11/2023)  Utilities: At Risk (05/11/2023)  Alcohol Screen: Low Risk  (12/13/2018)  Financial Resource Strain: Medium Risk (01/25/2023)  Social Connections: Unknown (05/17/2022)   Received from [facility], [facility]  Tobacco Use: High Risk (05/31/2023)   Burna Sis, LCSW Clinical Social Worker [facility] Desk#: [phone] Cell#: [phone]

## 2023-06-22 NOTE — Discharge Instructions (Addendum)
1.  At this time your chest x-ray shows a pneumonia.  You have had a recent cough.  You have had some pain on the left side of her chest with corresponds to location of pneumonia.  You are to take Augmentin twice a day as prescribed. 2.  You reported you have a follow-up appointment with one of your doctors on the following day.  You should follow-up with soon as possible for recheck.  You need continued monitoring of your underlying medical conditions as well as monitoring for response to treatment.  Please call Joni Reining, case manager at [facility] @ [phone] to follow up regarding plan for housing.

## 2023-06-22 NOTE — Progress Notes (Addendum)
CSW received call from RN regarding patient's housing instability.  CSW reviewed chart and spoke with Eileen Stanford, LCSW of the Heart & Vascular team to discuss patient. Per Eileen Stanford, patient resides in an apartment with her sister and is at risk for getting evicted and the apartment being padlocked. Per Eileen Stanford, patient has a tailored care manager at [facility] that is working to find shelter for patient after she is evicted from her current apartment. Per Eileen Stanford, patient can return to the apartment today and should follow up with [facility] and [facility] staff Joni Reining @ [phone]) to further discuss her housing concerns.  CSW notified RN of information. CSW added shelter resources to patient's AVS.  Edwin Dada, MSW, LCSW Transitions of Care  Clinical Social Worker II [phone]

## 2023-06-22 NOTE — ED Notes (Signed)
PT discharged from [facility] by csw and physician.Taxi cab voucher  offered to PT by [facility] nd PT being discharged to [address] In town suites.

## 2023-06-22 NOTE — ED Notes (Signed)
PT is currently resting.

## 2023-06-22 NOTE — Progress Notes (Signed)
PHARMACY - ANTICOAGULATION CONSULT NOTE  Pharmacy Consult for warfarin Indication: MVR  Labs: Recent Labs    06/21/23 1834 06/21/23 2120  HGB 9.9*  --   HCT 31.7*  --   PLT 229  --   CREATININE 1.09*  --   TROPONINIHS 12 12    Estimated Creatinine Clearance: 53.9 mL/min (A) (by C-G formula based on SCr of 1.09 mg/dL (H)).  Assessment: Rose [name] is a 68 y.o. year old female presented on 06/21/2023 currently holding in ED for placement. On warfarin prior to admission for MVR. Prior to admission regimen, 5 mg every Mon, Wed, Fri; 2.5 mg all other days. Noted to have subtherapeutic INR at last several outpatient visits. Last dose prior to admission 1/23 AM. Pharmacy consulted for warfarin inpatient management.   Date INR Warfarin Dose  1/24 1.5, subtherapeutic  5mg  (planned)      Goal of Therapy:  INR goal 2.5-3.0 Monitor platelets by anticoagulation protocol: Yes   Plan:  Enoxaparin 135mg  (1.5mg /kg) x 1 with subtherapeutic INR Warfarin 5mg  x 1  F/u INR and daily warfarin F/u plans for discharge for recommendation for anticoagulation   Thank you for allowing pharmacy to participate in this patient's care.  Marja Kays, PharmD Emergency Medicine Clinical Pharmacist 06/22/2023,3:01 AM

## 2023-06-27 ENCOUNTER — Telehealth: Payer: Self-pay | Admitting: *Deleted

## 2023-06-27 NOTE — Telephone Encounter (Signed)
Patient contacted the office requesting a refill of pain medication. States she feels pain in her chest and rib area when she coughs. States she has recently been diagnosed with pneumonia. Denies issues with incisions. States she has been out of Tramadol for a while now and has not been taking pain medication. States Tramadol never helped with pain. Advised patient to contact PCP for pain medication r/t recent cough. Patient verbalized understanding.

## 2023-06-30 ENCOUNTER — Inpatient Hospital Stay (HOSPITAL_COMMUNITY)
Admission: EM | Admit: 2023-06-30 | Discharge: 2023-07-03 | DRG: 308 | Disposition: A | Discharge status: Home / Self Care | Payer: 59 | Attending: Internal Medicine | Admitting: Internal Medicine

## 2023-06-30 ENCOUNTER — Emergency Department (HOSPITAL_COMMUNITY): Payer: 59

## 2023-06-30 ENCOUNTER — Observation Stay (HOSPITAL_COMMUNITY): Payer: 59

## 2023-06-30 ENCOUNTER — Other Ambulatory Visit: Payer: Self-pay

## 2023-06-30 DIAGNOSIS — Z7951 Long term (current) use of inhaled steroids: Secondary | ICD-10-CM

## 2023-06-30 DIAGNOSIS — I4819 Other persistent atrial fibrillation: Secondary | ICD-10-CM | POA: Diagnosis not present

## 2023-06-30 DIAGNOSIS — Z952 Presence of prosthetic heart valve: Secondary | ICD-10-CM

## 2023-06-30 DIAGNOSIS — D509 Iron deficiency anemia, unspecified: Secondary | ICD-10-CM | POA: Diagnosis present

## 2023-06-30 DIAGNOSIS — I2510 Atherosclerotic heart disease of native coronary artery without angina pectoris: Secondary | ICD-10-CM | POA: Diagnosis present

## 2023-06-30 DIAGNOSIS — E66811 Obesity, class 1: Secondary | ICD-10-CM | POA: Diagnosis present

## 2023-06-30 DIAGNOSIS — Z953 Presence of xenogenic heart valve: Secondary | ICD-10-CM

## 2023-06-30 DIAGNOSIS — Z7985 Long-term (current) use of injectable non-insulin antidiabetic drugs: Secondary | ICD-10-CM

## 2023-06-30 DIAGNOSIS — Z9049 Acquired absence of other specified parts of digestive tract: Secondary | ICD-10-CM

## 2023-06-30 DIAGNOSIS — K219 Gastro-esophageal reflux disease without esophagitis: Secondary | ICD-10-CM | POA: Diagnosis present

## 2023-06-30 DIAGNOSIS — I509 Heart failure, unspecified: Secondary | ICD-10-CM

## 2023-06-30 DIAGNOSIS — Z881 Allergy status to other antibiotic agents status: Secondary | ICD-10-CM

## 2023-06-30 DIAGNOSIS — E7800 Pure hypercholesterolemia, unspecified: Secondary | ICD-10-CM | POA: Diagnosis present

## 2023-06-30 DIAGNOSIS — Z825 Family history of asthma and other chronic lower respiratory diseases: Secondary | ICD-10-CM

## 2023-06-30 DIAGNOSIS — K760 Fatty (change of) liver, not elsewhere classified: Secondary | ICD-10-CM | POA: Diagnosis present

## 2023-06-30 DIAGNOSIS — Z6835 Body mass index (BMI) 35.0-35.9, adult: Secondary | ICD-10-CM

## 2023-06-30 DIAGNOSIS — E119 Type 2 diabetes mellitus without complications: Secondary | ICD-10-CM

## 2023-06-30 DIAGNOSIS — Z8701 Personal history of pneumonia (recurrent): Secondary | ICD-10-CM

## 2023-06-30 DIAGNOSIS — J9611 Chronic respiratory failure with hypoxia: Secondary | ICD-10-CM | POA: Diagnosis present

## 2023-06-30 DIAGNOSIS — Z720 Tobacco use: Secondary | ICD-10-CM | POA: Diagnosis present

## 2023-06-30 DIAGNOSIS — F17210 Nicotine dependence, cigarettes, uncomplicated: Secondary | ICD-10-CM | POA: Diagnosis present

## 2023-06-30 DIAGNOSIS — I5033 Acute on chronic diastolic (congestive) heart failure: Secondary | ICD-10-CM | POA: Diagnosis not present

## 2023-06-30 DIAGNOSIS — G4733 Obstructive sleep apnea (adult) (pediatric): Secondary | ICD-10-CM | POA: Diagnosis present

## 2023-06-30 DIAGNOSIS — G2581 Restless legs syndrome: Secondary | ICD-10-CM | POA: Diagnosis present

## 2023-06-30 DIAGNOSIS — Z885 Allergy status to narcotic agent status: Secondary | ICD-10-CM

## 2023-06-30 DIAGNOSIS — Z8249 Family history of ischemic heart disease and other diseases of the circulatory system: Secondary | ICD-10-CM

## 2023-06-30 DIAGNOSIS — Z7984 Long term (current) use of oral hypoglycemic drugs: Secondary | ICD-10-CM

## 2023-06-30 DIAGNOSIS — I700 Atherosclerosis of aorta: Secondary | ICD-10-CM | POA: Diagnosis present

## 2023-06-30 DIAGNOSIS — D638 Anemia in other chronic diseases classified elsewhere: Secondary | ICD-10-CM | POA: Diagnosis present

## 2023-06-30 DIAGNOSIS — Z1152 Encounter for screening for COVID-19: Secondary | ICD-10-CM

## 2023-06-30 DIAGNOSIS — R079 Chest pain, unspecified: Secondary | ICD-10-CM | POA: Diagnosis not present

## 2023-06-30 DIAGNOSIS — I483 Typical atrial flutter: Secondary | ICD-10-CM | POA: Diagnosis not present

## 2023-06-30 DIAGNOSIS — Z9981 Dependence on supplemental oxygen: Secondary | ICD-10-CM

## 2023-06-30 DIAGNOSIS — Z90710 Acquired absence of both cervix and uterus: Secondary | ICD-10-CM

## 2023-06-30 DIAGNOSIS — I5043 Acute on chronic combined systolic (congestive) and diastolic (congestive) heart failure: Secondary | ICD-10-CM | POA: Diagnosis present

## 2023-06-30 DIAGNOSIS — I959 Hypotension, unspecified: Secondary | ICD-10-CM | POA: Diagnosis present

## 2023-06-30 DIAGNOSIS — Z86718 Personal history of other venous thrombosis and embolism: Secondary | ICD-10-CM

## 2023-06-30 DIAGNOSIS — J441 Chronic obstructive pulmonary disease with (acute) exacerbation: Secondary | ICD-10-CM | POA: Diagnosis not present

## 2023-06-30 DIAGNOSIS — Z79899 Other long term (current) drug therapy: Secondary | ICD-10-CM

## 2023-06-30 DIAGNOSIS — I272 Pulmonary hypertension, unspecified: Secondary | ICD-10-CM | POA: Diagnosis present

## 2023-06-30 DIAGNOSIS — Z8673 Personal history of transient ischemic attack (TIA), and cerebral infarction without residual deficits: Secondary | ICD-10-CM

## 2023-06-30 DIAGNOSIS — Z884 Allergy status to anesthetic agent status: Secondary | ICD-10-CM

## 2023-06-30 DIAGNOSIS — I071 Rheumatic tricuspid insufficiency: Secondary | ICD-10-CM | POA: Diagnosis present

## 2023-06-30 DIAGNOSIS — Z888 Allergy status to other drugs, medicaments and biological substances status: Secondary | ICD-10-CM

## 2023-06-30 DIAGNOSIS — I480 Paroxysmal atrial fibrillation: Secondary | ICD-10-CM | POA: Diagnosis not present

## 2023-06-30 DIAGNOSIS — Z7901 Long term (current) use of anticoagulants: Secondary | ICD-10-CM

## 2023-06-30 LAB — CBG MONITORING, ED
Glucose-Capillary: 119 mg/dL — ABNORMAL HIGH (ref 70–99)
Glucose-Capillary: 193 mg/dL — ABNORMAL HIGH (ref 70–99)
Glucose-Capillary: 228 mg/dL — ABNORMAL HIGH (ref 70–99)

## 2023-06-30 LAB — COMPREHENSIVE METABOLIC PANEL
ALT: 13 U/L (ref 0–44)
AST: 16 U/L (ref 15–41)
Albumin: 3 g/dL — ABNORMAL LOW (ref 3.5–5.0)
Alkaline Phosphatase: 164 U/L — ABNORMAL HIGH (ref 38–126)
Anion gap: 12 (ref 5–15)
BUN: 8 mg/dL (ref 8–23)
CO2: 24 mmol/L (ref 22–32)
Calcium: 8.9 mg/dL (ref 8.9–10.3)
Chloride: 103 mmol/L (ref 98–111)
Creatinine, Ser: 1.01 mg/dL — ABNORMAL HIGH (ref 0.44–1.00)
GFR, Estimated: >60 mL/min (ref >60)
Glucose, Bld: 125 mg/dL — ABNORMAL HIGH (ref 70–99)
Potassium: 3.9 mmol/L (ref 3.5–5.1)
Sodium: 139 mmol/L (ref 135–145)
Total Bilirubin: 0.4 mg/dL (ref 0.0–1.2)
Total Protein: 6.5 g/dL (ref 6.5–8.1)

## 2023-06-30 LAB — PROCALCITONIN: Procalcitonin: <0.1 ng/mL

## 2023-06-30 LAB — CBC
HCT: 29.7 % — ABNORMAL LOW (ref 36.0–46.0)
Hemoglobin: 9.3 g/dL — ABNORMAL LOW (ref 12.0–15.0)
MCH: 25.3 pg — ABNORMAL LOW (ref 26.0–34.0)
MCHC: 31.3 g/dL (ref 30.0–36.0)
MCV: 80.7 fL (ref 80.0–100.0)
Platelets: 224 10*3/uL (ref 150–400)
RBC: 3.68 MIL/uL — ABNORMAL LOW (ref 3.87–5.11)
RDW: 15.5 % (ref 11.5–15.5)
WBC: 6.9 10*3/uL (ref 4.0–10.5)
nRBC: 0 % (ref 0.0–0.2)

## 2023-06-30 LAB — IRON AND TIBC
Iron: 29 ug/dL (ref 28–170)
Saturation Ratios: 6 % — ABNORMAL LOW (ref 10.4–31.8)
TIBC: 455 ug/dL — ABNORMAL HIGH (ref 250–450)
UIBC: 426 ug/dL

## 2023-06-30 LAB — URINALYSIS, COMPLETE (UACMP) WITH MICROSCOPIC
Bacteria, UA: NONE SEEN
Bilirubin Urine: NEGATIVE
Glucose, UA: NEGATIVE mg/dL
Hgb urine dipstick: NEGATIVE
Ketones, ur: NEGATIVE mg/dL
Leukocytes,Ua: NEGATIVE
Nitrite: NEGATIVE
Protein, ur: NEGATIVE mg/dL
Specific Gravity, Urine: 1.015 (ref 1.005–1.030)
pH: 5 (ref 5.0–8.0)

## 2023-06-30 LAB — BLOOD GAS, VENOUS
Acid-Base Excess: 2.2 mmol/L — ABNORMAL HIGH (ref 0.0–2.0)
Bicarbonate: 28.3 mmol/L — ABNORMAL HIGH (ref 20.0–28.0)
O2 Saturation: 66.9 %
Patient temperature: 36.8
pCO2, Ven: 49 mm[Hg] (ref 44–60)
pH, Ven: 7.37 (ref 7.25–7.43)
pO2, Ven: 38 mm[Hg] (ref 32–45)

## 2023-06-30 LAB — RETICULOCYTES
Immature Retic Fract: 26.3 % — ABNORMAL HIGH (ref 2.3–15.9)
RBC.: 3.71 MIL/uL — ABNORMAL LOW (ref 3.87–5.11)
Retic Count, Absolute: 72.3 10*3/uL (ref 19.0–186.0)
Retic Ct Pct: 2 % (ref 0.4–3.1)

## 2023-06-30 LAB — FERRITIN: Ferritin: 17 ng/mL (ref 11–307)

## 2023-06-30 LAB — RESP PANEL BY RT-PCR (RSV, FLU A&B, COVID)  RVPGX2
Influenza A by PCR: NEGATIVE
Influenza B by PCR: NEGATIVE
Resp Syncytial Virus by PCR: NEGATIVE
SARS Coronavirus 2 by RT PCR: NEGATIVE

## 2023-06-30 LAB — PROTIME-INR
INR: 1.3 — ABNORMAL HIGH (ref 0.8–1.2)
Prothrombin Time: 15.9 s — ABNORMAL HIGH (ref 11.4–15.2)

## 2023-06-30 LAB — HEPATIC FUNCTION PANEL
ALT: 12 U/L (ref 0–44)
AST: 16 U/L (ref 15–41)
Albumin: 3 g/dL — ABNORMAL LOW (ref 3.5–5.0)
Alkaline Phosphatase: 164 U/L — ABNORMAL HIGH (ref 38–126)
Bilirubin, Direct: <0.1 mg/dL (ref 0.0–0.2)
Total Bilirubin: 0.3 mg/dL (ref 0.0–1.2)
Total Protein: 6.6 g/dL (ref 6.5–8.1)

## 2023-06-30 LAB — MAGNESIUM: Magnesium: 1.9 mg/dL (ref 1.7–2.4)

## 2023-06-30 LAB — TSH: TSH: 1.51 u[IU]/mL (ref 0.350–4.500)

## 2023-06-30 LAB — RAPID URINE DRUG SCREEN, HOSP PERFORMED
Amphetamines: NOT DETECTED
Barbiturates: NOT DETECTED
Benzodiazepines: POSITIVE — AB
Cocaine: NOT DETECTED
Opiates: NOT DETECTED
Tetrahydrocannabinol: NOT DETECTED

## 2023-06-30 LAB — TROPONIN I (HIGH SENSITIVITY)
Troponin I (High Sensitivity): 11 ng/L (ref <18)
Troponin I (High Sensitivity): 12 ng/L (ref <18)

## 2023-06-30 LAB — LIPASE, BLOOD: Lipase: 27 U/L (ref 11–51)

## 2023-06-30 LAB — PHOSPHORUS: Phosphorus: 3.8 mg/dL (ref 2.5–4.6)

## 2023-06-30 LAB — HEMOGLOBIN A1C
Hgb A1c MFr Bld: 6.3 % — ABNORMAL HIGH (ref 4.8–5.6)
Mean Plasma Glucose: 134.11 mg/dL

## 2023-06-30 LAB — BRAIN NATRIURETIC PEPTIDE: B Natriuretic Peptide: 292.6 pg/mL — ABNORMAL HIGH (ref 0.0–100.0)

## 2023-06-30 LAB — CK: Total CK: 37 U/L — ABNORMAL LOW (ref 38–234)

## 2023-06-30 LAB — D-DIMER, QUANTITATIVE: D-Dimer, Quant: 1.8 ug{FEU}/mL — ABNORMAL HIGH (ref 0.00–0.50)

## 2023-06-30 MED ORDER — PREDNISONE 20 MG PO TABS: 40.0000 mg | ORAL_TABLET | Freq: Every day | ORAL | Status: DC

## 2023-06-30 MED ORDER — IPRATROPIUM-ALBUTEROL 0.5-2.5 (3) MG/3ML IN SOLN
3.0000 mL | Freq: Four times a day (QID) | RESPIRATORY_TRACT | Status: DC
  Administered 2023-07-01 – 2023-07-03 (×5): 3 mL via RESPIRATORY_TRACT
  Filled 2023-06-30 (×6): qty 3

## 2023-06-30 MED ORDER — MIDODRINE HCL 5 MG PO TABS
10.0000 mg | ORAL_TABLET | Freq: Three times a day (TID) | ORAL | Status: DC
  Administered 2023-07-01 – 2023-07-02 (×4): 10 mg via ORAL
  Filled 2023-06-30 (×4): qty 2

## 2023-06-30 MED ORDER — TRAZODONE HCL 50 MG PO TABS
150.0000 mg | ORAL_TABLET | Freq: Every day | ORAL | Status: DC
Start: 2023-06-30 — End: 2023-07-03
  Administered 2023-06-30 – 2023-07-02 (×3): 150 mg via ORAL
  Filled 2023-06-30: qty 3
  Filled 2023-06-30: qty 1
  Filled 2023-06-30: qty 3

## 2023-06-30 MED ORDER — ASPIRIN 81 MG PO CHEW
324.0000 mg | CHEWABLE_TABLET | Freq: Once | ORAL | Status: AC
  Administered 2023-06-30: 324 mg via ORAL
  Filled 2023-06-30: qty 4

## 2023-06-30 MED ORDER — ALBUTEROL SULFATE (2.5 MG/3ML) 0.083% IN NEBU
2.5000 mg | INHALATION_SOLUTION | Freq: Once | RESPIRATORY_TRACT | Status: AC
  Administered 2023-06-30: 2.5 mg via RESPIRATORY_TRACT
  Filled 2023-06-30: qty 3

## 2023-06-30 MED ORDER — ONDANSETRON HCL 4 MG PO TABS: 4.0000 mg | ORAL_TABLET | Freq: Four times a day (QID) | ORAL | Status: DC | PRN

## 2023-06-30 MED ORDER — ROSUVASTATIN CALCIUM 20 MG PO TABS
40.0000 mg | ORAL_TABLET | Freq: Every day | ORAL | Status: DC
  Administered 2023-06-30 – 2023-07-02 (×3): 40 mg via ORAL
  Filled 2023-06-30 (×3): qty 2

## 2023-06-30 MED ORDER — TRAMADOL HCL 50 MG PO TABS: 50.0000 mg | ORAL_TABLET | Freq: Four times a day (QID) | ORAL | Status: DC | PRN

## 2023-06-30 MED ORDER — HEPARIN (PORCINE) 25000 UT/250ML-% IV SOLN
1350.0000 [IU]/h | INTRAVENOUS | Status: DC
  Administered 2023-06-30: 1100 [IU]/h via INTRAVENOUS
  Administered 2023-07-01 – 2023-07-02 (×2): 1350 [IU]/h via INTRAVENOUS
  Filled 2023-06-30 (×3): qty 250

## 2023-06-30 MED ORDER — SODIUM CHLORIDE 0.9 % IV SOLN
1.0000 g | INTRAVENOUS | Status: DC
  Administered 2023-06-30: 1 g via INTRAVENOUS
  Filled 2023-06-30: qty 10

## 2023-06-30 MED ORDER — FENTANYL CITRATE PF 50 MCG/ML IJ SOSY: 12.5000 ug | PREFILLED_SYRINGE | INTRAMUSCULAR | Status: DC | PRN

## 2023-06-30 MED ORDER — DILTIAZEM HCL-DEXTROSE 125-5 MG/125ML-% IV SOLN (PREMIX)
5.0000 mg/h | INTRAVENOUS | Status: DC
  Administered 2023-06-30: 5 mg/h via INTRAVENOUS
  Administered 2023-07-01 – 2023-07-02 (×2): 7.5 mg/h via INTRAVENOUS
  Filled 2023-06-30 (×3): qty 125

## 2023-06-30 MED ORDER — ACETAMINOPHEN 325 MG PO TABS: 650.0000 mg | ORAL_TABLET | Freq: Four times a day (QID) | ORAL | Status: DC | PRN

## 2023-06-30 MED ORDER — METHYLPREDNISOLONE SODIUM SUCC 125 MG IJ SOLR
125.0000 mg | Freq: Once | INTRAMUSCULAR | Status: AC
  Administered 2023-06-30: 125 mg via INTRAVENOUS
  Filled 2023-06-30: qty 2

## 2023-06-30 MED ORDER — METHYLPREDNISOLONE SODIUM SUCC 125 MG IJ SOLR
80.0000 mg | INTRAMUSCULAR | Status: AC
  Administered 2023-07-01: 80 mg via INTRAVENOUS
  Filled 2023-06-30: qty 2

## 2023-06-30 MED ORDER — INSULIN ASPART 100 UNIT/ML IJ SOLN
0.0000 [IU] | INTRAMUSCULAR | Status: DC
  Administered 2023-06-30: 3 [IU] via SUBCUTANEOUS
  Administered 2023-07-01: 2 [IU] via SUBCUTANEOUS
  Administered 2023-07-01 (×2): 3 [IU] via SUBCUTANEOUS
  Administered 2023-07-01: 5 [IU] via SUBCUTANEOUS
  Administered 2023-07-01 (×2): 3 [IU] via SUBCUTANEOUS
  Administered 2023-07-02 (×2): 2 [IU] via SUBCUTANEOUS
  Administered 2023-07-02: 5 [IU] via SUBCUTANEOUS
  Administered 2023-07-02: 2 [IU] via SUBCUTANEOUS

## 2023-06-30 MED ORDER — CLONAZEPAM 0.5 MG PO TABS
1.0000 mg | ORAL_TABLET | Freq: Three times a day (TID) | ORAL | Status: DC
  Administered 2023-06-30 – 2023-07-03 (×7): 1 mg via ORAL
  Filled 2023-06-30 (×7): qty 2

## 2023-06-30 MED ORDER — FUROSEMIDE 10 MG/ML IJ SOLN
20.0000 mg | Freq: Once | INTRAMUSCULAR | Status: AC
  Administered 2023-06-30: 20 mg via INTRAVENOUS
  Filled 2023-06-30: qty 2

## 2023-06-30 MED ORDER — ACETAMINOPHEN 650 MG RE SUPP: 650.0000 mg | Freq: Four times a day (QID) | RECTAL | Status: DC | PRN

## 2023-06-30 MED ORDER — ONDANSETRON HCL 4 MG/2ML IJ SOLN: 4.0000 mg | Freq: Four times a day (QID) | INTRAMUSCULAR | Status: DC | PRN

## 2023-06-30 MED ORDER — SERTRALINE HCL 50 MG PO TABS
50.0000 mg | ORAL_TABLET | Freq: Every morning | ORAL | Status: DC
  Administered 2023-07-01 – 2023-07-03 (×3): 50 mg via ORAL
  Filled 2023-06-30 (×3): qty 1

## 2023-06-30 MED ORDER — METHYLPREDNISOLONE SODIUM SUCC 40 MG IJ SOLR: 40.0000 mg | Freq: Two times a day (BID) | INTRAMUSCULAR | Status: DC

## 2023-06-30 MED ORDER — PANTOPRAZOLE SODIUM 40 MG PO TBEC
40.0000 mg | DELAYED_RELEASE_TABLET | Freq: Every day | ORAL | Status: DC
Start: 2023-07-01 — End: 2023-07-03
  Administered 2023-07-01 – 2023-07-03 (×3): 40 mg via ORAL
  Filled 2023-06-30 (×3): qty 1

## 2023-06-30 MED ORDER — IOHEXOL 350 MG/ML SOLN
75.0000 mL | Freq: Once | INTRAVENOUS | Status: AC | PRN
  Administered 2023-06-30: 75 mL via INTRAVENOUS

## 2023-06-30 MED ORDER — ROPINIROLE HCL 1 MG PO TABS
1.0000 mg | ORAL_TABLET | Freq: Every day | ORAL | Status: DC
Start: 2023-06-30 — End: 2023-07-03
  Administered 2023-06-30 – 2023-07-02 (×3): 1 mg via ORAL
  Filled 2023-06-30: qty 1
  Filled 2023-06-30: qty 2
  Filled 2023-06-30: qty 1

## 2023-06-30 MED ORDER — GABAPENTIN 400 MG PO CAPS
800.0000 mg | ORAL_CAPSULE | Freq: Three times a day (TID) | ORAL | Status: DC
  Administered 2023-06-30: 800 mg via ORAL
  Filled 2023-06-30: qty 2

## 2023-06-30 NOTE — Assessment & Plan Note (Signed)
-   Pt diagnosed with CHF based on presence of the following: , OA, rales on exam,  Pulmonary edema on CXR,   DOE, tachycardia   With noted response to IV diuretic in ER  admit on telemetry,  cycle cardiac enzymes, Cardiac Panel (last 3 results) Recent Labs    06/30/23 1725 06/30/23 1833  TROPONINIHS 11 12     obtain serial ECG  to evaluate for ischemia as a cause of heart failure  monitor daily weight:  Filed Weights   06/30/23 1621  Weight: 89.4 kg   Last BNP BNP (last 3 results) Recent Labs    06/21/23 2121 06/30/23 1725  BNP 208.4* 292.6*    Defer to cards regarding additional lasix doses and monitor orthostatics and creatinine to avoid over diuresis.  Order echogram to evaluate EF and valves     cardiology consulted

## 2023-06-30 NOTE — Assessment & Plan Note (Addendum)
-   Spoke about importance of quitting spent 5 minutes discussing options for treatment, prior attempts at quitting, and dangers of smoking  -At this point patient is     interested in quitting  - Declined nicotine patch   - nursing tobacco cessation protocol

## 2023-06-30 NOTE — Subjective & Objective (Signed)
Pt wih chest pain ever since she was diagnosed with pneumonia on the 23rd. Finished antibiotic but with increasing CP Has past history of COPD and CAD status post open heart surgery she has been having worsening dyspnea

## 2023-06-30 NOTE — H&P (Signed)
Rose [name] AVW:[id number] DOB: [birth year] DOA: 06/30/2023     PCP: Cristino Martes, NP   Outpatient Specialists:  CARDS:   Dr. Donato Schultz, MD   Patient arrived to ER on 06/30/23 at 1616 Referred by Attending Gerhard Munch, MD   Patient coming from:    home Lives With family    Chief Complaint:   Chief Complaint  Patient presents with   Chest Pain    HPI: Rose [name] is a 68 y.o. female with medical history significant of COPD, CAD, asthma, DM2, HLD, mitral stenosis severe status post mitral valve replacement, tobacco abuse, history of pancreatitis, urogenital implant, sleep apnea, history of stroke, history of tricuspid regurgitation    Presented with  CP Pt wih chest pain ever since she was diagnosed with pneumonia on the 23rd. Finished antibiotic but with increasing CP Has past history of COPD and CAD status post open heart surgery she has been having worsening dyspnea      She underwent surgery on 05/11/2023 utilizing a 29mm Mosaic Mitral valve along with TR repair.    She developed atrial fibrillation but quickly converted to NSR  She was started on Coumadin with home health arranged to check INR also required home O2 with 2 L.  Finished Augmentin yesterday Reports sharp chest pains sometimes worse with deep breaths She felt very hot, sweaty Reports chest pressure with a/fib Patient currently resides at a motel where she shares a room with his sister and her dog  Denies significant ETOH intake   Does  smoke  but interested in quitting   Lab Results  Component Value Date   SARSCOV2NAA NEGATIVE 06/30/2023   SARSCOV2NAA NEGATIVE 05/09/2023   SARSCOV2NAA NEGATIVE 06/23/2021   SARSCOV2NAA NEGATIVE 01/13/2021     Regarding pertinent Chronic problems:   Hyperlipidemia - on statins crestor  Lipid Panel     Component Value Date/Time   CHOL 158 12/13/2018 0647   TRIG 133 12/13/2018 0647   HDL 51 12/13/2018 0647   CHOLHDL 3.1 12/13/2018 0647   VLDL 27  12/13/2018 0647   LDLCALC 80 12/13/2018 0647   Hypotensive - on midodrine   chronic CHF diastolic/systolic/ combined - last echo  Recent Results (from the past 82956 hours)  ECHOCARDIOGRAM COMPLETE   Collection Time: 05/09/23 11:38 AM  Result Value   S' Lateral 3.80   Area-P 1/2 2.47   MV M vel 5.19   MV Peak grad 107.7   MV VTI 0.66   Est EF 60 - 65%   Narrative      ECHOCARDIOGRAM REPORT     IMPRESSIONS    1. Left ventricular ejection fraction, by estimation, is 60 to 65%. The left ventricle has normal function. The left ventricle has no regional wall motion abnormalities. Left ventricular diastolic function could not be evaluated. Elevated left atrial  pressure. The E/e' is 33.  2. Right ventricular systolic function is low normal. The right ventricular size is normal. There is normal pulmonary artery systolic pressure.  3. Left atrial size was mildly dilated.  4. Trivial pericardial effusion is present. There is no evidence of cardiac tamponade.  5. The mitral valve is rheumatic. Moderate to severe mitral valve regurgitation. Severe mitral stenosis. The mean mitral valve gradient is 15.0 mmHg with average heart rate of 69 bpm.  6. The aortic valve is normal in structure. Aortic valve regurgitation is not visualized. No aortic stenosis is present.  7. The inferior vena cava is normal in size with greater than  50% respiratory variability, suggesting right atrial pressure of 3 mmHg.             CAD  - On Aspirin, statin,                  -  followed by cardiology              DM 2 -  Lab Results  Component Value Date   HGBA1C 5.8 (H) 05/09/2023       COPD -   on baseline oxygen  2 L,      OSA - noncompliant with CPAP uses oxygen at home    Hx of CVA -  with/out residual deficits on Aspirin 81 mg,     A. Fib -   atrial fibrillation CHA2DS2 vas score  6     current  on anticoagulation with Coumadin           -   Hx of DVT/PE on  anticoagulation with  Coumadin     Chronic anemia - baseline hg Hemoglobin & Hematocrit  Recent Labs    05/17/23 0317 06/21/23 1834 06/30/23 1725  HGB 8.3* 9.9* 9.3*    While in ER:         Lab Orders         Resp panel by RT-PCR (RSV, Flu A&B, Covid) Anterior Nasal Swab         CBC         Comprehensive metabolic panel         Brain natriuretic peptide (order if patient c/o SOB ONLY)         Protime-INR         CBG monitoring, ED       CXR - Cardiac enlargement with mild vascular congestion.     CTA chest - ordered  Following Medications were ordered in ER: Medications  diltiazem (CARDIZEM) 125 mg in dextrose 5% 125 mL (1 mg/mL) infusion (5 mg/hr Intravenous New Bag/Given 06/30/23 1948)  aspirin chewable tablet 324 mg (324 mg Oral Given 06/30/23 1721)  albuterol (PROVENTIL) (2.5 MG/3ML) 0.083% nebulizer solution 2.5 mg (2.5 mg Nebulization Given 06/30/23 1721)  furosemide (LASIX) injection 20 mg (20 mg Intravenous Given 06/30/23 1945)  methylPREDNISolone sodium succinate (SOLU-MEDROL) 125 mg/2 mL injection 125 mg (125 mg Intravenous Given 06/30/23 1944)    _______________________________________________________ ER Provider Called:     Cardiologist  Dr. Derrell Lolling They Recommend admit to medicine    SEEN in ER    ED Triage Vitals  Encounter Vitals Group     BP 06/30/23 1622 (!) 155/69     Systolic BP Percentile --      Diastolic BP Percentile --      Pulse Rate 06/30/23 1622 (!) 42     Resp 06/30/23 1622 20     Temp 06/30/23 1622 98.5 F (36.9 C)     Temp src --      SpO2 06/30/23 1622 100 %     Weight 06/30/23 1621 196 lb 15.7 oz (89.4 kg)     Height 06/30/23 1621 5\' 3"  (1.6 m)     Head Circumference --      Peak Flow --      Pain Score 06/30/23 1621 7     Pain Loc --      Pain Education --      Exclude from Growth Chart --   VQQV(95)@     _________________________________________ Significant initial  Findings: Abnormal Labs Reviewed  CBC -  Abnormal; Notable for the following components:       Result Value   RBC 3.68 (*)    Hemoglobin 9.3 (*)    HCT 29.7 (*)    MCH 25.3 (*)    All other components within normal limits  COMPREHENSIVE METABOLIC PANEL - Abnormal; Notable for the following components:   Glucose, Bld 125 (*)    Creatinine, Ser 1.01 (*)    Albumin 3.0 (*)    Alkaline Phosphatase 164 (*)    All other components within normal limits  BRAIN NATRIURETIC PEPTIDE - Abnormal; Notable for the following components:   B Natriuretic Peptide 292.6 (*)    All other components within normal limits  PROTIME-INR - Abnormal; Notable for the following components:   Prothrombin Time 15.9 (*)    INR 1.3 (*)    All other components within normal limits  CBG MONITORING, ED - Abnormal; Notable for the following components:   Glucose-Capillary 119 (*)    All other components within normal limits   _________________ Troponin  ordered Cardiac Panel (last 3 results) Recent Labs    06/30/23 1725 06/30/23 1833  TROPONINIHS 11 12    ECG: Ordered Personally reviewed and interpreted by me showing: HR : 109 Rhythm:Atrial flutter with variable A-V block with premature ventricular or aberrantly conducted complexes Septal infarct , age undetermined Abnormal ECG QTC 468  BNP (last 3 results) Recent Labs    06/21/23 2121 06/30/23 1725  BNP 208.4* 292.6*     COVID-19 Labs  No results for input(s): "DDIMER", "FERRITIN", "LDH", "CRP" in the last 72 hours.  Lab Results  Component Value Date   SARSCOV2NAA NEGATIVE 06/30/2023   SARSCOV2NAA NEGATIVE 05/09/2023   SARSCOV2NAA NEGATIVE 06/23/2021   SARSCOV2NAA NEGATIVE 01/13/2021     The recent clinical data is shown below. Vitals:   06/30/23 1715 06/30/23 1935 06/30/23 1940 06/30/23 2000  BP: 133/70 125/74  117/75  Pulse: (!) 51  (!) 103 (!) 116  Resp: 17  20 (!) 27  Temp:      SpO2: 99%  97% 97%  Weight:      Height:        WBC     Component Value Date/Time   WBC 6.9 06/30/2023 1725   LYMPHSABS 0.3 (L) 05/14/2023  0951   MONOABS 0.4 05/14/2023 0951   EOSABS 0.1 05/14/2023 0951   BASOSABS 0.0 05/14/2023 0951     Lactic Acid, Venous No results found for: "LATICACIDVEN"   Procalcitonin   Ordered      UA   ordered     Results for orders placed or performed during the hospital encounter of 06/30/23  Resp panel by RT-PCR (RSV, Flu A&B, Covid) Anterior Nasal Swab     Status: None   Collection Time: 06/30/23  4:33 PM   Specimen: Anterior Nasal Swab  Result Value Ref Range Status   SARS Coronavirus 2 by RT PCR NEGATIVE NEGATIVE Final   Influenza A by PCR NEGATIVE NEGATIVE Final   Influenza B by PCR NEGATIVE NEGATIVE Final         Resp Syncytial Virus by PCR NEGATIVE NEGATIVE Final         ____________________________________________________________  Venous  Blood Gas result:  pH   7.37 O2 Saturation 66.9 %   pCO2, Ven 49 mmHg Patient temperature 36.8  pO2, Ven 38 mmHg     ABG    Component Value Date/Time   PHART 7.321 (L) 05/11/2023 1907   PCO2ART 48.2 (H) 05/11/2023 1907  PO2ART 73 (L) 05/11/2023 1907   HCO3 24.8 05/11/2023 1907   TCO2 26 05/11/2023 1907   ACIDBASEDEF 1.0 05/11/2023 1907   O2SAT 93 05/11/2023 1907     __________________________________________________________ Recent Labs  Lab 06/30/23 1725  NA 139  K 3.9  CO2 24  GLUCOSE 125*  BUN 8  CREATININE 1.01*  CALCIUM 8.9    Cr   stable,  Lab Results  Component Value Date   CREATININE 1.01 (H) 06/30/2023   CREATININE 1.09 (H) 06/21/2023   CREATININE 1.28 (H) 05/22/2023    Recent Labs  Lab 06/30/23 1725  AST 16  ALT 13  ALKPHOS 164*  BILITOT 0.4  PROT 6.5  ALBUMIN 3.0*   Lab Results  Component Value Date   CALCIUM 8.9 06/30/2023    Plt: Lab Results  Component Value Date   PLT 224 06/30/2023       Recent Labs  Lab 06/30/23 1725  WBC 6.9  HGB 9.3*  HCT 29.7*  MCV 80.7  PLT 224    HG/HCT  stable     Component Value Date/Time   HGB 9.3 (L) 06/30/2023 1725   HGB 12.1 10/27/2022 1552    HCT 29.7 (L) 06/30/2023 1725   HCT 36.4 10/27/2022 1552   MCV 80.7 06/30/2023 1725   MCV 85 10/27/2022 1552    _______________________________________________ Hospitalist was called for admission for   COPD exacerbation , CHF exacerbation, a.fib w rvr    The following Work up has been ordered so far:  Orders Placed This Encounter  Procedures   Resp panel by RT-PCR (RSV, Flu A&B, Covid) Anterior Nasal Swab   DG Chest Portable 1 View   CBC   Comprehensive metabolic panel   Brain natriuretic peptide (order if patient c/o SOB ONLY)   Protime-INR   ED Cardiac monitoring   HOLD Aspirin if Hold Aspirin if taken within the last 12 hours   Initiate Carrier Fluid Protocol   If O2 sat If O2 sat < 94% administer O2 at 2 liters/minute   ED Cardiac monitoring   Check Peak Flow   Initiate Carrier Fluid Protocol   Consult to hospitalist   CBG monitoring, ED   ED EKG   Insert peripheral IV   Saline lock IV     OTHER Significant initial  Findings:  labs showing:     DM  labs:  HbA1C: Recent Labs    05/09/23 1330  HGBA1C 5.8*       CBG (last 3)  Recent Labs    06/30/23 1641  GLUCAP 119*    Cultures:    Component Value Date/Time   SDES URINE, CLEAN CATCH 02/03/2009 1615   SPECREQUEST NONE 02/03/2009 1615   CULT NO GROWTH 02/03/2009 1615   REPTSTATUS 02/04/2009 FINAL 02/03/2009 1615     Radiological Exams on Admission: DG Chest Portable 1 View Result Date: 06/30/2023 CLINICAL DATA:  Chest pain and cough. Diagnosed with pneumonia in the twenty-third. Finished antibiotics but chest pain increasing. Atrial fibrillation. EXAM: PORTABLE CHEST 1 VIEW COMPARISON:  06/21/2023 FINDINGS: Cardiac enlargement with mild central vascular congestion. No focal consolidation or airspace disease. Left lung infiltrates seen previously are resolved. No pleural effusion. No pneumothorax. Mediastinal contours appear intact. IMPRESSION: Cardiac enlargement with mild vascular congestion.  Electronically Signed   By: Burman Nieves M.D.   On: 06/30/2023 17:07   _______________________________________________________________________________________________________ Latest  Blood pressure 117/75, pulse (!) 116, temperature 99.2 F (37.3 C), resp. rate (!) 27, height 5\' 3"  (1.6 m), weight  89.4 kg, SpO2 97%.   Vitals  labs and radiology finding personally reviewed  Review of Systems:    Pertinent positives include:  , fatigue,chest pain shortness of breath at rest  dyspnea on exertion wheezing. Constitutional:  No weight loss, night sweats, Fevers, chills weight loss  HEENT:  No headaches, Difficulty swallowing,Tooth/dental problems,Sore throat,  No sneezing, itching, ear ache, nasal congestion, post nasal drip,  Cardio-vascular:    Orthopnea, PND, anasarca, dizziness, palpitations.no Bilateral lower extremity swelling  GI:  No heartburn, indigestion, abdominal pain, nausea, vomiting, diarrhea, change in bowel habits, loss of appetite, melena, blood in stool, hematemesis Resp:   No excess mucus, no productive cough, No non-productive cough, No coughing up of blood.No change in color of mucus.No  Skin:  no rash or lesions. No jaundice GU:  no dysuria, change in color of urine, no urgency or frequency. No straining to urinate.  No flank pain.  Musculoskeletal:  No joint pain or no joint swelling. No decreased range of motion. No back pain.  Psych:  No change in mood or affect. No depression or anxiety. No memory loss.  Neuro: no localizing neurological complaints, no tingling, no weakness, no double vision, no gait abnormality, no slurred speech, no confusion  All systems reviewed and apart from HOPI all are negative _______________________________________________________________________________________________ Past Medical History:   Past Medical History:  Diagnosis Date   Allergy    Anemia    Anxiety    Aortic atherosclerosis (HCC)    Arthritis    Asthma     Back pain    CAD (coronary artery disease)    Chronic headaches    Class 1 obesity due to excess calories with serious comorbidity in adult    Complication of anesthesia    slow to wake up   COPD (chronic obstructive pulmonary disease) (HCC)    Diabetes (HCC)    Fatty liver    GERD (gastroesophageal reflux disease)    Headache    Herniated lumbar intervertebral disc    High cholesterol    History of blood clots    Lumbago with sciatica, unspecified side    Mitral stenosis    severe by 05/18/21 echo   Moderate episode of recurrent major depressive disorder (HCC)    Nicotine dependence, cigarettes, with other nicotine-induced disorders    Nonrheumatic aortic (valve) insufficiency    Other chronic pain    Pain in joint involving ankle and foot    Pancreatitis    Pneumonia    Presence urogenital implant    Severe mitral regurgitation    Sleep apnea    no cpap   Stroke (HCC)    mild   SUI (stress urinary incontinence, female)    Tricuspid regurgitation    Type 2 diabetes mellitus with hyperglycemia, with long-term current use of insulin (HCC)      Past Surgical History:  Procedure Laterality Date   ANKLE SURGERY     x 3   Bladder tack     BREAST REDUCTION SURGERY     CHOLECYSTECTOMY  1998   HERNIA REPAIR     I & D EXTREMITY Right 10/28/2021   Procedure: IRRIGATION AND DEBRIDEMENT EXTREMITY, GREAT TOE;  Surgeon: Netta Cedars, MD;  Location: [facility] OR;  Service: Orthopedics;  Laterality: Right;   MITRAL VALVE REPLACEMENT N/A 05/11/2023   Procedure: MITRAL VALVE (MV) REPLACEMENT USING MOSAIC VALVE SIZE ;  Surgeon: Eugenio Hoes, MD;  Location: [facility] OR;  Service: Open Heart Surgery;  Laterality: N/A;  RIGHT/LEFT HEART CATH AND CORONARY ANGIOGRAPHY N/A 11/02/2022   Procedure: RIGHT/LEFT HEART CATH AND CORONARY ANGIOGRAPHY;  Surgeon: Tonny Bollman, MD;  Location: [facility] INVASIVE CV LAB;  Service: Cardiovascular;  Laterality: N/A;   TEE WITHOUT CARDIOVERSION N/A 11/02/2022    Procedure: TRANSESOPHAGEAL ECHOCARDIOGRAM;  Surgeon: Jake Bathe, MD;  Location: [facility] INVASIVE CV LAB;  Service: Cardiovascular;  Laterality: N/A;   TEE WITHOUT CARDIOVERSION N/A 05/11/2023   Procedure: TRANSESOPHAGEAL ECHOCARDIOGRAM;  Surgeon: Eugenio Hoes, MD;  Location: [facility] OR;  Service: Open Heart Surgery;  Laterality: N/A;   VAGINAL HYSTERECTOMY  1988    Social History:  Ambulatory   independently or  walker       reports that she has been smoking cigarettes. She has a 23.5 pack-year smoking history. She has never used smokeless tobacco. She reports that she does not currently use drugs after having used the following drugs: Marijuana. She reports that she does not drink alcohol.    Family History:   Family History  Problem Relation Age of Onset   Emphysema Mother    Heart disease Mother    Cancer Mother    Heart disease Father    Cancer Sister    ______________________________________________________________________________________________ Allergies: Allergies  Allergen Reactions   Erythromycin Shortness Of Breath   Nickel Rash and Shortness Of Breath   Oxycodone Shortness Of Breath   Meperidine Other (See Comments)    Confusion and tingling  (noted in H&P)    Nicotine Hives and Itching   Codeine Swelling   Hydrocodone-Acetaminophen Itching    Tolerated Norco 11/2018   Morphine Itching   Propoxyphene Other (See Comments)    Ineffective    Quetiapine     Restless leg syndrome     Prior to Admission medications   Medication Sig Start Date End Date Taking? Authorizing Provider  acetaminophen (TYLENOL) 500 MG tablet Take 500 mg by mouth every 6 (six) hours as needed for mild pain (pain score 1-3) or headache.    [provider]  albuterol (VENTOLIN HFA) 108 (90 Base) MCG/ACT inhaler SMARTSIG:1 Puff(s) Via Inhaler Every 6 Hours PRN 06/13/23   [provider]  amoxicillin-clavulanate (AUGMENTIN) 875-125 MG tablet Take 1 tablet by mouth every 12  (twelve) hours. 06/22/23   Gwyneth Sprout, MD  clonazePAM (KLONOPIN) 1 MG tablet Take 1 mg by mouth 3 (three) times daily.    [provider]  clotrimazole (MYCELEX) 10 MG troche SMARTSIG:1 Lozenge(s) By Mouth 5 Times Daily 05/25/23   [provider]  Fluticasone-Umeclidin-Vilant (TRELEGY ELLIPTA) 200-62.5-25 MCG/ACT AEPB Inhale 200 mcg into the lungs daily as needed.    [provider]  gabapentin (NEURONTIN) 800 MG tablet Take 800 mg by mouth 3 (three) times daily.    [provider]  metFORMIN (GLUCOPHAGE) 500 MG tablet Take 500 mg by mouth 2 (two) times daily. 03/05/19   [provider]  midodrine (PROAMATINE) 10 MG tablet Take 1 tablet (10 mg total) by mouth 3 (three) times daily with meals. 05/22/23   Stehler, Oren Bracket, PA-C  omeprazole (PRILOSEC) 40 MG capsule Take 40 mg by mouth daily. 01/23/22   [provider]  ondansetron (ZOFRAN) 4 MG tablet Take 4 mg by mouth every 8 (eight) hours as needed for nausea or vomiting.    [provider]  OXYGEN Inhale 2 L into the lungs continuous.    [provider]  Pancrelipase, Lip-Prot-Amyl, (CREON) 24000-76000 units CPEP Take 2 capsules by mouth 3 (three) times daily after meals.  [provider]  rOPINIRole (REQUIP) 1 MG tablet Take 1 mg by mouth at bedtime.    [provider]  rosuvastatin (CRESTOR) 40 MG tablet Take 1 tablet (40 mg total) by mouth at bedtime. 05/22/23 08/20/23  Stehler, Oren Bracket, PA-C  sertraline (ZOLOFT) 50 MG tablet Take 50 mg by mouth every morning. 01/11/23   [provider]  traMADol (ULTRAM) 50 MG tablet Take 50-100 mg by mouth every 6 (six) hours as needed for moderate pain (pain score 4-6).    [provider]  traZODone (DESYREL) 50 MG tablet Take 150 mg by mouth at bedtime.    [provider]  warfarin (COUMADIN) 2.5 MG tablet Take 1-2 tablets Daily or as prescribed by [facility] Patient taking  differently: Take 2.5 mg by mouth daily. 5mg  (2.5 mg x 2) every Mon, Wed, Fri; 2.5 mg (2.5 mg x 1) all other days 06/15/23   Jake Bathe, MD    ___________________________________________________________________________________________________ Physical Exam:    06/30/2023    8:00 PM 06/30/2023    7:40 PM 06/30/2023    7:35 PM  Vitals with BMI  Systolic 117  125  Diastolic 75  74  Pulse 116 103     1. General:  in No  Acute distress   Chronically ill  -appearing 2. Psychological: Alert and   Oriented 3. Head/ENT:    Dry Mucous Membranes                          Head Non traumatic, neck supple                       Poor Dentition 4. SKIN: normal   Skin turgor,  Skin clean Dry and intact no rash    5. Heart: Regular rate and rhythm   Murmur, no Rub or gallop 6. Lungs: , no wheezes or crackles   7. Abdomen: Soft,  non-tender, Non distended   obese  bowel sounds present 8. Lower extremities: no clubbing, cyanosis, no  edema 9. Neurologically Grossly intact, moving all 4 extremities equally  10. MSK: Normal range of motion    Chart has been reviewed  ______________________________________________________________________________________________  Assessment/Plan  68 y.o. female with medical history significant of COPD, CAD, asthma, DM2, HLD, mitral stenosis severe status post mitral valve replacement, tobacco abuse, history of pancreatitis, urogenital implant, sleep apnea, history of stroke, history of tricuspid regurgitation  Admitted for   COPD exacerbation (HCC), diastolic CHF, A.fib w RVR     Present on Admission:  Acute on chronic diastolic CHF (congestive heart failure) (HCC)  Anemia of chronic disease  Tobacco abuse  COPD with acute exacerbation (HCC)  Chronic respiratory failure with hypoxia (HCC)  Chest pain   Acute on chronic diastolic CHF (congestive heart failure) (HCC) - Pt diagnosed with CHF based on presence of the following: , OA, rales on exam,  Pulmonary edema  on CXR,   DOE, tachycardia   With noted response to IV diuretic in ER  admit on telemetry,  cycle cardiac enzymes, Cardiac Panel (last 3 results) Recent Labs    06/30/23 1725 06/30/23 1833  TROPONINIHS 11 12     obtain serial ECG  to evaluate for ischemia as a cause of heart failure  monitor daily weight:  Filed Weights   06/30/23 1621  Weight: 89.4 kg   Last BNP BNP (last 3 results) Recent Labs    06/21/23 2121 06/30/23 1725  BNP 208.4* 292.6*    Defer to cards regarding additional lasix doses and monitor orthostatics and creatinine to avoid over diuresis.  Order echogram to evaluate EF and valves     cardiology consulted    Anemia of chronic disease Obtain anemia panel  Transfuse for Hg <7 , rapidly dropping or  if symptomatic   Long term (current) use of anticoagulants Coumadin subtheraputic Heparin bridge  S/P mitral valve replacement Appreciate cardiology consult would recommend heparin bridging for subtherapeutic Coumadin and repeat echo  Tobacco abuse  - Spoke about importance of quitting spent 5 minutes discussing options for treatment, prior attempts at quitting, and dangers of smoking  -At this point patient is     interested in quitting  - Declined nicotine patch   - nursing tobacco cessation protocol   Type 2 diabetes mellitus without complications (HCC)  - Order Sensitive  SSI    -  check TSH and HgA1C  - Hold by mouth medications    COPD with acute exacerbation (HCC)  -  - Will initiate: Steroid taper  -  Antibiotics rocephin - Albuterol  PRN, - scheduled duoneb,     -  Mucinex.  Titrate O2 to saturation >90%. Follow patients respiratory status. influenza PCR negative   VBG pending    Currently mentating well no evidence of symptomatic hypercarbia   Chronic respiratory failure with hypoxia (HCC) Chronic respiratory failure on 2 L secondary to COPD stable  Chest pain Troponin unremarkable EKG nonischemic.  Given history of CAD  and mitral valve replacement subtherapeutic on Coumadin.  Patient reports some of her component of chest pain could be somewhat pleuritic D-dimer elevated order CTA.  appreciate cardiology involvement    Other plan as per orders.  DVT prophylaxis:  Heparin drip   Code Status:    Code Status: Prior FULL CODE  as per patient   I had personally discussed CODE STATUS with patient   ACP   none   Family Communication:   Family not at  Bedside    Diet diabetic   Disposition Plan:      To home once workup is complete and patient is stable   Following barriers for discharge:                             Chest pain  work up is complete                                                       Anemia  stable                             Pain controlled with PO medications                                                           Will need consultants to evaluate patient prior to discharge       Consult Orders  (From admission, onward)           Start     Ordered  06/30/23 1930  Consult to hospitalist  Paged By Estrella Myrtle #[id number]  [facility] [address] [place]  [phone]  Once       Provider:  (Not yet assigned)  Question Answer Comment  Place call to: [facility]   Reason for Consult Admit      06/30/23 1930                                Consults called: cardiology is aware   Admission status:  ED Disposition     ED Disposition  Admit   Condition  --   Comment  Hospital [facility]: [facility] [facility] [100100]  Level of Care: Progressive [102]  Admit to Progressive based on following criteria: CARDIOVASCULAR & THORACIC of moderate stability with acute coronary syndrome symptoms/low risk myocardial infarction/hypertensive urgency/arrhythmias/heart failure potentially compromising stability and stable post cardiovascular intervention patients.  May place patient in observation at [facility] if equivalent level of care is  available:: No  Covid Evaluation: Asymptomatic - no recent exposure (last 10 days) testing not required  Diagnosis: Acute on chronic diastolic CHF (congestive heart failure) [facility]) [725366]  Admitting Physician: Therisa Doyne [[id number]]  Attending Physician: Therisa Doyne [[id number]]           inpatient     I Expect 2 midnight stay secondary to severity of patient's current illness need for inpatient interventions justified by the following:  hemodynamic instability despite optimal treatment (tachycardia  )   Severe lab/radiological/exam abnormalities including:    A.fib and extensive comorbidities including:  DM2   CHF   CAD  COPD/asthma   That are currently affecting medical management.   I expect  patient to be hospitalized for 2 midnights requiring inpatient medical care.  Patient is at high risk for adverse outcome (such as loss of life or disability) if not treated.  Indication for inpatient stay as follows:     persistent chest pain despite medical management     Need for IV antibiotics,  IV anticoagulation,     Level of care      progressive       Lab Results  Component Value Date   SARSCOV2NAA NEGATIVE 06/30/2023     Precautions: admitted as   Covid Negative     Rhemi Balbach 06/30/2023, 10:12 PM    [facility]     after 2 AM please page floor coverage PA If 7AM-7PM, please contact the day team taking care of the patient using [email or web address]

## 2023-06-30 NOTE — Assessment & Plan Note (Signed)
Coumadin subtheraputic Heparin bridge

## 2023-06-30 NOTE — Assessment & Plan Note (Signed)
 Obtain anemia panel  Transfuse for Hg <7 , rapidly dropping or  if symptomatic

## 2023-06-30 NOTE — Assessment & Plan Note (Signed)
-  -   Will initiate: Steroid taper  -  Antibiotics rocephin - Albuterol  PRN, - scheduled duoneb,    -  Mucinex.  Titrate O2 to saturation >90%. Follow patients respiratory status.  influenza PCR negative   VBG pending   Currently mentating well no evidence of symptomatic hypercarbia

## 2023-06-30 NOTE — ED Notes (Signed)
Pt provided with [place] sandwich, and pb crackers

## 2023-06-30 NOTE — Assessment & Plan Note (Signed)
-   Order Sensitive  SSI     -  check TSH and HgA1C  - Hold by mouth medications*  

## 2023-06-30 NOTE — Progress Notes (Addendum)
PHARMACY - ANTICOAGULATION CONSULT NOTE  Pharmacy Consult for heparin Indication: atrial fibrillation/mitral valve  Patient Measurements: Height: 5\' 3"  (160 cm) Weight: 89.4 kg (196 lb 15.7 oz) IBW/kg (Calculated) : 52.4 Heparin Dosing Weight: 73 kg  Vital Signs: Temp: 98.2 F (36.8 C) (02/01 2125) Temp Source: Oral (02/01 2125) BP: 121/63 (02/01 2015) Pulse Rate: 102 (02/01 2015)  Labs: Recent Labs    06/30/23 1725 06/30/23 1833 06/30/23 2040  HGB 9.3*  --   --   HCT 29.7*  --   --   PLT 224  --   --   LABPROT 15.9*  --   --   INR 1.3*  --   --   CREATININE 1.01*  --   --   CKTOTAL  --   --  37*  TROPONINIHS 11 12  --     Estimated Creatinine Clearance: 58.1 mL/min (A) (by C-G formula based on SCr of 1.01 mg/dL (H)).  Medications:  PTA warfarin: 3.75mg  PO every MWF and 2.5 mg PO all other days  Assessment: 37 yoF presented to the ED with chest pain. Pharmacy consulted to dose heparin for atrial fibrillation  Hgb 9.3, plts 224, sCr 1.01, INR 1.3, last dose of warfarin 2/1  Goal of Therapy:  Heparin level 0.3-0.7 units/ml INR 2.5-3.5 Monitor platelets by anticoagulation protocol: Yes   Plan:  -Start heparin gtt at 1100 units/hr -Heparin level in 8h -Follow up warfarin restart -CBC and heparin level daily  Arabella Merles, PharmD. Clinical Pharmacist 06/30/2023 10:36 PM

## 2023-06-30 NOTE — Assessment & Plan Note (Signed)
Chronic respiratory failure on 2 L secondary to COPD stable

## 2023-06-30 NOTE — Assessment & Plan Note (Signed)
Troponin unremarkable EKG nonischemic.  Given history of CAD and mitral valve replacement subtherapeutic on Coumadin.  Patient reports some of her component of chest pain could be somewhat pleuritic D-dimer elevated order CTA.  appreciate cardiology involvement

## 2023-06-30 NOTE — ED Triage Notes (Signed)
Pt c/o continued chest pain since she was dx with pneumonia on the 23rd.  Pt has finished her antibiotic, but the chest pain is increasing.  States she was dx with afib in Dec when she had open heart surgery,  but has not been placed on any medication for that.

## 2023-06-30 NOTE — Assessment & Plan Note (Signed)
Appreciate cardiology consult would recommend heparin bridging for subtherapeutic Coumadin and repeat echo

## 2023-06-30 NOTE — Consult Note (Signed)
Cardiology Consultation   Patient ID: Rose [name] MRN: [id number]; DOB: 07-16-1955  Admit date: 06/30/2023 Date of Consult: 06/30/2023  PCP:  Cristino Martes, NP    [facility] Providers Cardiologist:  Donato Schultz, MD        Patient Profile:   Rose [name] is a 68 y.o. 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  History of Present Illness:   Rose [name] states she has had left-sided chest pain that has occurred for several days.  She states symptoms were sudden in onset, unrelated to exertion.  1 day prior to her ER visit, she endorsed hot flashes, nausea, and shortness of breath.  States she also felt her heart flutter during this period.  She states she can tell when her heart is in an abnormal rhythm.  Rose [name] follows up with Dr.Skains for management of atrial fibrillation and mitral regurgitation and mitral stenosis. She was seen for initial office visit in 10/27/2022 due to mitral stenosis seen on echo in 2022. She had a previous nuclear stress test in 2022 that showed normal RV perfusion with increased RV uptake suggestive of RVH. She underwent a TEE for further workup as well as R/LHC that showed mild nonobstructive disease and severe Rose with EF of 60 to 65%.  She was most recently seen on 05/28/2023.  Her most recent echo on 05/09/2023 demonstrates normal LV function, low normal RV function, mildly dilated left atrium, mitral valve gradient  She underwent surgery on 05/11/2023 utilizing a 29mm Mosaic Mitral valve along with TR repair. She tolerated the procedure well. She developed atrial fibrillation but quickly converted to NSR  with an Amiodarone bolus. She was started on Coumadin with home health arranged to check INR also required home O2 with 2 L. She refused SNF due to having dog at home was discharged home with sister to help with support. She was discharged on 05/22/2023 with follow-up in January with Dr. Leafy Ro.   She later presented to the emergency department on 06/21/2023 with intermittent left-sided chest pain.  She was found to be in rate controlled atrial flutter, hsTnT 12.  She was started on Augmentin, but otherwise no changes were made to her medication regimen.  In the ER, vitals include blood pressure 155/69, heart rate 42-116, SpO2 100% on 3 L oxygen.  Notable labs include K of 3.9, creatinine 1.01, troponin 11->12, hemoglobin 9.3, INR 1.3.  Chest x-ray showed cardiac enlargement with mild vascular congestion.   Past Medical History:  Diagnosis Date   Allergy    Anemia    Anxiety    Aortic atherosclerosis (HCC)    Arthritis    Asthma    Back pain    CAD (coronary artery disease)    Chronic headaches    Class 1 obesity due to excess calories with serious comorbidity in adult    Complication of anesthesia    slow to wake up   COPD (chronic obstructive pulmonary disease) (HCC)    Diabetes (HCC)    Fatty liver    GERD (gastroesophageal reflux disease)    Headache    Herniated lumbar intervertebral disc    High cholesterol    History of blood clots    Lumbago with sciatica,  unspecified side    Mitral stenosis    severe by 05/18/21 echo   Moderate episode of recurrent major depressive disorder (HCC)    Nicotine dependence, cigarettes, with other nicotine-induced disorders    Nonrheumatic aortic (valve) insufficiency    Other chronic pain    Pain in joint involving ankle and foot    Pancreatitis    Pneumonia    Presence urogenital implant    Severe mitral regurgitation    Sleep apnea    no cpap   Stroke (HCC)    mild   SUI (stress urinary incontinence, female)    Tricuspid  regurgitation    Type 2 diabetes mellitus with hyperglycemia, with long-term current use of insulin (HCC)     Past Surgical History:  Procedure Laterality Date   ANKLE SURGERY     x 3   Bladder tack     BREAST REDUCTION SURGERY     CHOLECYSTECTOMY  1998   HERNIA REPAIR     I & D EXTREMITY Right 10/28/2021   Procedure: IRRIGATION AND DEBRIDEMENT EXTREMITY, GREAT TOE;  Surgeon: Netta Cedars, MD;  Location: [facility] OR;  Service: Orthopedics;  Laterality: Right;   MITRAL VALVE REPLACEMENT N/A 05/11/2023   Procedure: MITRAL VALVE (MV) REPLACEMENT USING MOSAIC VALVE SIZE ;  Surgeon: Eugenio Hoes, MD;  Location[facility] OR;  Service: Open Heart Surgery;  Laterality: N/A;   RIGHT/LEFT HEART CATH AND CORONARY ANGIOGRAPHY N/A 11/02/2022   Procedure: RIGHT/LEFT HEART CATH AND CORONARY ANGIOGRAPHY;  Surgeon: Tonny Bollman, MD;  Location: [facility] INVASIVE CV LAB;  Service: Cardiovascular;  Laterality: N/A;   TEE WITHOUT CARDIOVERSION N/A 11/02/2022   Procedure: TRANSESOPHAGEAL ECHOCARDIOGRAM;  Surgeon: Jake Bathe, MD;  Location: [facility] INVASIVE CV LAB;  Service: Cardiovascular;  Laterality: N/A;   TEE WITHOUT CARDIOVERSION N/A 05/11/2023   Procedure: TRANSESOPHAGEAL ECHOCARDIOGRAM;  Surgeon: Eugenio Hoes, MD;  Location: [facility] OR;  Service: Open Heart Surgery;  Laterality: N/A;   VAGINAL HYSTERECTOMY  1988     Home Medications:  Prior to Admission medications   Medication Sig Start Date End Date Taking? Authorizing Provider  acetaminophen (TYLENOL) 500 MG tablet Take 500 mg by mouth every 6 (six) hours as needed for mild pain (pain score 1-3) or headache.   Yes [provider]  albuterol (VENTOLIN HFA) 108 (90 Base) MCG/ACT inhaler SMARTSIG:1 Puff(s) Via Inhaler Every 6 Hours PRN 06/13/23  Yes [provider]  amoxicillin-clavulanate (AUGMENTIN) 875-125 MG tablet Take 1 tablet by mouth every 12 (twelve) hours. 06/22/23  Yes Plunkett, Alphonzo Lemmings, MD  clonazePAM (KLONOPIN) 1 MG tablet Take 1  mg by mouth 3 (three) times daily.   Yes [provider]  clotrimazole (MYCELEX) 10 MG troche SMARTSIG:1 Lozenge(s) By Mouth 5 Times Daily 05/25/23  Yes [provider]  Fluticasone-Umeclidin-Vilant (TRELEGY ELLIPTA) 200-62.5-25 MCG/ACT AEPB Inhale 200 mcg into the lungs daily as needed.   Yes [provider]  gabapentin (NEURONTIN) 800 MG tablet Take 800 mg by mouth 3 (three) times daily.   Yes [provider]  metFORMIN (GLUCOPHAGE) 500 MG tablet Take 500 mg by mouth 2 (two) times daily. 03/05/19  Yes [provider]  midodrine (PROAMATINE) 10 MG tablet Take 1 tablet (10 mg total) by mouth 3 (three) times daily with meals. 05/22/23  Yes Stehler, Oren Bracket, PA-C  omeprazole (PRILOSEC) 40 MG capsule Take 40 mg by mouth daily. 01/23/22  Yes [provider]  ondansetron (ZOFRAN) 4 MG tablet Take 4 mg by mouth every 8 (eight) hours  as needed for nausea or vomiting.   Yes [provider]  OXYGEN Inhale 2 L into the lungs continuous.   Yes [provider]  Pancrelipase, Lip-Prot-Amyl, (CREON) 24000-76000 units CPEP Take 2 capsules by mouth 3 (three) times daily after meals.   Yes [provider]  rOPINIRole (REQUIP) 1 MG tablet Take 1 mg by mouth at bedtime.   Yes [provider]  rosuvastatin (CRESTOR) 40 MG tablet Take 1 tablet (40 mg total) by mouth at bedtime. 05/22/23 08/20/23 Yes Stehler, Oren Bracket, PA-C  sertraline (ZOLOFT) 50 MG tablet Take 50 mg by mouth every morning. 01/11/23  Yes [provider]  traMADol (ULTRAM) 50 MG tablet Take 50-100 mg by mouth every 6 (six) hours as needed for moderate pain (pain score 4-6).   Yes [provider]  traZODone (DESYREL) 50 MG tablet Take 150 mg by mouth at bedtime.   Yes [provider]  warfarin (COUMADIN) 2.5 MG tablet Take 1-2 tablets Daily or as prescribed by [facility] Patient taking differently: Take 2.5 mg by mouth daily. Take 5mg  (two  tablets) once daily every Mon, Wed, Fri. Take 2.5mg  (1 tablet) all other days. 06/15/23  Yes Jake Bathe, MD    Inpatient Medications: Scheduled Meds:  insulin aspart  0-9 Units Subcutaneous Q4H   Continuous Infusions:  diltiazem (CARDIZEM) infusion 7.5 mg/hr (06/30/23 2023)   PRN Meds:   Allergies:    Allergies  Allergen Reactions   Erythromycin Shortness Of Breath   Nickel Rash and Shortness Of Breath   Oxycodone Shortness Of Breath   Meperidine Other (See Comments)    Confusion and tingling  (noted in H&P)    Nicotine Hives and Itching   Codeine Swelling   Hydrocodone-Acetaminophen Itching    Tolerated Norco 11/2018   Morphine Itching   Propoxyphene Other (See Comments)    Ineffective    Quetiapine     Restless leg syndrome    Social History:   Social History   Socioeconomic History   Marital status: Single    Spouse name: Not on file   Number of children: Not on file   Years of education: Not on file   Highest education level: Not on file  Occupational History   Not on file  Tobacco Use   Smoking status: Every Day    Current packs/day: 0.50    Average packs/day: 0.5 packs/day for 47.0 years (23.5 ttl pk-yrs)    Types: Cigarettes   Smokeless tobacco: Never   Tobacco comments:    0.5 packs smoked daily. ARJ 03/29/21    Patient states she is not ready to give up smoking.   Vaping Use   Vaping status: Former   Substances: Nicotine, Flavoring  Substance and Sexual Activity   Alcohol use: No    Alcohol/week: 0.0 standard drinks of alcohol   Drug use: Not Currently    Types: Marijuana   Sexual activity: Not Currently  Other Topics Concern   Not on file  Social History Narrative   Lives in an apartment with her sister [name]. Patient states [name] is selling her Xanax and that she will not leave her medication at the house when she leaves. Patient carries her meds with her at all times.  Rose [name] stated that she is in process of being evicted from her  apartment d/t complaints against her for harassing other tenants. Patient states she is working with [employer] to find a place to live but they "will  not help her". Patient stated she has court on Dec 17th for the eviction but she will not be there as her surgery is dec 13 so she hopes that she has a place to live when she gets discharged.           Patient also states she has to use [employer] to arrange transportation for her to get to all appointments.  Patient states that she uses [facility] but needs refills on her medications.           The patients main concern is her dog who lives with her and that she needs to have someone stay with the dog at all times.     Social Drivers of Health   Financial Resource Strain: Medium Risk (01/25/2023)   Overall Financial Resource Strain (CARDIA)    Difficulty of Paying Living Expenses: Somewhat hard  Food Insecurity: No Food Insecurity (05/13/2023)   Hunger Vital Sign    Worried About Running Out of Food in the Last Year: Never true    Ran Out of Food in the Last Year: Never true  Transportation Needs: No Transportation Needs (05/11/2023)   PRAPARE - Administrator, Civil Service (Medical): No    Lack of Transportation (Non-Medical): No  Physical Activity: Not on file  Stress: Not on file  Social Connections: Unknown (05/17/2022)   Received from [facility]   Social Network    Social Network: Not on file  Intimate Partner Violence: Not At Risk (05/11/2023)   Humiliation, Afraid, Rape, and Kick questionnaire    Fear of Current or Ex-Partner: No    Emotionally Abused: No    Physically Abused: No    Sexually Abused: No    Family History:    Family History  Problem Relation Age of Onset   Emphysema Mother    Heart disease Mother    Cancer Mother    Heart disease Father    Cancer Sister      ROS:  Please see the history of present illness.   All other ROS reviewed and negative.      Physical Exam/Data:   Vitals:   06/30/23 1935 06/30/23 1940 06/30/23 2000 06/30/23 2015  BP: 125/74  117/75 121/63  Pulse:  (!) 103 (!) 116 (!) 102  Resp:  20 (!) 27 (!) 23  Temp:      SpO2:  97% 97% 97%  Weight:      Height:       No intake or output data in the 24 hours ending 06/30/23 2110    06/30/2023    4:21 PM 06/21/2023    6:32 PM 05/31/2023    1:48 PM  Last 3 Weights  Weight (lbs) 196 lb 15.7 oz 196 lb 15.7 oz 197 lb  Weight (kg) 89.35 kg 89.35 kg 89.359 kg     Body mass index is 34.89 kg/m.  General:  Well nourished, well developed, in no acute distress HEENT: normal Neck: no JVD appreciated Vascular: No carotid bruits; Distal pulses 2+ bilaterally Cardiac:  irregular rate Lungs:  clear to auscultation bilaterally, no wheezing, rhonchi or rales  Abd: soft, nontender, no hepatomegaly  Ext: no edema Musculoskeletal:  No deformities, BUE and BLE strength normal and equal Skin: warm and dry  Neuro:  CNs 2-12 intact, no focal abnormalities noted Psych:  Normal affect   EKG:  The EKG was personally reviewed and demonstrates:  atrial fibrillation with rapid ventricular rates Telemetry:  Telemetry  was personally reviewed and demonstrates:  atrial fibrillation  Relevant CV Studies:  04/2023  Sonographer Comments: Technically difficult study due to poor echo  windows.  IMPRESSIONS     1. Left ventricular ejection fraction, by estimation, is 60 to 65%. The  left ventricle has normal function. The left ventricle has no regional  wall motion abnormalities. Left ventricular diastolic function could not  be evaluated. Elevated left atrial  pressure. The E/e' is 33.   2. Right ventricular systolic function is low normal. The right  ventricular size is normal. There is normal pulmonary artery systolic  pressure.   3. Left atrial size was mildly dilated.   4. Trivial pericardial effusion is present. There is no evidence of  cardiac tamponade.   5. The mitral valve  is rheumatic. Moderate to severe mitral valve  regurgitation. Severe mitral stenosis. The mean mitral valve gradient is  15.0 mmHg with average heart rate of 69 bpm.   6. The aortic valve is normal in structure. Aortic valve regurgitation is  not visualized. No aortic stenosis is present.   7. The inferior vena cava is normal in size with greater than 50%  respiratory variability, suggesting right atrial pressure of 3 mmHg.   Comparison(s): Prior TEE 11/02/2022 reported: LVEF 60-65%, RVSP ,  severe LAE, moderate RAE, severe Rose (MG ), severe MR, severe TR.   FINDINGS   Left Ventricle: Left ventricular ejection fraction, by estimation, is 60  to 65%. The left ventricle has normal function. The left ventricle has no  regional wall motion abnormalities. The left ventricular internal cavity  size was normal in size. There is   no left ventricular hypertrophy. Left ventricular diastolic function  could not be evaluated due to mitral stenosis. Left ventricular diastolic  function could not be evaluated. Elevated left atrial pressure. The E/e'  is 52.   Right Ventricle: The right ventricular size is normal. Right vetricular  wall thickness was not well visualized. Right ventricular systolic  function is low normal. There is normal pulmonary artery systolic  pressure. The tricuspid regurgitant velocity is  1.97 m/s, and with an assumed right atrial pressure of 3 mmHg, the  estimated right ventricular systolic pressure is 18.5 mmHg.   Left Atrium: Left atrial size was mildly dilated.   Right Atrium: Right atrial size was normal in size.   Pericardium: Trivial pericardial effusion is present. There is no evidence  of cardiac tamponade.   Mitral Valve: The mitral valve is rheumatic. Moderate to severe mitral  valve regurgitation. Severe mitral valve stenosis. MV peak gradient, 23.9  mmHg. The mean mitral valve gradient is 15.0 mmHg with average heart rate  of 69 bpm.    Tricuspid Valve: The tricuspid valve is grossly normal. Tricuspid valve  regurgitation is mild . No evidence of tricuspid stenosis.   Aortic Valve: The aortic valve is normal in structure. Aortic valve  regurgitation is not visualized. No aortic stenosis is present.   Pulmonic Valve: The pulmonic valve was not well visualized. Pulmonic valve  regurgitation is trivial. No evidence of pulmonic stenosis.   Aorta: The aortic root and ascending aorta are structurally normal, with  no evidence of dilitation.   Venous: The inferior vena cava is normal in size with greater than 50%  respiratory variability, suggesting right atrial pressure of 3 mmHg.   IAS/Shunts: The atrial septum is grossly normal.    Laboratory Data:  High Sensitivity Troponin:   Recent Labs  Lab 06/21/23 1834 06/21/23 2120 06/30/23  1725 06/30/23 1833  TROPONINIHS 12 12 11 12      Chemistry Recent Labs  Lab 06/30/23 1725  NA 139  K 3.9  CL 103  CO2 24  GLUCOSE 125*  BUN 8  CREATININE 1.01*  CALCIUM 8.9  GFRNONAA >60  ANIONGAP 12    Recent Labs  Lab 06/30/23 1725  PROT 6.5  ALBUMIN 3.0*  AST 16  ALT 13  ALKPHOS 164*  BILITOT 0.4   Lipids No results for input(s): "CHOL", "TRIG", "HDL", "LABVLDL", "LDLCALC", "CHOLHDL" in the last 168 hours.  Hematology Recent Labs  Lab 06/30/23 1725  WBC 6.9  RBC 3.68*  HGB 9.3*  HCT 29.7*  MCV 80.7  MCH 25.3*  MCHC 31.3  RDW 15.5  PLT 224   Thyroid No results for input(s): "TSH", "FREET4" in the last 168 hours.  BNP Recent Labs  Lab 06/30/23 1725  BNP 292.6*    DDimer No results for input(s): "DDIMER" in the last 168 hours.   Radiology/Studies:  DG Chest Portable 1 View Result Date: 06/30/2023 CLINICAL DATA:  Chest pain and cough. Diagnosed with pneumonia in the twenty-third. Finished antibiotics but chest pain increasing. Atrial fibrillation. EXAM: PORTABLE CHEST 1 VIEW COMPARISON:  06/21/2023 FINDINGS: Cardiac enlargement with mild central  vascular congestion. No focal consolidation or airspace disease. Left lung infiltrates seen previously are resolved. No pleural effusion. No pneumothorax. Mediastinal contours appear intact. IMPRESSION: Cardiac enlargement with mild vascular congestion. Electronically Signed   By: Burman Nieves M.D.   On: 06/30/2023 17:07     Assessment and Plan:   Rose [name] is a 68 y.o. 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  Rose [name] presents today in an atrial arrhythmia.  She has ECG features of counterclockwise atrial flutter in the setting of a recent surgical mitral valve replacement.  I suspect her chest pain is related to her arrhythmia.  Her risk factors for progression include mild left atrial enlargement, untreated sleep apnea, obesity, and COPD.  She also has evidence of iron deficiency anemia that will need to be addressed.  She is currently rate controlled on a diltiazem drip.  Okay to continue for now.  She does not appear grossly volume overloaded.  She will likely require CTI ablation for definitive management. We will consider pursing electrical cardioversion during this admission.   She is on warfarin in the setting of recent surgical mitral replacement and paroxysmal atrial fibrillation.  Her INR is markedly subtherapeutic.  I am concerned for adherence to her warfarin therapy.  He will need to touch base with the cardiothoracic team about transitioning to an alternative regimen, such as antiplatelet therapy or DOAC.   #Recommendations -start IV heparin  -continue warfarin: INR goal 2.5-3.5 -EP will follow -Reach out to CTS regarding transition to  alternative therapy -treat for iron deficiency anemia   Risk Assessment/Risk Scores:          CHA2DS2-VASc Score =     This indicates a  % annual risk of stroke. The patient's score is based upon:           For questions or updates, please contact  [facility] Please consult [email or web address] for contact info under    Signed, Arin Peral A Derrell Lolling,  MD  06/30/2023 9:10 PM

## 2023-06-30 NOTE — ED Provider Notes (Addendum)
[place] EMERGENCY DEPARTMENT AT [facility] Provider Note   CSN: [id number] Arrival date & time: 06/30/23  1616     History  Chief Complaint  Patient presents with   Chest Pain    Rose [name] is a 68 y.o. female.  HPI  Patient with multiple medical issues including COPD, arrhythmia, CAD presents with chest pain for 3 days with worsening dyspnea.patient notes that she is on home oxygen.  History is notable for mitral valve replacement, Coumadin use.  Patient is also a current smoker.  Over the past 3 days she has had worsening pain.  EMS reports the patient was awake, alert, throughout transport.  EMS rhythm strip with arrhythmia.    Home Medications Prior to Admission medications   Medication Sig Start Date End Date Taking? Authorizing Provider  acetaminophen (TYLENOL) 500 MG tablet Take 500 mg by mouth every 6 (six) hours as needed for mild pain (pain score 1-3) or headache.    [provider]  albuterol (VENTOLIN HFA) 108 (90 Base) MCG/ACT inhaler SMARTSIG:1 Puff(s) Via Inhaler Every 6 Hours PRN 06/13/23   [provider]  amoxicillin-clavulanate (AUGMENTIN) 875-125 MG tablet Take 1 tablet by mouth every 12 (twelve) hours. 06/22/23   Gwyneth Sprout, MD  clonazePAM (KLONOPIN) 1 MG tablet Take 1 mg by mouth 3 (three) times daily.    [provider]  clotrimazole (MYCELEX) 10 MG troche SMARTSIG:1 Lozenge(s) By Mouth 5 Times Daily 05/25/23   [provider]  Fluticasone-Umeclidin-Vilant (TRELEGY ELLIPTA) 200-62.5-25 MCG/ACT AEPB Inhale 200 mcg into the lungs daily as needed.    [provider]  gabapentin (NEURONTIN) 800 MG tablet Take 800 mg by mouth 3 (three) times daily.    [provider]  metFORMIN (GLUCOPHAGE) 500 MG tablet Take 500 mg by mouth 2 (two) times daily. 03/05/19   [provider]  midodrine (PROAMATINE) 10 MG tablet Take 1 tablet (10 mg total) by mouth 3 (three) times daily with meals.  05/22/23   Stehler, Oren Bracket, PA-C  omeprazole (PRILOSEC) 40 MG capsule Take 40 mg by mouth daily. 01/23/22   [provider]  ondansetron (ZOFRAN) 4 MG tablet Take 4 mg by mouth every 8 (eight) hours as needed for nausea or vomiting.    [provider]  OXYGEN Inhale 2 L into the lungs continuous.    [provider]  Pancrelipase, Lip-Prot-Amyl, (CREON) 24000-76000 units CPEP Take 2 capsules by mouth 3 (three) times daily after meals.    [provider]  rOPINIRole (REQUIP) 1 MG tablet Take 1 mg by mouth at bedtime.    [provider]  rosuvastatin (CRESTOR) 40 MG tablet Take 1 tablet (40 mg total) by mouth at bedtime. 05/22/23 08/20/23  Stehler, Oren Bracket, PA-C  sertraline (ZOLOFT) 50 MG tablet Take 50 mg by mouth every morning. 01/11/23   [provider]  traMADol (ULTRAM) 50 MG tablet Take 50-100 mg by mouth every 6 (six) hours as needed for moderate pain (pain score 4-6).    [provider]  traZODone (DESYREL) 50 MG tablet Take 150 mg by mouth at bedtime.    [provider]  warfarin (COUMADIN) 2.5 MG tablet Take 1-2 tablets Daily or as prescribed by [facility] Patient taking differently: Take 2.5 mg by mouth daily. 5mg  (2.5 mg x 2) every Mon, Wed, Fri; 2.5 mg (2.5 mg x 1) all other days 06/15/23   Jake Bathe, MD      Allergies    Erythromycin,  Nickel, Oxycodone, Meperidine, Nicotine, Codeine, Hydrocodone-acetaminophen, Morphine, Propoxyphene, and Quetiapine    Review of Systems   Review of Systems  Physical Exam Updated Vital Signs BP 133/70   Pulse (!) 51   Temp 99.2 F (37.3 C)   Resp 17   Ht 5\' 3"  (1.6 m)   Wt 89.4 kg   SpO2 99%   BMI 34.89 kg/m  Physical Exam Vitals and nursing note reviewed.  Constitutional:      General: She is not in acute distress.    Appearance: She is well-developed. She is ill-appearing.  HENT:     Head: Normocephalic and atraumatic.  Eyes:     Conjunctiva/sclera:  Conjunctivae normal.  Cardiovascular:     Rate and Rhythm: Normal rate. Rhythm irregular.  Pulmonary:     Effort: Pulmonary effort is normal. No respiratory distress.     Breath sounds: No stridor. Decreased breath sounds and wheezing present.  Abdominal:     General: There is no distension.  Skin:    General: Skin is warm and dry.  Neurological:     Mental Status: She is alert and oriented to person, place, and time.     Cranial Nerves: No cranial nerve deficit.  Psychiatric:        Mood and Affect: Mood normal.     ED Results / Procedures / Treatments   Labs (all labs ordered are listed, but only abnormal results are displayed) Labs Reviewed  CBC - Abnormal; Notable for the following components:      Result Value   RBC 3.68 (*)    Hemoglobin 9.3 (*)    HCT 29.7 (*)    MCH 25.3 (*)    All other components within normal limits  COMPREHENSIVE METABOLIC PANEL - Abnormal; Notable for the following components:   Glucose, Bld 125 (*)    Creatinine, Ser 1.01 (*)    Albumin 3.0 (*)    Alkaline Phosphatase 164 (*)    All other components within normal limits  BRAIN NATRIURETIC PEPTIDE - Abnormal; Notable for the following components:   B Natriuretic Peptide 292.6 (*)    All other components within normal limits  PROTIME-INR - Abnormal; Notable for the following components:   Prothrombin Time 15.9 (*)    INR 1.3 (*)    All other components within normal limits  CBG MONITORING, ED - Abnormal; Notable for the following components:   Glucose-Capillary 119 (*)    All other components within normal limits  RESP PANEL BY RT-PCR (RSV, FLU A&B, COVID)  [id number]  TROPONIN I (HIGH SENSITIVITY)  TROPONIN I (HIGH SENSITIVITY)    EKG None  Radiology DG Chest Portable 1 View Result Date: 06/30/2023 CLINICAL DATA:  Chest pain and cough. Diagnosed with pneumonia in the twenty-third. Finished antibiotics but chest pain increasing. Atrial fibrillation. EXAM: PORTABLE CHEST 1 VIEW COMPARISON:   06/21/2023 FINDINGS: Cardiac enlargement with mild central vascular congestion. No focal consolidation or airspace disease. Left lung infiltrates seen previously are resolved. No pleural effusion. No pneumothorax. Mediastinal contours appear intact. IMPRESSION: Cardiac enlargement with mild vascular congestion. Electronically Signed   By: Burman Nieves M.D.   On: 06/30/2023 17:07    Procedures Procedures    Medications Ordered in ED Medications  furosemide (LASIX) injection 20 mg (has no administration in time range)  methylPREDNISolone sodium succinate (SOLU-MEDROL) 125 mg/2 mL injection 125 mg (has no administration in time range)  diltiazem (CARDIZEM) 125 mg in dextrose 5% 125 mL (1 mg/mL) infusion (has no administration  in time range)  aspirin chewable tablet 324 mg (324 mg Oral Given 06/30/23 1721)  albuterol (PROVENTIL) (2.5 MG/3ML) 0.083% nebulizer solution 2.5 mg (2.5 mg Nebulization Given 06/30/23 1721)    ED Course/ Medical Decision Making/ A&P                                 Medical Decision Making Chronically ill-appearing adult female with multiple medical issues including COPD, CAD, presents with chest pain, dyspnea.  Patient is awake, alert, afebrile, speaking clearly, no early evidence for encephalopathy, bacteremia, sepsis.  Concern for pneumonia, COPD exacerbation, CAD. Cardiac 95/105, likely A-fib abnormal Pulse ox 100% with 2 L nasal cannula abnormal   Amount and/or Complexity of Data Reviewed Independent Historian: EMS External Data Reviewed: notes. Labs: ordered. Decision-making details documented in ED Course. Radiology: ordered and independent interpretation performed. Decision-making details documented in ED Course. ECG/medicine tests: ordered. Decision-making details documented in ED Course.  Risk OTC drugs. Prescription drug management. Decision regarding hospitalization. Diagnosis or treatment significantly limited by social determinants of  health.   Update: Patient with bronchodilator, relatively same condition, tachycardia, variable, 90, 110   View notable for multiple recent visits, ongoing questionable domicile status, though the patient notes that she is comfortable where she is living, has access to her medications and to oxygen.  7:36 PM On repeat exam patient continues to have mild tachycardia, heart rate variable, 90s, 100.  A-fib.  Patient's Coumadin value was subtherapeutic, and with unclear onset she is not a candidate for cardioversion.  Patient will start Cardizem drip, and with consideration of multifactorial chest tightness, difficulty breathing, likely related to accommodation of ongoing smoking, A-fib, COPD and with elevated BNP some consideration of CHF versus artifact secondary to tachycardia patient has received Lasix Solu-Medrol as well.  Patient has received bronchodilators, respiratory condition has improved.  Case discussed with cardiology fellow (Dr. Derrell Lolling) who will follow as a consulting service.  Patient now on diltiazem, heart rate 105   CRITICAL CARE Performed by: Gerhard Munch Total critical care time: 35 minutes Critical care time was exclusive of separately billable procedures and treating other patients. Critical care was necessary to treat or prevent imminent or life-threatening deterioration. Critical care was time spent personally by me on the following activities: development of treatment plan with patient and/or surrogate as well as nursing, discussions with consultants, evaluation of patient's response to treatment, examination of patient, obtaining history from patient or surrogate, ordering and performing treatments and interventions, ordering and review of laboratory studies, ordering and review of radiographic studies, pulse oximetry and re-evaluation of patient's condition.  Final Clinical Impression(s) / ED Diagnoses Final diagnoses:  COPD exacerbation (HCC)  Persistent atrial  fibrillation [facility])    Rx / DC Orders ED Discharge Orders     None         Gerhard Munch, MD 06/30/23 Ninfa Linden    Gerhard Munch, MD 06/30/23 2021    Gerhard Munch, MD 06/30/23 [phone]

## 2023-07-01 ENCOUNTER — Observation Stay (HOSPITAL_BASED_OUTPATIENT_CLINIC_OR_DEPARTMENT_OTHER): Payer: 59

## 2023-07-01 DIAGNOSIS — D638 Anemia in other chronic diseases classified elsewhere: Secondary | ICD-10-CM | POA: Diagnosis present

## 2023-07-01 DIAGNOSIS — I272 Pulmonary hypertension, unspecified: Secondary | ICD-10-CM | POA: Diagnosis present

## 2023-07-01 DIAGNOSIS — I639 Cerebral infarction, unspecified: Secondary | ICD-10-CM | POA: Diagnosis not present

## 2023-07-01 DIAGNOSIS — I509 Heart failure, unspecified: Secondary | ICD-10-CM | POA: Diagnosis not present

## 2023-07-01 DIAGNOSIS — Z6835 Body mass index (BMI) 35.0-35.9, adult: Secondary | ICD-10-CM | POA: Diagnosis not present

## 2023-07-01 DIAGNOSIS — I499 Cardiac arrhythmia, unspecified: Secondary | ICD-10-CM

## 2023-07-01 DIAGNOSIS — J9611 Chronic respiratory failure with hypoxia: Secondary | ICD-10-CM | POA: Diagnosis present

## 2023-07-01 DIAGNOSIS — I4892 Unspecified atrial flutter: Secondary | ICD-10-CM | POA: Diagnosis not present

## 2023-07-01 DIAGNOSIS — I5043 Acute on chronic combined systolic (congestive) and diastolic (congestive) heart failure: Secondary | ICD-10-CM | POA: Diagnosis present

## 2023-07-01 DIAGNOSIS — I10 Essential (primary) hypertension: Secondary | ICD-10-CM | POA: Diagnosis not present

## 2023-07-01 DIAGNOSIS — E66811 Obesity, class 1: Secondary | ICD-10-CM | POA: Diagnosis present

## 2023-07-01 DIAGNOSIS — I340 Nonrheumatic mitral (valve) insufficiency: Secondary | ICD-10-CM | POA: Diagnosis not present

## 2023-07-01 DIAGNOSIS — I700 Atherosclerosis of aorta: Secondary | ICD-10-CM | POA: Diagnosis present

## 2023-07-01 DIAGNOSIS — G2581 Restless legs syndrome: Secondary | ICD-10-CM | POA: Diagnosis present

## 2023-07-01 DIAGNOSIS — I4819 Other persistent atrial fibrillation: Secondary | ICD-10-CM | POA: Diagnosis present

## 2023-07-01 DIAGNOSIS — I483 Typical atrial flutter: Secondary | ICD-10-CM | POA: Diagnosis present

## 2023-07-01 DIAGNOSIS — E7800 Pure hypercholesterolemia, unspecified: Secondary | ICD-10-CM | POA: Diagnosis present

## 2023-07-01 DIAGNOSIS — Z953 Presence of xenogenic heart valve: Secondary | ICD-10-CM | POA: Diagnosis not present

## 2023-07-01 DIAGNOSIS — R079 Chest pain, unspecified: Secondary | ICD-10-CM | POA: Diagnosis present

## 2023-07-01 DIAGNOSIS — G4733 Obstructive sleep apnea (adult) (pediatric): Secondary | ICD-10-CM | POA: Diagnosis present

## 2023-07-01 DIAGNOSIS — Z7901 Long term (current) use of anticoagulants: Secondary | ICD-10-CM | POA: Diagnosis not present

## 2023-07-01 DIAGNOSIS — K760 Fatty (change of) liver, not elsewhere classified: Secondary | ICD-10-CM | POA: Diagnosis present

## 2023-07-01 DIAGNOSIS — I361 Nonrheumatic tricuspid (valve) insufficiency: Secondary | ICD-10-CM | POA: Diagnosis not present

## 2023-07-01 DIAGNOSIS — Z9981 Dependence on supplemental oxygen: Secondary | ICD-10-CM | POA: Diagnosis not present

## 2023-07-01 DIAGNOSIS — F17210 Nicotine dependence, cigarettes, uncomplicated: Secondary | ICD-10-CM | POA: Diagnosis present

## 2023-07-01 DIAGNOSIS — D509 Iron deficiency anemia, unspecified: Secondary | ICD-10-CM | POA: Diagnosis present

## 2023-07-01 DIAGNOSIS — I4891 Unspecified atrial fibrillation: Secondary | ICD-10-CM | POA: Diagnosis not present

## 2023-07-01 DIAGNOSIS — I110 Hypertensive heart disease with heart failure: Secondary | ICD-10-CM | POA: Diagnosis not present

## 2023-07-01 DIAGNOSIS — I5033 Acute on chronic diastolic (congestive) heart failure: Secondary | ICD-10-CM | POA: Diagnosis not present

## 2023-07-01 DIAGNOSIS — Z1152 Encounter for screening for COVID-19: Secondary | ICD-10-CM | POA: Diagnosis not present

## 2023-07-01 DIAGNOSIS — K219 Gastro-esophageal reflux disease without esophagitis: Secondary | ICD-10-CM | POA: Diagnosis present

## 2023-07-01 DIAGNOSIS — I071 Rheumatic tricuspid insufficiency: Secondary | ICD-10-CM | POA: Diagnosis present

## 2023-07-01 DIAGNOSIS — I2510 Atherosclerotic heart disease of native coronary artery without angina pectoris: Secondary | ICD-10-CM | POA: Diagnosis present

## 2023-07-01 DIAGNOSIS — E119 Type 2 diabetes mellitus without complications: Secondary | ICD-10-CM | POA: Diagnosis present

## 2023-07-01 DIAGNOSIS — J441 Chronic obstructive pulmonary disease with (acute) exacerbation: Secondary | ICD-10-CM | POA: Diagnosis present

## 2023-07-01 LAB — COMPREHENSIVE METABOLIC PANEL
ALT: 12 U/L (ref 0–44)
AST: 16 U/L (ref 15–41)
Albumin: 2.9 g/dL — ABNORMAL LOW (ref 3.5–5.0)
Alkaline Phosphatase: 161 U/L — ABNORMAL HIGH (ref 38–126)
Anion gap: 12 (ref 5–15)
BUN: 11 mg/dL (ref 8–23)
CO2: 25 mmol/L (ref 22–32)
Calcium: 8.7 mg/dL — ABNORMAL LOW (ref 8.9–10.3)
Chloride: 100 mmol/L (ref 98–111)
Creatinine, Ser: 0.99 mg/dL (ref 0.44–1.00)
GFR, Estimated: >60 mL/min (ref >60)
Glucose, Bld: 208 mg/dL — ABNORMAL HIGH (ref 70–99)
Potassium: 3.9 mmol/L (ref 3.5–5.1)
Sodium: 137 mmol/L (ref 135–145)
Total Bilirubin: 0.2 mg/dL (ref 0.0–1.2)
Total Protein: 6.6 g/dL (ref 6.5–8.1)

## 2023-07-01 LAB — CBC
HCT: 29.3 % — ABNORMAL LOW (ref 36.0–46.0)
Hemoglobin: 9.2 g/dL — ABNORMAL LOW (ref 12.0–15.0)
MCH: 25.1 pg — ABNORMAL LOW (ref 26.0–34.0)
MCHC: 31.4 g/dL (ref 30.0–36.0)
MCV: 79.8 fL — ABNORMAL LOW (ref 80.0–100.0)
Platelets: 233 10*3/uL (ref 150–400)
RBC: 3.67 MIL/uL — ABNORMAL LOW (ref 3.87–5.11)
RDW: 15.1 % (ref 11.5–15.5)
WBC: 3.9 10*3/uL — ABNORMAL LOW (ref 4.0–10.5)
nRBC: 0 % (ref 0.0–0.2)

## 2023-07-01 LAB — FOLATE: Folate: 17 ng/mL (ref >5.9)

## 2023-07-01 LAB — CBG MONITORING, ED
Glucose-Capillary: 181 mg/dL — ABNORMAL HIGH (ref 70–99)
Glucose-Capillary: 195 mg/dL — ABNORMAL HIGH (ref 70–99)
Glucose-Capillary: 214 mg/dL — ABNORMAL HIGH (ref 70–99)
Glucose-Capillary: 217 mg/dL — ABNORMAL HIGH (ref 70–99)
Glucose-Capillary: 223 mg/dL — ABNORMAL HIGH (ref 70–99)
Glucose-Capillary: 223 mg/dL — ABNORMAL HIGH (ref 70–99)
Glucose-Capillary: 247 mg/dL — ABNORMAL HIGH (ref 70–99)
Glucose-Capillary: 257 mg/dL — ABNORMAL HIGH (ref 70–99)

## 2023-07-01 LAB — HEPARIN LEVEL (UNFRACTIONATED)
Heparin Unfractionated: 0.25 [IU]/mL — ABNORMAL LOW (ref 0.30–0.70)
Heparin Unfractionated: 0.4 [IU]/mL (ref 0.30–0.70)

## 2023-07-01 LAB — ECHOCARDIOGRAM COMPLETE
AR max vel: 1.82 cm2
AV Area VTI: 1.7 cm2
AV Area mean vel: 1.87 cm2
AV Mean grad: 7 mm[Hg]
AV Peak grad: 12.4 mm[Hg]
Ao pk vel: 1.76 m/s
Area-P 1/2: 3.68 cm2
Height: 63 in
MV VTI: 1.65 cm2
S' Lateral: 3 cm
Weight: 3151.7 [oz_av]

## 2023-07-01 LAB — MRSA NEXT GEN BY PCR, NASAL: MRSA by PCR Next Gen: NOT DETECTED

## 2023-07-01 LAB — PROTIME-INR
INR: 1.3 — ABNORMAL HIGH (ref 0.8–1.2)
Prothrombin Time: 16.4 s — ABNORMAL HIGH (ref 11.4–15.2)

## 2023-07-01 LAB — MAGNESIUM: Magnesium: 2.1 mg/dL (ref 1.7–2.4)

## 2023-07-01 LAB — PREALBUMIN: Prealbumin: 16 mg/dL — ABNORMAL LOW (ref 18–38)

## 2023-07-01 LAB — PHOSPHORUS: Phosphorus: 3.8 mg/dL (ref 2.5–4.6)

## 2023-07-01 LAB — HIV ANTIBODY (ROUTINE TESTING W REFLEX): HIV Screen 4th Generation wRfx: NONREACTIVE

## 2023-07-01 LAB — VITAMIN B12: Vitamin B-12: 226 pg/mL (ref 180–914)

## 2023-07-01 MED ORDER — HEPARIN BOLUS VIA INFUSION
2000.0000 [IU] | Freq: Once | INTRAVENOUS | Status: AC
  Administered 2023-07-01: 2000 [IU] via INTRAVENOUS
  Filled 2023-07-01: qty 2000

## 2023-07-01 MED ORDER — POTASSIUM CHLORIDE CRYS ER 20 MEQ PO TBCR
40.0000 meq | EXTENDED_RELEASE_TABLET | Freq: Once | ORAL | Status: AC
  Administered 2023-07-01: 40 meq via ORAL
  Filled 2023-07-01: qty 2

## 2023-07-01 MED ORDER — WARFARIN - PHARMACIST DOSING INPATIENT: Freq: Every day | Status: DC

## 2023-07-01 MED ORDER — GABAPENTIN 400 MG PO CAPS
400.0000 mg | ORAL_CAPSULE | Freq: Three times a day (TID) | ORAL | Status: DC
  Administered 2023-07-01 – 2023-07-03 (×5): 400 mg via ORAL
  Filled 2023-07-01 (×5): qty 1

## 2023-07-01 MED ORDER — FUROSEMIDE 10 MG/ML IJ SOLN
40.0000 mg | Freq: Two times a day (BID) | INTRAMUSCULAR | Status: AC
Start: 2023-07-01 — End: 2023-07-01
  Administered 2023-07-01 (×2): 40 mg via INTRAVENOUS
  Filled 2023-07-01 (×2): qty 4

## 2023-07-01 MED ORDER — WARFARIN SODIUM 5 MG PO TABS
5.0000 mg | ORAL_TABLET | Freq: Once | ORAL | Status: AC
  Administered 2023-07-01: 5 mg via ORAL
  Filled 2023-07-01: qty 1

## 2023-07-01 MED ORDER — HEPARIN BOLUS VIA INFUSION
1000.0000 [IU] | Freq: Once | INTRAVENOUS | Status: AC
  Administered 2023-07-01: 1000 [IU] via INTRAVENOUS
  Filled 2023-07-01: qty 1000

## 2023-07-01 NOTE — Progress Notes (Addendum)
   [facility] has been requested to perform a transesophageal echocardiogram on [facility] for presynchronized cardioversion for atrial flutter.    Upon discussion with the patient, she is a poor historian/she reports history of esophagus problem that required procedure for dilatation, but is not sure if she had esophageal stricture.  She states this occurred several years ago at [facility] by Dr. Loman Chroman.    Upon reviewing records on care everywhere, esophagram from 07/17/2018 revealed moderate spontaneous gastroesophageal reflux, no hiatal hernia or stricture, no esophageal mass or ulceration.  There is somewhat delayed peristalsis in the proximal half of the esophagus within the 13 mm tablet.  EGD from 08/12/2018 at [facility] report is not available.    Based off esophagram report from 2020, she does not have absolute contraindication for TEE, will defer further review to cardiologist who is performing the procedure.    The patient has: History of Obstructive Sleep Apnea      After careful review of history and examination, the risks and benefits of transesophageal echocardiogram have been explained including risks of esophageal damage, perforation (1:10,000 risk), bleeding, pharyngeal hematoma as well as other potential complications associated with conscious sedation including aspiration, arrhythmia, respiratory failure and death. Alternatives to treatment were discussed, questions were answered. Patient is willing to proceed.    She is on Coumadin, INR subtherapeutic, currently started on IV heparin drip for bridging.  NPO after midnight, TEE/DCCV orders not placed yet until this is scheduled.    Signed, Cyndi Bender, NP  07/01/2023 11:47 AM

## 2023-07-01 NOTE — Progress Notes (Signed)
PROGRESS NOTE    Rose [name]  ZOX:[id number] DOB: 1955-12-12 DOA: 06/30/2023 PCP: Cristino Martes, NP  Chronically ill 68/F with COPD, ongoing tobacco abuse, CAD, recent mitral valve replacement bioprosthetic valve 12/24, type 2 diabetes mellitus, history of pancreatitis, urogenital implant, OSA, CVA presented to the ED with chest pain and shortness of breath.  Reports being recently diagnosed with pneumonia on 1/23, completed antibiotics.  Patient reports ongoing chest pain since open heart surgery over a month ago, but worsening dyspnea recently, after surgery 12/13 developed transient A-fib, was discharged on Coumadin In the ED VSS, BNP 292, troponin 11, 12, INR 1.3, hemoglobin 9.3, creatinine 1.0, flu and RSV PCR negative, CTA chest with no PE and no acute pulmonary findings, noted to be in atrial flutter  Subjective: - ongoing chest pain for over a month, recently with worsening dyspnea, some edema  Assessment and Plan:  Acute on chronic diastolic CHF (congestive heart failure) (HCC) -Last echo 12/24 before valve replacement with EF 60%, indeterminate diastolic function, RV low normal, severe mitral regurgitation and stenosis -Follow-up repeat echo, continue IV Lasix today -Cards following, may benefit from cardioversion this admission  Recent MVR -Follow-up repeat echo  Ongoing chest pain -For over a month, likely related to recent MVR, CTA chest without acute findings, supportive care   Atrial flutter -Currently on diltiazem, follow-up echo, could transition to beta-blockers tomorrow  -INR subtherapeutic on warfarin, continue IV heparin, warfarin goal 2-3, she has a bioprosthetic valve -May benefit from cardioversion   Tobacco abuse  - Counseled    Type 2 diabetes mellitus without complications (HCC)  - Order Sensitive  SSI    -  check TSH and HgA1C  - Hold by mouth medications    COPD/chronic respiratory failure on 2 L home O2 -Clinically do not suspect  exacerbation -Continue nebs, discontinue steroids  DVT prophylaxis:  Heparin drip Code Status: Full code Family Communication: None present Disposition Plan: Home likely 48 hours  Consultants:    Procedures:   Antimicrobials:    Objective: Vitals:   07/01/23 0800 07/01/23 0830 07/01/23 0900 07/01/23 0915  BP: 122/71 103/81 116/63   Pulse: 71 61 71 72  Resp: 20 16 (!) 6 15  Temp:      TempSrc:      SpO2: 94% 92% 93% 92%  Weight:      Height:        Intake/Output Summary (Last 24 hours) at 07/01/2023 1000 Last data filed at 07/01/2023 0420 Gross per 24 hour  Intake 100 ml  Output 750 ml  Net -650 ml   Filed Weights   06/30/23 1621  Weight: 89.4 kg    Examination:  General exam: Clear ill pleasant female sitting up in bed, AAOx3 HEENT: Positive JVD CVS: S1-S2, regular rhythm Lungs: Decreased breath sounds at the bases otherwise clear Abdomen: Soft, nontender, bowel sounds present Extremities: Trace edema Skin: No rashes Psychiatry:  Mood & affect appropriate.     Data Reviewed:   CBC: Recent Labs  Lab 06/30/23 1725 07/01/23 0501  WBC 6.9 3.9*  HGB 9.3* 9.2*  HCT 29.7* 29.3*  MCV 80.7 79.8*  PLT 224 233   Basic Metabolic Panel: Recent Labs  Lab 06/30/23 1725 06/30/23 2040 07/01/23 0501  NA 139  --  137  K 3.9  --  3.9  CL 103  --  100  CO2 24  --  25  GLUCOSE 125*  --  208*  BUN 8  --  11  CREATININE 1.01*  --  0.99  CALCIUM 8.9  --  8.7*  MG  --  1.9 2.1  PHOS  --  3.8 3.8   GFR: Estimated Creatinine Clearance: 59.3 mL/min (by C-G formula based on SCr of 0.99 mg/dL). Liver Function Tests: Recent Labs  Lab 06/30/23 1725 06/30/23 2040 07/01/23 0501  AST 16 16 16   ALT 13 12 12   ALKPHOS 164* 164* 161*  BILITOT 0.4 0.3 0.2  PROT 6.5 6.6 6.6  ALBUMIN 3.0* 3.0* 2.9*   Recent Labs  Lab 06/30/23 2040  LIPASE 27   No results for input(s): "AMMONIA" in the last 168 hours. Coagulation Profile: Recent Labs  Lab 06/30/23 1725  07/01/23 0501  INR 1.3* 1.3*   Cardiac Enzymes: Recent Labs  Lab 06/30/23 2040  CKTOTAL 37*   BNP (last 3 results) No results for input(s): "PROBNP" in the last 8760 hours. HbA1C: Recent Labs    06/30/23 2040  HGBA1C 6.3*   CBG: Recent Labs  Lab 06/30/23 2126 06/30/23 2128 07/01/23 0012 07/01/23 0413 07/01/23 0744  GLUCAP 193* 228* 217* 214* 181*   Lipid Profile: No results for input(s): "CHOL", "HDL", "LDLCALC", "TRIG", "CHOLHDL", "LDLDIRECT" in the last 72 hours. Thyroid Function Tests: Recent Labs    06/30/23 2040  TSH 1.510   Anemia Panel: Recent Labs    06/30/23 2040 07/01/23 0501  VITAMINB12  --  226  FOLATE  --  17.0  FERRITIN 17  --   TIBC 455*  --   IRON 29  --   RETICCTPCT 2.0  --    Urine analysis:    Component Value Date/Time   COLORURINE YELLOW 06/30/2023 2129   APPEARANCEUR CLEAR 06/30/2023 2129   LABSPEC 1.015 06/30/2023 2129   PHURINE 5.0 06/30/2023 2129   GLUCOSEU NEGATIVE 06/30/2023 2129   HGBUR NEGATIVE 06/30/2023 2129   BILIRUBINUR NEGATIVE 06/30/2023 2129   KETONESUR NEGATIVE 06/30/2023 2129   PROTEINUR NEGATIVE 06/30/2023 2129   UROBILINOGEN 0.2 02/03/2009 1447   NITRITE NEGATIVE 06/30/2023 2129   LEUKOCYTESUR NEGATIVE 06/30/2023 2129   Sepsis Labs: @LABRCNTIP (procalcitonin:4,lacticidven:4)  ) Recent Results (from the past 240 hours)  Resp panel by RT-PCR (RSV, Flu A&B, Covid) Anterior Nasal Swab     Status: None   Collection Time: 06/30/23  4:33 PM   Specimen: Anterior Nasal Swab  Result Value Ref Range Status   SARS Coronavirus 2 by RT PCR NEGATIVE NEGATIVE Final   Influenza A by PCR NEGATIVE NEGATIVE Final   Influenza B by PCR NEGATIVE NEGATIVE Final    Comment: (NOTE) The Xpert Xpress SARS-CoV-2/FLU/RSV plus assay is intended as an aid in the diagnosis of influenza from Nasopharyngeal swab specimens and should not be used as a sole basis for treatment. Nasal washings and aspirates are unacceptable for Xpert  Xpress SARS-CoV-2/FLU/RSV testing.  Fact Sheet for Patients: BloggerCourse.com  Fact Sheet for Healthcare Providers: SeriousBroker.it  This test is not yet approved or cleared by the [place] FDA and has been authorized for detection and/or diagnosis of SARS-CoV-2 by FDA under an Emergency Use Authorization (EUA). This EUA will remain in effect (meaning this test can be used) for the duration of the COVID-19 declaration under Section 564(b)(1) of the Act, 21 U.S.C. section 360bbb-3(b)(1), unless the authorization is terminated or revoked.     Resp Syncytial Virus by PCR NEGATIVE NEGATIVE Final    Comment: (NOTE) Fact Sheet for Patients: BloggerCourse.com  Fact Sheet for Healthcare Providers: SeriousBroker.it  This test is not yet approved or cleared by the [place] FDA  and has been authorized for detection and/or diagnosis of SARS-CoV-2 by FDA under an Emergency Use Authorization (EUA). This EUA will remain in effect (meaning this test can be used) for the duration of the COVID-19 declaration under Section 564(b)(1) of the Act, 21 U.S.C. section 360bbb-3(b)(1), unless the authorization is terminated or revoked.  Performed at [facility], [address], [place], [place] [address]   MRSA Next Gen by PCR, Nasal     Status: None   Collection Time: 06/30/23 11:37 PM   Specimen: Nasal Mucosa; Nasal Swab  Result Value Ref Range Status   MRSA by PCR Next Gen NOT DETECTED NOT DETECTED Final    Comment: (NOTE) The GeneXpert MRSA Assay (FDA approved for NASAL specimens only), is one component of a comprehensive MRSA colonization surveillance program. It is not intended to diagnose MRSA infection nor to guide or monitor treatment for MRSA infections. Test performance is not FDA approved in patients less than 47 years old. Performed at [facility], [address] [place], [place] [address]      Radiology Studies: CT Angio Chest Pulmonary Embolism (PE) W or WO Contrast Result Date: 06/30/2023 CLINICAL DATA:  Chest pain, dyspnea EXAM: CT ANGIOGRAPHY CHEST WITH CONTRAST TECHNIQUE: Multidetector CT imaging of the chest was performed using the standard protocol during bolus administration of intravenous contrast. Multiplanar CT image reconstructions and MIPs were obtained to evaluate the vascular anatomy. RADIATION DOSE REDUCTION: This exam was performed according to the departmental dose-optimization program which includes automated exposure control, adjustment of the mA and/or kV according to patient size and/or use of iterative reconstruction technique. CONTRAST:  75mL OMNIPAQUE IOHEXOL 350 MG/ML SOLN COMPARISON:  06/06/2023 FINDINGS: Cardiovascular: There is adequate opacification of the pulmonary arterial tree. No intraluminal filling defect identified to suggest acute pulmonary embolism. The central pulmonary arteries are caliber. Mitral valve replacement has been performed. Mild global cardiomegaly is present, stable since prior examination. Small pericardial effusion is stable since prior examination. Mild atherosclerotic calcifications again seen within the thoracic aorta. No aortic aneurysm. Mediastinum/Nodes: Shotty mediastinal is present, stable since prior examination and nonspecific, possibly reactive in nature. There is, similarly, infiltration within the anterior mediastinum which appears similar prior examination and is in keeping with recent surgical intervention the esophagus is unremarkable. Visualized thyroid is unremarkable. Lungs/Pleura: Stable band like atelectasis or scarring within the right lower lobe. Mild emphysema. Motion artifact slightly limits evaluation pulmonary parenchyma. No superimposed focal pulmonary infiltrate. No pneumothorax or pleural effusion. Upper Abdomen: A punctate calcifications are noted throughout the visualized  pancreatic parenchyma keeping with changes of pancreatitis. No superimposed acute peripancreatic inflammatory changes identified within visualized abdomen. Status post cholecystectomy. No acute abnormality. Musculoskeletal: Ununited median sternotomy again noted in keeping recent intervention. No acute bone abnormality. No lytic or blastic lesion. Review of the MIP images confirms the above findings. IMPRESSION: 1. No pulmonary embolism. No acute intrathoracic pathology identified. 2. Status post mitral valve replacement. Stable mild global cardiomegaly. Stable small pericardial effusion. 3. Mild emphysema 4. Stable changes of chronic pancreatitis. Aortic Atherosclerosis (ICD10-I70.0) and Emphysema (ICD10-J43.9). Electronically Signed   By: Helyn Numbers M.D.   On: 06/30/2023 23:33   DG Chest Portable 1 View Result Date: 06/30/2023 CLINICAL DATA:  Chest pain and cough. Diagnosed with pneumonia in the twenty-third. Finished antibiotics but chest pain increasing. Atrial fibrillation. EXAM: PORTABLE CHEST 1 VIEW COMPARISON:  06/21/2023 FINDINGS: Cardiac enlargement with mild central vascular congestion. No focal consolidation or airspace disease. Left lung infiltrates  seen previously are resolved. No pleural effusion. No pneumothorax. Mediastinal contours appear intact. IMPRESSION: Cardiac enlargement with mild vascular congestion. Electronically Signed   By: Burman Nieves M.D.   On: 06/30/2023 17:07     Scheduled Meds:  clonazePAM  1 mg Oral TID   gabapentin  800 mg Oral TID   insulin aspart  0-9 Units Subcutaneous Q4H   ipratropium-albuterol  3 mL Nebulization Q6H   midodrine  10 mg Oral TID WC   pantoprazole  40 mg Oral Daily   [START ON 07/02/2023] predniSONE  40 mg Oral Q breakfast   rOPINIRole  1 mg Oral QHS   rosuvastatin  40 mg Oral QHS   sertraline  50 mg Oral q morning   traZODone  150 mg Oral QHS   Continuous Infusions:  cefTRIAXone (ROCEPHIN)  IV Stopped (07/01/23 0017)   diltiazem  (CARDIZEM) infusion 7.5 mg/hr (07/01/23 0814)   heparin 1,200 Units/hr (07/01/23 0814)     LOS: 0 days    Time spent:    Zannie Cove, MD [facility]   07/01/2023, 10:00 AM

## 2023-07-01 NOTE — ED Notes (Signed)
Gave pt a cup of water that she asked for.

## 2023-07-01 NOTE — ED Notes (Signed)
 Transported to ECHO.

## 2023-07-01 NOTE — Progress Notes (Signed)
PHARMACY - ANTICOAGULATION CONSULT NOTE  Pharmacy Consult for heparin & warfarin Indication: atrial fibrillation/mitral valve  Patient Measurements: Height: 5\' 3"  (160 cm) Weight: 89.4 kg (196 lb 15.7 oz) IBW/kg (Calculated) : 52.4 Heparin Dosing Weight: 73 kg  Vital Signs: Temp: 98 F (36.7 C) (02/02 1003) Temp Source: Oral (02/02 1003) BP: 116/63 (02/02 0900) Pulse Rate: 72 (02/02 0915)  Labs: Recent Labs    06/30/23 1725 06/30/23 1833 06/30/23 2040 07/01/23 0501  HGB 9.3*  --   --  9.2*  HCT 29.7*  --   --  29.3*  PLT 224  --   --  233  LABPROT 15.9*  --   --  16.4*  INR 1.3*  --   --  1.3*  CREATININE 1.01*  --   --  0.99  CKTOTAL  --   --  37*  --   TROPONINIHS 11 12  --   --     Estimated Creatinine Clearance: 59.3 mL/min (by C-G formula based on SCr of 0.99 mg/dL).  Medications:  PTA warfarin: 3.75mg  PO every MWF and 2.5 mg PO all other days  Assessment: 31 yoF presented to the ED with chest pain. Pharmacy consulted to dose heparin and warfarin for atrial fibrillation and recent mechanical mitral valve (05/11/2023).  Heparin started at 1200 units/hr following 2000 unit IV bolus. Resulting heparin level is 0.23 which is sub-therapeutic.  No issues with infusion or s/s of bleeding per RN.  Prior to admission warfarin dosing: 3.75 mg (2.5 mg x 1.5) every Mon, Wed, Fri; 2.5 mg (2.5 mg x 1) all other days  INR Monitoring: 2/1: 1.3 -- patient took home dose 2/2 1.3  Hgb 9.3>9.2, plts 224>233  Goal of Therapy:  Heparin level 0.3-0.7 units/ml INR 2.5-3 (per last anticoag clinic note) Monitor platelets by anticoagulation protocol: Yes   Plan:  Heparin: Give 1000 unit IV bolus Heparin Increase heparin infusion to 1350 units/hr Heparin level in 8h  Warfarin: Will give 5 mg warfarin today Repeat dosing per INR  Monitoring: CBC, INR, & heparin level daily Watch for new DDIs Monitor for s/s of hemorrhage  Delmar Landau, PharmD, BCPS 07/01/2023  10:31 AM ED Clinical Pharmacist -  [phone]

## 2023-07-01 NOTE — ED Notes (Addendum)
Pt ambualted to the RR and back. Pt did well. Pt insisted on walking to the RR. Pt stated that her back is hurting from laying in the bed.

## 2023-07-01 NOTE — Progress Notes (Addendum)
   Patient Name: Rose [name] Date of Encounter: 07/01/2023 [facility] Cardiologist: Donato Schultz, MD   Interval Summary  .    No acute overnight events. Patient awaiting hospital bed. Reports feeling improved with rate control.   Vital Signs .    Vitals:   07/01/23 0915 07/01/23 0945 07/01/23 1003 07/01/23 1040  BP:  134/86  (!) 122/57  Pulse: 72 76  83  Resp: 15 20  13   Temp:   98 F (36.7 C)   TempSrc:   Oral   SpO2: 92% 96%  90%  Weight:      Height:        Intake/Output Summary (Last 24 hours) at 07/01/2023 1112 Last data filed at 07/01/2023 0420 Gross per 24 hour  Intake 100 ml  Output 750 ml  Net -650 ml      06/30/2023    4:21 PM 06/21/2023    6:32 PM 05/31/2023    1:48 PM  Last 3 Weights  Weight (lbs) 196 lb 15.7 oz 196 lb 15.7 oz 197 lb  Weight (kg) 89.35 kg 89.35 kg 89.359 kg      Telemetry/ECG    Atrial flutter, rate controlled - Personally Reviewed  Physical Exam .   GEN: No acute distress.   Neck: No JVD Cardiac: Normal rate, regular rhythm Respiratory: Normal work of breathing GI: Soft, non-distended  MS: Trace edema  Assessment & Plan .     Rose [name] is a 68 y.o. 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   #. Atrial flutter: Appears typical by ECG but cannot r/o atypical due to recent cardiac surgery.  #. S/p bioprosthetic MVR for rheumatic mitral valve disease: - Last known EF normal, rate controlled on diltiazem infusion with normal BP. No evidence of low output on dilt. Continue to monitor.  - Arrange for TEE and DCCV tomorrow. NPO after midnight. Once back in sinus rhythm would discontinue  diltiazem and start amiodarone for maintenance of sinus rhythm in the post operative period, up to 90 days.  - Continue heparin gtt with bridge to therapeutic warfarin.  - Repeat echocardiogram ordered.   Patient presented with typical appearing atrial flutter. Rates improved with diltiazem.   For questions or updates, please contact [facility] Please consult [email or web address] for contact info under        Signed, Nobie Putnam, MD

## 2023-07-01 NOTE — Progress Notes (Signed)
PHARMACY - ANTICOAGULATION CONSULT NOTE  Pharmacy Consult for heparin & warfarin Indication: atrial fibrillation/mitral valve  Patient Measurements: Height: 5\' 3"  (160 cm) Weight: 89.4 kg (196 lb 15.7 oz) IBW/kg (Calculated) : 52.4 Heparin Dosing Weight: 73 kg  Vital Signs: Temp: 97.2 F (36.2 C) (02/02 2026) Temp Source: Axillary (02/02 2026) BP: 131/56 (02/02 1900) Pulse Rate: 70 (02/02 1900)  Labs: Recent Labs    06/30/23 1725 06/30/23 1833 06/30/23 2040 07/01/23 0501 07/01/23 1042 07/01/23 2004  HGB 9.3*  --   --  9.2*  --   --   HCT 29.7*  --   --  29.3*  --   --   PLT 224  --   --  233  --   --   LABPROT 15.9*  --   --  16.4*  --   --   INR 1.3*  --   --  1.3*  --   --   HEPARINUNFRC  --   --   --   --  0.25* 0.40  CREATININE 1.01*  --   --  0.99  --   --   CKTOTAL  --   --  37*  --   --   --   TROPONINIHS 11 12  --   --   --   --     Estimated Creatinine Clearance: 59.3 mL/min (by C-G formula based on SCr of 0.99 mg/dL).  Medications:  PTA warfarin: 3.75mg  PO every MWF and 2.5 mg PO all other days  Assessment: 45 yoF presented to the ED with chest pain. Pharmacy consulted to dose heparin and warfarin for atrial fibrillation and recent mechanical mitral valve (05/11/2023).  No issues with infusion or s/s of bleeding per RN.  Prior to admission warfarin dosing: 3.75 mg (2.5 mg x 1.5) every Mon, Wed, Fri; 2.5 mg (2.5 mg x 1) all other days  Heparin levels: 2/2 1042: 0.25 -- on 1200 units/hr following 2000 unit bolus >> given 1000 unit bolus and increased to 1350 units/hr 2/2 2004: 0.4 -- Therapeutic on 1350 units/hr  INR Monitoring: 2/1: 1.3 -- patient took home dose 2/2 1.3  Hgb 9.3>9.2, plts 224>233  Goal of Therapy:  Heparin level 0.3-0.7 units/ml INR 2.5-3 (per last anticoag clinic note) Monitor platelets by anticoagulation protocol: Yes   Plan:  Heparin: Continue heparin at 1350 units/hr Confirmatory heparin level at 0400  Warfarin: 5mg   warfarin dose charted as given for tonight Repeat dosing per INR  Monitoring: CBC, INR, & heparin level daily Watch for new DDIs Monitor for s/s of hemorrhage  Delmar Landau, PharmD, BCPS 07/01/2023 8:45 PM ED Clinical Pharmacist -  [phone]

## 2023-07-01 NOTE — Care Management Obs Status (Signed)
MEDICARE OBSERVATION STATUS NOTIFICATION   Patient Details  Name: Rose [name] MRN: [id number] Date of Birth: Oct 08, 1955   Medicare Observation Status Notification Given:  Yes    Lockie Pares, RN 07/01/2023, 1:13 PM

## 2023-07-02 ENCOUNTER — Inpatient Hospital Stay (HOSPITAL_COMMUNITY): Payer: 59 | Admitting: Anesthesiology

## 2023-07-02 ENCOUNTER — Encounter (HOSPITAL_COMMUNITY): Payer: Self-pay | Admitting: Internal Medicine

## 2023-07-02 ENCOUNTER — Encounter (HOSPITAL_COMMUNITY)
Admission: EM | Disposition: A | Discharge status: Home / Self Care | Payer: Self-pay | Source: Home / Self Care | Attending: Internal Medicine

## 2023-07-02 ENCOUNTER — Inpatient Hospital Stay (HOSPITAL_COMMUNITY): Payer: 59

## 2023-07-02 DIAGNOSIS — I5033 Acute on chronic diastolic (congestive) heart failure: Secondary | ICD-10-CM | POA: Diagnosis not present

## 2023-07-02 DIAGNOSIS — I110 Hypertensive heart disease with heart failure: Secondary | ICD-10-CM | POA: Diagnosis not present

## 2023-07-02 DIAGNOSIS — I483 Typical atrial flutter: Secondary | ICD-10-CM

## 2023-07-02 DIAGNOSIS — I340 Nonrheumatic mitral (valve) insufficiency: Secondary | ICD-10-CM

## 2023-07-02 DIAGNOSIS — I4892 Unspecified atrial flutter: Secondary | ICD-10-CM

## 2023-07-02 DIAGNOSIS — I361 Nonrheumatic tricuspid (valve) insufficiency: Secondary | ICD-10-CM

## 2023-07-02 DIAGNOSIS — I509 Heart failure, unspecified: Secondary | ICD-10-CM | POA: Diagnosis not present

## 2023-07-02 DIAGNOSIS — F17210 Nicotine dependence, cigarettes, uncomplicated: Secondary | ICD-10-CM

## 2023-07-02 DIAGNOSIS — I2510 Atherosclerotic heart disease of native coronary artery without angina pectoris: Secondary | ICD-10-CM

## 2023-07-02 DIAGNOSIS — I4891 Unspecified atrial fibrillation: Secondary | ICD-10-CM | POA: Diagnosis not present

## 2023-07-02 HISTORY — PX: TRANSESOPHAGEAL ECHOCARDIOGRAM (CATH LAB): EP1270

## 2023-07-02 HISTORY — PX: CARDIOVERSION: EP1203

## 2023-07-02 LAB — CBC
HCT: 28.5 % — ABNORMAL LOW (ref 36.0–46.0)
Hemoglobin: 9.1 g/dL — ABNORMAL LOW (ref 12.0–15.0)
MCH: 25.3 pg — ABNORMAL LOW (ref 26.0–34.0)
MCHC: 31.9 g/dL (ref 30.0–36.0)
MCV: 79.2 fL — ABNORMAL LOW (ref 80.0–100.0)
Platelets: 259 10*3/uL (ref 150–400)
RBC: 3.6 MIL/uL — ABNORMAL LOW (ref 3.87–5.11)
RDW: 15.2 % (ref 11.5–15.5)
WBC: 7.6 10*3/uL (ref 4.0–10.5)
nRBC: 0 % (ref 0.0–0.2)

## 2023-07-02 LAB — BASIC METABOLIC PANEL
Anion gap: 11 (ref 5–15)
BUN: 23 mg/dL (ref 8–23)
CO2: 28 mmol/L (ref 22–32)
Calcium: 8.7 mg/dL — ABNORMAL LOW (ref 8.9–10.3)
Chloride: 99 mmol/L (ref 98–111)
Creatinine, Ser: 1.23 mg/dL — ABNORMAL HIGH (ref 0.44–1.00)
GFR, Estimated: 48 mL/min — ABNORMAL LOW (ref >60)
Glucose, Bld: 172 mg/dL — ABNORMAL HIGH (ref 70–99)
Potassium: 3.7 mmol/L (ref 3.5–5.1)
Sodium: 138 mmol/L (ref 135–145)

## 2023-07-02 LAB — GLUCOSE, CAPILLARY
Glucose-Capillary: 193 mg/dL — ABNORMAL HIGH (ref 70–99)
Glucose-Capillary: 227 mg/dL — ABNORMAL HIGH (ref 70–99)
Glucose-Capillary: 157 mg/dL — ABNORMAL HIGH (ref 70–99)

## 2023-07-02 LAB — HEPARIN LEVEL (UNFRACTIONATED)
Heparin Unfractionated: >1.1 [IU]/mL — ABNORMAL HIGH (ref 0.30–0.70)
Heparin Unfractionated: 0.61 [IU]/mL (ref 0.30–0.70)

## 2023-07-02 LAB — CBG MONITORING, ED
Glucose-Capillary: 159 mg/dL — ABNORMAL HIGH (ref 70–99)
Glucose-Capillary: 175 mg/dL — ABNORMAL HIGH (ref 70–99)
Glucose-Capillary: 175 mg/dL — ABNORMAL HIGH (ref 70–99)
Glucose-Capillary: 257 mg/dL — ABNORMAL HIGH (ref 70–99)

## 2023-07-02 LAB — PROTIME-INR
INR: 1.4 — ABNORMAL HIGH (ref 0.8–1.2)
Prothrombin Time: 16.9 s — ABNORMAL HIGH (ref 11.4–15.2)

## 2023-07-02 LAB — ECHO TEE: MV VTI: 1.75 cm2

## 2023-07-02 SURGERY — TRANSESOPHAGEAL ECHOCARDIOGRAM (TEE) (CATHLAB)
Anesthesia: General

## 2023-07-02 MED ORDER — SODIUM CHLORIDE 0.9% FLUSH: 3.0000 mL | Freq: Two times a day (BID) | INTRAVENOUS | Status: DC

## 2023-07-02 MED ORDER — PROPOFOL 10 MG/ML IV BOLUS
INTRAVENOUS | Status: DC | PRN
  Administered 2023-07-02: 50 mg via INTRAVENOUS

## 2023-07-02 MED ORDER — SODIUM CHLORIDE 0.9 % IV SOLN: INTRAVENOUS | Status: DC | PRN

## 2023-07-02 MED ORDER — WARFARIN SODIUM 5 MG PO TABS
5.0000 mg | ORAL_TABLET | Freq: Once | ORAL | Status: AC
  Administered 2023-07-02: 5 mg via ORAL
  Filled 2023-07-02: qty 1

## 2023-07-02 MED ORDER — GUAIFENESIN ER 600 MG PO TB12
600.0000 mg | ORAL_TABLET | Freq: Two times a day (BID) | ORAL | Status: DC
  Administered 2023-07-02 – 2023-07-03 (×2): 600 mg via ORAL
  Filled 2023-07-02 (×2): qty 1

## 2023-07-02 MED ORDER — PROPOFOL 500 MG/50ML IV EMUL
INTRAVENOUS | Status: DC | PRN
  Administered 2023-07-02: 125 ug/kg/min via INTRAVENOUS

## 2023-07-02 MED ORDER — MIDODRINE HCL 5 MG PO TABS
5.0000 mg | ORAL_TABLET | Freq: Two times a day (BID) | ORAL | Status: DC
  Administered 2023-07-02 – 2023-07-03 (×2): 5 mg via ORAL
  Filled 2023-07-02 (×2): qty 1

## 2023-07-02 MED ORDER — SODIUM CHLORIDE 0.9% FLUSH: 3.0000 mL | INTRAVENOUS | Status: DC | PRN

## 2023-07-02 MED ORDER — FUROSEMIDE 40 MG PO TABS
40.0000 mg | ORAL_TABLET | Freq: Every day | ORAL | Status: DC
Start: 2023-07-02 — End: 2023-07-03
  Administered 2023-07-02: 40 mg via ORAL
  Filled 2023-07-02: qty 2

## 2023-07-02 MED ORDER — LEVALBUTEROL HCL 0.63 MG/3ML IN NEBU
0.6300 mg | INHALATION_SOLUTION | Freq: Four times a day (QID) | RESPIRATORY_TRACT | Status: DC | PRN
  Administered 2023-07-02: 0.63 mg via RESPIRATORY_TRACT
  Filled 2023-07-02: qty 3

## 2023-07-02 SURGICAL SUPPLY — 1 items: PAD DEFIB RADIO PHYSIO CONN (PAD) ×1

## 2023-07-02 NOTE — Progress Notes (Signed)
 Heart Failure Navigator Progress Note  Assessed for Heart & Vascular TOC clinic readiness.  Patient does not meet criteria due to EF 55-60%, No HF TOC per Dr. Jomarie Longs.   Navigator will sign off at this time.   Rhae Hammock, BSN, Scientist, clinical (histocompatibility and immunogenetics) Only

## 2023-07-02 NOTE — H&P (View-Only) (Signed)
Patient Name: Rose [name] Date of Encounter: 07/02/2023 [facility] Cardiologist: Donato Schultz, MD   Interval Summary  .    Patient continues with chest pain on coughing, vague sensation of palpitations while in atrial flutter. She is frustrated about still being in the ED.   Vital Signs .    Vitals:   07/02/23 0900 07/02/23 0930 07/02/23 1000 07/02/23 1030  BP: (!) 103/48 (!) 118/59 120/62 127/61  Pulse: 78 77 77 78  Resp: 20 16 (!) 21 18  Temp:      TempSrc:      SpO2: 98% 98% 98% 96%  Weight:      Height:        Intake/Output Summary (Last 24 hours) at 07/02/2023 1110 Last data filed at 07/02/2023 0754 Gross per 24 hour  Intake 674.47 ml  Output 3000 ml  Net -2325.53 ml      06/30/2023    4:21 PM 06/21/2023    6:32 PM 05/31/2023    1:48 PM  Last 3 Weights  Weight (lbs) 196 lb 15.7 oz 196 lb 15.7 oz 197 lb  Weight (kg) 89.35 kg 89.35 kg 89.359 kg      Telemetry/ECG    Rate controlled atrial flutter, 4:1 conduction - Personally Reviewed  Physical Exam .   GEN: No acute distress.   Neck: No JVD Cardiac: RRR, no murmurs, rubs, or gallops.  Respiratory: bilateral upper lobe inspiratory wheezing GI: Soft, nontender, non-distended  MS: No edema  Assessment & Plan .     Rose [name] is a 68 y.o. 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   Atrial flutter  Patient presented to the ED with left side chest pain, shortness of breath, heart fluttering sensation 06/30/23. (Of note, she is s/p MVR with Dr. Leafy Ro on 05/11/23. Post procedure noted with afib and RVR but converted to NSR with amiodarone bolus. She was started  on Coumadin. Patient presented back to the ED on 06/21/23 with left side chest pain, found in rate controlled atrial flutter. No changes made to medications). This admission, patient now with improved rate, persistent atrial flutter on Diltiazem.   Continue IV diltiazem today, pending TEE/DCCV (delayed by patient eating this morning. Scheduled for ~2:30pm).  Consider loading oral amiodarone s/p DCCV.  Continue Warfarin  Acute on chronic diastolic CHF  TTE this admission shows stable/preserved LVEF. Moderately reduced RV function, appropriate functioning of prosthetic mitral valve.   Patient appears euvolemic on exam today. No rales noted on pulmonary exam, upper lobe wheezing only.  Suspect some of her volume issues secondary to arrhythmia. Carefully monitor volume status s/p DCCV to determine need/dose of Lasix.  Consider adding SGLT2/MRA   Atypical chest pain CAD  Patient with left sided chest pain, worse with cough. Symptoms consistent with post-surgical pain, low suspicion for ischemic etiology. Mild disease on [facility] June 2024.   Continue statin, warfarin.   Per primary team: DM type II Tobacco use COPD  For questions or updates, please contact [facility] Please consult [email or web address] for contact info under        Signed, Perlie Gold, PA-C   Personally seen and examined. Agree with above.  68 year old here with atrial flutter 4-1  conduction personally reviewed and interpreted on telemetry with recent mitral valve replacement 29 mm bioprosthetic valve.  Social determinants of health have been an issue.  She also has COPD OSA not on CPAP  She is currently on warfarin and IV heparin.  -IV diltiazem -Plan on TEE cardioversion at 230 today.  Risk and benefits of been discussed and she is willing to proceed.  Understands the importance of not eating prior to procedure. -Appears euvolemic  Donato Schultz, MD

## 2023-07-02 NOTE — ED Notes (Addendum)
Pt transported for TEE. All belongings taken with pt.

## 2023-07-02 NOTE — Progress Notes (Signed)
PHARMACY - ANTICOAGULATION CONSULT NOTE  Pharmacy Consult for heparin & warfarin Indication: atrial fibrillation/mitral valve  Patient Measurements: Height: 5\' 3"  (160 cm) Weight: 89.4 kg (196 lb 15.7 oz) IBW/kg (Calculated) : 52.4 Heparin Dosing Weight: 73 kg  Vital Signs: Temp: 98.5 F (36.9 C) (02/03 0548) Temp Source: Oral (02/03 0548) BP: 117/65 (02/03 0500) Pulse Rate: 73 (02/03 0500)  Labs: Recent Labs    06/30/23 1725 06/30/23 1833 06/30/23 2040 07/01/23 0501 07/01/23 1042 07/01/23 2004 07/02/23 0452 07/02/23 0500  HGB 9.3*  --   --  9.2*  --   --  9.1*  --   HCT 29.7*  --   --  29.3*  --   --  28.5*  --   PLT 224  --   --  233  --   --  259  --   LABPROT 15.9*  --   --  16.4*  --   --   --  16.9*  INR 1.3*  --   --  1.3*  --   --   --  1.4*  HEPARINUNFRC  --   --   --   --  0.25* 0.40  --  >1.10*  CREATININE 1.01*  --   --  0.99  --   --  1.23*  --   CKTOTAL  --   --  37*  --   --   --   --   --   TROPONINIHS 11 12  --   --   --   --   --   --     Estimated Creatinine Clearance: 47.7 mL/min (A) (by C-G formula based on SCr of 1.23 mg/dL (H)).  Medications:  PTA warfarin: 3.75mg  PO every MWF and 2.5 mg PO all other days  Assessment: 33 yoF presented to the ED with chest pain. Pharmacy consulted to dose heparin and warfarin for atrial fibrillation and recent mechanical mitral valve (05/11/2023).  No issues with infusion or s/s of bleeding per RN.  Prior to admission warfarin dosing: 3.75 mg (2.5 mg x 1.5) every Mon, Wed, Fri; 2.5 mg (2.5 mg x 1) all other days  INR subtherapeutic at 1.4, heparin level therapeutic on 1350 units/hr  Goal of Therapy:  Heparin level 0.3-0.7 units/ml INR 2.5-3 (per last anticoag clinic note) Monitor platelets by anticoagulation protocol: Yes   Plan:  Heparin: Continue heparin gtt at 1350 units/hr  Warfarin: Warfarin 5mg  PO x 1 tonight  Monitoring: CBC, INR, & heparin level daily Watch for new DDIs Monitor for s/s  of hemorrhage  Daylene Posey, PharmD, [facility] Clinical Pharmacist ED Pharmacist Phone # [phone] 07/02/2023 7:22 AM

## 2023-07-02 NOTE — Anesthesia Preprocedure Evaluation (Addendum)
Anesthesia Evaluation  Patient identified by MRN, date of birth, ID band Patient awake    Reviewed: Allergy & Precautions, NPO status , Patient's Chart, lab work & pertinent test results  Airway Mallampati: II  TM Distance: >3 FB Neck ROM: Full    Dental  (+) Dental Advisory Given, Edentulous Upper, Poor Dentition, Missing   Pulmonary asthma , sleep apnea (does not use cpap) , COPD,  oxygen dependent, Current Smoker and Patient abstained from smoking.   Pulmonary exam normal breath sounds clear to auscultation       Cardiovascular pulmonary hypertension (mild pHTN)+ CAD and +CHF (moderate reduction in RV systolic fx, normal LVEF)  Normal cardiovascular exam+ dysrhythmias (coumadin) Atrial Fibrillation + Valvular Problems/Murmurs (s/p MVR 04/2023)  Rhythm:Regular Rate:Normal   1. Left ventricular ejection fraction, by estimation, is 55 to 60%. Left  ventricular ejection fraction by PLAX is 60 %. The left ventricle has  normal function. The left ventricle has no regional wall motion  abnormalities. Left ventricular diastolic  function could not be evaluated.   2. Right ventricular systolic function is moderately reduced. The right  ventricular size is normal. There is mildly elevated pulmonary artery  systolic pressure. The estimated right ventricular systolic pressure is  39.6 mmHg.   3. The mitral valve has been repaired/replaced. Trivial mitral valve  regurgitation. The mean mitral valve gradient is 6.3 mmHg. There is a 29  mm Mosaic bioprosthetic valve present in the mitral position. Procedure  Date: 05/11/2023. Echo findings are  consistent with normal structure and function of the mitral valve  prosthesis.   4. A small pericardial effusion is present. The pericardial effusion is  posterior to the left ventricle.   5. The aortic valve was not well visualized. Aortic valve regurgitation  is not visualized. Aortic valve  sclerosis/calcification is present,  without any evidence of aortic stenosis. Aortic valve mean gradient  measures 7.0 mmHg.   6. The inferior vena cava is normal in size with <50% respiratory  variability, suggesting right atrial pressure of 8 mmHg.     Neuro/Psych  Headaches PSYCHIATRIC DISORDERS Anxiety Depression    CVA, No Residual Symptoms    GI/Hepatic Neg liver ROS,GERD  Medicated and Controlled,,  Endo/Other  diabetes, Well Controlled, Type 2, Oral Hypoglycemic Agents  Obesity BMI 35  Renal/GU Renal InsufficiencyRenal diseaseCr 1.23  negative genitourinary   Musculoskeletal  (+) Arthritis , Osteoarthritis,    Abdominal   Peds  Hematology  (+) Blood dyscrasia, anemia Hb 9.1   Anesthesia Other Findings   Reproductive/Obstetrics negative OB ROS                             Anesthesia Physical Anesthesia Plan  ASA: 3  Anesthesia Plan: General   Post-op Pain Management:    Induction: Intravenous  PONV Risk Score and Plan: 2 and Propofol infusion, TIVA and Treatment may vary due to age or medical condition  Airway Management Planned: Simple Face Mask and Natural Airway  Additional Equipment:   Intra-op Plan:   Post-operative Plan:   Informed Consent: I have reviewed the patients History and Physical, chart, labs and discussed the procedure including the risks, benefits and alternatives for the proposed anesthesia with the patient or authorized representative who has indicated his/her understanding and acceptance.     Dental advisory given  Plan Discussed with: CRNA  Anesthesia Plan Comments:         Anesthesia Quick Evaluation

## 2023-07-02 NOTE — Progress Notes (Addendum)
PROGRESS NOTE    Rose [name]  ZOX:[id number] DOB: 15-Sep-1955 DOA: 06/30/2023 PCP: Cristino Martes, NP  Chronically ill 67/F with COPD, ongoing tobacco abuse, CAD, recent mitral valve replacement bioprosthetic valve 12/24, type 2 diabetes mellitus, history of pancreatitis, urogenital implant, OSA, CVA presented to the ED with chest pain and shortness of breath.  Reports being recently diagnosed with pneumonia on 1/23, completed antibiotics.  Patient reports ongoing chest pain since open heart surgery over a month ago, but worsening dyspnea recently, after surgery 12/13 developed transient A-fib, was discharged on Coumadin In the ED VSS, BNP 292, troponin 11, 12, INR 1.3, hemoglobin 9.3, creatinine 1.0, flu and RSV PCR negative, CTA chest with no PE and no acute pulmonary findings, noted to be in atrial flutter  Subjective: -Breathing better today, was made n.p.o. overnight however did have a piece of cracker and some coke  Assessment and Plan:  Acute on chronic diastolic CHF (congestive heart failure) (HCC) -Last echo 12/24 before valve replacement with EF 60%, indeterminate diastolic function, RV low normal, severe mitral regurgitation and stenosis -Repeat echo with preserved EF, moderately reduced RV, normal function of prosthetic mitral valve -Volume status has improved, change to oral Lasix, cut down midodrine  Recent MVR -Echo reviewed  Ongoing chest pain -For over a month, likely related to recent MVR, CTA chest without acute findings, supportive care   Atrial flutter w RVR -Currently on diltiazem, cardiology following, plan for cardioversion -INR subtherapeutic on warfarin, continue IV heparin, warfarin goal 2-3, she has a bioprosthetic valve   Tobacco abuse  - Counseled    Type 2 diabetes mellitus without complications (HCC)  - CBGs suboptimal, A1c is 6.3    COPD/chronic respiratory failure on 2 L home O2 -Clinically do not suspect exacerbation -Continue nebs, discontinued  steroids  DVT prophylaxis:  Heparin drip Code Status: Full code Family Communication: None present Disposition Plan: Home likely 48 hours  Consultants:    Procedures:   Antimicrobials:    Objective: Vitals:   07/02/23 0715 07/02/23 0730 07/02/23 0745 07/02/23 0900  BP: (!) 118/56 (!) 116/58 (!) 108/54 (!) 103/48  Pulse: 79 77 78 78  Resp: (!) 23 20 16 20   Temp:   (!) 97.5 F (36.4 C)   TempSrc:   Oral   SpO2: 94% 95% 98% 98%  Weight:      Height:        Intake/Output Summary (Last 24 hours) at 07/02/2023 0953 Last data filed at 07/02/2023 0754 Gross per 24 hour  Intake 674.47 ml  Output 3000 ml  Net -2325.53 ml   Filed Weights   06/30/23 1621  Weight: 89.4 kg    Examination:  General exam: Chronically ill female sitting up in bed, AAOx3 HEENT: No JVD CVS: S1-S2, irregular rhythm Lungs: Clear bilaterally, decreased at the bases Abdomen: Soft, nontender, bowel sounds present Extremities: Trace edema Skin: No rashes Psychiatry:  Mood & affect appropriate.     Data Reviewed:   CBC: Recent Labs  Lab 06/30/23 1725 07/01/23 0501 07/02/23 0452  WBC 6.9 3.9* 7.6  HGB 9.3* 9.2* 9.1*  HCT 29.7* 29.3* 28.5*  MCV 80.7 79.8* 79.2*  PLT 224 233 259   Basic Metabolic Panel: Recent Labs  Lab 06/30/23 1725 06/30/23 2040 07/01/23 0501 07/02/23 0452  NA 139  --  137 138  K 3.9  --  3.9 3.7  CL 103  --  100 99  CO2 24  --  25 28  GLUCOSE 125*  --  208* 172*  BUN 8  --  11 23  CREATININE 1.01*  --  0.99 1.23*  CALCIUM 8.9  --  8.7* 8.7*  MG  --  1.9 2.1  --   PHOS  --  3.8 3.8  --    GFR: Estimated Creatinine Clearance: 47.7 mL/min (A) (by C-G formula based on SCr of 1.23 mg/dL (H)). Liver Function Tests: Recent Labs  Lab 06/30/23 1725 06/30/23 2040 07/01/23 0501  AST 16 16 16   ALT 13 12 12   ALKPHOS 164* 164* 161*  BILITOT 0.4 0.3 0.2  PROT 6.5 6.6 6.6  ALBUMIN 3.0* 3.0* 2.9*   Recent Labs  Lab 06/30/23 2040  LIPASE 27   No results for  input(s): "AMMONIA" in the last 168 hours. Coagulation Profile: Recent Labs  Lab 06/30/23 1725 07/01/23 0501 07/02/23 0500  INR 1.3* 1.3* 1.4*   Cardiac Enzymes: Recent Labs  Lab 06/30/23 2040  CKTOTAL 37*   BNP (last 3 results) No results for input(s): "PROBNP" in the last 8760 hours. HbA1C: Recent Labs    06/30/23 2040  HGBA1C 6.3*   CBG: Recent Labs  Lab 07/01/23 1809 07/01/23 2015 07/02/23 0009 07/02/23 0443 07/02/23 0751  GLUCAP 223* 247* 257* 175* 159*   Lipid Profile: No results for input(s): "CHOL", "HDL", "LDLCALC", "TRIG", "CHOLHDL", "LDLDIRECT" in the last 72 hours. Thyroid Function Tests: Recent Labs    06/30/23 2040  TSH 1.510   Anemia Panel: Recent Labs    06/30/23 2040 07/01/23 0501  VITAMINB12  --  226  FOLATE  --  17.0  FERRITIN 17  --   TIBC 455*  --   IRON 29  --   RETICCTPCT 2.0  --    Urine analysis:    Component Value Date/Time   COLORURINE YELLOW 06/30/2023 2129   APPEARANCEUR CLEAR 06/30/2023 2129   LABSPEC 1.015 06/30/2023 2129   PHURINE 5.0 06/30/2023 2129   GLUCOSEU NEGATIVE 06/30/2023 2129   HGBUR NEGATIVE 06/30/2023 2129   BILIRUBINUR NEGATIVE 06/30/2023 2129   KETONESUR NEGATIVE 06/30/2023 2129   PROTEINUR NEGATIVE 06/30/2023 2129   UROBILINOGEN 0.2 02/03/2009 1447   NITRITE NEGATIVE 06/30/2023 2129   LEUKOCYTESUR NEGATIVE 06/30/2023 2129   Sepsis Labs: @LABRCNTIP (procalcitonin:4,lacticidven:4)  ) Recent Results (from the past 240 hours)  Resp panel by RT-PCR (RSV, Flu A&B, Covid) Anterior Nasal Swab     Status: None   Collection Time: 06/30/23  4:33 PM   Specimen: Anterior Nasal Swab  Result Value Ref Range Status   SARS Coronavirus 2 by RT PCR NEGATIVE NEGATIVE Final   Influenza A by PCR NEGATIVE NEGATIVE Final   Influenza B by PCR NEGATIVE NEGATIVE Final    Comment: (NOTE) The Xpert Xpress SARS-CoV-2/FLU/RSV plus assay is intended as an aid in the diagnosis of influenza from Nasopharyngeal swab  specimens and should not be used as a sole basis for treatment. Nasal washings and aspirates are unacceptable for Xpert Xpress SARS-CoV-2/FLU/RSV testing.  Fact Sheet for Patients: BloggerCourse.com  Fact Sheet for Healthcare Providers: SeriousBroker.it  This test is not yet approved or cleared by the [place] FDA and has been authorized for detection and/or diagnosis of SARS-CoV-2 by FDA under an Emergency Use Authorization (EUA). This EUA will remain in effect (meaning this test can be used) for the duration of the COVID-19 declaration under Section 564(b)(1) of the Act, 21 U.S.C. section 360bbb-3(b)(1), unless the authorization is terminated or revoked.     Resp Syncytial Virus by PCR NEGATIVE NEGATIVE Final  Comment: (NOTE) Fact Sheet for Patients: BloggerCourse.com  Fact Sheet for Healthcare Providers: SeriousBroker.it  This test is not yet approved or cleared by the [place] FDA and has been authorized for detection and/or diagnosis of SARS-CoV-2 by FDA under an Emergency Use Authorization (EUA). This EUA will remain in effect (meaning this test can be used) for the duration of the COVID-19 declaration under Section 564(b)(1) of the Act, 21 U.S.C. section 360bbb-3(b)(1), unless the authorization is terminated or revoked.  Performed at [facility], [address], [place], [place] [address]   MRSA Next Gen by PCR, Nasal     Status: None   Collection Time: 06/30/23 11:37 PM   Specimen: Nasal Mucosa; Nasal Swab  Result Value Ref Range Status   MRSA by PCR Next Gen NOT DETECTED NOT DETECTED Final    Comment: (NOTE) The GeneXpert MRSA Assay (FDA approved for NASAL specimens only), is one component of a comprehensive MRSA colonization surveillance program. It is not intended to diagnose MRSA infection nor to guide or monitor treatment for MRSA  infections. Test performance is not FDA approved in patients less than 28 years old. Performed at [facility], [address] [place], [place] [address]      Radiology Studies: ECHOCARDIOGRAM COMPLETE Result Date: 07/01/2023    ECHOCARDIOGRAM REPORT   Patient Name:   Rose [name] Date of Exam: 07/01/2023 Medical Rec #:  [id number]      Height:       63.0 in Accession #:    [id number]     Weight:       197.0 lb Date of Birth:  02/12/1956      BSA:          1.921 m Patient Age:    67 years       BP:           122/57 mmHg Patient Gender: F              HR:           72 bpm. Exam Location:  Inpatient Procedure: 2D Echo, Cardiac Doppler and Color Doppler Indications:    Arrhythmia  History:        Patient has prior history of Echocardiogram examinations, most                 recent 05/09/2023. Stroke and COPD, Mitral Valve Disease; Risk                 Factors:Sleep Apnea, Hypertension and Diabetes.                  Mitral Valve: 29 mm Mosaic bioprosthetic valve valve is present                 in the mitral position. Procedure Date: 05/11/2023.  Sonographer:    Darlys Gales Referring Phys: [id number] ANASTASSIA DOUTOVA IMPRESSIONS  1. Left ventricular ejection fraction, by estimation, is 55 to 60%. Left ventricular ejection fraction by PLAX is 60 %. The left ventricle has normal function. The left ventricle has no regional wall motion abnormalities. Left ventricular diastolic function could not be evaluated.  2. Right ventricular systolic function is moderately reduced. The right ventricular size is normal. There is mildly elevated pulmonary artery systolic pressure. The estimated right ventricular systolic pressure is 39.6 mmHg.  3. The mitral valve has been repaired/replaced. Trivial mitral valve regurgitation. The mean mitral valve gradient is 6.3 mmHg. There is a 29 mm Mosaic  bioprosthetic valve present in the mitral position. Procedure Date: 05/11/2023. Echo findings are consistent with normal structure  and function of the mitral valve prosthesis.  4. A small pericardial effusion is present. The pericardial effusion is posterior to the left ventricle.  5. The aortic valve was not well visualized. Aortic valve regurgitation is not visualized. Aortic valve sclerosis/calcification is present, without any evidence of aortic stenosis. Aortic valve mean gradient measures 7.0 mmHg.  6. The inferior vena cava is normal in size with <50% respiratory variability, suggesting right atrial pressure of 8 mmHg. Comparison(s): Changes from prior study are noted. 05/09/2023: LVEF 60-65%, moderate to severe MR - trivial pericardial effusion. Compared to this study, the mitral valve has been replaced with a bioprothesis. FINDINGS  Left Ventricle: Left ventricular ejection fraction, by estimation, is 55 to 60%. Left ventricular ejection fraction by PLAX is 60 %. The left ventricle has normal function. The left ventricle has no regional wall motion abnormalities. The left ventricular internal cavity size was normal in size. There is no left ventricular hypertrophy. Left ventricular diastolic function could not be evaluated due to mitral valve replacement. Left ventricular diastolic function could not be evaluated. Right Ventricle: The right ventricular size is normal. No increase in right ventricular wall thickness. Right ventricular systolic function is moderately reduced. There is mildly elevated pulmonary artery systolic pressure. The tricuspid regurgitant velocity is 2.81 m/s, and with an assumed right atrial pressure of 8 mmHg, the estimated right ventricular systolic pressure is 39.6 mmHg. Left Atrium: Left atrial size was normal in size. Right Atrium: Right atrial size was normal in size. Pericardium: A small pericardial effusion is present. The pericardial effusion is posterior to the left ventricle. Mitral Valve: The mitral valve has been repaired/replaced. Trivial mitral valve regurgitation. There is a 29 mm Mosaic  bioprosthetic valve present in the mitral position. Procedure Date: 05/11/2023. Echo findings are consistent with normal structure and function of the mitral valve prosthesis. MV peak gradient, 15.7 mmHg. The mean mitral valve gradient is 6.3 mmHg. Tricuspid Valve: The tricuspid valve is grossly normal. Tricuspid valve regurgitation is mild. Aortic Valve: The aortic valve was not well visualized. Aortic valve regurgitation is not visualized. Aortic valve sclerosis/calcification is present, without any evidence of aortic stenosis. Aortic valve mean gradient measures 7.0 mmHg. Aortic valve peak gradient measures 12.4 mmHg. Aortic valve area, by VTI measures 1.70 cm. Pulmonic Valve: The pulmonic valve was normal in structure. Pulmonic valve regurgitation is not visualized. Aorta: The aortic root and ascending aorta are structurally normal, with no evidence of dilitation. Venous: The inferior vena cava is normal in size with less than 50% respiratory variability, suggesting right atrial pressure of 8 mmHg. IAS/Shunts: No atrial level shunt detected by color flow Doppler.  LEFT VENTRICLE PLAX 2D LV EF:         Left            Diastology                ventricular     LV e' medial:    5.55 cm/s                ejection        LV E/e' medial:  29.0                fraction by     LV e' lateral:   6.74 cm/s                PLAX is  60      LV E/e' lateral: 23.9                %. LVIDd:         4.40 cm LVIDs:         3.00 cm LV PW:         0.80 cm LV IVS:        0.70 cm LVOT diam:     1.80 cm LV SV:         72 LV SV Index:   38 LVOT Area:     2.54 cm  RIGHT VENTRICLE RV S prime:     7.83 cm/s LEFT ATRIUM             Index        RIGHT ATRIUM           Index LA Vol (A2C):   51.4 ml 26.75 ml/m  RA Area:     21.00 cm LA Vol (A4C):   50.8 ml 26.44 ml/m  RA Volume:   57.30 ml  29.83 ml/m LA Biplane Vol: 51.9 ml 27.01 ml/m  AORTIC VALVE AV Area (Vmax):    1.82 cm AV Area (Vmean):   1.87 cm AV Area (VTI):     1.70 cm AV Vmax:            176.00 cm/s AV Vmean:          131.000 cm/s AV VTI:            0.426 m AV Peak Grad:      12.4 mmHg AV Mean Grad:      7.0 mmHg LVOT Vmax:         126.00 cm/s LVOT Vmean:        96.200 cm/s LVOT VTI:          0.284 m LVOT/AV VTI ratio: 0.67 MITRAL VALVE                TRICUSPID VALVE MV Area (PHT): 3.68 cm     TR Peak grad:   31.6 mmHg MV Area VTI:   1.65 cm     TR Vmax:        281.00 cm/s MV Peak grad:  15.7 mmHg MV Mean grad:  6.3 mmHg     SHUNTS MV Vmax:       1.98 m/s     Systemic VTI:  0.28 m MV Vmean:      120.0 cm/s   Systemic Diam: 1.80 cm MV Decel Time: 206 msec MV E velocity: 161.00 cm/s MV A velocity: 107.00 cm/s MV E/A ratio:  1.50 Zoila Shutter MD Electronically signed by Zoila Shutter MD Signature Date/Time: 07/01/2023/1:07:43 PM    Final    CT Angio Chest Pulmonary Embolism (PE) W or WO Contrast Result Date: 06/30/2023 CLINICAL DATA:  Chest pain, dyspnea EXAM: CT ANGIOGRAPHY CHEST WITH CONTRAST TECHNIQUE: Multidetector CT imaging of the chest was performed using the standard protocol during bolus administration of intravenous contrast. Multiplanar CT image reconstructions and MIPs were obtained to evaluate the vascular anatomy. RADIATION DOSE REDUCTION: This exam was performed according to the departmental dose-optimization program which includes automated exposure control, adjustment of the mA and/or kV according to patient size and/or use of iterative reconstruction technique. CONTRAST:  75mL OMNIPAQUE IOHEXOL 350 MG/ML SOLN COMPARISON:  06/06/2023 FINDINGS: Cardiovascular: There is adequate opacification of the pulmonary arterial tree. No intraluminal filling defect identified to suggest acute pulmonary embolism. The central pulmonary arteries  are caliber. Mitral valve replacement has been performed. Mild global cardiomegaly is present, stable since prior examination. Small pericardial effusion is stable since prior examination. Mild atherosclerotic calcifications again seen within the  thoracic aorta. No aortic aneurysm. Mediastinum/Nodes: Shotty mediastinal is present, stable since prior examination and nonspecific, possibly reactive in nature. There is, similarly, infiltration within the anterior mediastinum which appears similar prior examination and is in keeping with recent surgical intervention the esophagus is unremarkable. Visualized thyroid is unremarkable. Lungs/Pleura: Stable band like atelectasis or scarring within the right lower lobe. Mild emphysema. Motion artifact slightly limits evaluation pulmonary parenchyma. No superimposed focal pulmonary infiltrate. No pneumothorax or pleural effusion. Upper Abdomen: A punctate calcifications are noted throughout the visualized pancreatic parenchyma keeping with changes of pancreatitis. No superimposed acute peripancreatic inflammatory changes identified within visualized abdomen. Status post cholecystectomy. No acute abnormality. Musculoskeletal: Ununited median sternotomy again noted in keeping recent intervention. No acute bone abnormality. No lytic or blastic lesion. Review of the MIP images confirms the above findings. IMPRESSION: 1. No pulmonary embolism. No acute intrathoracic pathology identified. 2. Status post mitral valve replacement. Stable mild global cardiomegaly. Stable small pericardial effusion. 3. Mild emphysema 4. Stable changes of chronic pancreatitis. Aortic Atherosclerosis (ICD10-I70.0) and Emphysema (ICD10-J43.9). Electronically Signed   By: Helyn Numbers M.D.   On: 06/30/2023 23:33   DG Chest Portable 1 View Result Date: 06/30/2023 CLINICAL DATA:  Chest pain and cough. Diagnosed with pneumonia in the twenty-third. Finished antibiotics but chest pain increasing. Atrial fibrillation. EXAM: PORTABLE CHEST 1 VIEW COMPARISON:  06/21/2023 FINDINGS: Cardiac enlargement with mild central vascular congestion. No focal consolidation or airspace disease. Left lung infiltrates seen previously are resolved. No pleural effusion.  No pneumothorax. Mediastinal contours appear intact. IMPRESSION: Cardiac enlargement with mild vascular congestion. Electronically Signed   By: Burman Nieves M.D.   On: 06/30/2023 17:07     Scheduled Meds:  clonazePAM  1 mg Oral TID   gabapentin  400 mg Oral TID   insulin aspart  0-9 Units Subcutaneous Q4H   ipratropium-albuterol  3 mL Nebulization Q6H   midodrine  10 mg Oral TID WC   pantoprazole  40 mg Oral Daily   rOPINIRole  1 mg Oral QHS   rosuvastatin  40 mg Oral QHS   sertraline  50 mg Oral q morning   traZODone  150 mg Oral QHS   warfarin  5 mg Oral ONCE-1600   Warfarin - Pharmacist Dosing Inpatient   Does not apply q1600   Continuous Infusions:  diltiazem (CARDIZEM) infusion 7.5 mg/hr (07/02/23 0754)   heparin 1,350 Units/hr (07/02/23 0754)     LOS: 1 day    Time spent:    Zannie Cove, MD [facility]   07/02/2023, 9:53 AM

## 2023-07-02 NOTE — CV Procedure (Signed)
   TRANSESOPHAGEAL ECHOCARDIOGRAM GUIDED DIRECT CURRENT CARDIOVERSION  NAME:  Rose [name]    MRN: [id number] DOB:  Mar 24, 1956    ADMIT DATE: 06/30/2023  INDICATIONS: Symptomatic atrial flutter  PROCEDURE:   Informed consent was obtained prior to the procedure. The risks, benefits and alternatives for the procedure were discussed and the patient comprehended these risks.  Risks include, but are not limited to, cough, sore throat, vomiting, nausea, somnolence, esophageal and stomach trauma or perforation, bleeding, low blood pressure, aspiration, pneumonia, infection, trauma to the teeth and death.    After a procedural time-out, the oropharynx was anesthetized and the patient was sedated by the anesthesia service. The transesophageal probe was inserted in the esophagus and stomach without difficulty and multiple views were obtained. Anesthesia was monitored by Dr. Desmond Lope.   COMPLICATIONS:    Complications: No complications Patient tolerated procedure well.  KEY FINDINGS:  No LAA thrombus.  Normal Bioprosthetic MV function.  Normal LV/RV function.  Full Report to follow.   CARDIOVERSION:     Indications:  Symptomatic Atrial Flutter  Procedure Details:  Once the TEE was complete, the patient had the defibrillator pads placed in the anterior and posterior position. Once an appropriate level of sedation was confirmed, the patient was cardioverted x 1 with 200J of biphasic synchronized energy.  The patient converted to NSR.  There were no apparent complications.  The patient had normal neuro status and respiratory status post procedure with vitals stable as recorded elsewhere.  Adequate airway was maintained throughout and vital signs monitored per protocol.  Gerri Spore T. Flora Lipps, MD [facility]  [address] [place], [place] [address] [phone]  2:39 PM

## 2023-07-02 NOTE — Progress Notes (Addendum)
Patient Name: Rose [name] Date of Encounter: 07/02/2023 [facility] Cardiologist: Donato Schultz, MD   Interval Summary  .    Patient continues with chest pain on coughing, vague sensation of palpitations while in atrial flutter. She is frustrated about still being in the ED.   Vital Signs .    Vitals:   07/02/23 0900 07/02/23 0930 07/02/23 1000 07/02/23 1030  BP: (!) 103/48 (!) 118/59 120/62 127/61  Pulse: 78 77 77 78  Resp: 20 16 (!) 21 18  Temp:      TempSrc:      SpO2: 98% 98% 98% 96%  Weight:      Height:        Intake/Output Summary (Last 24 hours) at 07/02/2023 1110 Last data filed at 07/02/2023 0754 Gross per 24 hour  Intake 674.47 ml  Output 3000 ml  Net -2325.53 ml      06/30/2023    4:21 PM 06/21/2023    6:32 PM 05/31/2023    1:48 PM  Last 3 Weights  Weight (lbs) 196 lb 15.7 oz 196 lb 15.7 oz 197 lb  Weight (kg) 89.35 kg 89.35 kg 89.359 kg      Telemetry/ECG    Rate controlled atrial flutter, 4:1 conduction - Personally Reviewed  Physical Exam .   GEN: No acute distress.   Neck: No JVD Cardiac: RRR, no murmurs, rubs, or gallops.  Respiratory: bilateral upper lobe inspiratory wheezing GI: Soft, nontender, non-distended  MS: No edema  Assessment & Plan .     Rose [name] is a 68 y.o. 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   Atrial flutter  Patient presented to the ED with left side chest pain, shortness of breath, heart fluttering sensation 06/30/23. (Of note, she is s/p MVR with Dr. Leafy Ro on 05/11/23. Post procedure noted with afib and RVR but converted to NSR with amiodarone bolus. She was started  on Coumadin. Patient presented back to the ED on 06/21/23 with left side chest pain, found in rate controlled atrial flutter. No changes made to medications). This admission, patient now with improved rate, persistent atrial flutter on Diltiazem.   Continue IV diltiazem today, pending TEE/DCCV (delayed by patient eating this morning. Scheduled for ~2:30pm).  Consider loading oral amiodarone s/p DCCV.  Continue Warfarin  Acute on chronic diastolic CHF  TTE this admission shows stable/preserved LVEF. Moderately reduced RV function, appropriate functioning of prosthetic mitral valve.   Patient appears euvolemic on exam today. No rales noted on pulmonary exam, upper lobe wheezing only.  Suspect some of her volume issues secondary to arrhythmia. Carefully monitor volume status s/p DCCV to determine need/dose of Lasix.  Consider adding SGLT2/MRA   Atypical chest pain CAD  Patient with left sided chest pain, worse with cough. Symptoms consistent with post-surgical pain, low suspicion for ischemic etiology. Mild disease on [facility] June 2024.   Continue statin, warfarin.   Per primary team: DM type II Tobacco use COPD  For questions or updates, please contact [facility] Please consult [email or web address] for contact info under        Signed, Perlie Gold, PA-C   Personally seen and examined. Agree with above.  68 year old here with atrial flutter 4-1  conduction personally reviewed and interpreted on telemetry with recent mitral valve replacement 29 mm bioprosthetic valve.  Social determinants of health have been an issue.  She also has COPD OSA not on CPAP  She is currently on warfarin and IV heparin.  -IV diltiazem -Plan on TEE cardioversion at 230 today.  Risk and benefits of been discussed and she is willing to proceed.  Understands the importance of not eating prior to procedure. -Appears euvolemic  Donato Schultz, MD

## 2023-07-02 NOTE — Transfer of Care (Signed)
Immediate Anesthesia Transfer of Care Note  Patient: Rose [name]  Procedure(s) Performed: TRANSESOPHAGEAL ECHOCARDIOGRAM CARDIOVERSION  Patient Location: PACU and Cath Lab  Anesthesia Type:General  Level of Consciousness: drowsy  Airway & Oxygen Therapy: Patient Spontanous Breathing and Patient connected to nasal cannula oxygen  Post-op Assessment: Report given to RN and Post -op Vital signs reviewed and stable  Post vital signs: Reviewed and stable  Last Vitals:  Vitals Value Taken Time  BP 114/61 07/02/23 1445  Temp 36.5 C 07/02/23 1445  Pulse 78 07/02/23 1449  Resp 15 07/02/23 1449  SpO2 95 % 07/02/23 1449  Vitals shown include unfiled device data.  Last Pain:  Vitals:   07/02/23 1445  TempSrc: Temporal  PainSc: 0-No pain         Complications: No notable events documented.

## 2023-07-02 NOTE — Anesthesia Postprocedure Evaluation (Signed)
Anesthesia Post Note  Patient: Rose [name]  Procedure(s) Performed: TRANSESOPHAGEAL ECHOCARDIOGRAM CARDIOVERSION     Patient location during evaluation: Endoscopy Anesthesia Type: General Level of consciousness: oriented, awake and alert and awake Pain management: pain level controlled Vital Signs Assessment: post-procedure vital signs reviewed and stable Respiratory status: spontaneous breathing, nonlabored ventilation and respiratory function stable Cardiovascular status: blood pressure returned to baseline and stable Postop Assessment: no headache, no backache and no apparent nausea or vomiting Anesthetic complications: no   No notable events documented.  Last Vitals:  Vitals:   07/02/23 1530 07/02/23 1600  BP: 124/67 (!) 107/56  Pulse: 74 94  Resp: 20 (!) 26  Temp:    SpO2: 98% 91%    Last Pain:  Vitals:   07/02/23 1510  TempSrc:   PainSc: 0-No pain                 Collene Schlichter

## 2023-07-02 NOTE — ED Notes (Signed)
Pt requesting this RN to assist her in giving a bedside bath prior to going to her procedure. RN at bedside assisting.

## 2023-07-02 NOTE — Interval H&P Note (Signed)
History and Physical Interval Note:  07/02/2023 2:03 PM  Rose [name]  has presented today for surgery, with the diagnosis of Afib.  The various methods of treatment have been discussed with the patient and family. After consideration of risks, benefits and other options for treatment, the patient has consented to  Procedure(s): TRANSESOPHAGEAL ECHOCARDIOGRAM (N/A) CARDIOVERSION (N/A) as a surgical intervention.  The patient's history has been reviewed, patient examined, no change in status, stable for surgery.  I have reviewed the patient's chart and labs.  Questions were answered to the patient's satisfaction.    NPO for TEE/DCCV. On heparin drip with plan to bridge to coumadin.   Gerri Spore T. Flora Lipps, MD, [facility]  [facility]  [address], [address] [place], [place] [address] [phone]  2:04 PM

## 2023-07-03 ENCOUNTER — Other Ambulatory Visit (HOSPITAL_COMMUNITY): Payer: Self-pay

## 2023-07-03 ENCOUNTER — Telehealth: Payer: Self-pay

## 2023-07-03 ENCOUNTER — Other Ambulatory Visit: Payer: Self-pay | Admitting: Cardiology

## 2023-07-03 ENCOUNTER — Telehealth (HOSPITAL_COMMUNITY): Payer: Self-pay | Admitting: Pharmacy Technician

## 2023-07-03 ENCOUNTER — Encounter (HOSPITAL_COMMUNITY): Payer: Self-pay | Admitting: Cardiovascular Disease

## 2023-07-03 DIAGNOSIS — I5033 Acute on chronic diastolic (congestive) heart failure: Secondary | ICD-10-CM | POA: Diagnosis not present

## 2023-07-03 DIAGNOSIS — I4892 Unspecified atrial flutter: Secondary | ICD-10-CM | POA: Diagnosis not present

## 2023-07-03 LAB — BASIC METABOLIC PANEL
Anion gap: 11 (ref 5–15)
BUN: 26 mg/dL — ABNORMAL HIGH (ref 8–23)
CO2: 28 mmol/L (ref 22–32)
Calcium: 8.3 mg/dL — ABNORMAL LOW (ref 8.9–10.3)
Chloride: 100 mmol/L (ref 98–111)
Creatinine, Ser: 1.56 mg/dL — ABNORMAL HIGH (ref 0.44–1.00)
GFR, Estimated: 36 mL/min — ABNORMAL LOW (ref >60)
Glucose, Bld: 119 mg/dL — ABNORMAL HIGH (ref 70–99)
Potassium: 3.2 mmol/L — ABNORMAL LOW (ref 3.5–5.1)
Sodium: 139 mmol/L (ref 135–145)

## 2023-07-03 LAB — PROTIME-INR
INR: 1.6 — ABNORMAL HIGH (ref 0.8–1.2)
Prothrombin Time: 19.4 s — ABNORMAL HIGH (ref 11.4–15.2)

## 2023-07-03 LAB — CBC
HCT: 27.2 % — ABNORMAL LOW (ref 36.0–46.0)
Hemoglobin: 8.7 g/dL — ABNORMAL LOW (ref 12.0–15.0)
MCH: 25.3 pg — ABNORMAL LOW (ref 26.0–34.0)
MCHC: 32 g/dL (ref 30.0–36.0)
MCV: 79.1 fL — ABNORMAL LOW (ref 80.0–100.0)
Platelets: 184 10*3/uL (ref 150–400)
RBC: 3.44 MIL/uL — ABNORMAL LOW (ref 3.87–5.11)
RDW: 15.8 % — ABNORMAL HIGH (ref 11.5–15.5)
WBC: 4.3 10*3/uL (ref 4.0–10.5)
nRBC: 0 % (ref 0.0–0.2)

## 2023-07-03 LAB — HEPARIN LEVEL (UNFRACTIONATED): Heparin Unfractionated: 0.72 [IU]/mL — ABNORMAL HIGH (ref 0.30–0.70)

## 2023-07-03 LAB — GLUCOSE, CAPILLARY
Glucose-Capillary: 121 mg/dL — ABNORMAL HIGH (ref 70–99)
Glucose-Capillary: 118 mg/dL — ABNORMAL HIGH (ref 70–99)
Glucose-Capillary: 153 mg/dL — ABNORMAL HIGH (ref 70–99)

## 2023-07-03 MED ORDER — WARFARIN SODIUM 2.5 MG PO TABS: 5.0000 mg | ORAL_TABLET | Freq: Every day | ORAL | Status: DC

## 2023-07-03 MED ORDER — FUROSEMIDE 20 MG PO TABS
20.0000 mg | ORAL_TABLET | ORAL | 1 refills | Status: DC | PRN
  Filled 2023-07-03: qty 30, 30d supply, fill #0

## 2023-07-03 MED ORDER — ENOXAPARIN SODIUM 80 MG/0.8ML IJ SOSY
80.0000 mg | PREFILLED_SYRINGE | Freq: Once | INTRAMUSCULAR | Status: AC
  Administered 2023-07-03: 80 mg via SUBCUTANEOUS
  Filled 2023-07-03: qty 0.8

## 2023-07-03 MED ORDER — AMIODARONE HCL 200 MG PO TABS
200.0000 mg | ORAL_TABLET | Freq: Two times a day (BID) | ORAL | 1 refills | Status: DC
  Filled 2023-07-03: qty 60, 46d supply, fill #0

## 2023-07-03 MED ORDER — AMIODARONE HCL 200 MG PO TABS
400.0000 mg | ORAL_TABLET | Freq: Two times a day (BID) | ORAL | Status: DC
  Administered 2023-07-03: 400 mg via ORAL
  Filled 2023-07-03: qty 2

## 2023-07-03 MED ORDER — POTASSIUM CHLORIDE CRYS ER 20 MEQ PO TBCR
40.0000 meq | EXTENDED_RELEASE_TABLET | Freq: Once | ORAL | Status: AC
  Administered 2023-07-03: 40 meq via ORAL
  Filled 2023-07-03: qty 2

## 2023-07-03 MED ORDER — MIDODRINE HCL 10 MG PO TABS: 5.0000 mg | ORAL_TABLET | Freq: Two times a day (BID) | ORAL | Status: DC

## 2023-07-03 MED ORDER — GABAPENTIN 800 MG PO TABS: 400.0000 mg | ORAL_TABLET | Freq: Three times a day (TID) | ORAL | Status: AC

## 2023-07-03 MED ORDER — ENOXAPARIN (LOVENOX) PATIENT EDUCATION KIT
PACK | Freq: Once | Status: AC
  Filled 2023-07-03: qty 1

## 2023-07-03 MED ORDER — ENOXAPARIN SODIUM 80 MG/0.8ML IJ SOSY
80.0000 mg | PREFILLED_SYRINGE | Freq: Two times a day (BID) | INTRAMUSCULAR | 0 refills | Status: DC
  Filled 2023-07-03: qty 6.4, 4d supply, fill #0

## 2023-07-03 MED ORDER — ENOXAPARIN SODIUM 80 MG/0.8ML IJ SOSY: 80.0000 mg | PREFILLED_SYRINGE | Freq: Two times a day (BID) | INTRAMUSCULAR | Status: DC

## 2023-07-03 NOTE — Discharge Summary (Signed)
 Physician Discharge Summary  Rose [name] FMW:[id number] DOB: [birth year] DOA: 06/30/2023  PCP: Arloa Jarvis, NP  Admit date: 06/30/2023 Discharge date: 07/03/2023  Time spent:45 minutes  Recommendations for Outpatient Follow-up:  Continue Lovenox  for 3 days, INR check on Friday 2/7, discontinue Lovenox  when INR therapeutic, goal 2-3 for A-fib Cardiology Dr. Jeffrie 2 weeks,  T CTS follow-up post MVR BMP in 3 days   Discharge Diagnoses:  Principal Problem:   Acute on chronic diastolic CHF (congestive heart failure) (HCC) Active Problems:   Anemia of chronic disease   Tobacco abuse   Type 2 diabetes mellitus without complications (HCC)   S/P mitral valve replacement   Long term (current) use of anticoagulants   COPD with acute exacerbation (HCC)   Chronic respiratory failure with hypoxia (HCC)   Chest pain   Typical atrial flutter (HCC)   CHF (congestive heart failure) (HCC)   Discharge Condition: Improved  Diet recommendation: Diabetic, low-sodium, heart healthy  Filed Weights   06/30/23 1621 07/03/23 0500  Weight: 89.4 kg 85 kg    History of present illness:  Chronically ill 67/F with COPD, ongoing tobacco abuse, CAD, recent mitral valve replacement bioprosthetic valve 12/24, type 2 diabetes mellitus, history of pancreatitis, urogenital implant, OSA, CVA presented to the ED with chest pain and shortness of breath.  Reports being recently diagnosed with pneumonia on 1/23, completed antibiotics.  Patient reports ongoing chest pain since open heart surgery over a month ago, but worsening dyspnea recently, after surgery 12/13 developed transient A-fib, was discharged on Coumadin  In the ED VSS, BNP 292, troponin 11, 12, INR 1.3, hemoglobin 9.3, creatinine 1.0, flu and RSV PCR negative, CTA chest with no PE and no acute pulmonary findings, noted to be in atrial flutter  Hospital Course:  Acute on chronic diastolic CHF (congestive heart failure) (HCC) -Last echo 12/24 before valve  replacement with EF 60%, indeterminate diastolic function, RV low normal, severe mitral regurgitation and stenosis -Repeat echo with preserved EF, moderately reduced RV, normal function of bioprosthetic mitral valve -Volume status has improved, creatinine higher, appears euvolemic,likely related to atrial flutter, not on diuretics at baseline, required 2 doses of IV Lasix  this admission, then discontinued -Home midodrine  dose decreased -[facility] follow-up, add as needed Lasix , BMP in 3 days with PT/INR   Recent MVR -Echo reviewed   Ongoing chest pain -For over a month, likely related to recent MVR, CTA chest without acute findings, supportive care   Atrial flutter w RVR -Now off diltiazem , underwent TEE and cardioversion yesterday -Switched to oral amiodarone  today -INR subtherapeutic at 1.6, Lovenox  teaching, anticipate need for 2 to 3 days of subcu Lovenox    Tobacco abuse  - Counseled    Type 2 diabetes mellitus without complications (HCC)  - CBGs suboptimal, A1c is 6.3    COPD/chronic respiratory failure on 2 L home O2 -Clinically do not suspect exacerbation -Continue nebs, discontinued steroids    Discharge Exam: Vitals:   07/03/23 0745 07/03/23 0850  BP: (!) 110/58   Pulse: 86   Resp: 20   Temp: 97.6 F (36.4 C)   SpO2: 96% 92%   Gen: Awake, Alert, Oriented X 3,  HEENT: no JVD Lungs: Good air movement bilaterally, CTAB CVS: S1S2/RRR Abd: soft, Non tender, non distended, BS present Extremities: No edema Skin: no new rashes on exposed skin   Discharge Instructions   Discharge Instructions     Diet - low sodium heart healthy   Complete by: As directed  Increase activity slowly   Complete by: As directed       Allergies as of 07/03/2023       Reactions   Erythromycin Shortness Of Breath   Nickel Rash, Shortness Of Breath   Oxycodone  Shortness Of Breath   Meperidine Other (See Comments)   Confusion and tingling  (noted in H&P)   Nicotine  Hives,  Itching   Codeine Swelling   Hydrocodone -acetaminophen  Itching   Tolerated Norco 11/2018   Morphine Itching   Propoxyphene Other (See Comments)   Ineffective    Quetiapine    Restless leg syndrome        Medication List     STOP taking these medications    amoxicillin -clavulanate 875-125 MG tablet Commonly known as: AUGMENTIN        TAKE these medications    acetaminophen  500 MG tablet Commonly known as: TYLENOL  Take 500 mg by mouth every 6 (six) hours as needed for mild pain (pain score 1-3) or headache.   albuterol  108 (90 Base) MCG/ACT inhaler Commonly known as: VENTOLIN  HFA SMARTSIG:1 Puff(s) Via Inhaler Every 6 Hours PRN   amiodarone  200 MG tablet Commonly known as: PACERONE  Take 1 tablet (200 mg total) by mouth 2 (two) times daily. Take 200 Mg twice a day for 2 weeks then 200mg  daily   clonazePAM  1 MG tablet Commonly known as: KLONOPIN  Take 1 mg by mouth 3 (three) times daily.   clotrimazole 10 MG troche Commonly known as: MYCELEX SMARTSIG:1 Lozenge(s) By Mouth 5 Times Daily   Creon  24000-76000 units Cpep Generic drug: Pancrelipase  (Lip-Prot-Amyl) Take 2 capsules by mouth 3 (three) times daily after meals.   enoxaparin  80 MG/0.8ML injection Commonly known as: LOVENOX  Inject 0.8 mLs (80 mg total) into the skin every 12 (twelve) hours for 4 days.   gabapentin  800 MG tablet Commonly known as: NEURONTIN  Take 0.5 tablets (400 mg total) by mouth 3 (three) times daily. What changed: how much to take   metFORMIN  500 MG tablet Commonly known as: GLUCOPHAGE  Take 500 mg by mouth 2 (two) times daily.   midodrine  10 MG tablet Commonly known as: PROAMATINE  Take 0.5 tablets (5 mg total) by mouth 2 (two) times daily. What changed:  how much to take when to take this   omeprazole 40 MG capsule Commonly known as: PRILOSEC Take 40 mg by mouth daily.   ondansetron  4 MG tablet Commonly known as: ZOFRAN  Take 4 mg by mouth every 8 (eight) hours as needed for  nausea or vomiting.   OXYGEN Inhale 2 L into the lungs continuous.   rOPINIRole  1 MG tablet Commonly known as: REQUIP  Take 1 mg by mouth at bedtime.   rosuvastatin  40 MG tablet Commonly known as: CRESTOR  Take 1 tablet (40 mg total) by mouth at bedtime.   sertraline  50 MG tablet Commonly known as: ZOLOFT  Take 50 mg by mouth every morning.   traMADol  50 MG tablet Commonly known as: ULTRAM  Take 50-100 mg by mouth every 6 (six) hours as needed for moderate pain (pain score 4-6).   traZODone  50 MG tablet Commonly known as: DESYREL  Take 150 mg by mouth at bedtime.   Trelegy Ellipta 200-62.5-25 MCG/ACT Aepb Generic drug: Fluticasone -Umeclidin-Vilant Inhale 200 mcg into the lungs daily as needed.   warfarin 2.5 MG tablet Commonly known as: COUMADIN  Take as directed. If you are unsure how to take this medication, talk to your nurse or doctor. Original instructions: Take 2 tablets (5 mg total) by mouth daily. Take 5 mg daily(2 tabs)  till  INR check on Friday 2/7 What changed:  how much to take additional instructions       Allergies  Allergen Reactions   Erythromycin Shortness Of Breath   Nickel Rash and Shortness Of Breath   Oxycodone  Shortness Of Breath   Meperidine Other (See Comments)    Confusion and tingling  (noted in H&P)    Nicotine  Hives and Itching   Codeine Swelling   Hydrocodone -Acetaminophen  Itching    Tolerated Norco 11/2018   Morphine Itching   Propoxyphene Other (See Comments)    Ineffective    Quetiapine     Restless leg syndrome      The results of significant diagnostics from this hospitalization (including imaging, microbiology, ancillary and laboratory) are listed below for reference.    Significant Diagnostic Studies: ECHO TEE Result Date: 07/02/2023    TRANSESOPHOGEAL ECHO REPORT   Patient Name:   Rose [name] Date of Exam: 07/02/2023 Medical Rec #:  [id number]      Height:       63.0 in Accession #:    [id number]     Weight:       197.0 lb  Date of Birth:  February 03, 1956      BSA:          1.921 m Patient Age:    67 years       BP:           106/77 mmHg Patient Gender: F              HR:           75 bpm. Exam Location:  Inpatient Procedure: Transesophageal Echo, 3D Echo, Cardiac Doppler and Color Doppler Indications:     I48.92* Unspecified atrial flutter  History:         Patient has prior history of Echocardiogram examinations, most                  recent 07/01/2023. CHF, CAD, COPD; Risk Factors:Diabetes and                  Dyslipidemia.                   Mitral Valve: 29 mm bioprosthetic valve valve is present in the                  mitral position. Procedure Date: 05/11/2023.  Sonographer:     Damien Senior RDCS Referring Phys:  [id number] DARRYLE NED O'NEAL Diagnosing Phys: Darryle Decent MD PROCEDURE: After discussion of the risks and benefits of a TEE, an informed consent was obtained from the patient. TEE procedure time was 7 minutes. The transesophogeal probe was passed without difficulty through the esophogus of the patient. Sedation performed by different physician. The patient was monitored while under deep sedation. Anesthestetic sedation was provided intravenously by Anesthesiology: 264mg  of Propofol . Image quality was excellent. The patient's vital signs; including heart rate, blood pressure, and oxygen saturation; remained stable throughout the procedure. The patient developed no complications during the procedure. A successful direct current cardioversion was performed at 200 joules with 1 attempt.  IMPRESSIONS  1. Left ventricular ejection fraction, by estimation, is 50 to 55%. The left ventricle has low normal function.  2. Right ventricular systolic function is normal. The right ventricular size is normal.  3. Left atrial size was mild to moderately dilated. No left atrial/left atrial appendage thrombus was detected. The LAA emptying velocity was 63 cm/s.  4. Normal bioprothestic  MV. Normal function and no regurgitation. Assessed by 3D  echo. The mitral valve has been repaired/replaced. No evidence of mitral valve regurgitation. The mean mitral valve gradient is 4.0 mmHg with average heart rate of 74 bpm. There is a 29 mm bioprosthetic valve present in the mitral position. Procedure Date: 05/11/2023. Echo findings are consistent with normal structure and function of the mitral valve prosthesis.  5. Tricuspid valve regurgitation is moderate.  6. The aortic valve is tricuspid. There is mild calcification of the aortic valve. Aortic valve regurgitation is trivial. Aortic valve sclerosis is present, with no evidence of aortic valve stenosis.  7. There is Moderate (Grade III) layered plaque involving the descending aorta and aortic arch. Conclusion(s)/Recommendation(s): No LA/LAA thrombus identified. Successful cardioversion performed with restoration of normal sinus rhythm. FINDINGS  Left Ventricle: Left ventricular ejection fraction, by estimation, is 50 to 55%. The left ventricle has low normal function. The left ventricular internal cavity size was normal in size. Right Ventricle: The right ventricular size is normal. No increase in right ventricular wall thickness. Right ventricular systolic function is normal. Left Atrium: Left atrial size was mild to moderately dilated. No left atrial/left atrial appendage thrombus was detected. The LAA emptying velocity was 63 cm/s. Right Atrium: Right atrial size was normal in size. Pericardium: There is no evidence of pericardial effusion. Mitral Valve: Normal bioprothestic MV. Normal function and no regurgitation. Assessed by 3D echo. The mitral valve has been repaired/replaced. No evidence of mitral valve regurgitation. There is a 29 mm bioprosthetic valve present in the mitral position.  Procedure Date: 05/11/2023. Echo findings are consistent with normal structure and function of the mitral valve prosthesis. MV peak gradient, 10.2 mmHg. The mean mitral valve gradient is 4.0 mmHg with average heart rate of  74 bpm. Tricuspid Valve: The tricuspid valve is grossly normal. Tricuspid valve regurgitation is moderate . No evidence of tricuspid stenosis. Aortic Valve: The aortic valve is tricuspid. There is mild calcification of the aortic valve. Aortic valve regurgitation is trivial. Aortic valve sclerosis is present, with no evidence of aortic valve stenosis. Pulmonic Valve: The pulmonic valve was grossly normal. Pulmonic valve regurgitation is not visualized. No evidence of pulmonic stenosis. Aorta: The aortic root and ascending aorta are structurally normal, with no evidence of dilitation. There is moderate (Grade III) layered plaque involving the descending aorta and aortic arch. Venous: The left lower pulmonary vein, left upper pulmonary vein, right upper pulmonary vein and right lower pulmonary vein are normal. IAS/Shunts: The atrial septum is grossly normal. Additional Comments: Spectral Doppler performed. LEFT VENTRICLE PLAX 2D LVOT diam:     1.80 cm LV SV:         67 LV SV Index:   35 LVOT Area:     2.54 cm  AORTIC VALVE LVOT Vmax:   124.00 cm/s LVOT Vmean:  96.000 cm/s LVOT VTI:    0.262 m  AORTA Ao Root diam: 2.82 cm Ao Asc diam:  3.05 cm MITRAL VALVE MV Area VTI:  1.75 cm   SHUNTS MV Peak grad: 10.2 mmHg  Systemic VTI:  0.26 m MV Mean grad: 4.0 mmHg   Systemic Diam: 1.80 cm MV Vmax:      1.60 m/s MV Vmean:     95.9 cm/s Darryle Decent MD Electronically signed by Darryle Decent MD Signature Date/Time: 07/02/2023/2:52:22 PM    Final (Updated)    EP STUDY Result Date: 07/02/2023 See surgical note for result.  ECHOCARDIOGRAM COMPLETE Result Date: 07/01/2023  ECHOCARDIOGRAM REPORT   Patient Name:   Rose [name] Date of Exam: 07/01/2023 Medical Rec #:  [id number]      Height:       63.0 in Accession #:    [id number]     Weight:       197.0 lb Date of Birth:  04-Dec-1955      BSA:          1.921 m Patient Age:    67 years       BP:           122/57 mmHg Patient Gender: F              HR:           72 bpm. Exam  Location:  Inpatient Procedure: 2D Echo, Cardiac Doppler and Color Doppler Indications:    Arrhythmia  History:        Patient has prior history of Echocardiogram examinations, most                 recent 05/09/2023. Stroke and COPD, Mitral Valve Disease; Risk                 Factors:Sleep Apnea, Hypertension and Diabetes.                  Mitral Valve: 29 mm Mosaic bioprosthetic valve valve is present                 in the mitral position. Procedure Date: 05/11/2023.  Sonographer:    Jayson Gaskins Referring Phys: [id number] ANASTASSIA DOUTOVA IMPRESSIONS  1. Left ventricular ejection fraction, by estimation, is 55 to 60%. Left ventricular ejection fraction by PLAX is 60 %. The left ventricle has normal function. The left ventricle has no regional wall motion abnormalities. Left ventricular diastolic function could not be evaluated.  2. Right ventricular systolic function is moderately reduced. The right ventricular size is normal. There is mildly elevated pulmonary artery systolic pressure. The estimated right ventricular systolic pressure is 39.6 mmHg.  3. The mitral valve has been repaired/replaced. Trivial mitral valve regurgitation. The mean mitral valve gradient is 6.3 mmHg. There is a 29 mm Mosaic bioprosthetic valve present in the mitral position. Procedure Date: 05/11/2023. Echo findings are consistent with normal structure and function of the mitral valve prosthesis.  4. A small pericardial effusion is present. The pericardial effusion is posterior to the left ventricle.  5. The aortic valve was not well visualized. Aortic valve regurgitation is not visualized. Aortic valve sclerosis/calcification is present, without any evidence of aortic stenosis. Aortic valve mean gradient measures 7.0 mmHg.  6. The inferior vena cava is normal in size with <50% respiratory variability, suggesting right atrial pressure of 8 mmHg. Comparison(s): Changes from prior study are noted. 05/09/2023: LVEF 60-65%, moderate to severe MR  - trivial pericardial effusion. Compared to this study, the mitral valve has been replaced with a bioprothesis. FINDINGS  Left Ventricle: Left ventricular ejection fraction, by estimation, is 55 to 60%. Left ventricular ejection fraction by PLAX is 60 %. The left ventricle has normal function. The left ventricle has no regional wall motion abnormalities. The left ventricular internal cavity size was normal in size. There is no left ventricular hypertrophy. Left ventricular diastolic function could not be evaluated due to mitral valve replacement. Left ventricular diastolic function could not be evaluated. Right Ventricle: The right ventricular size is normal. No increase in right ventricular wall thickness. Right ventricular systolic function is moderately reduced. There  is mildly elevated pulmonary artery systolic pressure. The tricuspid regurgitant velocity is 2.81 m/s, and with an assumed right atrial pressure of 8 mmHg, the estimated right ventricular systolic pressure is 39.6 mmHg. Left Atrium: Left atrial size was normal in size. Right Atrium: Right atrial size was normal in size. Pericardium: A small pericardial effusion is present. The pericardial effusion is posterior to the left ventricle. Mitral Valve: The mitral valve has been repaired/replaced. Trivial mitral valve regurgitation. There is a 29 mm Mosaic bioprosthetic valve present in the mitral position. Procedure Date: 05/11/2023. Echo findings are consistent with normal structure and function of the mitral valve prosthesis. MV peak gradient, 15.7 mmHg. The mean mitral valve gradient is 6.3 mmHg. Tricuspid Valve: The tricuspid valve is grossly normal. Tricuspid valve regurgitation is mild. Aortic Valve: The aortic valve was not well visualized. Aortic valve regurgitation is not visualized. Aortic valve sclerosis/calcification is present, without any evidence of aortic stenosis. Aortic valve mean gradient measures 7.0 mmHg. Aortic valve peak gradient  measures 12.4 mmHg. Aortic valve area, by VTI measures 1.70 cm. Pulmonic Valve: The pulmonic valve was normal in structure. Pulmonic valve regurgitation is not visualized. Aorta: The aortic root and ascending aorta are structurally normal, with no evidence of dilitation. Venous: The inferior vena cava is normal in size with less than 50% respiratory variability, suggesting right atrial pressure of 8 mmHg. IAS/Shunts: No atrial level shunt detected by color flow Doppler.  LEFT VENTRICLE PLAX 2D LV EF:         Left            Diastology                ventricular     LV e' medial:    5.55 cm/s                ejection        LV E/e' medial:  29.0                fraction by     LV e' lateral:   6.74 cm/s                PLAX is 60      LV E/e' lateral: 23.9                %. LVIDd:         4.40 cm LVIDs:         3.00 cm LV PW:         0.80 cm LV IVS:        0.70 cm LVOT diam:     1.80 cm LV SV:         72 LV SV Index:   38 LVOT Area:     2.54 cm  RIGHT VENTRICLE RV S prime:     7.83 cm/s LEFT ATRIUM             Index        RIGHT ATRIUM           Index LA Vol (A2C):   51.4 ml 26.75 ml/m  RA Area:     21.00 cm LA Vol (A4C):   50.8 ml 26.44 ml/m  RA Volume:   57.30 ml  29.83 ml/m LA Biplane Vol: 51.9 ml 27.01 ml/m  AORTIC VALVE AV Area (Vmax):    1.82 cm AV Area (Vmean):   1.87 cm AV Area (VTI):     1.70 cm AV Vmax:  176.00 cm/s AV Vmean:          131.000 cm/s AV VTI:            0.426 m AV Peak Grad:      12.4 mmHg AV Mean Grad:      7.0 mmHg LVOT Vmax:         126.00 cm/s LVOT Vmean:        96.200 cm/s LVOT VTI:          0.284 m LVOT/AV VTI ratio: 0.67 MITRAL VALVE                TRICUSPID VALVE MV Area (PHT): 3.68 cm     TR Peak grad:   31.6 mmHg MV Area VTI:   1.65 cm     TR Vmax:        281.00 cm/s MV Peak grad:  15.7 mmHg MV Mean grad:  6.3 mmHg     SHUNTS MV Vmax:       1.98 m/s     Systemic VTI:  0.28 m MV Vmean:      120.0 cm/s   Systemic Diam: 1.80 cm MV Decel Time: 206 msec MV E velocity:  161.00 cm/s MV A velocity: 107.00 cm/s MV E/A ratio:  1.50 Vinie Maxcy MD Electronically signed by Vinie Maxcy MD Signature Date/Time: 07/01/2023/1:07:43 PM    Final    CT Angio Chest Pulmonary Embolism (PE) W or WO Contrast Result Date: 06/30/2023 CLINICAL DATA:  Chest pain, dyspnea EXAM: CT ANGIOGRAPHY CHEST WITH CONTRAST TECHNIQUE: Multidetector CT imaging of the chest was performed using the standard protocol during bolus administration of intravenous contrast. Multiplanar CT image reconstructions and MIPs were obtained to evaluate the vascular anatomy. RADIATION DOSE REDUCTION: This exam was performed according to the departmental dose-optimization program which includes automated exposure control, adjustment of the mA and/or kV according to patient size and/or use of iterative reconstruction technique. CONTRAST:  75mL OMNIPAQUE  IOHEXOL  350 MG/ML SOLN COMPARISON:  06/06/2023 FINDINGS: Cardiovascular: There is adequate opacification of the pulmonary arterial tree. No intraluminal filling defect identified to suggest acute pulmonary embolism. The central pulmonary arteries are caliber. Mitral valve replacement has been performed. Mild global cardiomegaly is present, stable since prior examination. Small pericardial effusion is stable since prior examination. Mild atherosclerotic calcifications again seen within the thoracic aorta. No aortic aneurysm. Mediastinum/Nodes: Shotty mediastinal is present, stable since prior examination and nonspecific, possibly reactive in nature. There is, similarly, infiltration within the anterior mediastinum which appears similar prior examination and is in keeping with recent surgical intervention the esophagus is unremarkable. Visualized thyroid  is unremarkable. Lungs/Pleura: Stable band like atelectasis or scarring within the right lower lobe. Mild emphysema. Motion artifact slightly limits evaluation pulmonary parenchyma. No superimposed focal pulmonary infiltrate. No  pneumothorax or pleural effusion. Upper Abdomen: A punctate calcifications are noted throughout the visualized pancreatic parenchyma keeping with changes of pancreatitis. No superimposed acute peripancreatic inflammatory changes identified within visualized abdomen. Status post cholecystectomy. No acute abnormality. Musculoskeletal: Ununited median sternotomy again noted in keeping recent intervention. No acute bone abnormality. No lytic or blastic lesion. Review of the MIP images confirms the above findings. IMPRESSION: 1. No pulmonary embolism. No acute intrathoracic pathology identified. 2. Status post mitral valve replacement. Stable mild global cardiomegaly. Stable small pericardial effusion. 3. Mild emphysema 4. Stable changes of chronic pancreatitis. Aortic Atherosclerosis (ICD10-I70.0) and Emphysema (ICD10-J43.9). Electronically Signed   By: Dorethia Molt M.D.   On: 06/30/2023 23:33   DG Chest Portable 1 View  Result Date: 06/30/2023 CLINICAL DATA:  Chest pain and cough. Diagnosed with pneumonia in the twenty-third. Finished antibiotics but chest pain increasing. Atrial fibrillation. EXAM: PORTABLE CHEST 1 VIEW COMPARISON:  06/21/2023 FINDINGS: Cardiac enlargement with mild central vascular congestion. No focal consolidation or airspace disease. Left lung infiltrates seen previously are resolved. No pleural effusion. No pneumothorax. Mediastinal contours appear intact. IMPRESSION: Cardiac enlargement with mild vascular congestion. Electronically Signed   By: Elsie Gravely M.D.   On: 06/30/2023 17:07   DG Chest Port 1 View Result Date: 06/21/2023 CLINICAL DATA:  Chest pressure for 3 days, cough, weakness EXAM: PORTABLE CHEST 1 VIEW COMPARISON:  05/20/2023 FINDINGS: Single frontal view of the chest demonstrates an unremarkable cardiac silhouette. Linear consolidation at the right lung base consistent with atelectasis or scarring, stable. There is new patchy consolidation at the left lung base, which  could reflect hypoventilatory change or pneumonia. No effusion or pneumothorax. No acute bony abnormalities. IMPRESSION: 1. New patchy left basilar consolidation, which may reflect pneumonia or hypoventilatory change. Electronically Signed   By: Ozell Daring M.D.   On: 06/21/2023 21:40   CT Chest Wo Contrast Result Date: 06/16/2023 CLINICAL DATA:  Fracture, sternum s/p mvr Mitral valve problem.  Mitral valve replacement 05/11/2023 Patient reports sternal pain. EXAM: CT CHEST WITHOUT CONTRAST TECHNIQUE: Multidetector CT imaging of the chest was performed following the standard protocol without IV contrast. RADIATION DOSE REDUCTION: This exam was performed according to the departmental dose-optimization program which includes automated exposure control, adjustment of the mA and/or kV according to patient size and/or use of iterative reconstruction technique. COMPARISON:  Prior radiographs. Most recent lung cancer screening CT 09/26/2022 FINDINGS: Cardiovascular: Mild cardiomegaly. Prosthetic mitral valve. Thoracic aortic atherosclerosis without aneurysm. Small pericardial effusion. There is stranding in the anterior mediastinum subjacent to median sternotomy changes. Elongated retrosternal density is likely scarring, no focal fluid collection in the absence of IV contrast. No mediastinal gas. Mediastinum/Nodes: Mildly prominent shotty mediastinal lymph nodes, largest node in the right anterior paratracheal station measures 12 mm. hilar assessment is limited in the absence of IV contrast. Decompressed esophagus. No thyroid  nodule. Lungs/Pleura: Mild to moderate emphysema. Mild central bronchial thickening. The ill-defined ground-glass opacities on prior exam have resolved. There are multiple tiny calcified noncalcified pulmonary nodules are grossly stable. There is a bandlike area of scarring in the right lower lobe. No pleural effusion. No pulmonary fluid collection. Upper Abdomen: Pancreatic calcifications  consistent with chronic pancreatitis. No acute inflammatory change. Cholecystectomy. Musculoskeletal: Median sternotomy. Intact small sternal wires. The lower most sternal wire extends into the costal cartilage on the left. No evidence of sternal dehiscence. The sternotomy is healing. No subcutaneous collection.Minor thoracic spondylosis. IMPRESSION: 1. Median sternotomy with intact sternal wires. Stranding in the anterior mediastinum subjacent to median sternotomy changes is likely postsurgical. Elongated retrosternal density is likely scarring, no focal fluid collection in the absence of IV contrast. 2. Mild cardiomegaly with prosthetic mitral valve. Small pericardial effusion. 3. Multiple tiny calcified and noncalcified pulmonary nodules are grossly stable. Recommend continued annual lung cancer screening. Aortic Atherosclerosis (ICD10-I70.0) and Emphysema (ICD10-J43.9). Electronically Signed   By: Andrea Gasman M.D.   On: 06/16/2023 12:11    Microbiology: Recent Results (from the past 240 hours)  Resp panel by RT-PCR (RSV, Flu A&B, Covid) Anterior Nasal Swab     Status: None   Collection Time: 06/30/23  4:33 PM   Specimen: Anterior Nasal Swab  Result Value Ref Range Status   SARS Coronavirus 2 by RT PCR  NEGATIVE NEGATIVE Final   Influenza A by PCR NEGATIVE NEGATIVE Final   Influenza B by PCR NEGATIVE NEGATIVE Final    Comment: (NOTE) The Xpert Xpress SARS-CoV-2/FLU/RSV plus assay is intended as an aid in the diagnosis of influenza from Nasopharyngeal swab specimens and should not be used as a sole basis for treatment. Nasal washings and aspirates are unacceptable for Xpert Xpress SARS-CoV-2/FLU/RSV testing.  Fact Sheet for Patients: bloggercourse.com  Fact Sheet for Healthcare Providers: seriousbroker.it  This test is not yet approved or cleared by the [place]  FDA and has been authorized for detection and/or diagnosis of  SARS-CoV-2 by FDA under an Emergency Use Authorization (EUA). This EUA will remain in effect (meaning this test can be used) for the duration of the COVID-19 declaration under Section 564(b)(1) of the Act, 21 U.S.C. section 360bbb-3(b)(1), unless the authorization is terminated or revoked.     Resp Syncytial Virus by PCR NEGATIVE NEGATIVE Final    Comment: (NOTE) Fact Sheet for Patients: bloggercourse.com  Fact Sheet for Healthcare Providers: seriousbroker.it  This test is not yet approved or cleared by the [place]  FDA and has been authorized for detection and/or diagnosis of SARS-CoV-2 by FDA under an Emergency Use Authorization (EUA). This EUA will remain in effect (meaning this test can be used) for the duration of the COVID-19 declaration under Section 564(b)(1) of the Act, 21 U.S.C. section 360bbb-3(b)(1), unless the authorization is terminated or revoked.  Performed at [facility], [address] [place], [place] [address]   MRSA Next Gen by PCR, Nasal     Status: None   Collection Time: 06/30/23 11:37 PM   Specimen: Nasal Mucosa; Nasal Swab  Result Value Ref Range Status   MRSA by PCR Next Gen NOT DETECTED NOT DETECTED Final    Comment: (NOTE) The GeneXpert MRSA Assay (FDA approved for NASAL specimens only), is one component of a comprehensive MRSA colonization surveillance program. It is not intended to diagnose MRSA infection nor to guide or monitor treatment for MRSA infections. Test performance is not FDA approved in patients less than 72 years old. Performed at [facility], [address], [place], [place] [address]      Labs: Basic Metabolic Panel: Recent Labs  Lab 06/30/23 1725 06/30/23 2040 07/01/23 0501 07/02/23 0452 07/03/23 0516  NA 139  --  137 138 139  K 3.9  --  3.9 3.7 3.2*  CL 103  --  100 99 100  CO2 24  --  25 28 28   GLUCOSE 125*  --  208* 172* 119*  BUN 8  --  11 23  26*  CREATININE 1.01*  --  0.99 1.23* 1.56*  CALCIUM  8.9  --  8.7* 8.7* 8.3*  MG  --  1.9 2.1  --   --   PHOS  --  3.8 3.8  --   --    Liver Function Tests: Recent Labs  Lab 06/30/23 1725 06/30/23 2040 07/01/23 0501  AST 16 16 16   ALT 13 12 12   ALKPHOS 164* 164* 161*  BILITOT 0.4 0.3 0.2  PROT 6.5 6.6 6.6  ALBUMIN  3.0* 3.0* 2.9*   Recent Labs  Lab 06/30/23 2040  LIPASE 27   No results for input(s): AMMONIA in the last 168 hours. CBC: Recent Labs  Lab 06/30/23 1725 07/01/23 0501 07/02/23 0452 07/03/23 0516  WBC 6.9 3.9* 7.6 4.3  HGB 9.3* 9.2* 9.1* 8.7*  HCT 29.7* 29.3* 28.5* 27.2*  MCV 80.7 79.8*  79.2* 79.1*  PLT 224 233 259 184   Cardiac Enzymes: Recent Labs  Lab 06/30/23 2040  CKTOTAL 37*   BNP: BNP (last 3 results) Recent Labs    06/21/23 2121 06/30/23 1725  BNP 208.4* 292.6*    ProBNP (last 3 results) No results for input(s): PROBNP in the last 8760 hours.  CBG: Recent Labs  Lab 07/02/23 1815 07/02/23 1818 07/02/23 1935 07/03/23 0500 07/03/23 0744  GLUCAP 227* 193* 157* 121* 118*       Signed:  Sigurd Pac MD.  [facility] 07/03/2023, 12:01 PM

## 2023-07-03 NOTE — Telephone Encounter (Signed)
Pharmacy Patient Advocate Encounter  Insurance verification completed.    The patient is insured through [facility] Patient has Medicare and is not eligible for a copay card, but may be able to apply for patient assistance or Medicare RX Payment Plan (Patient Must reach out to their plan, if eligible for payment plan), if available.    Ran test claim for Lovenox 80mg  and the current 7 day co-pay is $0.00.  Ran test claim for Jardiance 10mg  and the current 30 day co-pay is $0.00.  Ran test claim for Farxiga 10mg  and the current 30 day co-pay is $0.00.  This test claim was processed through [facility]- copay amounts may vary at other pharmacies due to pharmacy/plan contracts, or as the patient moves through the different stages of their insurance plan.

## 2023-07-03 NOTE — Progress Notes (Addendum)
 Patient Name: Rose [name] Date of Encounter: 07/03/2023 [facility] Cardiologist: Oneil Parchment, MD   Interval Summary  .    Patient frustrated by still being in the hospital. Is otherwise feeling well today, admits feeling better back in sinus rhythm.   Vital Signs .    Vitals:   07/03/23 0008 07/03/23 0500 07/03/23 0745 07/03/23 0850  BP: (!) 98/52 (!) 111/56 (!) 110/58   Pulse: 91 82 86   Resp: 18 20 20    Temp: 97.8 F (36.6 C) 97.6 F (36.4 C) 97.6 F (36.4 C)   TempSrc: Oral Oral Oral   SpO2: 96% 99% 96% 92%  Weight:  85 kg    Height:        Intake/Output Summary (Last 24 hours) at 07/03/2023 0904 Last data filed at 07/03/2023 0815 Gross per 24 hour  Intake 942.57 ml  Output 600 ml  Net 342.57 ml      07/03/2023    5:00 AM 06/30/2023    4:21 PM 06/21/2023    6:32 PM  Last 3 Weights  Weight (lbs) 187 lb 8 oz 196 lb 15.7 oz 196 lb 15.7 oz  Weight (kg) 85.049 kg 89.35 kg 89.35 kg      Telemetry/ECG    Sinus rhythm, rates 70s-90s - Personally Reviewed  Physical Exam .   GEN: No acute distress.   Neck: No JVD Cardiac: RRR, no murmurs, rubs, or gallops.  Respiratory: Bilateral upper lobe inspiratory wheezing. Lower lobes with rhonchi GI: Soft, nontender, non-distended  MS: No edema  Assessment & Plan .     Atrial flutter  Patient presented to the ED with left side chest pain, shortness of breath, heart fluttering sensation 06/30/23. 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 Post procedure noted with afib and RVR but converted to NSR with amiodarone  bolus. She was started on Coumadin . Patient presented back to the ED on 06/21/23 with left side chest pain, found in rate controlled atrial flutter. No changes made to medications).   Patient in sinus rhythm today s/p DCCV. Continue Warfarin Unable to utilize beta blocker with low->low normal BP. Given that she has now had afib/flutter twice s/p MVR, will load oral Amiodarone , plan for ~3 month  course. Amiodarone  400 twice daily for 1 week then 200 twice daily for 2 weeks and then 200 a day thereafter.   Chronic diastolic CHF   TTE this admission shows stable/preserved LVEF. Moderately reduced RV function, appropriate functioning of prosthetic mitral valve.    Patient continues to appear euvolemic on exam today. No rales noted on pulmonary exam, upper lobe wheezing only.  Suspect some of her volume issues secondary to arrhythmia. Now that she is in sinus rhythm and is euvolemic, stop Lasix .  Consider adding SGLT2/MRA    Atypical chest pain CAD   Patient with left sided chest pain, worse with cough. Symptoms consistent with post-surgical pain, low suspicion for ischemic etiology. Mild disease on [facility] June 2024.    Continue statin, warfarin.    Per primary team: DM type II Tobacco use COPD  For questions or updates, please contact [facility] Please consult [email or web address] for contact info under        Signed, Artist Pouch, PA-C   Personally seen and examined. Agree with above.  Successful TEE cardioversion. Agree with amiodarone  200 mg twice daily for 2 weeks then 200 mg a day thereafter.  This will be helpful in the postoperative period.  Hopefully this  will be short-term for about 90 days. Postop mitral valve replacement 05/11/2023. -Continue warfarin for anticoagulation - monitored by [facility]. INR 1.6. Would be optimal to have her set up with Lovenox  bridge given DCCV.  -We will go ahead and sign off.  Okay with discharge.  Close follow-up.  Oneil Parchment, MD

## 2023-07-03 NOTE — Progress Notes (Addendum)
 PHARMACY - ANTICOAGULATION CONSULT NOTE  Pharmacy Consult for heparin  & warfarin Indication: atrial fibrillation/ mitral valve  Patient Measurements: Height: 5' 3 (160 cm) Weight: 85 kg (187 lb 8 oz) IBW/kg (Calculated) : 52.4 Heparin  Dosing Weight: 73 kg  Vital Signs: Temp: 97.6 F (36.4 C) (02/04 0745) Temp Source: Oral (02/04 0745) BP: 110/58 (02/04 0745) Pulse Rate: 86 (02/04 0745)  Labs: Recent Labs    06/30/23 1725 06/30/23 1833 06/30/23 2040 07/01/23 0501 07/01/23 1042 07/02/23 0452 07/02/23 0500 07/02/23 0634 07/03/23 0516  HGB 9.3*  --   --  9.2*  --  9.1*  --   --  8.7*  HCT 29.7*  --   --  29.3*  --  28.5*  --   --  27.2*  PLT 224  --   --  233  --  259  --   --  184  LABPROT 15.9*  --   --  16.4*  --   --  16.9*  --  19.4*  INR 1.3*  --   --  1.3*  --   --  1.4*  --  1.6*  HEPARINUNFRC  --   --   --   --    < >  --  >1.10* 0.61 0.72*  CREATININE 1.01*  --   --  0.99  --  1.23*  --   --  1.56*  CKTOTAL  --   --  37*  --   --   --   --   --   --   TROPONINIHS 11 12  --   --   --   --   --   --   --    < > = values in this interval not displayed.    Estimated Creatinine Clearance: 36.6 mL/min (A) (by C-G formula based on SCr of 1.56 mg/dL (H)).  Medications:  PTA warfarin: 3.75mg  PO every MWF and 2.5 mg PO all other days  Assessment: Rose [name] presented to the ED with chest pain. Pharmacy consulted to dose heparin  and warfarin for atrial fibrillation and recent bioprosthetic mitral valve (05/11/2023). She is noted s/p DCCV 2/4 and plans to change from IV heparin  to lovenox   -INR=1.6 (trend up) -Hg 8.7 (low/stable) -SCr= 1.56 (Baseline ~ 1.0-1.3)   Prior to admission warfarin dosing: 3.75 mg (2.5 mg x 1.5) every Mon, Wed, Fri; 2.5 mg (2.5 mg x 1) all other days   Goal of Therapy:  INR 2.5-3 (per last anticoag clinic note) Monitor platelets by anticoagulation protocol: Yes   Plan:  -Lovenox  80mg  sq q12h (copay= $0) -Would send home on home warfarin  regimen   Prentice Poisson, PharmD Clinical Pharmacist **Pharmacist phone directory can now be found on [email or web address] ([email or web address]).  Listed under [facility].

## 2023-07-03 NOTE — Progress Notes (Signed)
 PROGRESS NOTE    Rose [name]  FMW:[id number] DOB: [birth year] DOA: 06/30/2023 PCP: Arloa Jarvis, NP  Chronically ill 68/F with COPD, ongoing tobacco abuse, CAD, recent mitral valve replacement bioprosthetic valve 12/24, type 2 diabetes mellitus, history of pancreatitis, urogenital implant, OSA, CVA presented to the ED with chest pain and shortness of breath.  Reports being recently diagnosed with pneumonia on 1/23, completed antibiotics.  Patient reports ongoing chest pain since open heart surgery over a month ago, but worsening dyspnea recently, after surgery 12/13 developed transient A-fib, was discharged on Coumadin  In the ED VSS, BNP 292, troponin 11, 12, INR 1.3, hemoglobin 9.3, creatinine 1.0, flu and RSV PCR negative, CTA chest with no PE and no acute pulmonary findings, noted to be in atrial flutter -TEE/DCCV to sinus rhythm 2/3  Subjective: -Feels better, cardioverted yesterday  Assessment and Plan:  Acute on chronic diastolic CHF (congestive heart failure) (HCC) -Last echo 12/24 before valve replacement with EF 60%, indeterminate diastolic function, RV low normal, severe mitral regurgitation and stenosis -Repeat echo with preserved EF, moderately reduced RV, normal function of prosthetic mitral valve -Volume status has improved, creatinine higher, appears euvolemic, will hold Lasix , likely related to atrial flutter, changed to as needed Lasix  at discharge -Low-dose midodrine  continued  Recent MVR -Echo reviewed  Ongoing chest pain -For over a month, likely related to recent MVR, CTA chest without acute findings, supportive care   Atrial flutter w RVR -Now off diltiazem , underwent TEE and cardioversion yesterday -Switched to oral amiodarone  today -INR subtherapeutic at 1.6, Lovenox  teaching, anticipate need for 2 to 3 days of subcu Lovenox    Tobacco abuse  - Counseled    Type 2 diabetes mellitus without complications (HCC)  - CBGs suboptimal, A1c is 6.3     COPD/chronic respiratory failure on 2 L home O2 -Clinically do not suspect exacerbation -Continue nebs, discontinued steroids  DVT prophylaxis:  Heparin  drip Code Status: Full code Family Communication: None present Disposition Plan: Home likely tomorrow  Consultants:    Procedures:   Antimicrobials:    Objective: Vitals:   07/03/23 0008 07/03/23 0500 07/03/23 0745 07/03/23 0850  BP: (!) 98/52 (!) 111/56 (!) 110/58   Pulse: 91 82 86   Resp: 18 20 20    Temp: 97.8 F (36.6 C) 97.6 F (36.4 C) 97.6 F (36.4 C)   TempSrc: Oral Oral Oral   SpO2: 96% 99% 96% 92%  Weight:  85 kg    Height:        Intake/Output Summary (Last 24 hours) at 07/03/2023 1021 Last data filed at 07/03/2023 0815 Gross per 24 hour  Intake 942.57 ml  Output 600 ml  Net 342.57 ml   Filed Weights   06/30/23 1621 07/03/23 0500  Weight: 89.4 kg 85 kg    Examination:  General exam: Chronically ill female sitting up in bed, AAOx3 HEENT: No JVD CVS: S1-S2, regular rhythm Lungs: Clear bilaterally, decreased at the bases Abdomen: Soft, nontender, bowel sounds present Extremities: No edema Skin: No rashes Psychiatry:  Mood & affect appropriate.     Data Reviewed:   CBC: Recent Labs  Lab 06/30/23 1725 07/01/23 0501 07/02/23 0452 07/03/23 0516  WBC 6.9 3.9* 7.6 4.3  HGB 9.3* 9.2* 9.1* 8.7*  HCT 29.7* 29.3* 28.5* 27.2*  MCV 80.7 79.8* 79.2* 79.1*  PLT 224 233 259 184   Basic Metabolic Panel: Recent Labs  Lab 06/30/23 1725 06/30/23 2040 07/01/23 0501 07/02/23 0452 07/03/23 0516  NA 139  --  137 138 139  K 3.9  --  3.9 3.7 3.2*  CL 103  --  100 99 100  CO2 24  --  25 28 28   GLUCOSE 125*  --  208* 172* 119*  BUN 8  --  11 23 26*  CREATININE 1.01*  --  0.99 1.23* 1.56*  CALCIUM  8.9  --  8.7* 8.7* 8.3*  MG  --  1.9 2.1  --   --   PHOS  --  3.8 3.8  --   --    GFR: Estimated Creatinine Clearance: 36.6 mL/min (A) (by C-G formula based on SCr of 1.56 mg/dL (H)). Liver Function  Tests: Recent Labs  Lab 06/30/23 1725 06/30/23 2040 07/01/23 0501  AST 16 16 16   ALT 13 12 12   ALKPHOS 164* 164* 161*  BILITOT 0.4 0.3 0.2  PROT 6.5 6.6 6.6  ALBUMIN  3.0* 3.0* 2.9*   Recent Labs  Lab 06/30/23 2040  LIPASE 27   No results for input(s): AMMONIA in the last 168 hours. Coagulation Profile: Recent Labs  Lab 06/30/23 1725 07/01/23 0501 07/02/23 0500 07/03/23 0516  INR 1.3* 1.3* 1.4* 1.6*   Cardiac Enzymes: Recent Labs  Lab 06/30/23 2040  CKTOTAL 37*   BNP (last 3 results) No results for input(s): PROBNP in the last 8760 hours. HbA1C: Recent Labs    06/30/23 2040  HGBA1C 6.3*   CBG: Recent Labs  Lab 07/02/23 1815 07/02/23 1818 07/02/23 1935 07/03/23 0500 07/03/23 0744  GLUCAP 227* 193* 157* 121* 118*   Lipid Profile: No results for input(s): CHOL, HDL, LDLCALC, TRIG, CHOLHDL, LDLDIRECT in the last 72 hours. Thyroid  Function Tests: Recent Labs    06/30/23 2040  TSH 1.510   Anemia Panel: Recent Labs    06/30/23 2040 07/01/23 0501  VITAMINB12  --  226  FOLATE  --  17.0  FERRITIN 17  --   TIBC 455*  --   IRON 29  --   RETICCTPCT 2.0  --    Urine analysis:    Component Value Date/Time   COLORURINE YELLOW 06/30/2023 2129   APPEARANCEUR CLEAR 06/30/2023 2129   LABSPEC 1.015 06/30/2023 2129   PHURINE 5.0 06/30/2023 2129   GLUCOSEU NEGATIVE 06/30/2023 2129   HGBUR NEGATIVE 06/30/2023 2129   BILIRUBINUR NEGATIVE 06/30/2023 2129   KETONESUR NEGATIVE 06/30/2023 2129   PROTEINUR NEGATIVE 06/30/2023 2129   UROBILINOGEN 0.2 02/03/2009 1447   NITRITE NEGATIVE 06/30/2023 2129   LEUKOCYTESUR NEGATIVE 06/30/2023 2129   Sepsis Labs: @LABRCNTIP (procalcitonin:4,lacticidven:4)  ) Recent Results (from the past 240 hours)  Resp panel by RT-PCR (RSV, Flu A&B, Covid) Anterior Nasal Swab     Status: None   Collection Time: 06/30/23  4:33 PM   Specimen: Anterior Nasal Swab  Result Value Ref Range Status   SARS Coronavirus 2  by RT PCR NEGATIVE NEGATIVE Final   Influenza A by PCR NEGATIVE NEGATIVE Final   Influenza B by PCR NEGATIVE NEGATIVE Final    Comment: (NOTE) The Xpert Xpress SARS-CoV-2/FLU/RSV plus assay is intended as an aid in the diagnosis of influenza from Nasopharyngeal swab specimens and should not be used as a sole basis for treatment. Nasal washings and aspirates are unacceptable for Xpert Xpress SARS-CoV-2/FLU/RSV testing.  Fact Sheet for Patients: bloggercourse.com  Fact Sheet for Healthcare Providers: seriousbroker.it  This test is not yet approved or cleared by the [place]  FDA and has been authorized for detection and/or diagnosis of SARS-CoV-2 by FDA under an Emergency Use Authorization (EUA). This EUA will remain in  effect (meaning this test can be used) for the duration of the COVID-19 declaration under Section 564(b)(1) of the Act, 21 U.S.C. section 360bbb-3(b)(1), unless the authorization is terminated or revoked.     Resp Syncytial Virus by PCR NEGATIVE NEGATIVE Final    Comment: (NOTE) Fact Sheet for Patients: bloggercourse.com  Fact Sheet for Healthcare Providers: seriousbroker.it  This test is not yet approved or cleared by the [place]  FDA and has been authorized for detection and/or diagnosis of SARS-CoV-2 by FDA under an Emergency Use Authorization (EUA). This EUA will remain in effect (meaning this test can be used) for the duration of the COVID-19 declaration under Section 564(b)(1) of the Act, 21 U.S.C. section 360bbb-3(b)(1), unless the authorization is terminated or revoked.  Performed at [facility], [address], [place], [place] [address]   MRSA Next Gen by PCR, Nasal     Status: None   Collection Time: 06/30/23 11:37 PM   Specimen: Nasal Mucosa; Nasal Swab  Result Value Ref Range Status   MRSA by PCR Next Gen NOT DETECTED NOT DETECTED  Final    Comment: (NOTE) The GeneXpert MRSA Assay (FDA approved for NASAL specimens only), is one component of a comprehensive MRSA colonization surveillance program. It is not intended to diagnose MRSA infection nor to guide or monitor treatment for MRSA infections. Test performance is not FDA approved in patients less than 30 years old. Performed at [facility], [address] [place], [place] [address]      Radiology Studies: ECHO TEE Result Date: 07/02/2023    TRANSESOPHOGEAL ECHO REPORT   Patient Name:   KEIANNA SIGNER Date of Exam: 07/02/2023 Medical Rec #:  [id number]      Height:       63.0 in Accession #:    [id number]     Weight:       197.0 lb Date of Birth:  [birth year]      BSA:          1.921 m Patient Age:    67 years       BP:           106/77 mmHg Patient Gender: F              HR:           75 bpm. Exam Location:  Inpatient Procedure: Transesophageal Echo, 3D Echo, Cardiac Doppler and Color Doppler Indications:     I48.92* Unspecified atrial flutter  History:         Patient has prior history of Echocardiogram examinations, most                  recent 07/01/2023. CHF, CAD, COPD; Risk Factors:Diabetes and                  Dyslipidemia.                   Mitral Valve: 29 mm bioprosthetic valve valve is present in the                  mitral position. Procedure Date: 05/11/2023.  Sonographer:     Damien Senior RDCS Referring Phys:  [id number] DARRYLE NED O'NEAL Diagnosing Phys: Darryle Decent MD PROCEDURE: After discussion of the risks and benefits of a TEE, an informed consent was obtained from the patient. TEE procedure time was 7 minutes. The transesophogeal probe was passed without difficulty through the esophogus of the patient. Sedation performed by different  physician. The patient was monitored while under deep sedation. Anesthestetic sedation was provided intravenously by Anesthesiology: 264mg  of Propofol . Image quality was excellent. The patient's vital signs; including heart  rate, blood pressure, and oxygen saturation; remained stable throughout the procedure. The patient developed no complications during the procedure. A successful direct current cardioversion was performed at 200 joules with 1 attempt.  IMPRESSIONS  1. Left ventricular ejection fraction, by estimation, is 50 to 55%. The left ventricle has low normal function.  2. Right ventricular systolic function is normal. The right ventricular size is normal.  3. Left atrial size was mild to moderately dilated. No left atrial/left atrial appendage thrombus was detected. The LAA emptying velocity was 63 cm/s.  4. Normal bioprothestic MV. Normal function and no regurgitation. Assessed by 3D echo. The mitral valve has been repaired/replaced. No evidence of mitral valve regurgitation. The mean mitral valve gradient is 4.0 mmHg with average heart rate of 74 bpm. There is a 29 mm bioprosthetic valve present in the mitral position. Procedure Date: 05/11/2023. Echo findings are consistent with normal structure and function of the mitral valve prosthesis.  5. Tricuspid valve regurgitation is moderate.  6. The aortic valve is tricuspid. There is mild calcification of the aortic valve. Aortic valve regurgitation is trivial. Aortic valve sclerosis is present, with no evidence of aortic valve stenosis.  7. There is Moderate (Grade III) layered plaque involving the descending aorta and aortic arch. Conclusion(s)/Recommendation(s): No LA/LAA thrombus identified. Successful cardioversion performed with restoration of normal sinus rhythm. FINDINGS  Left Ventricle: Left ventricular ejection fraction, by estimation, is 50 to 55%. The left ventricle has low normal function. The left ventricular internal cavity size was normal in size. Right Ventricle: The right ventricular size is normal. No increase in right ventricular wall thickness. Right ventricular systolic function is normal. Left Atrium: Left atrial size was mild to moderately dilated. No  left atrial/left atrial appendage thrombus was detected. The LAA emptying velocity was 63 cm/s. Right Atrium: Right atrial size was normal in size. Pericardium: There is no evidence of pericardial effusion. Mitral Valve: Normal bioprothestic MV. Normal function and no regurgitation. Assessed by 3D echo. The mitral valve has been repaired/replaced. No evidence of mitral valve regurgitation. There is a 29 mm bioprosthetic valve present in the mitral position.  Procedure Date: 05/11/2023. Echo findings are consistent with normal structure and function of the mitral valve prosthesis. MV peak gradient, 10.2 mmHg. The mean mitral valve gradient is 4.0 mmHg with average heart rate of 74 bpm. Tricuspid Valve: The tricuspid valve is grossly normal. Tricuspid valve regurgitation is moderate . No evidence of tricuspid stenosis. Aortic Valve: The aortic valve is tricuspid. There is mild calcification of the aortic valve. Aortic valve regurgitation is trivial. Aortic valve sclerosis is present, with no evidence of aortic valve stenosis. Pulmonic Valve: The pulmonic valve was grossly normal. Pulmonic valve regurgitation is not visualized. No evidence of pulmonic stenosis. Aorta: The aortic root and ascending aorta are structurally normal, with no evidence of dilitation. There is moderate (Grade III) layered plaque involving the descending aorta and aortic arch. Venous: The left lower pulmonary vein, left upper pulmonary vein, right upper pulmonary vein and right lower pulmonary vein are normal. IAS/Shunts: The atrial septum is grossly normal. Additional Comments: Spectral Doppler performed. LEFT VENTRICLE PLAX 2D LVOT diam:     1.80 cm LV SV:         67 LV SV Index:   35 LVOT Area:     2.54  cm  AORTIC VALVE LVOT Vmax:   124.00 cm/s LVOT Vmean:  96.000 cm/s LVOT VTI:    0.262 m  AORTA Ao Root diam: 2.82 cm Ao Asc diam:  3.05 cm MITRAL VALVE MV Area VTI:  1.75 cm   SHUNTS MV Peak grad: 10.2 mmHg  Systemic VTI:  0.26 m MV Mean  grad: 4.0 mmHg   Systemic Diam: 1.80 cm MV Vmax:      1.60 m/s MV Vmean:     95.9 cm/s Darryle Decent MD Electronically signed by Darryle Decent MD Signature Date/Time: 07/02/2023/2:52:22 PM    Final (Updated)    EP STUDY Result Date: 07/02/2023 See surgical note for result.  ECHOCARDIOGRAM COMPLETE Result Date: 07/01/2023    ECHOCARDIOGRAM REPORT   Patient Name:   Rose [name] Date of Exam: 07/01/2023 Medical Rec #:  [id number]      Height:       63.0 in Accession #:    [id number]     Weight:       197.0 lb Date of Birth:  18-Oct-1955      BSA:          1.921 m Patient Age:    67 years       BP:           122/57 mmHg Patient Gender: F              HR:           72 bpm. Exam Location:  Inpatient Procedure: 2D Echo, Cardiac Doppler and Color Doppler Indications:    Arrhythmia  History:        Patient has prior history of Echocardiogram examinations, most                 recent 05/09/2023. Stroke and COPD, Mitral Valve Disease; Risk                 Factors:Sleep Apnea, Hypertension and Diabetes.                  Mitral Valve: 29 mm Mosaic bioprosthetic valve valve is present                 in the mitral position. Procedure Date: 05/11/2023.  Sonographer:    Jayson Gaskins Referring Phys: [id number] ANASTASSIA DOUTOVA IMPRESSIONS  1. Left ventricular ejection fraction, by estimation, is 55 to 60%. Left ventricular ejection fraction by PLAX is 60 %. The left ventricle has normal function. The left ventricle has no regional wall motion abnormalities. Left ventricular diastolic function could not be evaluated.  2. Right ventricular systolic function is moderately reduced. The right ventricular size is normal. There is mildly elevated pulmonary artery systolic pressure. The estimated right ventricular systolic pressure is 39.6 mmHg.  3. The mitral valve has been repaired/replaced. Trivial mitral valve regurgitation. The mean mitral valve gradient is 6.3 mmHg. There is a 29 mm Mosaic bioprosthetic valve present in the mitral  position. Procedure Date: 05/11/2023. Echo findings are consistent with normal structure and function of the mitral valve prosthesis.  4. A small pericardial effusion is present. The pericardial effusion is posterior to the left ventricle.  5. The aortic valve was not well visualized. Aortic valve regurgitation is not visualized. Aortic valve sclerosis/calcification is present, without any evidence of aortic stenosis. Aortic valve mean gradient measures 7.0 mmHg.  6. The inferior vena cava is normal in size with <50% respiratory variability, suggesting right atrial pressure of 8 mmHg. Comparison(s): Changes from prior  study are noted. 05/09/2023: LVEF 60-65%, moderate to severe MR - trivial pericardial effusion. Compared to this study, the mitral valve has been replaced with a bioprothesis. FINDINGS  Left Ventricle: Left ventricular ejection fraction, by estimation, is 55 to 60%. Left ventricular ejection fraction by PLAX is 60 %. The left ventricle has normal function. The left ventricle has no regional wall motion abnormalities. The left ventricular internal cavity size was normal in size. There is no left ventricular hypertrophy. Left ventricular diastolic function could not be evaluated due to mitral valve replacement. Left ventricular diastolic function could not be evaluated. Right Ventricle: The right ventricular size is normal. No increase in right ventricular wall thickness. Right ventricular systolic function is moderately reduced. There is mildly elevated pulmonary artery systolic pressure. The tricuspid regurgitant velocity is 2.81 m/s, and with an assumed right atrial pressure of 8 mmHg, the estimated right ventricular systolic pressure is 39.6 mmHg. Left Atrium: Left atrial size was normal in size. Right Atrium: Right atrial size was normal in size. Pericardium: A small pericardial effusion is present. The pericardial effusion is posterior to the left ventricle. Mitral Valve: The mitral valve has been  repaired/replaced. Trivial mitral valve regurgitation. There is a 29 mm Mosaic bioprosthetic valve present in the mitral position. Procedure Date: 05/11/2023. Echo findings are consistent with normal structure and function of the mitral valve prosthesis. MV peak gradient, 15.7 mmHg. The mean mitral valve gradient is 6.3 mmHg. Tricuspid Valve: The tricuspid valve is grossly normal. Tricuspid valve regurgitation is mild. Aortic Valve: The aortic valve was not well visualized. Aortic valve regurgitation is not visualized. Aortic valve sclerosis/calcification is present, without any evidence of aortic stenosis. Aortic valve mean gradient measures 7.0 mmHg. Aortic valve peak gradient measures 12.4 mmHg. Aortic valve area, by VTI measures 1.70 cm. Pulmonic Valve: The pulmonic valve was normal in structure. Pulmonic valve regurgitation is not visualized. Aorta: The aortic root and ascending aorta are structurally normal, with no evidence of dilitation. Venous: The inferior vena cava is normal in size with less than 50% respiratory variability, suggesting right atrial pressure of 8 mmHg. IAS/Shunts: No atrial level shunt detected by color flow Doppler.  LEFT VENTRICLE PLAX 2D LV EF:         Left            Diastology                ventricular     LV e' medial:    5.55 cm/s                ejection        LV E/e' medial:  29.0                fraction by     LV e' lateral:   6.74 cm/s                PLAX is 60      LV E/e' lateral: 23.9                %. LVIDd:         4.40 cm LVIDs:         3.00 cm LV PW:         0.80 cm LV IVS:        0.70 cm LVOT diam:     1.80 cm LV SV:         72 LV SV Index:   38 LVOT Area:  2.54 cm  RIGHT VENTRICLE RV S prime:     7.83 cm/s LEFT ATRIUM             Index        RIGHT ATRIUM           Index LA Vol (A2C):   51.4 ml 26.75 ml/m  RA Area:     21.00 cm LA Vol (A4C):   50.8 ml 26.44 ml/m  RA Volume:   57.30 ml  29.83 ml/m LA Biplane Vol: 51.9 ml 27.01 ml/m  AORTIC VALVE AV Area  (Vmax):    1.82 cm AV Area (Vmean):   1.87 cm AV Area (VTI):     1.70 cm AV Vmax:           176.00 cm/s AV Vmean:          131.000 cm/s AV VTI:            0.426 m AV Peak Grad:      12.4 mmHg AV Mean Grad:      7.0 mmHg LVOT Vmax:         126.00 cm/s LVOT Vmean:        96.200 cm/s LVOT VTI:          0.284 m LVOT/AV VTI ratio: 0.67 MITRAL VALVE                TRICUSPID VALVE MV Area (PHT): 3.68 cm     TR Peak grad:   31.6 mmHg MV Area VTI:   1.65 cm     TR Vmax:        281.00 cm/s MV Peak grad:  15.7 mmHg MV Mean grad:  6.3 mmHg     SHUNTS MV Vmax:       1.98 m/s     Systemic VTI:  0.28 m MV Vmean:      120.0 cm/s   Systemic Diam: 1.80 cm MV Decel Time: 206 msec MV E velocity: 161.00 cm/s MV A velocity: 107.00 cm/s MV E/A ratio:  1.50 Vinie Maxcy MD Electronically signed by Vinie Maxcy MD Signature Date/Time: 07/01/2023/1:07:43 PM    Final      Scheduled Meds:  amiodarone   400 mg Oral BID   clonazePAM   1 mg Oral TID   enoxaparin    Does not apply Once   gabapentin   400 mg Oral TID   guaiFENesin   600 mg Oral BID   insulin  aspart  0-9 Units Subcutaneous Q4H   ipratropium-albuterol   3 mL Nebulization Q6H   midodrine   5 mg Oral BID   pantoprazole   40 mg Oral Daily   rOPINIRole   1 mg Oral QHS   rosuvastatin   40 mg Oral QHS   sertraline   50 mg Oral q morning   traZODone   150 mg Oral QHS   Warfarin - Pharmacist Dosing Inpatient   Does not apply q1600   Continuous Infusions:     LOS: 2 days    Time spent:    Sigurd Pac, MD [facility]   07/03/2023, 10:21 AM

## 2023-07-03 NOTE — Plan of Care (Signed)
  Problem: Metabolic: Goal: Ability to maintain appropriate glucose levels will improve Outcome: Progressing   Problem: Nutritional: Goal: Maintenance of adequate nutrition will improve Outcome: Progressing   Problem: Cardiac: Goal: Ability to achieve and maintain adequate cardiopulmonary perfusion will improve Outcome: Progressing   Problem: Clinical Measurements: Goal: Respiratory complications will improve Outcome: Progressing Goal: Cardiovascular complication will be avoided Outcome: Progressing   Problem: Safety: Goal: Ability to remain free from injury will improve Outcome: Progressing

## 2023-07-03 NOTE — Telephone Encounter (Signed)
Denyse Amass, CM with [facility] contacted the office requesting resumption of care for patient since she was discharged from the hospital. Verbal orders requested for 1w1 for nursing and PT. Verbal orders given. She acknowledged receipt.

## 2023-07-03 NOTE — Telephone Encounter (Signed)
 Refill was sent today by Dr Sigurd. Pt is overdue to have INR checked at anticoagulation clinic. Per discharge summary, INR should be checked Friday. Called pt to schedule next available appt with anticoagulation clinic, no answer. Left message on voicemail.

## 2023-07-03 NOTE — Progress Notes (Signed)
 Assumed over patient's discharge teaching and instructions with AVS from another RN Isaiah who was going over it with patient. Patient verbalize understanding of content in AVS including medications, follow-up appointments and instructions in the AVS. A copy of it given to patient to take home. Patient has all her belongings with her to take home. Patient is being transported to discharge lounge to await for her transportation. RN called patient's sister to let her aware.

## 2023-07-03 NOTE — TOC Initial Note (Signed)
 Transition of Care [facility]) - Initial/Assessment Note    Patient Details  Name: Rose [name] MRN: [id number] Date of Birth: 12-19-1955  Transition of Care [facility]) CM/SW Contact:    Sudie Erminio Deems, RN Phone Number: 07/03/2023, 1:24 PM  Clinical Narrative: Patient presented for chest pain and acute on chronic diastolic congestive heart failure. PTA patient was from home with sister in an apartment. Patient is asking that [employer] deliver portable oxygen tank to the hospital before she leaves. Case Manager called [employer] and DME company will deliver to the discharge lounge. No further home needs identified at this time.   Expected Discharge Plan: Home/Self Care Barriers to Discharge: No Barriers Identified   Patient Goals and CMS Choice Patient states their goals for this hospitalization and ongoing recovery are:: to return to apartment   Expected Discharge Plan and Services In-house Referral: NA   Post Acute Care Choice: NA Living arrangements for the past 2 months: Apartment Expected Discharge Date: 07/03/23               DME Arranged: Oxygen (needed portable tank delivered to the room) DME Agency: [employer] Date DME Agency Contacted: 07/03/23 Time DME Agency Contacted: 1245 Representative spoke with at DME Agency: [facility] HH Arranged: NA          Prior Living Arrangements/Services Living arrangements for the past 2 months: Apartment Lives with:: Siblings Patient language and need for interpreter reviewed:: Yes Do you feel safe going back to the place where you live?: Yes      Need for Family Participation in Patient Care: Yes (Comment) Care giver support system in place?: Yes (comment) Current home services: DME (oxygen from [employer]) Criminal Activity/Legal Involvement Pertinent to Current Situation/Hospitalization: No - Comment as needed  Activities of Daily Living   ADL Screening (condition at time of admission) Independently performs ADLs?: Yes (appropriate for  developmental age) Is the patient deaf or have difficulty hearing?: No Does the patient have difficulty seeing, even when wearing glasses/contacts?: No Does the patient have difficulty concentrating, remembering, or making decisions?: No  Permission Sought/Granted         Permission granted to share info w AGENCY: [employer]        Emotional Assessment Appearance:: Appears stated age Attitude/Demeanor/Rapport: Engaged Affect (typically observed): Appropriate Orientation: : Oriented to Place, Oriented to Self, Oriented to  Time, Oriented to Situation Alcohol / Substance Use: Not Applicable Psych Involvement: No (comment)  Admission diagnosis:  CHF (congestive heart failure) (HCC) [I50.9] Persistent atrial fibrillation (HCC) [I48.19] COPD exacerbation (HCC) [J44.1] Acute on chronic diastolic CHF (congestive heart failure) (HCC) [I50.33] Patient Active Problem List   Diagnosis Date Noted   Typical atrial flutter (HCC) 07/01/2023   CHF (congestive heart failure) (HCC) 07/01/2023   Acute on chronic diastolic CHF (congestive heart failure) (HCC) 06/30/2023   COPD with acute exacerbation (HCC) 06/30/2023   Chronic respiratory failure with hypoxia (HCC) 06/30/2023   Chest pain 06/30/2023   Long term (current) use of anticoagulants 05/28/2023   Atrial fibrillation (HCC) 05/28/2023   S/P mitral valve replacement 05/11/2023   Endotracheally intubated 05/11/2023   Severe tricuspid regurgitation 05/11/2023   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   Anxiety 09/15/2020   Constipation 09/15/2020   DDD (degenerative disc disease), lumbar 09/15/2020   Elevated liver enzymes 09/15/2020   Fatty liver 09/15/2020   Mitral regurgitation 09/15/2020   Mobility impaired 09/15/2020   Multiple allergies 09/15/2020   Arthritis 09/15/2020   Risk for falls 09/15/2020   Shortness of breath 09/15/2020  Stroke (HCC) 09/15/2020   Ventral hernia 09/15/2020   Vitiligo 09/15/2020   Acute hypoxemic  respiratory failure (HCC) 12/30/2018   Anemia of chronic disease 12/30/2018   Cough 12/30/2018   Homelessness 12/30/2018   Polysubstance abuse (HCC) 12/30/2018   Suicidal ideation 12/30/2018   Severe recurrent major depression without psychotic features (HCC) 12/12/2018   Chronic hoarseness 02/26/2018   Parotid nodule 02/26/2018   Parotid sialolithiasis 02/26/2018   Generalized abdominal pain 05/11/2016   GERD (gastroesophageal reflux disease) 05/11/2016   Hematochezia 05/11/2016   History of colon polyps 05/11/2016   Nausea 05/11/2016   ADD (attention deficit disorder) 03/24/2016   Complaints of memory disturbance 03/24/2016   Depression 03/24/2016   Eczematous dermatitis of upper eyelids of both eyes 01/19/2016   Regular astigmatism of both eyes 01/19/2016   Stress at home 11/16/2015   Deformity of metatarsal bone of left foot 04/16/2015   Pain in lower limb 03/30/2015   Sinus tarsitis of left foot 03/30/2015   Arthritis of ankle, left, degenerative 03/30/2015   COPD, mild 03/24/2015   Cigarette smoker 03/24/2015   Trochanteric bursitis of left hip 07/20/2014   Osteoarthritis of right subtalar joint 05/07/2014   Pain due to any device, implant or graft 05/07/2014   Post-traumatic osteoarthritis of right ankle 05/07/2014   Achilles tendinitis of left lower extremity 05/07/2014   Tobacco abuse 05/07/2014   Nickel allergy 02/07/2014   History of total knee arthroplasty 02/07/2014   Chronic calcific pancreatitis (HCC) 09/29/2013   Pancreatic cyst 09/29/2013   Type 2 diabetes mellitus without complications (HCC) 09/29/2013   PCP:  Arloa Jarvis, NP Pharmacy:   [facility] [place], [place] - [address] [place] [address] Phone: [phone] Fax: [phone]  [facility] [address] [place] [address] Phone: [phone] Fax: [phone]  [id number] - [place], [place] - [address] AT [facility] [address] [place] [address] Phone: [phone] Fax: [phone]     Social Drivers of Health (SDOH) Social History: SDOH Screenings   Food Insecurity: Food Insecurity Present (07/02/2023)  Housing: High Risk (07/02/2023)  Transportation Needs: No Transportation Needs (07/02/2023)  Utilities: At Risk (07/02/2023)  Alcohol Screen: Low Risk  (12/13/2018)  Financial Resource Strain: Medium Risk (01/25/2023)  Social Connections: Socially Isolated (07/02/2023)  Tobacco Use: High Risk (07/02/2023)   SDOH Interventions:     Readmission Risk Interventions    05/22/2023    2:38 PM  Readmission Risk Prevention Plan  Transportation Screening Complete  HRI or Home Care Consult Complete  Social Work Consult for Recovery Care Planning/Counseling Complete  Palliative Care Screening Not Applicable  Medication Review Oceanographer) Complete

## 2023-07-05 ENCOUNTER — Ambulatory Visit: Payer: 59 | Admitting: Thoracic Surgery (Cardiothoracic Vascular Surgery)

## 2023-07-06 ENCOUNTER — Telehealth: Payer: Self-pay

## 2023-07-06 NOTE — Telephone Encounter (Signed)
 Received call from pt stating she was discharged from hospital and needs her INR checked; however she does not have transportation and prior to hospital admission [facility] was checking INR. Pt's address has changed, to [address], [place], [place] [address] (updated chart).   I called [facility] and spoke with [place]. Provided verbal order for Home Health to resume checking INR at home visits. They are scheduled to see pt tomorrow, 07/07/23. I made her aware our office is closed on Saturday and Sunday but we will call with Warfarin dosing instructions Monday morning.

## 2023-07-07 LAB — POCT INR: INR: 3 (ref 2.0–3.0)

## 2023-07-09 ENCOUNTER — Ambulatory Visit (INDEPENDENT_AMBULATORY_CARE_PROVIDER_SITE_OTHER): Payer: 59 | Admitting: Cardiology

## 2023-07-09 DIAGNOSIS — Z952 Presence of prosthetic heart valve: Secondary | ICD-10-CM

## 2023-07-09 DIAGNOSIS — Z7901 Long term (current) use of anticoagulants: Secondary | ICD-10-CM

## 2023-07-17 ENCOUNTER — Ambulatory Visit (HOSPITAL_COMMUNITY): Payer: 59

## 2023-07-18 ENCOUNTER — Telehealth: Payer: Self-pay

## 2023-07-18 ENCOUNTER — Other Ambulatory Visit: Payer: Self-pay | Admitting: Thoracic Surgery (Cardiothoracic Vascular Surgery)

## 2023-07-18 DIAGNOSIS — Z952 Presence of prosthetic heart valve: Secondary | ICD-10-CM

## 2023-07-18 NOTE — Telephone Encounter (Signed)
 Pt's INR checked by [facility]. I called Home Health to determine what time INR will be checked today since our office is closing at 12 noon d/t inclement weather. Spoke with Florentina Addison, RN who states she is now scheduled to go to see patient on Friday, 07/20/23. I provided Florentina Addison with my directed phone number [phone] to report INR results on Friday.

## 2023-07-19 ENCOUNTER — Other Ambulatory Visit (HOSPITAL_COMMUNITY): Payer: Self-pay

## 2023-07-19 ENCOUNTER — Ambulatory Visit: Payer: 59 | Admitting: Thoracic Surgery (Cardiothoracic Vascular Surgery)

## 2023-07-20 ENCOUNTER — Ambulatory Visit (INDEPENDENT_AMBULATORY_CARE_PROVIDER_SITE_OTHER): Payer: 59

## 2023-07-20 DIAGNOSIS — Z5181 Encounter for therapeutic drug level monitoring: Secondary | ICD-10-CM

## 2023-07-20 LAB — POCT INR: INR: 2.4 (ref 2.0–3.0)

## 2023-07-20 NOTE — Patient Instructions (Signed)
 Description   I spoke to [name], RN with  [facility] and patient and advised patient to take 2 tablets and then resume taking 1 tablet Daily, except 1.5 tablets on Mondays, Wednesdays and Fridays.   Recheck INR in 2 weeks   Coumadin clinic [phone]

## 2023-07-23 ENCOUNTER — Ambulatory Visit (INDEPENDENT_AMBULATORY_CARE_PROVIDER_SITE_OTHER): Payer: Self-pay

## 2023-07-23 DIAGNOSIS — Z5181 Encounter for therapeutic drug level monitoring: Secondary | ICD-10-CM

## 2023-07-23 LAB — POCT INR: INR: 2.2 (ref 2.0–3.0)

## 2023-07-23 NOTE — Patient Instructions (Signed)
 Description   I spoke to [place], RN with  [facility] and patient and advised patient to take 2 tablets and then resume taking 1 tablet Daily, except 1.5 tablets on Mondays, Wednesdays and Fridays.   Recheck INR in 1 week   Coumadin clinic [phone]

## 2023-07-25 ENCOUNTER — Other Ambulatory Visit: Payer: Self-pay | Admitting: Thoracic Surgery (Cardiothoracic Vascular Surgery)

## 2023-07-25 DIAGNOSIS — Z952 Presence of prosthetic heart valve: Secondary | ICD-10-CM

## 2023-07-25 NOTE — Progress Notes (Unsigned)
 [address]       [place] [address]             [phone]           Rose [name] [facility] Medical Record #[id number] Date of Birth: 12-26-1955  Rose Bathe, MD Cristino Martes, NP  Chief Complaint:  sp mvreplacement   History of Present Illness:     68 yo female sp MV replacement back in December. Has had issues with afib. Recently admitted with rapid afib and cardioverted. Has been home and feels like back in afib. Has been living in [name]. No housing available. Continues to smoke and feels that is causing her to cough. Now on chronic home O2. Feels like her "mesh" in her abdomen with fenestrations causing her leg to swell.  Doesn't feel like has follow up with Dr [name]       Past Medical History:  Diagnosis Date   Allergy    Anemia    Anxiety    Aortic atherosclerosis (HCC)    Arthritis    Asthma    Back pain    CAD (coronary artery disease)    Chronic headaches    Class 1 obesity due to excess calories with serious comorbidity in adult    Complication of anesthesia    slow to wake up   COPD (chronic obstructive pulmonary disease) (HCC)    Diabetes (HCC)    Fatty liver    GERD (gastroesophageal reflux disease)    Headache    Herniated lumbar intervertebral disc    High cholesterol    History of blood clots    Lumbago with sciatica, unspecified side    Mitral stenosis    severe by 05/18/21 echo   Moderate episode of recurrent major depressive disorder (HCC)    Nicotine dependence, cigarettes, with other nicotine-induced disorders    Nonrheumatic aortic (valve) insufficiency    Other chronic pain    Pain in joint involving ankle and foot    Pancreatitis    Pneumonia    Presence urogenital implant    Severe mitral regurgitation    Sleep apnea    no cpap   Stroke (HCC)    mild   SUI (stress urinary incontinence, female)    Tricuspid regurgitation    Type 2 diabetes mellitus with hyperglycemia, with long-term current use of  insulin (HCC)     Past Surgical History:  Procedure Laterality Date   ANKLE SURGERY     x 3   Bladder tack     BREAST REDUCTION SURGERY     CARDIOVERSION N/A 07/02/2023   Procedure: CARDIOVERSION;  Surgeon: Sande Rives, MD;  Location: [facility] INVASIVE CV LAB;  Service: Cardiovascular;  Laterality: N/A;   CHOLECYSTECTOMY  1998   HERNIA REPAIR     I & D EXTREMITY Right 10/28/2021   Procedure: IRRIGATION AND DEBRIDEMENT EXTREMITY, GREAT TOE;  Surgeon: Netta Cedars, MD;  Location: [facility] OR;  Service: Orthopedics;  Laterality: Right;   MITRAL VALVE REPLACEMENT N/A 05/11/2023   Procedure: MITRAL VALVE (MV) REPLACEMENT USING MOSAIC VALVE SIZE ;  Surgeon: Eugenio Hoes, MD;  Location: [facility] OR;  Service: Open Heart Surgery;  Laterality: N/A;   RIGHT/LEFT HEART CATH AND CORONARY ANGIOGRAPHY N/A 11/02/2022   Procedure: RIGHT/LEFT HEART CATH AND CORONARY ANGIOGRAPHY;  Surgeon: Tonny Bollman, MD;  Location: [facility] INVASIVE CV LAB;  Service: Cardiovascular;  Laterality: N/A;   TEE WITHOUT CARDIOVERSION N/A 11/02/2022   Procedure: TRANSESOPHAGEAL ECHOCARDIOGRAM;  Surgeon: Rose Bathe, MD;  Location: [facility] INVASIVE CV LAB;  Service: Cardiovascular;  Laterality: N/A;   TEE WITHOUT CARDIOVERSION N/A 05/11/2023   Procedure: TRANSESOPHAGEAL ECHOCARDIOGRAM;  Surgeon: Eugenio Hoes, MD;  Location: [facility] OR;  Service: Open Heart Surgery;  Laterality: N/A;   TRANSESOPHAGEAL ECHOCARDIOGRAM (CATH LAB) N/A 07/02/2023   Procedure: TRANSESOPHAGEAL ECHOCARDIOGRAM;  Surgeon: Sande Rives, MD;  Location: [facility] INVASIVE CV LAB;  Service: Cardiovascular;  Laterality: N/A;   VAGINAL HYSTERECTOMY  1988    Social History   Tobacco Use  Smoking Status Every Day   Current packs/day: 0.50   Average packs/day: 0.5 packs/day for 47.0 years (23.5 ttl pk-yrs)   Types: Cigarettes  Smokeless Tobacco Never  Tobacco Comments   0.5 packs smoked daily. ARJ 03/29/21   Patient states she is not ready to give up smoking.      Social History   Substance and Sexual Activity  Alcohol Use No   Alcohol/week: 0.0 standard drinks of alcohol    Social History   Socioeconomic History   Marital status: Single    Spouse name: Not on file   Number of children: Not on file   Years of education: Not on file   Highest education level: Not on file  Occupational History   Not on file  Tobacco Use   Smoking status: Every Day    Current packs/day: 0.50    Average packs/day: 0.5 packs/day for 47.0 years (23.5 ttl pk-yrs)    Types: Cigarettes   Smokeless tobacco: Never   Tobacco comments:    0.5 packs smoked daily. ARJ 03/29/21    Patient states she is not ready to give up smoking.   Vaping Use   Vaping status: Former   Substances: Nicotine, Flavoring  Substance and Sexual Activity   Alcohol use: No    Alcohol/week: 0.0 standard drinks of alcohol   Drug use: Not Currently    Types: Marijuana   Sexual activity: Not Currently  Other Topics Concern   Not on file  Social History Narrative   Lives in an apartment with her sister [name]. Patient states [name] is selling her Xanax and that she will not leave her medication at the house when she leaves. Patient carries her meds with her at all times.  [name] stated that she is in process of being evicted from her apartment d/t complaints against her for harassing other tenants. Patient states she is working with [employer] to find a place to live but they "will not help her". Patient stated she has court on Dec 17th for the eviction but she will not be there as her surgery is dec 13 so she hopes that she has a place to live when she gets discharged.           Patient also states she has to use [employer] to arrange transportation for her to get to all appointments.  Patient states that she uses [facility] but needs refills on her medications.           The patients main concern is her dog who lives with her and that she needs to have someone stay  with the dog at all times.     Social Drivers of Health   Financial Resource Strain: Medium Risk (01/25/2023)   Overall Financial Resource Strain (CARDIA)    Difficulty of Paying Living Expenses: Somewhat hard  Food Insecurity: Food Insecurity Present (07/02/2023)   Hunger Vital Sign    Worried  About Running Out of Food in the Last Year: Sometimes true    Ran Out of Food in the Last Year: Sometimes true  Transportation Needs: No Transportation Needs (07/02/2023)   PRAPARE - Administrator, Civil Service (Medical): No    Lack of Transportation (Non-Medical): No  Physical Activity: Not on file  Stress: Not on file  Social Connections: Socially Isolated (07/02/2023)   Social Connection and Isolation Panel [NHANES]    Frequency of Communication with Friends and Family: Never    Frequency of Social Gatherings with Friends and Family: Never    Attends Religious Services: Never    Database administrator or Organizations: No    Attends Banker Meetings: Never    Marital Status: Never married  Intimate Partner Violence: Not At Risk (07/02/2023)   Humiliation, Afraid, Rape, and Kick questionnaire    Fear of Current or Ex-Partner: No    Emotionally Abused: No    Physically Abused: No    Sexually Abused: No    Allergies  Allergen Reactions   Erythromycin Shortness Of Breath   Nickel Rash and Shortness Of Breath   Oxycodone Shortness Of Breath   Meperidine Other (See Comments)    Confusion and tingling  (noted in H&P)    Nicotine Hives and Itching   Codeine Swelling   Hydrocodone-Acetaminophen Itching    Tolerated Norco 11/2018   Morphine Itching   Propoxyphene Other (See Comments)    Ineffective    Quetiapine     Restless leg syndrome    Current Outpatient Medications  Medication Sig Dispense Refill   acetaminophen (TYLENOL) 500 MG tablet Take 500 mg by mouth every 6 (six) hours as needed for mild pain (pain score 1-3) or headache.     albuterol (VENTOLIN HFA)  108 (90 Base) MCG/ACT inhaler SMARTSIG:1 Puff(s) Via Inhaler Every 6 Hours PRN     amiodarone (PACERONE) 200 MG tablet Take 1 tablet (200 mg total) by mouth 2 (two) times daily for 2 weeks then 200mg  daily 60 tablet 1   clonazePAM (KLONOPIN) 1 MG tablet Take 1 mg by mouth 3 (three) times daily.     clotrimazole (MYCELEX) 10 MG troche SMARTSIG:1 Lozenge(s) By Mouth 5 Times Daily     enoxaparin (LOVENOX) 80 MG/0.8ML injection Inject 0.8 mLs (80 mg total) into the skin every 12 (twelve) hours for 4 days. 6.4 mL 0   Fluticasone-Umeclidin-Vilant (TRELEGY ELLIPTA) 200-62.5-25 MCG/ACT AEPB Inhale 200 mcg into the lungs daily as needed.     furosemide (LASIX) 20 MG tablet Take 1 tablet (20 mg total) by mouth as needed (for increased swelling or weight gain of 3 LB in 1 day or 5 LB in 1 week). 30 tablet 1   gabapentin (NEURONTIN) 800 MG tablet Take 0.5 tablets (400 mg total) by mouth 3 (three) times daily.     metFORMIN (GLUCOPHAGE) 500 MG tablet Take 500 mg by mouth 2 (two) times daily.     midodrine (PROAMATINE) 10 MG tablet Take 0.5 tablets (5 mg total) by mouth 2 (two) times daily.     omeprazole (PRILOSEC) 40 MG capsule Take 40 mg by mouth daily.     ondansetron (ZOFRAN) 4 MG tablet Take 4 mg by mouth every 8 (eight) hours as needed for nausea or vomiting.     OXYGEN Inhale 2 L into the lungs continuous.     Pancrelipase, Lip-Prot-Amyl, (CREON) 24000-76000 units CPEP Take 2 capsules by mouth 3 (three) times daily after meals.  rOPINIRole (REQUIP) 1 MG tablet Take 1 mg by mouth at bedtime.     rosuvastatin (CRESTOR) 40 MG tablet Take 1 tablet (40 mg total) by mouth at bedtime. 30 tablet 1   sertraline (ZOLOFT) 50 MG tablet Take 50 mg by mouth every morning.     traMADol (ULTRAM) 50 MG tablet Take 50-100 mg by mouth every 6 (six) hours as needed for moderate pain (pain score 4-6).     traZODone (DESYREL) 50 MG tablet Take 150 mg by mouth at bedtime.     warfarin (COUMADIN) 2.5 MG tablet Take 2  tablets (5 mg total) by mouth daily. Take 5 mg daily(2 tabs) till  INR check on Friday 2/7     No current facility-administered medications for this visit.     Family History  Problem Relation Age of Onset   Emphysema Mother    Heart disease Mother    Cancer Mother    Heart disease Father    Cancer Sister        Physical Exam: Using walker on oxygen Lungs: decreased Left base Card: IRR with no murmur Ext: left ankle edema Neuro: intact     Diagnostic Studies & Laboratory data: I have personally reviewed the following studies and agree with the findings   TTE (06/2023) IMPRESSIONS     1. Left ventricular ejection fraction, by estimation, is 50 to 55%. The  left ventricle has low normal function.   2. Right ventricular systolic function is normal. The right ventricular  size is normal.   3. Left atrial size was mild to moderately dilated. No left atrial/left  atrial appendage thrombus was detected. The LAA emptying velocity was 63  cm/s.   4. Normal bioprothestic MV. Normal function and no regurgitation.  Assessed by 3D echo. The mitral valve has been repaired/replaced. No  evidence of mitral valve regurgitation. The mean mitral valve gradient is  4.0 mmHg with average heart rate of 74 bpm.  There is a 29 mm bioprosthetic valve present in the mitral position.  Procedure Date: 05/11/2023. Echo findings are consistent with normal  structure and function of the mitral valve prosthesis.   5. Tricuspid valve regurgitation is moderate.   6. The aortic valve is tricuspid. There is mild calcification of the  aortic valve. Aortic valve regurgitation is trivial. Aortic valve  sclerosis is present, with no evidence of aortic valve stenosis.   7. There is Moderate (Grade III) layered plaque involving the descending  aorta and aortic arch.   Conclusion(s)/Recommendation(s): No LA/LAA thrombus identified. Successful  cardioversion performed with restoration of normal sinus  rhythm.   FINDINGS   Left Ventricle: Left ventricular ejection fraction, by estimation, is 50  to 55%. The left ventricle has low normal function. The left ventricular  internal cavity size was normal in size.   Right Ventricle: The right ventricular size is normal. No increase in  right ventricular wall thickness. Right ventricular systolic function is  normal.   Left Atrium: Left atrial size was mild to moderately dilated. No left  atrial/left atrial appendage thrombus was detected. The LAA emptying  velocity was 63 cm/s.   Right Atrium: Right atrial size was normal in size.   Pericardium: There is no evidence of pericardial effusion.   Mitral Valve: Normal bioprothestic MV. Normal function and no  regurgitation. Assessed by 3D echo. The mitral valve has been  repaired/replaced. No evidence of mitral valve regurgitation. There is a  29 mm bioprosthetic valve present in the mitral  position.   Procedure Date: 05/11/2023. Echo findings are consistent with normal  structure and function of the mitral valve prosthesis. MV peak gradient,  10.2 mmHg. The mean mitral valve gradient is 4.0 mmHg with average heart  rate of 74 bpm.   Tricuspid Valve: The tricuspid valve is grossly normal. Tricuspid valve  regurgitation is moderate . No evidence of tricuspid stenosis.   Aortic Valve: The aortic valve is tricuspid. There is mild calcification  of the aortic valve. Aortic valve regurgitation is trivial. Aortic valve  sclerosis is present, with no evidence of aortic valve stenosis.   Pulmonic Valve: The pulmonic valve was grossly normal. Pulmonic valve  regurgitation is not visualized. No evidence of pulmonic stenosis.   Aorta: The aortic root and ascending aorta are structurally normal, with  no evidence of dilitation. There is moderate (Grade III) layered plaque  involving the descending aorta and aortic arch.   Venous: The left lower pulmonary vein, left upper pulmonary vein, right   upper pulmonary vein and right lower pulmonary vein are normal.   IAS/Shunts: The atrial septum is grossly normal.   Additional Comments: Spectral Doppler performed.   LEFT VENTRICLE  PLAX 2D  LVOT diam:     1.80 cm  LV SV:         67  LV SV Index:   35  LVOT Area:     2.54 cm     AORTIC VALVE  LVOT Vmax:   124.00 cm/s  LVOT Vmean:  96.000 cm/s  LVOT VTI:    0.262 m    AORTA  Ao Root diam: 2.82 cm  Ao Asc diam:  3.05 cm   MITRAL VALVE  MV Area VTI:  1.75 cm   SHUNTS  MV Peak grad: 10.2 mmHg  Systemic VTI:  0.26 m  MV Mean grad: 4.0 mmHg   Systemic Diam: 1.80 cm  MV Vmax:      1.60 m/s  MV Vmean:     95.9 cm/s    Recent Radiology Findings:       Recent Lab Findings: Lab Results  Component Value Date   WBC 4.3 07/03/2023   HGB 8.7 (L) 07/03/2023   HCT 27.2 (L) 07/03/2023   PLT 184 07/03/2023   GLUCOSE 119 (H) 07/03/2023   CHOL 158 12/13/2018   TRIG 133 12/13/2018   HDL 51 12/13/2018   LDLCALC 80 12/13/2018   ALT 12 07/01/2023   AST 16 07/01/2023   NA 139 07/03/2023   K 3.2 (L) 07/03/2023   CL 100 07/03/2023   CREATININE 1.56 (H) 07/03/2023   BUN 26 (H) 07/03/2023   CO2 28 07/03/2023   TSH 1.510 06/30/2023   INR 2.2 07/23/2023   HGBA1C 6.3 (H) 06/30/2023      Assessment / Plan:     SP MVR. Pt appears to be back in afib and rate controlled at present. On Coumadin. I feel needs to reassessed for rhythm control and she is to discuss with Dr [name] and get a new appointment. Her incision well healed and may have some nonunion centrally on CT scan. Not infected. No intervention needed and with her nickel allergy no plating options. Symptom control moving forward   I have spent 30 min in review of the records, viewing studies and in face to face with patient and in coordination of future care    Eugenio Hoes 07/25/2023 6:37 PM

## 2023-07-26 ENCOUNTER — Encounter: Payer: Self-pay | Admitting: Thoracic Surgery (Cardiothoracic Vascular Surgery)

## 2023-07-26 ENCOUNTER — Ambulatory Visit (INDEPENDENT_AMBULATORY_CARE_PROVIDER_SITE_OTHER): Payer: Self-pay | Admitting: Thoracic Surgery (Cardiothoracic Vascular Surgery)

## 2023-07-26 VITALS — BP 124/82 | HR 84 | Resp 18 | Ht 63.0 in | Wt 196.0 lb

## 2023-07-26 DIAGNOSIS — Z952 Presence of prosthetic heart valve: Secondary | ICD-10-CM

## 2023-07-26 NOTE — Patient Instructions (Signed)
 Follow up prn

## 2023-07-27 ENCOUNTER — Telehealth: Payer: Self-pay | Admitting: Cardiology

## 2023-07-27 DIAGNOSIS — I4891 Unspecified atrial fibrillation: Secondary | ICD-10-CM

## 2023-07-27 MED ORDER — FUROSEMIDE 20 MG PO TABS: 40.0000 mg | ORAL_TABLET | Freq: Every day | ORAL | 3 refills | Status: DC

## 2023-07-27 MED ORDER — AMIODARONE HCL 200 MG PO TABS: 200.0000 mg | ORAL_TABLET | Freq: Two times a day (BID) | ORAL | 3 refills | Status: DC

## 2023-07-27 MED ORDER — FUROSEMIDE 20 MG PO TABS: 40.0000 mg | ORAL_TABLET | Freq: Every day | ORAL | 3 refills | Status: AC

## 2023-07-27 NOTE — Telephone Encounter (Signed)
 Called patient back. Patient complaining of left leg swollen, tightness, tingling, and burning from her foot to groin. Patient stated on the pain scale she is a 6.  Patient also stated she is in A. FIB, palpitations, and HR over 100. Patient reported right now HR 128. Patient stated it keeps going up and down. Patient recently had surgery with Dr. Lennox Grumbles, who she saw yesterday. Dr. Leafy Ro stated in his note -  History of Present Illness:     68 yo female sp MV replacement back in December. Has had issues with afib. Recently admitted with rapid afib and cardioverted. Has been home and feels like back in afib. Has been living in [place]. No housing available. Continues to smoke and feels that is causing her to cough. Now on chronic home O2. Feels like her "mesh" in her abdomen with fenestrations causing her leg to swell.  Doesn't feel like has follow up with Dr [name]       Informed patient that a message would be sent to Dr. [name] and his nurse for advisement.

## 2023-07-27 NOTE — Telephone Encounter (Signed)
 Pt c/o swelling/edema: STAT if pt has developed SOB within 24 hours  If swelling, where is the swelling located? Left leg Up into groin How much weight have you gained and in what time span? 10 pds in week per pt  Have you gained 2 pounds in a day or 5 pounds in a week? yes  Do you have a log of your daily weights (if so, list)? no  Are you currently taking a fluid pill?Told her she was not taking cause it was not working  Are you currently SOB? No, lungs where clear  Have you traveled recently in a car or plane for an extended period of time? no

## 2023-07-27 NOTE — Telephone Encounter (Signed)
 Jake Bathe, MD to Me  Sharin Grave, RN     07/27/23  4:47 PM Increase lasix to 40mg  once a day Continue amiodarone 200mg  twice a day AFIB clinic next week.  Donato Schultz, MD   Called patient back to let her know of advisement.

## 2023-07-27 NOTE — Telephone Encounter (Signed)
 Pt c/o swelling: STAT is pt has developed SOB within 24 hours  How much weight have you gained and in what time span? 10lbs in 4 days.  If swelling, where is the swelling located?  From toes up to her groin area on left leg only Are you currently taking a fluid pill? Yes, Lasix   Are you currently SOB? No  Do you have a log of your daily weights (if so, list)? 02/24 189lbs 02/27 199  Have you gained 3 pounds in a day or 5 pounds in a week? Yes, 10lbs in four days   Have you traveled recently? No  Patient c/o Palpitations:  High priority if patient c/o lightheadedness, shortness of breath, or chest pain  How long have you had palpitations/irregular HR/ Afib? Are you having the symptoms now? Yes  Are you currently experiencing lightheadedness, SOB or CP? Yes, chest pain  Do you have a history of afib (atrial fibrillation) or irregular heart rhythm? Yes Have you checked your BP or HR? (document readings if available): n/a  Are you experiencing any other symptoms? Coughing a lot and lightheadedness Patient stated she first noticed the swelling after her procedure on 05/11/23. Patient is very concerned. Please advise.

## 2023-07-27 NOTE — Telephone Encounter (Signed)
 Please see prior telephone note for further information.

## 2023-08-03 ENCOUNTER — Ambulatory Visit (HOSPITAL_COMMUNITY): Payer: 59

## 2023-08-03 ENCOUNTER — Telehealth (HOSPITAL_COMMUNITY): Payer: Self-pay | Admitting: Radiology

## 2023-08-03 ENCOUNTER — Other Ambulatory Visit: Payer: Self-pay

## 2023-08-03 ENCOUNTER — Encounter (HOSPITAL_COMMUNITY): Payer: Self-pay

## 2023-08-03 ENCOUNTER — Ambulatory Visit (INDEPENDENT_AMBULATORY_CARE_PROVIDER_SITE_OTHER): Payer: Self-pay

## 2023-08-03 DIAGNOSIS — Z5181 Encounter for therapeutic drug level monitoring: Secondary | ICD-10-CM | POA: Diagnosis not present

## 2023-08-03 DIAGNOSIS — I4891 Unspecified atrial fibrillation: Secondary | ICD-10-CM

## 2023-08-03 LAB — POCT INR: INR: 1.7 — AB (ref 2.0–3.0)

## 2023-08-03 MED ORDER — WARFARIN SODIUM 2.5 MG PO TABS: ORAL_TABLET | ORAL | 2 refills | Status: DC

## 2023-08-03 MED ORDER — WARFARIN SODIUM 2.5 MG PO TABS
ORAL_TABLET | ORAL | 2 refills | Status: DC
Start: 2023-08-03 — End: 2023-08-03

## 2023-08-03 NOTE — Patient Instructions (Signed)
 Description   I spoke to [place], RN with [facility] and patient and advised patient to take an extra 1/2 tablet today and then START taking 1.5 tablets daily, except 1 tablet on Tuesdays and Thursdays.  Recheck INR in 1 week   Coumadin clinic [phone]

## 2023-08-03 NOTE — Telephone Encounter (Signed)
 Pt states she needs Warfarin refill. [employer] and spoke with Rowdy. Provided verbal order for Warfarin 2.5mg  tablets and confirmed this will be delivered to pt on Monday.

## 2023-08-03 NOTE — Telephone Encounter (Signed)
 Patient arrived late for echo- Patient stated her ride showed up late. Told patient I would perform her study but she wanted to reschedule since it was less than 30 mins remaining in her appointment. Prior echo was completed as an inpatient on 07/01/23.

## 2023-08-06 ENCOUNTER — Telehealth (HOSPITAL_COMMUNITY): Payer: Self-pay | Admitting: Cardiology

## 2023-08-06 ENCOUNTER — Ambulatory Visit (HOSPITAL_COMMUNITY): Admitting: Physician Assistant

## 2023-08-06 NOTE — Telephone Encounter (Signed)
 Patient cancelled echocardiogram for the reason below:   08/03/2023 2:47 PM [id number], EDWINA V  Cancel Rsn: Patient (Patient left without being seen. Patient made decision to leave after being registered. She was suppose to reschedule but left without doing so. She arrived late for appointment.)   Order will be removed from the echo appointment  WQ and when she calls back we will reinstate the order.   Thank you

## 2023-08-10 ENCOUNTER — Ambulatory Visit (INDEPENDENT_AMBULATORY_CARE_PROVIDER_SITE_OTHER): Payer: Self-pay | Admitting: *Deleted

## 2023-08-10 DIAGNOSIS — Z7901 Long term (current) use of anticoagulants: Secondary | ICD-10-CM | POA: Diagnosis not present

## 2023-08-10 DIAGNOSIS — Z952 Presence of prosthetic heart valve: Secondary | ICD-10-CM

## 2023-08-10 LAB — POCT INR: INR: 1.9 — AB (ref 2.0–3.0)

## 2023-08-10 NOTE — Patient Instructions (Signed)
 Description   I spoke to [place], RN with [facility] and patient and advised patient to take an extra 1/2 tablet today (total 2 tablets) then START taking 1.5 tablets daily, except 1 tablet on Thursdays.  Recheck INR in 1 week.   Coumadin clinic [phone]

## 2023-08-11 NOTE — Progress Notes (Deleted)
 Cardiology Office Note    Date:  08/11/2023  ID:  Rose [name], DOB [birth year], MRN [id number] PCP:  Cristino Martes, NP  Cardiologist:  Donato Schultz, MD  Electrophysiologist:  None   Chief Complaint: ***  History of Present Illness: .    Rose [name] is a 68 y.o. 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  She was known to have severe MS and severe MR as well as TR though with challenging and delayed pre-operative course due to difficult housing/social situation with intermittent loss to follow-up. Pre-op cardiac cath 10/2022 showed mild nonobstructive plaquing in the RCA, lhemodynamic findings c/w severe MS/MR. She also is known to have a nickel allergy planned another sternal closure device for her. She eventually underwent MVR 04/2023 with 29mm Mosaic mitral valve. TR was mild-moderate so this was not surgically addressed. Post-hospital course notable for post-op volume overload and PAF. She also required home O2 and midodrine. She refused SNF at discharge. She was sent home on Coumadin. She was readmitted 06/2022 for acute on chronic HFpEF and atrial flutter with RVR. (This was noted during 05/2023 ED visit as well.)  She subsequently underwent TEE/DCCV and was discharged on Lovenox-Coumadin bridge. She was also discharged on amiodarone (200mg  BID x 2 weeks then 200mg  daily thereafter with plan for approximately 3 month duration of therapy). TEE had shown EF 50-55%, normal MVR, moderate TR, moderate aortic atherosclerosis.  Cmet, cbc, lipid, anemia panel, tsh SBE    Paroxysmal atrial fib/flutter H/o bioprosthetic MVR, residual moderate  TR Chronic HFpEF Mild CAD, aortic atherosclerosis, HLD Anemia  Labwork independently reviewed: 06/2023 K 3.2, Cr 1.56, Hgb 9.1, plt ok, alb 2.9, AP up, AST ALT OK, Mg TSH OK, trop neg 2020 LDL 80, trig 133  ROS: .    Please see the history of present illness. Otherwise, review of systems is positive for ***.  All other systems are reviewed and otherwise negative.  Studies Reviewed: Marland Kitchen    EKG:  EKG is ordered today, personally reviewed, demonstrating ***  CV Studies: Cardiac studies reviewed are outlined and summarized above. Otherwise please see EMR for full report.   Current Reported Medications:.    No outpatient medications have been marked as taking for the 08/13/23 encounter (Appointment) with Laurann Montana, PA-C.    Physical Exam:    VS:  There were no vitals taken for this visit.   Wt Readings from Last 3 Encounters:  07/26/23 196 lb (88.9 kg)  07/03/23 187 lb 8 oz (85 kg)  06/21/23 196 lb 15.7 oz (89.4 kg)    GEN: Well nourished, well developed in no acute distress NECK: No JVD; No carotid bruits CARDIAC: ***RRR, no murmurs, rubs, gallops RESPIRATORY:  Clear to auscultation without rales, wheezing or rhonchi  ABDOMEN: Soft, non-tender, non-distended EXTREMITIES:  No edema; No acute deformity   Asessement and Plan:.     ***  {The patient has an active order for outpatient cardiac rehabilitation.   Please indicate if the patient is ready to start. Do NOT delete this.  It will auto delete.  Refresh note, then sign.              Click here to document readiness and see contraindications.  :1}  Cardiac Rehabilitation Eligibility  Assessment      Disposition: F/u with ***  Signed, Laurann Montana, PA-C

## 2023-08-13 ENCOUNTER — Ambulatory Visit: Payer: 59 | Admitting: Physician Assistant

## 2023-08-13 DIAGNOSIS — I5032 Chronic diastolic (congestive) heart failure: Secondary | ICD-10-CM

## 2023-08-13 DIAGNOSIS — Z952 Presence of prosthetic heart valve: Secondary | ICD-10-CM

## 2023-08-13 DIAGNOSIS — I071 Rheumatic tricuspid insufficiency: Secondary | ICD-10-CM

## 2023-08-13 DIAGNOSIS — E785 Hyperlipidemia, unspecified: Secondary | ICD-10-CM

## 2023-08-13 DIAGNOSIS — D649 Anemia, unspecified: Secondary | ICD-10-CM

## 2023-08-13 DIAGNOSIS — I480 Paroxysmal atrial fibrillation: Secondary | ICD-10-CM

## 2023-08-13 DIAGNOSIS — I2510 Atherosclerotic heart disease of native coronary artery without angina pectoris: Secondary | ICD-10-CM

## 2023-08-16 ENCOUNTER — Ambulatory Visit (INDEPENDENT_AMBULATORY_CARE_PROVIDER_SITE_OTHER)

## 2023-08-16 DIAGNOSIS — Z5181 Encounter for therapeutic drug level monitoring: Secondary | ICD-10-CM | POA: Diagnosis not present

## 2023-08-16 LAB — POCT INR: INR: 2.9 (ref 2.0–3.0)

## 2023-08-16 NOTE — Patient Instructions (Signed)
 Description   I spoke to [place], Charity fundraiser with [facility]. Advised patient to contninue taking 1.5 tablets daily, except 1 tablet on Thursdays.  Recheck INR in 1 week.   Coumadin clinic [phone]

## 2023-08-17 ENCOUNTER — Other Ambulatory Visit: Payer: Self-pay

## 2023-08-17 ENCOUNTER — Other Ambulatory Visit: Payer: Self-pay | Admitting: Nurse Practitioner

## 2023-08-17 DIAGNOSIS — J9611 Chronic respiratory failure with hypoxia: Secondary | ICD-10-CM

## 2023-08-17 DIAGNOSIS — F17210 Nicotine dependence, cigarettes, uncomplicated: Secondary | ICD-10-CM

## 2023-08-17 DIAGNOSIS — Z1231 Encounter for screening mammogram for malignant neoplasm of breast: Secondary | ICD-10-CM

## 2023-08-17 DIAGNOSIS — J449 Chronic obstructive pulmonary disease, unspecified: Secondary | ICD-10-CM

## 2023-08-20 ENCOUNTER — Other Ambulatory Visit: Payer: Self-pay | Admitting: Family Medicine

## 2023-08-20 DIAGNOSIS — E2839 Other primary ovarian failure: Secondary | ICD-10-CM

## 2023-08-25 ENCOUNTER — Emergency Department (HOSPITAL_COMMUNITY)

## 2023-08-25 ENCOUNTER — Emergency Department (HOSPITAL_COMMUNITY)
Admission: EM | Admit: 2023-08-25 | Discharge: 2023-08-25 | Disposition: A | Discharge status: Home / Self Care | Attending: Emergency Medicine | Admitting: Emergency Medicine

## 2023-08-25 ENCOUNTER — Emergency Department (HOSPITAL_BASED_OUTPATIENT_CLINIC_OR_DEPARTMENT_OTHER)

## 2023-08-25 DIAGNOSIS — E119 Type 2 diabetes mellitus without complications: Secondary | ICD-10-CM | POA: Diagnosis not present

## 2023-08-25 DIAGNOSIS — F17210 Nicotine dependence, cigarettes, uncomplicated: Secondary | ICD-10-CM | POA: Insufficient documentation

## 2023-08-25 DIAGNOSIS — Z79899 Other long term (current) drug therapy: Secondary | ICD-10-CM | POA: Insufficient documentation

## 2023-08-25 DIAGNOSIS — J45909 Unspecified asthma, uncomplicated: Secondary | ICD-10-CM | POA: Insufficient documentation

## 2023-08-25 DIAGNOSIS — M7989 Other specified soft tissue disorders: Secondary | ICD-10-CM | POA: Insufficient documentation

## 2023-08-25 DIAGNOSIS — Z7984 Long term (current) use of oral hypoglycemic drugs: Secondary | ICD-10-CM | POA: Insufficient documentation

## 2023-08-25 DIAGNOSIS — I4891 Unspecified atrial fibrillation: Secondary | ICD-10-CM | POA: Insufficient documentation

## 2023-08-25 DIAGNOSIS — I5033 Acute on chronic diastolic (congestive) heart failure: Secondary | ICD-10-CM | POA: Diagnosis not present

## 2023-08-25 DIAGNOSIS — I2510 Atherosclerotic heart disease of native coronary artery without angina pectoris: Secondary | ICD-10-CM | POA: Diagnosis not present

## 2023-08-25 DIAGNOSIS — J449 Chronic obstructive pulmonary disease, unspecified: Secondary | ICD-10-CM | POA: Insufficient documentation

## 2023-08-25 LAB — COMPREHENSIVE METABOLIC PANEL WITH GFR
ALT: 21 U/L (ref 0–44)
AST: 23 U/L (ref 15–41)
Albumin: 3.4 g/dL — ABNORMAL LOW (ref 3.5–5.0)
Alkaline Phosphatase: 110 U/L (ref 38–126)
Anion gap: 10 (ref 5–15)
BUN: 10 mg/dL (ref 8–23)
CO2: 30 mmol/L (ref 22–32)
Calcium: 8.6 mg/dL — ABNORMAL LOW (ref 8.9–10.3)
Chloride: 98 mmol/L (ref 98–111)
Creatinine, Ser: 1.19 mg/dL — ABNORMAL HIGH (ref 0.44–1.00)
GFR, Estimated: 50 mL/min — ABNORMAL LOW (ref >60)
Glucose, Bld: 150 mg/dL — ABNORMAL HIGH (ref 70–99)
Potassium: 3.2 mmol/L — ABNORMAL LOW (ref 3.5–5.1)
Sodium: 138 mmol/L (ref 135–145)
Total Bilirubin: 0.5 mg/dL (ref 0.0–1.2)
Total Protein: 6.7 g/dL (ref 6.5–8.1)

## 2023-08-25 LAB — I-STAT VENOUS BLOOD GAS, ED
Acid-Base Excess: 9 mmol/L — ABNORMAL HIGH (ref 0.0–2.0)
Bicarbonate: 34 mmol/L — ABNORMAL HIGH (ref 20.0–28.0)
Calcium, Ion: 1.06 mmol/L — ABNORMAL LOW (ref 1.15–1.40)
HCT: 27 % — ABNORMAL LOW (ref 36.0–46.0)
Hemoglobin: 9.2 g/dL — ABNORMAL LOW (ref 12.0–15.0)
O2 Saturation: 98 %
Potassium: 3.2 mmol/L — ABNORMAL LOW (ref 3.5–5.1)
Sodium: 138 mmol/L (ref 135–145)
TCO2: 35 mmol/L — ABNORMAL HIGH (ref 22–32)
pCO2, Ven: 49.1 mmHg (ref 44–60)
pH, Ven: 7.448 — ABNORMAL HIGH (ref 7.25–7.43)
pO2, Ven: 106 mmHg — ABNORMAL HIGH (ref 32–45)

## 2023-08-25 LAB — CBC WITH DIFFERENTIAL/PLATELET
Abs Immature Granulocytes: 0.01 10*3/uL (ref 0.00–0.07)
Basophils Absolute: 0 10*3/uL (ref 0.0–0.1)
Basophils Relative: 1 %
Eosinophils Absolute: 0.1 10*3/uL (ref 0.0–0.5)
Eosinophils Relative: 3 %
HCT: 29.2 % — ABNORMAL LOW (ref 36.0–46.0)
Hemoglobin: 8.8 g/dL — ABNORMAL LOW (ref 12.0–15.0)
Immature Granulocytes: 0 %
Lymphocytes Relative: 14 %
Lymphs Abs: 0.6 10*3/uL — ABNORMAL LOW (ref 0.7–4.0)
MCH: 22.5 pg — ABNORMAL LOW (ref 26.0–34.0)
MCHC: 30.1 g/dL (ref 30.0–36.0)
MCV: 74.7 fL — ABNORMAL LOW (ref 80.0–100.0)
Monocytes Absolute: 0.3 10*3/uL (ref 0.1–1.0)
Monocytes Relative: 7 %
Neutro Abs: 3 10*3/uL (ref 1.7–7.7)
Neutrophils Relative %: 75 %
Platelets: 186 10*3/uL (ref 150–400)
RBC: 3.91 MIL/uL (ref 3.87–5.11)
RDW: 18.4 % — ABNORMAL HIGH (ref 11.5–15.5)
WBC: 4.1 10*3/uL (ref 4.0–10.5)
nRBC: 0 % (ref 0.0–0.2)

## 2023-08-25 LAB — TROPONIN I (HIGH SENSITIVITY)
Troponin I (High Sensitivity): 11 ng/L (ref <18)
Troponin I (High Sensitivity): 11 ng/L (ref <18)

## 2023-08-25 LAB — PROTIME-INR
INR: 1.9 — ABNORMAL HIGH (ref 0.8–1.2)
Prothrombin Time: 22 s — ABNORMAL HIGH (ref 11.4–15.2)

## 2023-08-25 LAB — BRAIN NATRIURETIC PEPTIDE: B Natriuretic Peptide: 153 pg/mL — ABNORMAL HIGH (ref 0.0–100.0)

## 2023-08-25 NOTE — ED Notes (Signed)
 Pt c/o burning and numbness in left thigh.

## 2023-08-25 NOTE — ED Triage Notes (Signed)
 EMS from hotel with c/o sob increasing for 2 weeks, worse today.  2.5 albuterol and 0.5 Atrovent for rhonchi bilateral and wheezing in uppers, given by EMS  98% 2 L [place] at home. 120/74,  bgl 231 22 Left hand   PMH Valve replacement  Mitral valve regurg  Pt on warfarin, and EMS EKG showed afib

## 2023-08-25 NOTE — ED Provider Notes (Signed)
 [place] EMERGENCY DEPARTMENT AT [facility] Provider Note  CSN: [id number] Arrival date & time: 08/25/23 1526  Chief Complaint(s) Shortness of Breath  HPI Rose [name] is a 68 y.o. female history of COPD, coronary artery disease, diabetes, prior stroke, presenting with shortness of breath.  Patient reports shortness of breath which is chronic, but worse over the past couple weeks.  She also reports some vague chest pain over this period, not exertional or pleuritic.  She is also concerned about leg swelling, she notes unilateral right leg swelling.  No trauma.  She reports she is on chronic warfarin and has been taking this as prescribed.  She also reports compliance with her Lasix.  No rashes.  No fevers or chills.  She reports chronic cough.  Occasionally has productive phlegm.   Past Medical History Past Medical History:  Diagnosis Date   Allergy    Anemia    Anxiety    Aortic atherosclerosis (HCC)    Arthritis    Asthma    Back pain    CAD (coronary artery disease)    Chronic headaches    Class 1 obesity due to excess calories with serious comorbidity in adult    Complication of anesthesia    slow to wake up   COPD (chronic obstructive pulmonary disease) (HCC)    Diabetes (HCC)    Fatty liver    GERD (gastroesophageal reflux disease)    Headache    Herniated lumbar intervertebral disc    High cholesterol    History of blood clots    Lumbago with sciatica, unspecified side    Mitral stenosis    severe by 05/18/21 echo   Moderate episode of recurrent major depressive disorder (HCC)    Nicotine dependence, cigarettes, with other nicotine-induced disorders    Nonrheumatic aortic (valve) insufficiency    Other chronic pain    Pain in joint involving ankle and foot    Pancreatitis    Pneumonia    Presence urogenital implant    Severe mitral regurgitation    Sleep apnea    no cpap   Stroke (HCC)    mild   SUI (stress urinary incontinence, female)     Tricuspid regurgitation    Type 2 diabetes mellitus with hyperglycemia, with long-term current use of insulin (HCC)    Patient Active Problem List   Diagnosis Date Noted   Typical atrial flutter (HCC) 07/01/2023   CHF (congestive heart failure) (HCC) 07/01/2023   Acute on chronic diastolic CHF (congestive heart failure) (HCC) 06/30/2023   COPD with acute exacerbation (HCC) 06/30/2023   Chronic respiratory failure with hypoxia (HCC) 06/30/2023   Chest pain 06/30/2023   Long term (current) use of anticoagulants 05/28/2023   Atrial fibrillation (HCC) 05/28/2023   S/P mitral valve replacement 05/11/2023   Endotracheally intubated 05/11/2023   Severe tricuspid regurgitation 05/11/2023   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   Anxiety 09/15/2020   Constipation 09/15/2020   DDD (degenerative disc disease), lumbar 09/15/2020   Elevated liver enzymes 09/15/2020   Fatty liver 09/15/2020   Mitral regurgitation 09/15/2020   Mobility impaired 09/15/2020   Multiple allergies 09/15/2020   Arthritis 09/15/2020   Risk for falls 09/15/2020   Shortness of breath 09/15/2020   Stroke (HCC) 09/15/2020   Ventral hernia 09/15/2020   Vitiligo 09/15/2020   Acute hypoxemic respiratory failure (HCC) 12/30/2018   Anemia of chronic disease 12/30/2018   Cough 12/30/2018   Homelessness 12/30/2018   Polysubstance abuse (HCC) 12/30/2018   Suicidal  ideation 12/30/2018   Severe recurrent major depression without psychotic features (HCC) 12/12/2018   Chronic hoarseness 02/26/2018   Parotid nodule 02/26/2018   Parotid sialolithiasis 02/26/2018   Generalized abdominal pain 05/11/2016   GERD (gastroesophageal reflux disease) 05/11/2016   Hematochezia 05/11/2016   History of colon polyps 05/11/2016   Nausea 05/11/2016   ADD (attention deficit disorder) 03/24/2016   Complaints of memory disturbance 03/24/2016   Depression 03/24/2016   Eczematous dermatitis of upper eyelids of both eyes 01/19/2016   Regular  astigmatism of both eyes 01/19/2016   Stress at home 11/16/2015   Deformity of metatarsal bone of left foot 04/16/2015   Pain in lower limb 03/30/2015   Sinus tarsitis of left foot 03/30/2015   Arthritis of ankle, left, degenerative 03/30/2015   COPD, mild 03/24/2015   Cigarette smoker 03/24/2015   Trochanteric bursitis of left hip 07/20/2014   Osteoarthritis of right subtalar joint 05/07/2014   Pain due to any device, implant or graft 05/07/2014   Post-traumatic osteoarthritis of right ankle 05/07/2014   Achilles tendinitis of left lower extremity 05/07/2014   Tobacco abuse 05/07/2014   Nickel allergy 02/07/2014   History of total knee arthroplasty 02/07/2014   Chronic calcific pancreatitis (HCC) 09/29/2013   Pancreatic cyst 09/29/2013   Type 2 diabetes mellitus without complications (HCC) 09/29/2013   Home Medication(s) Prior to Admission medications   Medication Sig Start Date End Date Taking? Authorizing Provider  acetaminophen (TYLENOL) 500 MG tablet Take 500 mg by mouth every 6 (six) hours as needed for mild pain (pain score 1-3) or headache. Patient not taking: Reported on 07/26/2023    [provider]  albuterol (VENTOLIN HFA) 108 (90 Base) MCG/ACT inhaler SMARTSIG:1 Puff(s) Via Inhaler Every 6 Hours PRN 06/13/23   [provider]  amiodarone (PACERONE) 200 MG tablet Take 1 tablet (200 mg total) by mouth 2 (two) times daily. 07/27/23   Jake Bathe, MD  clonazePAM (KLONOPIN) 1 MG tablet Take 1 mg by mouth 3 (three) times daily.    [provider]  clotrimazole (MYCELEX) 10 MG troche SMARTSIG:1 Lozenge(s) By Mouth 5 Times Daily 05/25/23   [provider]  Fluticasone-Umeclidin-Vilant (TRELEGY ELLIPTA) 200-62.5-25 MCG/ACT AEPB Inhale 200 mcg into the lungs daily as needed.    [provider]  furosemide (LASIX) 20 MG tablet Take 2 tablets (40 mg total) by mouth daily. 07/27/23   Jake Bathe, MD  gabapentin (NEURONTIN) 800 MG tablet  Take 0.5 tablets (400 mg total) by mouth 3 (three) times daily. 07/03/23   Zannie Cove, MD  metFORMIN (GLUCOPHAGE) 500 MG tablet Take 500 mg by mouth 2 (two) times daily. 03/05/19   [provider]  midodrine (PROAMATINE) 10 MG tablet Take 0.5 tablets (5 mg total) by mouth 2 (two) times daily. 07/03/23   Zannie Cove, MD  omeprazole (PRILOSEC) 40 MG capsule Take 40 mg by mouth daily. 01/23/22   [provider]  ondansetron (ZOFRAN) 4 MG tablet Take 4 mg by mouth every 8 (eight) hours as needed for nausea or vomiting.    [provider]  OXYGEN Inhale 2 L into the lungs continuous.    [provider]  Pancrelipase, Lip-Prot-Amyl, (CREON) 24000-76000 units CPEP Take 2 capsules by mouth 3 (three) times daily after meals.    [provider]  rOPINIRole (REQUIP) 1 MG tablet Take 1 mg by mouth at bedtime.    [provider]  rosuvastatin (CRESTOR) 40 MG tablet Take 1 tablet (40 mg total)  by mouth at bedtime. 05/22/23 08/20/23  Stehler, Oren Bracket, PA-C  sertraline (ZOLOFT) 50 MG tablet Take 50 mg by mouth every morning. 01/11/23   [provider]  traZODone (DESYREL) 50 MG tablet Take 150 mg by mouth at bedtime.    [provider]  warfarin (COUMADIN) 2.5 MG tablet Take 1 tablet to 1.5 tablets by mouth daily 08/03/23   Jake Bathe, MD                                                                                                                                    Past Surgical History Past Surgical History:  Procedure Laterality Date   ANKLE SURGERY     x 3   Bladder tack     BREAST REDUCTION SURGERY     CARDIOVERSION N/A 07/02/2023   Procedure: CARDIOVERSION;  Surgeon: Sande Rives, MD;  Location: [facility] INVASIVE CV LAB;  Service: Cardiovascular;  Laterality: N/A;   CHOLECYSTECTOMY  1998   HERNIA REPAIR     I & D EXTREMITY Right 10/28/2021   Procedure: IRRIGATION AND DEBRIDEMENT EXTREMITY, GREAT TOE;  Surgeon: Netta Cedars, MD;  Location: [facility] OR;  Service: Orthopedics;  Laterality: Right;   MITRAL VALVE REPLACEMENT N/A 05/11/2023   Procedure: MITRAL VALVE (MV) REPLACEMENT USING MOSAIC VALVE SIZE ;  Surgeon: Eugenio Hoes, MD;  Location: [facility] OR;  Service: Open Heart Surgery;  Laterality: N/A;   RIGHT/LEFT HEART CATH AND CORONARY ANGIOGRAPHY N/A 11/02/2022   Procedure: RIGHT/LEFT HEART CATH AND CORONARY ANGIOGRAPHY;  Surgeon: Tonny Bollman, MD;  Location: [facility] INVASIVE CV LAB;  Service: Cardiovascular;  Laterality: N/A;   TEE WITHOUT CARDIOVERSION N/A 11/02/2022   Procedure: TRANSESOPHAGEAL ECHOCARDIOGRAM;  Surgeon: Jake Bathe, MD;  Location: [facility] INVASIVE CV LAB;  Service: Cardiovascular;  Laterality: N/A;   TEE WITHOUT CARDIOVERSION N/A 05/11/2023   Procedure: TRANSESOPHAGEAL ECHOCARDIOGRAM;  Surgeon: Eugenio Hoes, MD;  Location: [facility] OR;  Service: Open Heart Surgery;  Laterality: N/A;   TRANSESOPHAGEAL ECHOCARDIOGRAM (CATH LAB) N/A 07/02/2023   Procedure: TRANSESOPHAGEAL ECHOCARDIOGRAM;  Surgeon: Sande Rives, MD;  Location: [facility] INVASIVE CV LAB;  Service: Cardiovascular;  Laterality: N/A;   VAGINAL HYSTERECTOMY  1988   Family History Family History  Problem Relation Age of Onset   Emphysema Mother    Heart disease Mother    Cancer Mother    Heart disease Father    Cancer Sister     Social History Social History   Tobacco Use   Smoking status: Every Day    Current packs/day: 0.50    Average packs/day: 0.5 packs/day for 47.0 years (23.5 ttl pk-yrs)    Types: Cigarettes   Smokeless tobacco: Never   Tobacco comments:    0.5 packs smoked daily. ARJ 03/29/21    Patient states she is not ready to give up smoking.   Vaping Use   Vaping status: Former   Substances: Nicotine, Flavoring  Substance Use Topics   Alcohol use: No    Alcohol/week: 0.0 standard drinks of alcohol   Drug use: Not Currently    Types: Marijuana   Allergies Erythromycin, Nickel, Oxycodone, Meperidine, Nicotine,  Codeine, Hydrocodone-acetaminophen, Morphine, Propoxyphene, and Quetiapine  Review of Systems Review of Systems  All other systems reviewed and are negative.   Physical Exam Vital Signs  I have reviewed the triage vital signs BP 91/81   Pulse 82   Resp 18   SpO2 100%  RR 20 Physical Exam Vitals and nursing note reviewed.  Constitutional:      General: She is not in acute distress.    Appearance: She is well-developed.  HENT:     Head: Normocephalic and atraumatic.     Mouth/Throat:     Mouth: Mucous membranes are moist.  Eyes:     Pupils: Pupils are equal, round, and reactive to light.  Cardiovascular:     Rate and Rhythm: Normal rate and regular rhythm.     Pulses:          Dorsalis pedis pulses are 2+ on the right side and 2+ on the left side.     Heart sounds: No murmur heard. Pulmonary:     Effort: Pulmonary effort is normal. No respiratory distress.     Breath sounds: Normal breath sounds.  Abdominal:     General: Abdomen is flat.     Palpations: Abdomen is soft.     Tenderness: There is no abdominal tenderness.  Musculoskeletal:        General: No tenderness.     Right lower leg: Edema (1+) present.     Left lower leg: No edema.  Skin:    General: Skin is warm and dry.  Neurological:     General: No focal deficit present.     Mental Status: She is alert. Mental status is at baseline.  Psychiatric:        Mood and Affect: Mood normal.        Behavior: Behavior normal.     ED Results and Treatments Labs (all labs ordered are listed, but only abnormal results are displayed) Labs Reviewed  COMPREHENSIVE METABOLIC PANEL WITH GFR - Abnormal; Notable for the following components:      Result Value   Potassium 3.2 (*)    Glucose, Bld 150 (*)    Creatinine, Ser 1.19 (*)    Calcium 8.6 (*)    Albumin 3.4 (*)    GFR, Estimated 50 (*)    All other components within normal limits  CBC WITH DIFFERENTIAL/PLATELET - Abnormal; Notable for the following  components:   Hemoglobin 8.8 (*)    HCT 29.2 (*)    MCV 74.7 (*)    MCH 22.5 (*)    RDW 18.4 (*)    Lymphs Abs 0.6 (*)    All other components within normal limits  BRAIN NATRIURETIC PEPTIDE - Abnormal; Notable for the following components:   B Natriuretic Peptide 153.0 (*)    All other components within normal limits  PROTIME-INR - Abnormal; Notable for the following components:   Prothrombin Time 22.0 (*)    INR 1.9 (*)    All other components within normal limits  I-STAT VENOUS BLOOD GAS, ED - Abnormal; Notable for the following components:   pH, Ven 7.448 (*)    pO2, Ven 106 (*)    Bicarbonate 34.0 (*)    TCO2 35 (*)    Acid-Base Excess 9.0 (*)  Potassium 3.2 (*)    Calcium, Ion 1.06 (*)    HCT 27.0 (*)    Hemoglobin 9.2 (*)    All other components within normal limits  TROPONIN I (HIGH SENSITIVITY)  TROPONIN I (HIGH SENSITIVITY)                                                                                                                          Radiology VAS [place] LOWER EXTREMITY VENOUS (DVT) (7a-7p) Result Date: 08/25/2023  Lower Venous DVT Study Patient Name:  Rose [name]  Date of Exam:   08/25/2023 Medical Rec #: [id number]       Accession #:    [id number] Date of Birth: Feb 22, 1956       Patient Gender: F Patient Age:   64 years Exam Location:  [facility] Procedure:      VAS [place] LOWER EXTREMITY VENOUS (DVT) Referring Phys: Alvino Blood --------------------------------------------------------------------------------  Indications: Swelling.  Comparison Study: No prior study on file Performing Technologist: Sherren Kerns RVS  Examination Guidelines: A complete evaluation includes B-mode imaging, spectral Doppler, color Doppler, and power Doppler as needed of all accessible portions of each vessel. Bilateral testing is considered an integral part of a complete examination. Limited examinations for reoccurring indications may be performed as noted. The reflux portion  of the exam is performed with the patient in reverse Trendelenburg.  +---------+---------------+---------+-----------+---------------+-------------+ RIGHT    CompressibilityPhasicitySpontaneityProperties     Thrombus                                                                 Aging         +---------+---------------+---------+-----------+---------------+-------------+ CFV      Full           Yes      Yes        Pulsatile                                                                waveform                     +---------+---------------+---------+-----------+---------------+-------------+ SFJ      Full                                                            +---------+---------------+---------+-----------+---------------+-------------+ FV Prox  Full                                                            +---------+---------------+---------+-----------+---------------+-------------+  FV Mid   Full                                                            +---------+---------------+---------+-----------+---------------+-------------+ FV DistalFull                                                            +---------+---------------+---------+-----------+---------------+-------------+ PFV      Full                                                            +---------+---------------+---------+-----------+---------------+-------------+ POP      Full           Yes      No         Pulsatile                                                                waveform                     +---------+---------------+---------+-----------+---------------+-------------+ PTV      Full                                                            +---------+---------------+---------+-----------+---------------+-------------+ PERO     Full                                                             +---------+---------------+---------+-----------+---------------+-------------+   +----+---------------+---------+-----------+------------------+--------------+ LEFTCompressibilityPhasicitySpontaneityProperties        Thrombus Aging +----+---------------+---------+-----------+------------------+--------------+ CFV Full           Yes      No         Pulsatile waveform               +----+---------------+---------+-----------+------------------+--------------+ SFJ Full                                                                +----+---------------+---------+-----------+------------------+--------------+    Summary: RIGHT: - There is no evidence of deep vein thrombosis in the lower extremity.  - No cystic structure found in the popliteal fossa. Pulsatile waveforms  suggestive of fluid overload. Intersitial edema noted throughout the extremity.  LEFT: - No evidence of common femoral vein obstruction.  Pulsatile waveforms suggestive of fluid overload.  *See table(s) above for measurements and observations.    Preliminary    DG Chest Port 1 View Result Date: 08/25/2023 CLINICAL DATA:  Chest pain. EXAM: PORTABLE CHEST 1 VIEW COMPARISON:  06/30/2023. FINDINGS: Low lung volume. Bilateral lung fields are clear. Bilateral costophrenic angles are clear. Stable cardio-mediastinal silhouette. No acute osseous abnormalities. The soft tissues are within normal limits. IMPRESSION: No active disease. Electronically Signed   By: Jules Schick M.D.   On: 08/25/2023 17:00    Pertinent labs & imaging results that were available during my care of the patient were reviewed by me and considered in my medical decision making (see MDM for details).  Medications Ordered in ED Medications - No data to display                                                                                                                                   Procedures Procedures  (including critical care time)  Medical  Decision Making / ED Course   MDM:  68 year old presenting to the emergency department with leg swelling.  Patient's primary complaint is that she is having swelling to the right lower extremity, not the left.  On exam she has 1+ pitting edema to the right lower extremity but not the left.  Distal circulation is intact.  No signs of cellulitis or infection.  She is on chronic warfarin therapy so lower concern for DVT but given signs of swelling well extremity will obtain ultrasound.  Doubt CHF exacerbation with unilateral swelling.  If ultrasound negative likely venous insufficiency and will recommend compression stockings.  Patient also reports vague chest pain.  EKG seems at baseline, shows A-fib.  Given chronic anticoagulation, doubt PE, no tachycardia, hypoxia on home oxygen.  Doubt ACS with vague symptoms, atypical symptoms, no EKG changes, will check troponin.  Will also check x-ray to evaluate for pneumothorax or pneumonia.  She also reports some mild shortness of breath associated with this, her lungs are clear on exam, no wheezing, no hypoxia.  Will check labs to evaluate for other cause such as acidosis or anemia.  Clinical Course as of 08/25/23 1912  Sat Aug 25, 2023  1907 Workup overall reassuring.  DVT ultrasound with no sign of DVT.  Patient has chronic anemia which is at baseline.  Troponin negative x 2.  Creatinine mildly elevated which seems about baseline.  No hypercapnia.  Chest x-ray clear.  Discussed results with patient.  Discussed compression stockings given right lower extremity swelling.  Patient request discharge.  She feels well.  Recommended close follow-up with primary care physician. Will discharge patient to home. All questions answered. Patient comfortable with plan of discharge. Return precautions discussed with patient and specified on the after visit summary.  [WS]  Clinical Course User Index [WS] Lonell Grandchild, MD     Additional history  obtained: -Additional history obtained from ems -External records from outside source obtained and reviewed including: Chart review including previous notes, labs, imaging, consultation notes including prior notes   Lab Tests: -I ordered, reviewed, and interpreted labs.   The pertinent results include:   Labs Reviewed  COMPREHENSIVE METABOLIC PANEL WITH GFR - Abnormal; Notable for the following components:      Result Value   Potassium 3.2 (*)    Glucose, Bld 150 (*)    Creatinine, Ser 1.19 (*)    Calcium 8.6 (*)    Albumin 3.4 (*)    GFR, Estimated 50 (*)    All other components within normal limits  CBC WITH DIFFERENTIAL/PLATELET - Abnormal; Notable for the following components:   Hemoglobin 8.8 (*)    HCT 29.2 (*)    MCV 74.7 (*)    MCH 22.5 (*)    RDW 18.4 (*)    Lymphs Abs 0.6 (*)    All other components within normal limits  BRAIN NATRIURETIC PEPTIDE - Abnormal; Notable for the following components:   B Natriuretic Peptide 153.0 (*)    All other components within normal limits  PROTIME-INR - Abnormal; Notable for the following components:   Prothrombin Time 22.0 (*)    INR 1.9 (*)    All other components within normal limits  I-STAT VENOUS BLOOD GAS, ED - Abnormal; Notable for the following components:   pH, Ven 7.448 (*)    pO2, Ven 106 (*)    Bicarbonate 34.0 (*)    TCO2 35 (*)    Acid-Base Excess 9.0 (*)    Potassium 3.2 (*)    Calcium, Ion 1.06 (*)    HCT 27.0 (*)    Hemoglobin 9.2 (*)    All other components within normal limits  TROPONIN I (HIGH SENSITIVITY)  TROPONIN I (HIGH SENSITIVITY)    Notable for see MDM  EKG   EKG Interpretation Date/Time:  Saturday August 25 2023 16:29:32 EDT Ventricular Rate:  88 PR Interval:    QRS Duration:  109 QT Interval:  480 QTC Calculation: 529 R Axis:   49  Text Interpretation: Atrial fibrillation Prolonged QT interval Artifact in lead(s) I II III aVR aVL aVF Confirmed by Alvino Blood ([id number]) on 08/25/2023  4:53:56 PM         Imaging Studies ordered: I ordered imaging studies including chest x-ray On my interpretation imaging demonstrates no acute process I independently visualized and interpreted imaging. I agree with the radiologist interpretation   Medicines ordered and prescription drug management: No orders of the defined types were placed in this encounter.   -I have reviewed the patients home medicines and have made adjustments as needed   Cardiac Monitoring: The patient was maintained on a cardiac monitor.  I personally viewed and interpreted the cardiac monitored which showed an underlying rhythm of: A-fib  Social Determinants of Health:  Diagnosis or treatment significantly limited by social determinants of health: homelessness   Reevaluation: After the interventions noted above, I reevaluated the patient and found that their symptoms have improved  Co morbidities that complicate the patient evaluation  Past Medical History:  Diagnosis Date   Allergy    Anemia    Anxiety    Aortic atherosclerosis (HCC)    Arthritis    Asthma    Back pain    CAD (coronary artery disease)    Chronic headaches  Class 1 obesity due to excess calories with serious comorbidity in adult    Complication of anesthesia    slow to wake up   COPD (chronic obstructive pulmonary disease) (HCC)    Diabetes (HCC)    Fatty liver    GERD (gastroesophageal reflux disease)    Headache    Herniated lumbar intervertebral disc    High cholesterol    History of blood clots    Lumbago with sciatica, unspecified side    Mitral stenosis    severe by 05/18/21 echo   Moderate episode of recurrent major depressive disorder (HCC)    Nicotine dependence, cigarettes, with other nicotine-induced disorders    Nonrheumatic aortic (valve) insufficiency    Other chronic pain    Pain in joint involving ankle and foot    Pancreatitis    Pneumonia    Presence urogenital implant    Severe mitral  regurgitation    Sleep apnea    no cpap   Stroke (HCC)    mild   SUI (stress urinary incontinence, female)    Tricuspid regurgitation    Type 2 diabetes mellitus with hyperglycemia, with long-term current use of insulin (HCC)       Dispostion: Disposition decision including need for hospitalization was considered, and patient discharged from emergency department.    Final Clinical Impression(s) / ED Diagnoses Final diagnoses:  Right leg swelling     This chart was dictated using voice recognition software.  Despite best efforts to proofread,  errors can occur which can change the documentation meaning.    Lonell Grandchild, MD 08/25/23 [phone]

## 2023-08-25 NOTE — ED Notes (Signed)
[facility] called for pt 

## 2023-08-25 NOTE — Discharge Instructions (Signed)
 We evaluated you for your leg swelling chest pain.  Your testing in the emergency department is reassuring.  Your ultrasound did not show any signs of a blood clot.  Your leg swelling is most likely due to weak veins (venous insufficiency).  Please buy compression stockings, this can help reduce fluid buildup.  Please also try to keep your legs elevated is much as possible.  Please follow-up with your primary doctor.  If you do develop any new or worsening symptoms such as severe pain, lightheadedness or dizziness, difficulty breathing, fainting, fevers or chills, or any other new symptoms, please return to the emergency department.

## 2023-08-25 NOTE — Progress Notes (Signed)
 VASCULAR LAB    Right lower extremity venous duplex has been performed.  See CV proc for preliminary results.  Relayed results to Dr. Suezanne Jacquet via secure chat  Sherren Kerns, RVT 08/25/2023, 5:53 PM

## 2023-08-27 ENCOUNTER — Ambulatory Visit (INDEPENDENT_AMBULATORY_CARE_PROVIDER_SITE_OTHER): Admitting: *Deleted

## 2023-08-27 DIAGNOSIS — Z952 Presence of prosthetic heart valve: Secondary | ICD-10-CM | POA: Diagnosis not present

## 2023-08-27 DIAGNOSIS — Z7901 Long term (current) use of anticoagulants: Secondary | ICD-10-CM | POA: Diagnosis not present

## 2023-08-27 DIAGNOSIS — I4891 Unspecified atrial fibrillation: Secondary | ICD-10-CM

## 2023-08-27 NOTE — Patient Instructions (Signed)
 Description   Spoke with pt and [facility] Nurse advised pt to take 2 tablets of warfarin today then continue taking 1.5 tablets daily, except 1 tablet on Thursdays.  Recheck INR in 1 week on Thursday with [facility].   Coumadin clinic [phone]

## 2023-08-30 ENCOUNTER — Telehealth: Payer: Self-pay | Admitting: Cardiology

## 2023-08-30 NOTE — Telephone Encounter (Signed)
 Spoke with pt and she stated that she has been having CP for the last couple of weeks but that she just started having left arm pain, SOB, and nausea. Advised pt that with all of these symptoms, she should go to ED to be evaluated. Pt asked if she had someone to drive her and she stated no, advised to call EMS. Pt verbalized understanding and agreed to plan.

## 2023-08-30 NOTE — Telephone Encounter (Signed)
   Pt c/o of Chest Pain: STAT if active CP, including tightness, pressure, jaw pain, radiating pain to shoulder/upper arm/back, CP unrelieved by Nitro. Symptoms reported of SOB, nausea, vomiting, sweating.  1. Are you having CP right now? No    2. Are you experiencing any other symptoms (ex. SOB, nausea, vomiting, sweating)? SOB, nausea, Left arm pain   3. Is your CP continuous or coming and going? Coming and going   4. Have you taken Nitroglycerin? No   5. How long have you been experiencing CP? 2 wks    6. If NO CP at time of call then end call with telling Pt to call back or call 911 if Chest pain returns prior to return call from triage team.

## 2023-09-03 ENCOUNTER — Telehealth: Payer: Self-pay | Admitting: Cardiology

## 2023-09-03 NOTE — Telephone Encounter (Signed)
 Calling to see when the patient next INR need to be check. Please advise

## 2023-09-03 NOTE — Telephone Encounter (Signed)
 Returned call and spoke with Home Health nurse. Home Health will recheck INR on Thursday, 09/06/23.

## 2023-09-04 ENCOUNTER — Other Ambulatory Visit: Payer: Self-pay | Admitting: Nurse Practitioner

## 2023-09-04 DIAGNOSIS — R928 Other abnormal and inconclusive findings on diagnostic imaging of breast: Secondary | ICD-10-CM

## 2023-09-07 ENCOUNTER — Telehealth: Payer: Self-pay

## 2023-09-07 NOTE — Telephone Encounter (Signed)
 I talked to [facility] and they could not reach patient to schedule INR visit 4/10.  They will attempt to go out there 4/14

## 2023-09-10 ENCOUNTER — Ambulatory Visit (INDEPENDENT_AMBULATORY_CARE_PROVIDER_SITE_OTHER): Admitting: Internal Medicine

## 2023-09-10 DIAGNOSIS — Z952 Presence of prosthetic heart valve: Secondary | ICD-10-CM | POA: Diagnosis not present

## 2023-09-10 DIAGNOSIS — Z7901 Long term (current) use of anticoagulants: Secondary | ICD-10-CM

## 2023-09-10 LAB — POCT INR: INR: 8 — AB (ref 2.0–3.0)

## 2023-09-17 ENCOUNTER — Other Ambulatory Visit: Payer: Self-pay | Admitting: Cardiology

## 2023-09-17 ENCOUNTER — Telehealth: Payer: Self-pay

## 2023-09-17 DIAGNOSIS — E1159 Type 2 diabetes mellitus with other circulatory complications: Secondary | ICD-10-CM

## 2023-09-17 DIAGNOSIS — E1169 Type 2 diabetes mellitus with other specified complication: Secondary | ICD-10-CM

## 2023-09-17 DIAGNOSIS — I700 Atherosclerosis of aorta: Secondary | ICD-10-CM

## 2023-09-17 DIAGNOSIS — I2510 Atherosclerotic heart disease of native coronary artery without angina pectoris: Secondary | ICD-10-CM

## 2023-09-17 LAB — POCT INR: INR: 3.9 — AB (ref 2.0–3.0)

## 2023-09-17 NOTE — Telephone Encounter (Signed)
 I spoke with [place], RN from [facility] who will be checking INR tonight after 5:00pm.  She will call with results and leave message and we can call her in the morning to discuss [phone]).

## 2023-09-18 ENCOUNTER — Ambulatory Visit (INDEPENDENT_AMBULATORY_CARE_PROVIDER_SITE_OTHER): Admitting: Cardiology

## 2023-09-18 DIAGNOSIS — Z952 Presence of prosthetic heart valve: Secondary | ICD-10-CM

## 2023-09-18 DIAGNOSIS — Z7901 Long term (current) use of anticoagulants: Secondary | ICD-10-CM

## 2023-09-20 ENCOUNTER — Other Ambulatory Visit (HOSPITAL_COMMUNITY): Payer: Self-pay

## 2023-09-20 MED ORDER — ROSUVASTATIN CALCIUM 40 MG PO TABS: 40.0000 mg | ORAL_TABLET | Freq: Every day | ORAL | 2 refills | Status: AC

## 2023-09-20 MED ORDER — ROSUVASTATIN CALCIUM 40 MG PO TABS: 40.0000 mg | ORAL_TABLET | Freq: Every day | ORAL | 2 refills | Status: DC

## 2023-09-20 NOTE — Telephone Encounter (Signed)
 Called in Phone order because it couldn't be e-prescribed.

## 2023-09-20 NOTE — Addendum Note (Signed)
 Addended by: Debrah Fan, Amandeep Hogston L on: 09/20/2023 08:40 AM   Modules accepted: Orders

## 2023-09-20 NOTE — Addendum Note (Signed)
 Addended by: Debrah Fan, Kaena Santori L on: 09/20/2023 08:17 AM   Modules accepted: Orders

## 2023-09-27 ENCOUNTER — Telehealth: Payer: Self-pay

## 2023-09-27 ENCOUNTER — Inpatient Hospital Stay: Admission: RE | Admit: 2023-09-27 | Source: Ambulatory Visit

## 2023-09-27 NOTE — Telephone Encounter (Signed)
 INR Due. Called Home Health RN  to determine when INR will be checked, no answer. Left message on voicemail.

## 2023-09-28 ENCOUNTER — Telehealth: Payer: Self-pay

## 2023-09-28 ENCOUNTER — Ambulatory Visit (INDEPENDENT_AMBULATORY_CARE_PROVIDER_SITE_OTHER)

## 2023-09-28 DIAGNOSIS — Z5181 Encounter for therapeutic drug level monitoring: Secondary | ICD-10-CM | POA: Diagnosis not present

## 2023-09-28 LAB — POCT INR: INR: 4.4 — AB (ref 2.0–3.0)

## 2023-09-28 NOTE — Telephone Encounter (Signed)
 Called and spoke with Angelia Kelp, RN at [facility]. Made her aware pt's INR was due yesterday, 09/27/23. Valinda Gault apologized and states the order was not documented correctly and was not performed at home health visit. She will send a nurse out today before 4:30pm to check INR and call results to our anticoagulation clinic. A apologized for their mistake. I provided Valinda Gault with our direct coumadin  clinic number.

## 2023-09-28 NOTE — Patient Instructions (Signed)
 Description   Spoke with pt and [facility] Nurse advised pt to HOLD today's dose and only take 1/2 tablet on Saturday and then START taking 1.5 tablets daily EXCEPT 1 tablet on Sunday, Tuesday, and Thursday.  Recheck INR in 1 week on Wednesday with [facility].   Coumadin  clinic [phone]

## 2023-10-03 ENCOUNTER — Other Ambulatory Visit: Payer: Self-pay | Admitting: Cardiology

## 2023-10-03 ENCOUNTER — Other Ambulatory Visit: Payer: Self-pay | Admitting: *Deleted

## 2023-10-03 ENCOUNTER — Ambulatory Visit (INDEPENDENT_AMBULATORY_CARE_PROVIDER_SITE_OTHER)

## 2023-10-03 DIAGNOSIS — I4891 Unspecified atrial fibrillation: Secondary | ICD-10-CM

## 2023-10-03 DIAGNOSIS — Z952 Presence of prosthetic heart valve: Secondary | ICD-10-CM

## 2023-10-03 DIAGNOSIS — Z5181 Encounter for therapeutic drug level monitoring: Secondary | ICD-10-CM

## 2023-10-03 LAB — POCT INR: INR: 2.7 (ref 2.0–3.0)

## 2023-10-03 MED ORDER — WARFARIN SODIUM 2.5 MG PO TABS: ORAL_TABLET | ORAL | 2 refills | Status: DC

## 2023-10-03 NOTE — Addendum Note (Signed)
 Addended by: Tyrus Gallus B on: 10/03/2023 05:06 PM   Modules accepted: Orders

## 2023-10-03 NOTE — Patient Instructions (Signed)
 Description   Spoke with pt and Gorden Latino, RN [facility] Nurse advised pt to continue taking 1.5 tablets daily EXCEPT 1 tablet on Sunday, Tuesday, and Thursday.  Recheck INR in 1 week on Wednesday with [facility].   Coumadin  clinic [phone]

## 2023-10-03 NOTE — Telephone Encounter (Signed)
 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

## 2023-10-03 NOTE — Telephone Encounter (Signed)
 Sent warfarin 2.5mg  refill three times and it returns with an e-scribe error. [facility] and spoke with Pharmacist and gave a verbal order for warfarin 2.5mg  tablet, quantity 40 with 2 refills, instructions take 1 to 1and 1/2 tablets by mouth daily as directed by Anticoagulation Clinic ordering Physician-Skains. She states will place on fill sent just filled an rx today.

## 2023-10-10 ENCOUNTER — Ambulatory Visit (INDEPENDENT_AMBULATORY_CARE_PROVIDER_SITE_OTHER): Admitting: *Deleted

## 2023-10-10 DIAGNOSIS — Z952 Presence of prosthetic heart valve: Secondary | ICD-10-CM | POA: Diagnosis not present

## 2023-10-10 DIAGNOSIS — Z7901 Long term (current) use of anticoagulants: Secondary | ICD-10-CM

## 2023-10-10 DIAGNOSIS — I4891 Unspecified atrial fibrillation: Secondary | ICD-10-CM

## 2023-10-10 LAB — POCT INR: INR: 6 — AB (ref 2.0–3.0)

## 2023-10-10 NOTE — Patient Instructions (Signed)
 Description   Spoke with Gorden Latino, RN [facility] Nurse advised pt to HOLD Warfarin tomorrow, Friday, and Saturday and then START taking 1  tablet daily EXCEPT 1.5 tablet on Monday, Wednesday, and Friday.  SEEK IMMEDIATE MEDICAL ATTENTION IF SIGNS OR SYMPTOMS OF BLEEDING OCCUR Recheck INR in 1 week on Tuesday, 10/16/23 with [facility].   Coumadin  clinic [phone]

## 2023-10-15 ENCOUNTER — Other Ambulatory Visit: Payer: Self-pay | Admitting: Nurse Practitioner

## 2023-10-15 ENCOUNTER — Encounter: Payer: Self-pay | Admitting: Nurse Practitioner

## 2023-10-15 DIAGNOSIS — E119 Type 2 diabetes mellitus without complications: Secondary | ICD-10-CM

## 2023-10-15 DIAGNOSIS — J449 Chronic obstructive pulmonary disease, unspecified: Secondary | ICD-10-CM

## 2023-10-15 DIAGNOSIS — I700 Atherosclerosis of aorta: Secondary | ICD-10-CM

## 2023-10-16 ENCOUNTER — Ambulatory Visit
Admission: RE | Admit: 2023-10-16 | Discharge: 2023-10-16 | Disposition: A | Discharge status: Home / Self Care | Source: Ambulatory Visit | Attending: Nurse Practitioner | Admitting: Nurse Practitioner

## 2023-10-16 ENCOUNTER — Ambulatory Visit (INDEPENDENT_AMBULATORY_CARE_PROVIDER_SITE_OTHER)

## 2023-10-16 DIAGNOSIS — J9611 Chronic respiratory failure with hypoxia: Secondary | ICD-10-CM

## 2023-10-16 DIAGNOSIS — Z7901 Long term (current) use of anticoagulants: Secondary | ICD-10-CM

## 2023-10-16 DIAGNOSIS — F17210 Nicotine dependence, cigarettes, uncomplicated: Secondary | ICD-10-CM

## 2023-10-16 DIAGNOSIS — I4891 Unspecified atrial fibrillation: Secondary | ICD-10-CM

## 2023-10-16 DIAGNOSIS — Z952 Presence of prosthetic heart valve: Secondary | ICD-10-CM

## 2023-10-16 DIAGNOSIS — J449 Chronic obstructive pulmonary disease, unspecified: Secondary | ICD-10-CM

## 2023-10-16 LAB — POCT INR: INR: 1.3 — AB (ref 2.0–3.0)

## 2023-10-16 NOTE — Patient Instructions (Signed)
 Description   Spoke with Gorden Latino, RN [facility] Nurse advised pt to take 1.5 tablets today and 2 tablets tomorrow then continue taking 1  tablet daily EXCEPT 1.5 tablet on Monday, Wednesday, and Friday.  SEEK IMMEDIATE MEDICAL ATTENTION IF SIGNS OR SYMPTOMS OF BLEEDING OCCUR Recheck INR in 1 week with [facility].   Coumadin  clinic [phone]

## 2023-10-24 ENCOUNTER — Ambulatory Visit (INDEPENDENT_AMBULATORY_CARE_PROVIDER_SITE_OTHER)

## 2023-10-24 DIAGNOSIS — Z5181 Encounter for therapeutic drug level monitoring: Secondary | ICD-10-CM

## 2023-10-24 LAB — POCT INR: INR: 2.1 (ref 2.0–3.0)

## 2023-10-24 NOTE — Patient Instructions (Signed)
 Description   Spoke with Rice Chamorro, RN [facility] Nurse and pt. Advised pt to take 2 tablets today and then continue taking 1  tablet daily EXCEPT 1.5 tablet on Monday, Wednesday, and Friday.  SEEK IMMEDIATE MEDICAL ATTENTION IF SIGNS OR SYMPTOMS OF BLEEDING OCCUR Recheck INR in 1 week with [facility].   Coumadin  clinic [phone]

## 2023-10-31 ENCOUNTER — Ambulatory Visit (INDEPENDENT_AMBULATORY_CARE_PROVIDER_SITE_OTHER): Payer: Self-pay | Admitting: *Deleted

## 2023-10-31 DIAGNOSIS — I4891 Unspecified atrial fibrillation: Secondary | ICD-10-CM | POA: Diagnosis not present

## 2023-10-31 DIAGNOSIS — Z952 Presence of prosthetic heart valve: Secondary | ICD-10-CM

## 2023-10-31 DIAGNOSIS — Z7901 Long term (current) use of anticoagulants: Secondary | ICD-10-CM | POA: Diagnosis not present

## 2023-10-31 LAB — POCT INR: INR: 3.3 — AB (ref 2.0–3.0)

## 2023-10-31 MED ORDER — WARFARIN SODIUM 2.5 MG PO TABS: ORAL_TABLET | ORAL | 2 refills | Status: DC

## 2023-11-07 ENCOUNTER — Ambulatory Visit (INDEPENDENT_AMBULATORY_CARE_PROVIDER_SITE_OTHER)

## 2023-11-07 ENCOUNTER — Ambulatory Visit
Admission: RE | Admit: 2023-11-07 | Discharge: 2023-11-07 | Disposition: A | Discharge status: Home / Self Care | Source: Ambulatory Visit | Attending: Nurse Practitioner | Admitting: Nurse Practitioner

## 2023-11-07 ENCOUNTER — Other Ambulatory Visit: Payer: Self-pay | Admitting: Nurse Practitioner

## 2023-11-07 DIAGNOSIS — J449 Chronic obstructive pulmonary disease, unspecified: Secondary | ICD-10-CM

## 2023-11-07 DIAGNOSIS — E119 Type 2 diabetes mellitus without complications: Secondary | ICD-10-CM

## 2023-11-07 DIAGNOSIS — I700 Atherosclerosis of aorta: Secondary | ICD-10-CM

## 2023-11-07 DIAGNOSIS — Z5181 Encounter for therapeutic drug level monitoring: Secondary | ICD-10-CM

## 2023-11-07 LAB — POCT INR: INR: 2.3 (ref 2.0–3.0)

## 2023-11-07 NOTE — Patient Instructions (Signed)
 Description   Spoke with Gorden Latino, RN [facility] Nurse and advised pt to take an extra 1/2 tablet today and then continue taking 1 tablet daily EXCEPT 1.5 tablets on Monday, Wednesday, and Friday.  Recheck INR in 1 week with [facility].   Coumadin  clinic [phone]

## 2023-11-08 ENCOUNTER — Other Ambulatory Visit: Payer: Self-pay | Admitting: Nurse Practitioner

## 2023-11-08 DIAGNOSIS — E1151 Type 2 diabetes mellitus with diabetic peripheral angiopathy without gangrene: Secondary | ICD-10-CM

## 2023-11-08 DIAGNOSIS — Z0389 Encounter for observation for other suspected diseases and conditions ruled out: Secondary | ICD-10-CM

## 2023-11-09 ENCOUNTER — Encounter (HOSPITAL_COMMUNITY): Payer: Self-pay | Admitting: *Deleted

## 2023-11-09 ENCOUNTER — Emergency Department (HOSPITAL_COMMUNITY)
Admission: EM | Admit: 2023-11-09 | Discharge: 2023-11-10 | Disposition: A | Discharge status: Home / Self Care | Attending: Emergency Medicine | Admitting: Emergency Medicine

## 2023-11-09 ENCOUNTER — Other Ambulatory Visit: Payer: Self-pay

## 2023-11-09 ENCOUNTER — Emergency Department (HOSPITAL_COMMUNITY)

## 2023-11-09 ENCOUNTER — Ambulatory Visit
Admission: RE | Admit: 2023-11-09 | Discharge: 2023-11-09 | Disposition: A | Discharge status: Home / Self Care | Source: Ambulatory Visit | Attending: Nurse Practitioner | Admitting: Nurse Practitioner

## 2023-11-09 DIAGNOSIS — R072 Precordial pain: Secondary | ICD-10-CM | POA: Diagnosis present

## 2023-11-09 DIAGNOSIS — L03115 Cellulitis of right lower limb: Secondary | ICD-10-CM | POA: Insufficient documentation

## 2023-11-09 DIAGNOSIS — R0602 Shortness of breath: Secondary | ICD-10-CM | POA: Diagnosis not present

## 2023-11-09 DIAGNOSIS — I4819 Other persistent atrial fibrillation: Secondary | ICD-10-CM | POA: Diagnosis not present

## 2023-11-09 DIAGNOSIS — F17200 Nicotine dependence, unspecified, uncomplicated: Secondary | ICD-10-CM | POA: Insufficient documentation

## 2023-11-09 DIAGNOSIS — R790 Abnormal level of blood mineral: Secondary | ICD-10-CM | POA: Insufficient documentation

## 2023-11-09 DIAGNOSIS — J449 Chronic obstructive pulmonary disease, unspecified: Secondary | ICD-10-CM | POA: Insufficient documentation

## 2023-11-09 DIAGNOSIS — Z7901 Long term (current) use of anticoagulants: Secondary | ICD-10-CM | POA: Insufficient documentation

## 2023-11-09 DIAGNOSIS — R079 Chest pain, unspecified: Secondary | ICD-10-CM

## 2023-11-09 DIAGNOSIS — E1151 Type 2 diabetes mellitus with diabetic peripheral angiopathy without gangrene: Secondary | ICD-10-CM

## 2023-11-09 DIAGNOSIS — Z7951 Long term (current) use of inhaled steroids: Secondary | ICD-10-CM | POA: Diagnosis not present

## 2023-11-09 DIAGNOSIS — Z0389 Encounter for observation for other suspected diseases and conditions ruled out: Secondary | ICD-10-CM

## 2023-11-09 DIAGNOSIS — Z79899 Other long term (current) drug therapy: Secondary | ICD-10-CM | POA: Insufficient documentation

## 2023-11-09 LAB — COMPREHENSIVE METABOLIC PANEL WITH GFR
ALT: 21 U/L (ref 0–44)
AST: 26 U/L (ref 15–41)
Albumin: 3.5 g/dL (ref 3.5–5.0)
Alkaline Phosphatase: 105 U/L (ref 38–126)
Anion gap: 14 (ref 5–15)
BUN: 11 mg/dL (ref 8–23)
CO2: 29 mmol/L (ref 22–32)
Calcium: 8.6 mg/dL — ABNORMAL LOW (ref 8.9–10.3)
Chloride: 96 mmol/L — ABNORMAL LOW (ref 98–111)
Creatinine, Ser: 1.38 mg/dL — ABNORMAL HIGH (ref 0.44–1.00)
GFR, Estimated: 42 mL/min — ABNORMAL LOW (ref >60)
Glucose, Bld: 111 mg/dL — ABNORMAL HIGH (ref 70–99)
Potassium: 3.4 mmol/L — ABNORMAL LOW (ref 3.5–5.1)
Sodium: 139 mmol/L (ref 135–145)
Total Bilirubin: 0.3 mg/dL (ref 0.0–1.2)
Total Protein: 6.7 g/dL (ref 6.5–8.1)

## 2023-11-09 LAB — CBC WITH DIFFERENTIAL/PLATELET
Abs Immature Granulocytes: 0.02 10*3/uL (ref 0.00–0.07)
Basophils Absolute: 0 10*3/uL (ref 0.0–0.1)
Basophils Relative: 1 %
Eosinophils Absolute: 0.1 10*3/uL (ref 0.0–0.5)
Eosinophils Relative: 2 %
HCT: 30.4 % — ABNORMAL LOW (ref 36.0–46.0)
Hemoglobin: 8.8 g/dL — ABNORMAL LOW (ref 12.0–15.0)
Immature Granulocytes: 0 %
Lymphocytes Relative: 13 %
Lymphs Abs: 0.7 10*3/uL (ref 0.7–4.0)
MCH: 21 pg — ABNORMAL LOW (ref 26.0–34.0)
MCHC: 28.9 g/dL — ABNORMAL LOW (ref 30.0–36.0)
MCV: 72.4 fL — ABNORMAL LOW (ref 80.0–100.0)
Monocytes Absolute: 0.3 10*3/uL (ref 0.1–1.0)
Monocytes Relative: 7 %
Neutro Abs: 3.9 10*3/uL (ref 1.7–7.7)
Neutrophils Relative %: 77 %
Platelets: 192 10*3/uL (ref 150–400)
RBC: 4.2 MIL/uL (ref 3.87–5.11)
RDW: 20 % — ABNORMAL HIGH (ref 11.5–15.5)
WBC: 5 10*3/uL (ref 4.0–10.5)
nRBC: 0 % (ref 0.0–0.2)

## 2023-11-09 LAB — BRAIN NATRIURETIC PEPTIDE: B Natriuretic Peptide: 138.6 pg/mL — ABNORMAL HIGH (ref 0.0–100.0)

## 2023-11-09 LAB — TROPONIN I (HIGH SENSITIVITY)
Troponin I (High Sensitivity): 11 ng/L (ref <18)
Troponin I (High Sensitivity): 12 ng/L (ref <18)

## 2023-11-09 MED ORDER — SODIUM CHLORIDE 0.9 % IV SOLN
1.0000 g | Freq: Once | INTRAVENOUS | Status: AC
  Administered 2023-11-09: 1 g via INTRAVENOUS
  Filled 2023-11-09: qty 10

## 2023-11-09 MED ORDER — CEPHALEXIN 500 MG PO CAPS: 500.0000 mg | ORAL_CAPSULE | Freq: Four times a day (QID) | ORAL | 0 refills | Status: DC

## 2023-11-09 NOTE — ED Triage Notes (Signed)
 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

## 2023-11-09 NOTE — ED Provider Triage Note (Signed)
 Emergency Medicine Provider Triage Evaluation Note  Rose [name] , a 68 y.o. female  was evaluated in triage.  Pt complains of CP, SOB, and leg swelling.  Patient reports chest pain and shortness of breath for the past several years but has been having new bilateral lower extremity pain and edema bilaterally.  Review of Systems  Positive: CP, SOB, bilateral leg swelling Negative: Fevers   Physical Exam  BP (!) 145/129 (BP Location: Left Arm)   Pulse (!) 118   Temp 98.4 F (36.9 C)   Resp (!) 30   Ht 5' 3 (1.6 m)   Wt 88.9 kg   SpO2 92%   BMI 34.72 kg/m  Gen:   Awake, no distress   Resp:  Normal effort, 2L nasal cannula at baseline MSK:   Moves extremities without difficulty  Other:    Medical Decision Making  Medically screening exam initiated at 6:50 PM.  Appropriate orders placed.  Rose [name] was informed that the remainder of the evaluation will be completed by another provider, this initial triage assessment does not replace that evaluation, and the importance of remaining in the ED until their evaluation is complete.    Sonnie Dusky, PA-C 11/09/23 1858

## 2023-11-09 NOTE — ED Provider Notes (Signed)
 [place] EMERGENCY DEPARTMENT AT [facility] Provider Note   CSN: [id number] Arrival date & time: 11/09/23  1704     Patient presents with: Chest Pain   Rose [name] is a 68 y.o. female.  She is here with complaint of intermittent chest pain worsening leg pain over the last few months.  She had a mitral valve replaced and since then she has been having troubles.  She followed up with CT surgery back in February.  She has department with cardiology next month. She also his having pain in her legs, right greater than left.  Increased swelling and redness.  She had an ultrasound today that did not show any evidence of a DVT.  She uses oxygen for COPD and continues to smoke.   The history is provided by the patient.  Chest Pain Pain location:  Substernal area Pain quality: aching   Pain severity:  Moderate Duration:  6 months Timing:  Intermittent Progression:  Unchanged Associated symptoms: back pain, cough, lower extremity edema and shortness of breath   Associated symptoms: no abdominal pain, no fever, no nausea and no vomiting   Risk factors: smoking        Prior to Admission medications   Medication Sig Start Date End Date Taking? Authorizing Provider  acetaminophen  (TYLENOL ) 500 MG tablet Take 500 mg by mouth every 6 (six) hours as needed for mild pain (pain score 1-3) or headache. Patient not taking: Reported on 07/26/2023    [provider]  albuterol  (VENTOLIN  HFA) 108 (90 Base) MCG/ACT inhaler SMARTSIG:1 Puff(s) Via Inhaler Every 6 Hours PRN 06/13/23   [provider]  amiodarone  (PACERONE ) 200 MG tablet Take 1 tablet (200 mg total) by mouth 2 (two) times daily. 07/27/23   Hugh Madura, MD  clonazePAM  (KLONOPIN ) 1 MG tablet Take 1 mg by mouth 3 (three) times daily.    [provider]  clotrimazole (MYCELEX) 10 MG troche SMARTSIG:1 Lozenge(s) By Mouth 5 Times Daily 05/25/23   [provider]  Fluticasone -Umeclidin-Vilant  (TRELEGY ELLIPTA) 200-62.5-25 MCG/ACT AEPB Inhale 200 mcg into the lungs daily as needed.    [provider]  furosemide  (LASIX ) 20 MG tablet Take 2 tablets (40 mg total) by mouth daily. 07/27/23   Hugh Madura, MD  gabapentin  (NEURONTIN ) 800 MG tablet Take 0.5 tablets (400 mg total) by mouth 3 (three) times daily. 07/03/23   Deforest Fast, MD  metFORMIN  (GLUCOPHAGE ) 500 MG tablet Take 500 mg by mouth 2 (two) times daily. 03/05/19   [provider]  midodrine  (PROAMATINE ) 10 MG tablet Take 0.5 tablets (5 mg total) by mouth 2 (two) times daily. 07/03/23   Deforest Fast, MD  omeprazole (PRILOSEC) 40 MG capsule Take 40 mg by mouth daily. 01/23/22   [provider]  ondansetron  (ZOFRAN ) 4 MG tablet Take 4 mg by mouth every 8 (eight) hours as needed for nausea or vomiting.    [provider]  OXYGEN Inhale 2 L into the lungs continuous.    [provider]  Pancrelipase , Lip-Prot-Amyl, (CREON ) 24000-76000 units CPEP Take 2 capsules by mouth 3 (three) times daily after meals.    [provider]  rOPINIRole  (REQUIP ) 1 MG tablet Take 1 mg by mouth at bedtime.    [provider]  rosuvastatin  (CRESTOR ) 40 MG tablet Take 1 tablet (40 mg total) by mouth at bedtime. 09/20/23   Hugh Madura, MD  rosuvastatin  (CRESTOR ) 40 MG tablet Take 1 tablet (40 mg total) by mouth  at bedtime. 09/20/23   Hugh Madura, MD  sertraline  (ZOLOFT ) 50 MG tablet Take 50 mg by mouth every morning. 01/11/23   [provider]  traZODone  (DESYREL ) 50 MG tablet Take 150 mg by mouth at bedtime.    [provider]  warfarin (COUMADIN ) 2.5 MG tablet TAKE 1 TO 1&1/2 TABLETS BY MOUTH EVERY DAY AS DIRECTED by Coumadin  Clinic 10/31/23   Hugh Madura, MD    Allergies: Erythromycin, Nickel, Oxycodone , Meperidine, Nicotine , Codeine, Hydrocodone -acetaminophen , Morphine, Propoxyphene, and Quetiapine    Review of Systems  Constitutional:  Negative for fever.  HENT:   Negative for sore throat.   Respiratory:  Positive for cough and shortness of breath.   Cardiovascular:  Positive for chest pain and leg swelling.  Gastrointestinal:  Negative for abdominal pain, nausea and vomiting.  Genitourinary:  Negative for dysuria.  Musculoskeletal:  Positive for back pain.  Skin:  Positive for rash.    Updated Vital Signs BP (!) 145/129 (BP Location: Left Arm)   Pulse (!) 118   Temp 98.4 F (36.9 C)   Resp (!) 30   Ht 5' 3 (1.6 m)   Wt 88.9 kg   SpO2 92%   BMI 34.72 kg/m   Physical Exam Vitals and nursing note reviewed.  Constitutional:      General: She is not in acute distress.    Appearance: Normal appearance. She is well-developed.  HENT:     Head: Normocephalic and atraumatic.   Eyes:     Conjunctiva/sclera: Conjunctivae normal.    Cardiovascular:     Rate and Rhythm: Normal rate and regular rhythm.     Heart sounds: No murmur heard. Pulmonary:     Effort: Pulmonary effort is normal. No respiratory distress.     Breath sounds: Normal breath sounds. No stridor. No wheezing.  Abdominal:     Palpations: Abdomen is soft.     Tenderness: There is no abdominal tenderness. There is no guarding or rebound.   Musculoskeletal:        General: No deformity. Normal range of motion.     Cervical back: Neck supple.     Right lower leg: Tenderness present. Edema present.     Left lower leg: Tenderness present. Edema present.   Skin:    General: Skin is warm and dry.   Neurological:     General: No focal deficit present.     Mental Status: She is alert.     GCS: GCS eye subscore is 4. GCS verbal subscore is 5. GCS motor subscore is 6.     (all labs ordered are listed, but only abnormal results are displayed) Labs Reviewed  COMPREHENSIVE METABOLIC PANEL WITH GFR - Abnormal; Notable for the following components:      Result Value   Potassium 3.4 (*)    Chloride 96 (*)    Glucose, Bld 111 (*)    Creatinine, Ser 1.38 (*)    Calcium  8.6 (*)     GFR, Estimated 42 (*)    All other components within normal limits  CBC WITH DIFFERENTIAL/PLATELET - Abnormal; Notable for the following components:   Hemoglobin 8.8 (*)    HCT 30.4 (*)    MCV 72.4 (*)    MCH 21.0 (*)    MCHC 28.9 (*)    RDW 20.0 (*)    All other components within normal limits  BRAIN NATRIURETIC PEPTIDE - Abnormal; Notable for the following components:   B Natriuretic Peptide 138.6 (*)  All other components within normal limits  LIPASE, BLOOD - Abnormal; Notable for the following components:   Lipase 68 (*)    All other components within normal limits  PROTIME-INR - Abnormal; Notable for the following components:   Prothrombin Time 23.3 (*)    INR 2.0 (*)    All other components within normal limits  TROPONIN I (HIGH SENSITIVITY)  TROPONIN I (HIGH SENSITIVITY)    EKG: EKG Interpretation Date/Time:  Friday November 09 2023 17:56:14 EDT Ventricular Rate:  99 PR Interval:    QRS Duration:  92 QT Interval:  372 QTC Calculation: 477 R Axis:   46  Text Interpretation: Atrial flutter with variable A-V block T wave abnormality, consider lateral ischemia Prolonged QT Abnormal ECG When compared with ECG of 25-Aug-2023 16:29, No significant change since last tracing Confirmed by Racheal Buddle [phone]) on 11/09/2023 10:50:48 PM  Radiology: Lenell Query Chest Portable 1 View Result Date: 11/09/2023 CLINICAL DATA:  Chest pain. EXAM: PORTABLE CHEST 1 VIEW COMPARISON:  August 25, 2023 FINDINGS: The cardiac silhouette is mildly enlarged and unchanged in size. Marked severity calcification of the aortic arch is seen. Low lung volumes are noted. There is no evidence of acute infiltrate, pleural effusion or pneumothorax. The visualized skeletal structures are unremarkable. IMPRESSION: Low lung volumes without evidence of acute or active cardiopulmonary disease. Electronically Signed   By: Virgle Grime M.D.   On: 11/09/2023 19:32   US  Venous Img Lower Bilateral (DVT) Result Date:  11/09/2023 CLINICAL DATA:  Lower extremity redness and swelling EXAM: BILATERAL LOWER EXTREMITY VENOUS DOPPLER ULTRASOUND TECHNIQUE: Gray-scale sonography with graded compression, as well as color Doppler and duplex ultrasound were performed to evaluate the lower extremity deep venous systems from the level of the common femoral vein and including the common femoral, femoral, profunda femoral, popliteal and calf veins including the posterior tibial, peroneal and gastrocnemius veins when visible. The superficial great saphenous vein was also interrogated. Spectral Doppler was utilized to evaluate flow at rest and with distal augmentation maneuvers in the common femoral, femoral and popliteal veins. COMPARISON:  None Available. FINDINGS: RIGHT LOWER EXTREMITY Common Femoral Vein: No evidence of thrombus. Normal compressibility, respiratory phasicity and response to augmentation. Saphenofemoral Junction: No evidence of thrombus. Normal compressibility and flow on color Doppler imaging. Profunda Femoral Vein: No evidence of thrombus. Normal compressibility and flow on color Doppler imaging. Femoral Vein: No evidence of thrombus. Normal compressibility, respiratory phasicity and response to augmentation. Popliteal Vein: No evidence of thrombus. Normal compressibility, respiratory phasicity and response to augmentation. Calf Veins: Limited visualization of the calf veins. Posterior tibial veins appear patent. Peroneal vein not visualized. Superficial Great Saphenous Vein: No evidence of thrombus. Normal compressibility. LEFT LOWER EXTREMITY Common Femoral Vein: No evidence of thrombus. Normal compressibility, respiratory phasicity and response to augmentation. Saphenofemoral Junction: No evidence of thrombus. Normal compressibility and flow on color Doppler imaging. Profunda Femoral Vein: No evidence of thrombus. Normal compressibility and flow on color Doppler imaging. Femoral Vein: No evidence of thrombus. Normal  compressibility, respiratory phasicity and response to augmentation. Popliteal Vein: No evidence of thrombus. Normal compressibility, respiratory phasicity and response to augmentation. Calf Veins: Limited assessment of the calf veins. Again, posterior tibial vein appears patent. Peroneal vein not visualized. Superficial Great Saphenous Vein: No evidence of thrombus. Normal compressibility. IMPRESSION: No evidence of significant deep venous thrombosis in either lower extremity. Limited assessment of the calf veins. Electronically Signed   By: Melven Stable.  Shick M.D.   On: 11/09/2023 15:42  Procedures   Medications Ordered in the ED - No data to display                                  Medical Decision Making Amount and/or Complexity of Data Reviewed Labs: ordered.  Risk Prescription drug management.   This patient complains of chest pain leg pain swelling; this involves an extensive number of treatment Options and is a complaint that carries with it a high risk of complications and morbidity. The differential includes ACS, CHF, valvular heart disease, DVT, cellulitis, peripheral vascular disease  I ordered, reviewed and interpreted labs, which included CBC with stable low hemoglobin, chemistries with chronic CKD, troponins flat, INR therapeutic, BNP mildly elevated consistent with priors I ordered medication IV antibiotics and reviewed PMP when indicated. I ordered imaging studies which included chest x-ray and I independently    visualized and interpreted imaging which showed no acute findings Previous records obtained and reviewed in epic including recent cardiothoracic surgery notes Cardiac monitoring reviewed, A-fib with controlled rate Social determinants considered, multiple barriers including food housing electrical insecurity, social isolation, tobacco use Critical Interventions: None  After the interventions stated above, I reevaluated the patient and found patient to be satting  well on her home oxygen in no distress Admission and further testing considered, no indications for admission.  Will cover with antibiotics for possible cellulitis.  She is comfortable plan for discharge and outpatient follow-up with her treatment team.  Will put in a cardiology referral to see if they can get her in sooner.      Final diagnoses:  Nonspecific chest pain  Cellulitis of right lower leg  Persistent atrial fibrillation [facility])    ED Discharge Orders          Ordered    Ambulatory referral to Cardiology       Comments: If you have not heard from the Cardiology office within the next 72 hours please call [phone].   11/09/23 2251    cephALEXin (KEFLEX) 500 MG capsule  4 times daily        11/09/23 2317               Tonya Fredrickson, MD 11/10/23 [phone]

## 2023-11-10 ENCOUNTER — Encounter (HOSPITAL_COMMUNITY): Payer: Self-pay

## 2023-11-10 DIAGNOSIS — I4819 Other persistent atrial fibrillation: Secondary | ICD-10-CM | POA: Diagnosis not present

## 2023-11-10 LAB — PROTIME-INR
INR: 2 — ABNORMAL HIGH (ref 0.8–1.2)
Prothrombin Time: 23.3 s — ABNORMAL HIGH (ref 11.4–15.2)

## 2023-11-10 LAB — LIPASE, BLOOD: Lipase: 68 U/L — ABNORMAL HIGH (ref 11–51)

## 2023-11-10 NOTE — ED Notes (Signed)
 PT has no key to get home PT moved to green zone pending DC when daughter arrives

## 2023-11-10 NOTE — ED Notes (Signed)
 Left voice mail for pts daughter regarding d/c, advised to call back to discuss pt transportation/ letting pt into home

## 2023-11-10 NOTE — ED Notes (Signed)
 PT given her written medications per DC order and placed the paper in her purse.

## 2023-11-10 NOTE — ED Notes (Signed)
 This RN reviewed discharge instructions with patient. She verbalized understanding and denied any further questions. Explained risks and benefits of not wearing continous o2. PT well appearing upon discharge and reports no pain. Pt was wheeled out in wheelchair to daughter to be picked up . Pt ambulated to daughters car.

## 2023-11-14 ENCOUNTER — Ambulatory Visit (INDEPENDENT_AMBULATORY_CARE_PROVIDER_SITE_OTHER): Admitting: *Deleted

## 2023-11-14 DIAGNOSIS — I4891 Unspecified atrial fibrillation: Secondary | ICD-10-CM

## 2023-11-14 DIAGNOSIS — Z952 Presence of prosthetic heart valve: Secondary | ICD-10-CM

## 2023-11-14 DIAGNOSIS — Z7901 Long term (current) use of anticoagulants: Secondary | ICD-10-CM | POA: Diagnosis not present

## 2023-11-14 LAB — POCT INR: INR: 2.1 (ref 2.0–3.0)

## 2023-11-14 NOTE — Patient Instructions (Signed)
 Description   Spoke with Gorden Latino, RN [facility] Nurse and advised pt to take an extra 1/2 tablet today and then START taking 1.5 tablet daily EXCEPT 1 tablet on Tuesday, Thursday, and Saturday.  Recheck INR in 1 week with [facility]-LAST VISIT 11/20/23.   Coumadin  clinic [phone]

## 2023-11-19 ENCOUNTER — Ambulatory Visit (INDEPENDENT_AMBULATORY_CARE_PROVIDER_SITE_OTHER): Admitting: Internal Medicine

## 2023-11-19 DIAGNOSIS — Z952 Presence of prosthetic heart valve: Secondary | ICD-10-CM | POA: Diagnosis not present

## 2023-11-19 DIAGNOSIS — Z7901 Long term (current) use of anticoagulants: Secondary | ICD-10-CM

## 2023-11-19 LAB — POCT INR: INR: 1.9 — AB (ref 2.0–3.0)

## 2023-11-19 NOTE — Progress Notes (Signed)
Please see anticoagulation encounter.

## 2023-11-27 NOTE — Progress Notes (Signed)
Please see anticoagulation encounter.

## 2023-11-29 ENCOUNTER — Telehealth: Payer: Self-pay | Admitting: *Deleted

## 2023-11-29 NOTE — Telephone Encounter (Signed)
 Daphne Lesches NP called back. She was not aware patient was coming there for an INR check. Daphne says patient has a history of noncompliance and she does not think she can manage patient's Coumadin . Daphne will have her RN contact Ms Okelley to find out the situation and call us  back.

## 2023-11-29 NOTE — Telephone Encounter (Signed)
 Pt on the coumadin  clinic's follow up list. Called and spoke to pt who stated that she is going to her PCP to have INR checked on 7/8 at 3 pm. She stated it is easier for her to go to her PCP to have INR checked due to transportation.   Called and spoke to [facility] (office of Daphne Lesches, pt's PCP) who confirmed that pt has an appointment on 7/8. They stated they would have to call back to confirm if they were going to start managing pt's warfarin.   Gave them coumadin  clinic phone number [phone].

## 2023-12-05 NOTE — Telephone Encounter (Signed)
 Received call from Daphne Lesches, NP at [facility] stating pt did not show up to her appt for INR to be checked. Brandi called and spoke with pt multiple to make pt aware of the need to check INR and have Warfarin managed by a healthcare provider and also made her aware of risk if she does not have this managed appropriately. I also attempted to call pt to discuss overdue INR and the need to have INR checked either with Daphne Lesches, NP or with our Coumadin  Clinic. Pt did not answer, left detailed message on voicemail with call back number.

## 2023-12-11 IMAGING — CT CT ANGIO CHEST
2 of 7 series · 17 of 46 positions shown · IV contrast (omnipaque)
Comparison: CT chest 12/04/2020, CT angiography 12/13/2018

CLINICAL DATA: Pulmonary embolism (PE) suspected, high prob.
Reports to the ER for chest pain. Patient reports she has a "leaky
heart valve, an aortic aneurysm and a bad heart"

EXAM:
CT ANGIOGRAPHY CHEST WITH CONTRAST
TECHNIQUE: Multidetector CT imaging of the chest was performed using the
standard protocol during bolus administration of intravenous
contrast. Multiplanar CT image reconstructions and MIPs were
obtained to evaluate the vascular anatomy.
CONTRAST:  85mL OMNIPAQUE IOHEXOL 350 MG/ML SOLN

[Series 6: pe axial thins · axial · 0.80mm/px · z∈[+1064,+1328]mm · 14 of 306 slices shown]
[im 21/306  lung]
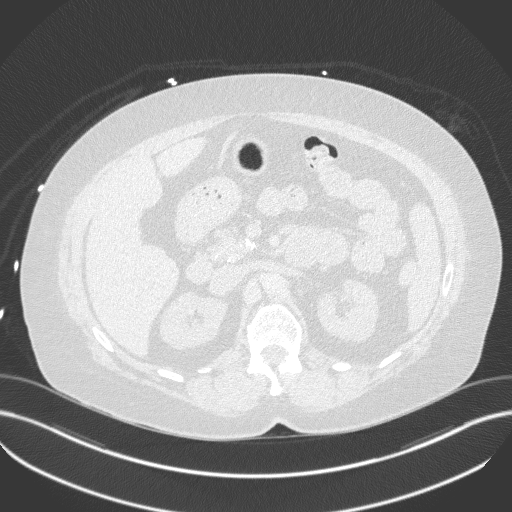
[im 41/306  soft-tissue]
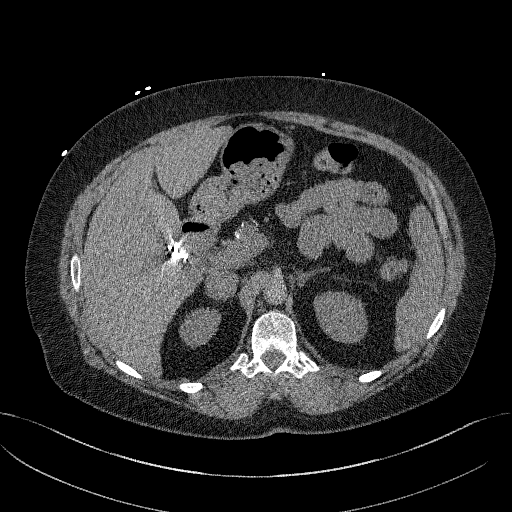
[im 62/306  lung]
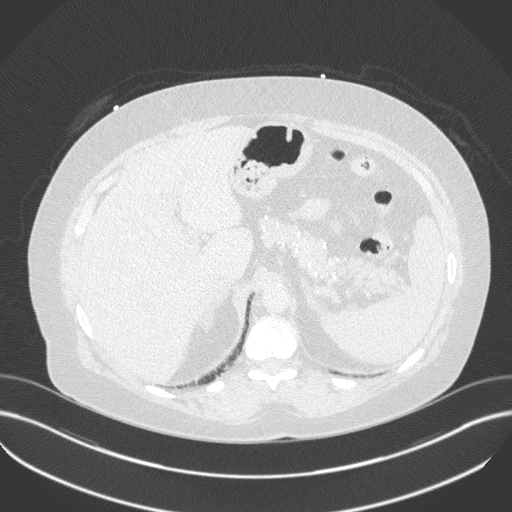
[im 82/306  soft-tissue]
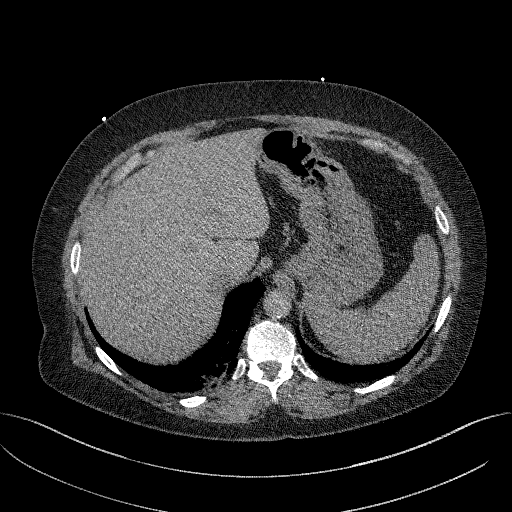
[im 102/306  lung]
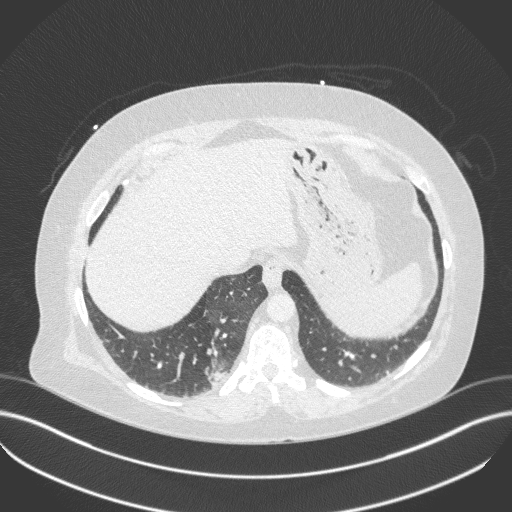
[im 123/306  soft-tissue]
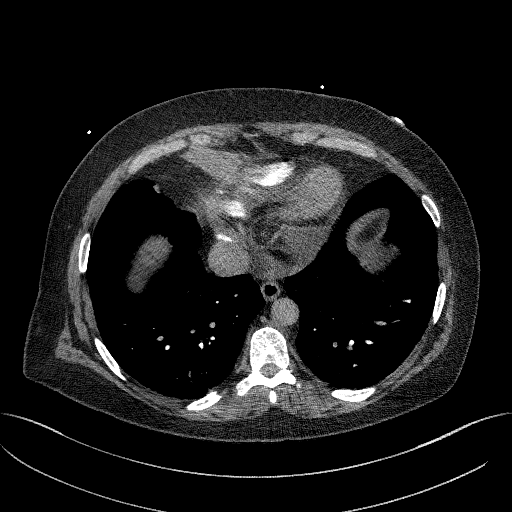
[im 143/306  lung]
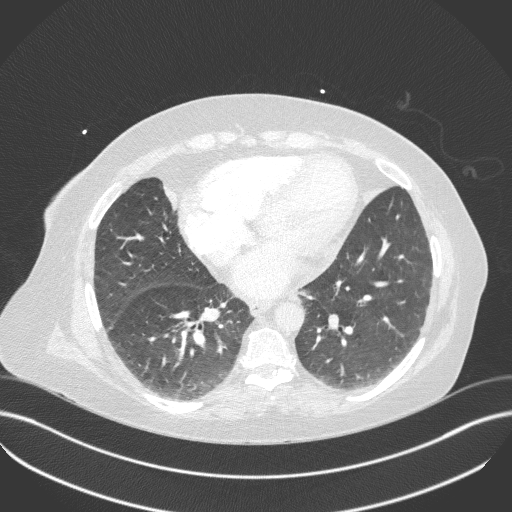
[im 163/306  soft-tissue]
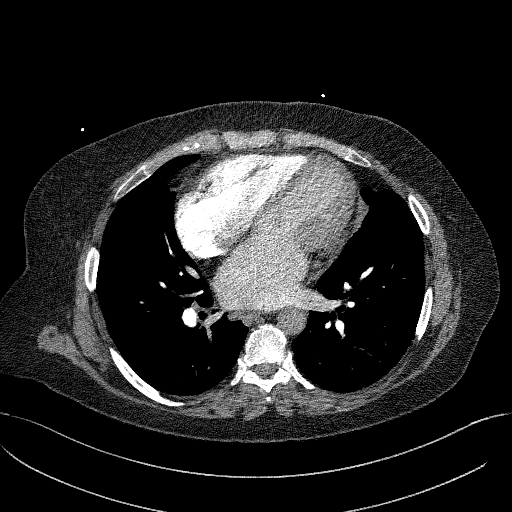
[im 184/306  lung]
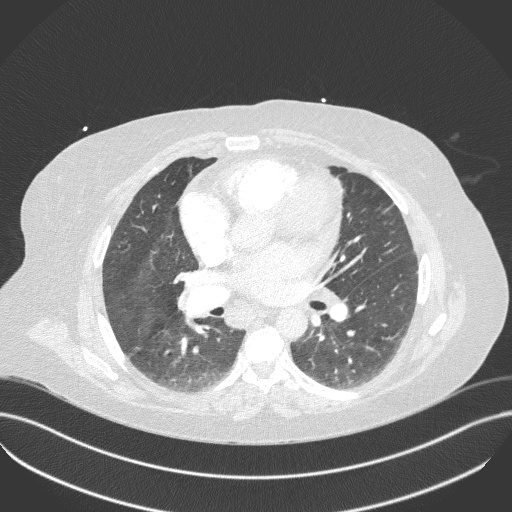
[im 204/306  soft-tissue]
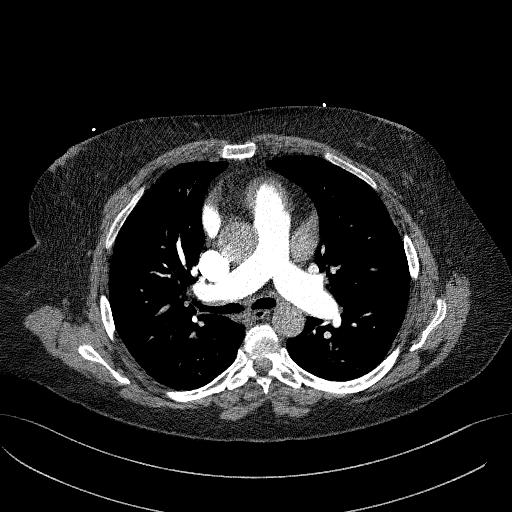
[im 224/306  lung]
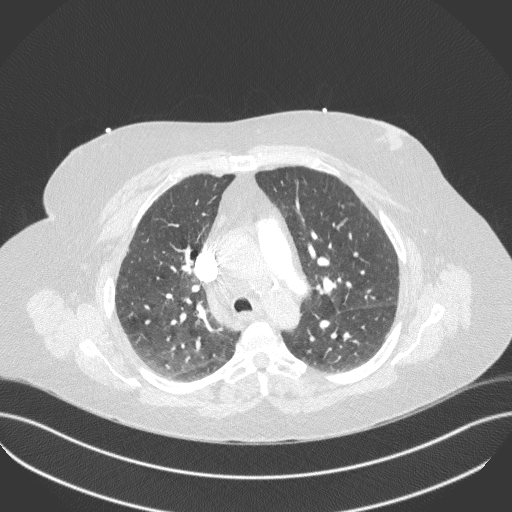
[im 245/306  soft-tissue]
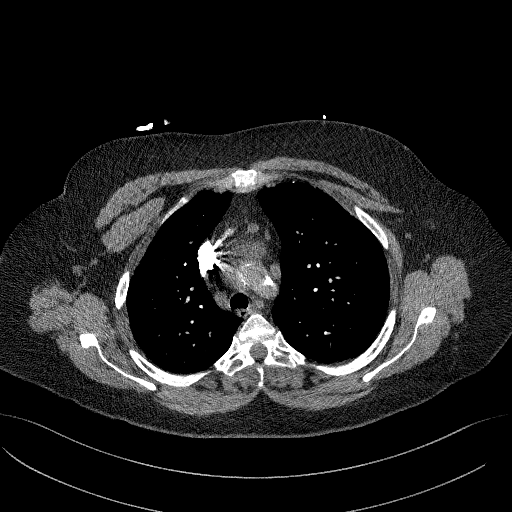
[im 265/306  lung]
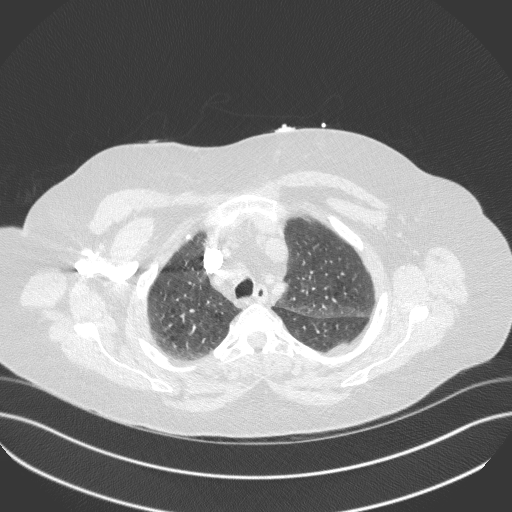
[im 285/306  soft-tissue]
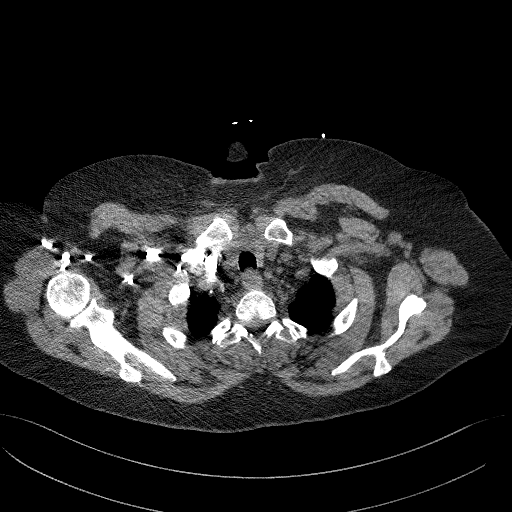

[Series 8: cor soft · coronal · 0.62mm/px · 3 of 149 slices shown]
[im 38/149  soft-tissue]
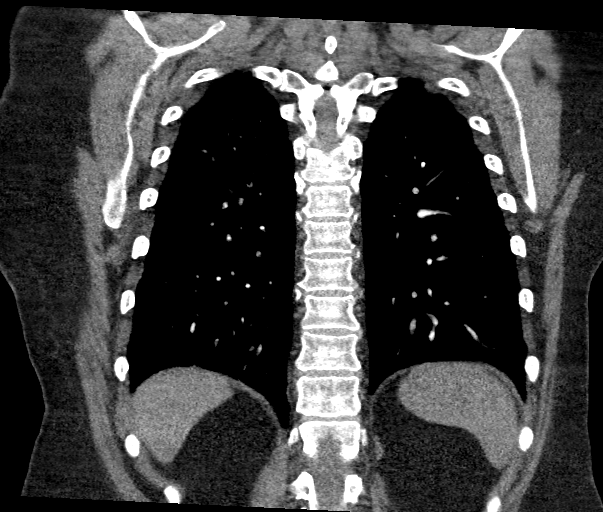
[im 75/149  soft-tissue]
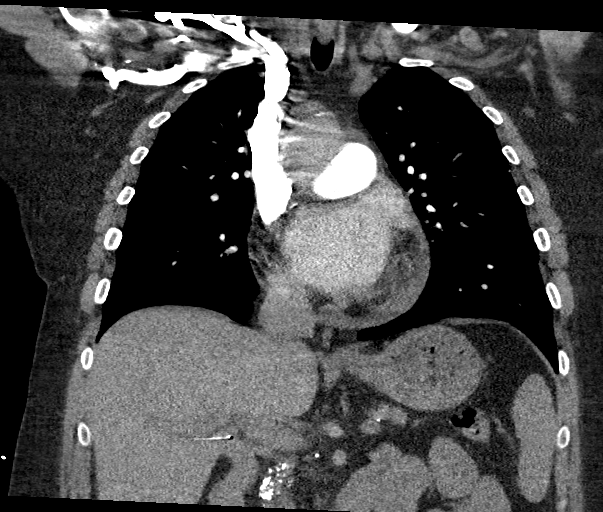
[im 112/149  soft-tissue]
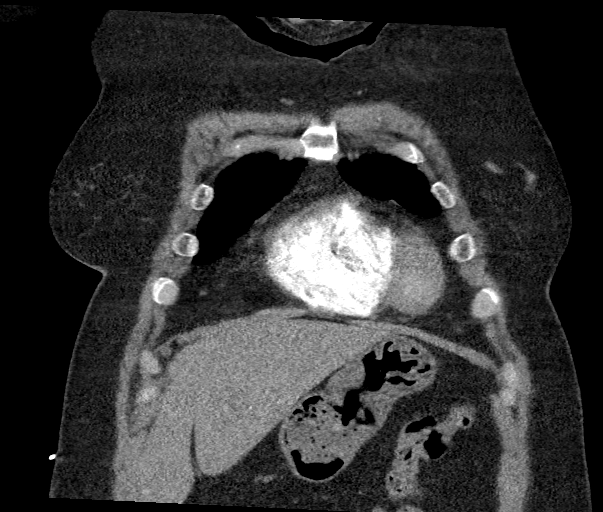

[17 of 46 positions shown; findings below may reference images not displayed]

FINDINGS: Cardiovascular: Satisfactory opacification of the pulmonary arteries
to the segmental level. No evidence of pulmonary embolism. The main
pulmonary artery is normal in caliber. The main pulmonary artery is
normal in caliber. Enlarged left atrium. No significant pericardial
effusion. The thoracic aorta is normal in caliber. Mild
atherosclerotic plaque of the thoracic aorta. No coronary artery
calcifications.

Mediastinum/Nodes: Prominent but nonenlarged mediastinal lymph
nodes. No enlarged mediastinal, hilar, or axillary lymph nodes.
Thyroid gland, trachea, and esophagus demonstrate no significant
findings.

Lungs/Pleura: Centrilobular emphysematous changes. Similar-appearing
right lower lobe scarring with associated punctate calcified
subpleural nodule ([DATE]). No focal consolidation. No pulmonary
nodule. No pulmonary mass. No pleural effusion. No pneumothorax.
Mild bronchial wall thickening.

Upper Abdomen: Findings suggestive of chronic pancreatitis. Status
post cholecystectomy.

Musculoskeletal:

Mass.

No suspicious lytic or blastic osseous lesions. No acute displaced
fracture.

Review of the MIP images confirms the above findings.
IMPRESSION: 1. No pulmonary embolus.
2. No acute intrathoracic abnormality.
3. Aortic Atherosclerosis ([id number].D) and Emphysema ([id number]).
4. Enlarged left atrium.
5. Chronic pancreatitis.

## 2023-12-12 ENCOUNTER — Telehealth: Payer: Self-pay | Admitting: *Deleted

## 2023-12-12 ENCOUNTER — Encounter: Payer: Self-pay | Admitting: Nurse Practitioner

## 2023-12-12 ENCOUNTER — Ambulatory Visit: Attending: Cardiology | Admitting: Nurse Practitioner

## 2023-12-12 VITALS — BP 108/74 | HR 88 | Ht 63.0 in | Wt 198.0 lb

## 2023-12-12 DIAGNOSIS — J449 Chronic obstructive pulmonary disease, unspecified: Secondary | ICD-10-CM

## 2023-12-12 DIAGNOSIS — Z952 Presence of prosthetic heart valve: Secondary | ICD-10-CM | POA: Diagnosis not present

## 2023-12-12 DIAGNOSIS — I4819 Other persistent atrial fibrillation: Secondary | ICD-10-CM

## 2023-12-12 DIAGNOSIS — E1159 Type 2 diabetes mellitus with other circulatory complications: Secondary | ICD-10-CM

## 2023-12-12 DIAGNOSIS — I071 Rheumatic tricuspid insufficiency: Secondary | ICD-10-CM

## 2023-12-12 DIAGNOSIS — E785 Hyperlipidemia, unspecified: Secondary | ICD-10-CM

## 2023-12-12 DIAGNOSIS — I2510 Atherosclerotic heart disease of native coronary artery without angina pectoris: Secondary | ICD-10-CM

## 2023-12-12 DIAGNOSIS — I5032 Chronic diastolic (congestive) heart failure: Secondary | ICD-10-CM | POA: Diagnosis not present

## 2023-12-12 DIAGNOSIS — Z8673 Personal history of transient ischemic attack (TIA), and cerebral infarction without residual deficits: Secondary | ICD-10-CM

## 2023-12-12 DIAGNOSIS — Z720 Tobacco use: Secondary | ICD-10-CM

## 2023-12-12 MED ORDER — AMOXICILLIN 500 MG PO TABS
ORAL_TABLET | ORAL | 3 refills | Status: DC
Start: 2023-12-12 — End: 2023-12-12

## 2023-12-12 MED ORDER — AMOXICILLIN 500 MG PO TABS: ORAL_TABLET | ORAL | 3 refills | Status: DC

## 2023-12-12 MED ORDER — AMOXICILLIN 500 MG PO TABS: ORAL_TABLET | ORAL | Status: AC

## 2023-12-12 NOTE — Patient Instructions (Addendum)
 Medication Instructions:  Amoxicillin  2 gm 30-60 min prior to dental procedures.   *If you need a refill on your cardiac medications before your next appointment, please call your pharmacy*  Lab Work: CBC, CMET, TSH   Testing/Procedures: NONE ordered at this time of appointment   Follow-Up: At [facility], you and your health needs are our priority.  As part of our continuing mission to provide you with exceptional heart care, our providers are all part of one team.  This team includes your primary Cardiologist (physician) and Advanced Practice Providers or APPs (Physician Assistants and Nurse Practitioners) who all work together to provide you with the care you need, when you need it.  Your next appointment:   3-5 month(s)  Provider:   Oneil Parchment, MD    We recommend signing up for the patient portal called MyChart.  Sign up information is provided on this After Visit Summary.  MyChart is used to connect with patients for Virtual Visits (Telemedicine).  Patients are able to view lab/test results, encounter notes, upcoming appointments, etc.  Non-urgent messages can be sent to your provider as well.   To learn more about what you can do with MyChart, go to [email or web address].   Other Instructions

## 2023-12-12 NOTE — Progress Notes (Unsigned)
 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  Past Medical History    Past Medical History:  Diagnosis Date   Allergy    Anemia    Anxiety    Aortic atherosclerosis (HCC)    Arthritis    Asthma    Back pain    CAD (coronary artery disease)    Chronic headaches    Class 1 obesity due to excess calories with serious comorbidity in adult    Complication of anesthesia    slow to wake up   COPD (chronic obstructive pulmonary disease) (HCC)    Diabetes (HCC)    Fatty liver    GERD (gastroesophageal reflux disease)    Headache    Herniated lumbar intervertebral disc    High cholesterol    History of blood clots    Lumbago with sciatica, unspecified side    Mitral stenosis    severe by 05/18/21 echo   Moderate episode of recurrent major depressive disorder (HCC)    Nicotine  dependence, cigarettes, with other nicotine -induced disorders    Nonrheumatic aortic (valve) insufficiency    Other chronic pain    Pain in joint involving ankle and foot    Pancreatitis    Pneumonia    Presence urogenital implant    Severe mitral regurgitation    Sleep apnea    no cpap   Stroke (HCC)    mild   SUI (stress urinary incontinence, female)    Tricuspid regurgitation    Type 2 diabetes mellitus with hyperglycemia, with long-term current use of insulin  (HCC)    Past Surgical History:  Procedure Laterality Date   ANKLE SURGERY     x 3    Bladder tack     BREAST REDUCTION SURGERY     CARDIOVERSION N/A 07/02/2023   Procedure: CARDIOVERSION;  Surgeon: Barbaraann Darryle Ned, MD;  Location: [facility] INVASIVE CV LAB;  Service: Cardiovascular;  Laterality: N/A;   CHOLECYSTECTOMY  1998   HERNIA REPAIR     I & D EXTREMITY Right 10/28/2021   Procedure: IRRIGATION AND DEBRIDEMENT EXTREMITY, GREAT TOE;  Surgeon: Barton Drape, MD;  Location: [facility] OR;  Service: Orthopedics;  Laterality: Right;   MITRAL VALVE REPLACEMENT N/A 05/11/2023   Procedure: MITRAL VALVE (MV) REPLACEMENT USING MOSAIC VALVE SIZE ;  Surgeon: Maryjane Mt, MD;  Location: [facility] OR;  Service: Open Heart Surgery;  Laterality: N/A;   RIGHT/LEFT HEART CATH AND CORONARY ANGIOGRAPHY N/A 11/02/2022   Procedure: RIGHT/LEFT HEART CATH AND CORONARY ANGIOGRAPHY;  Surgeon: Wonda Sharper, MD;  Location: [facility] INVASIVE CV LAB;  Service: Cardiovascular;  Laterality: N/A;   TEE WITHOUT CARDIOVERSION N/A 11/02/2022   Procedure: TRANSESOPHAGEAL ECHOCARDIOGRAM;  Surgeon: Parchment Oneil BROCKS, MD;  Location: [facility] INVASIVE CV LAB;  Service: Cardiovascular;  Laterality: N/A;   TEE WITHOUT CARDIOVERSION N/A 05/11/2023   Procedure: TRANSESOPHAGEAL ECHOCARDIOGRAM;  Surgeon: Maryjane Mt, MD;  Location: [facility] OR;  Service: Open Heart Surgery;  Laterality: N/A;   TRANSESOPHAGEAL ECHOCARDIOGRAM (CATH LAB) N/A 07/02/2023   Procedure: TRANSESOPHAGEAL  ECHOCARDIOGRAM;  Surgeon: Barbaraann Darryle Ned, MD;  Location: [facility] INVASIVE CV LAB;  Service: Cardiovascular;  Laterality: N/A;   VAGINAL HYSTERECTOMY  1988    Allergies  Allergies  Allergen Reactions   Erythromycin Shortness Of Breath   Nickel Rash and Shortness Of Breath   Oxycodone  Shortness Of Breath   Meperidine Other (See Comments)    Confusion and tingling  (noted in H&P)    Nicotine  Hives and Itching   Codeine Swelling   Hydrocodone -Acetaminophen  Itching    Tolerated Norco 11/2018   Morphine Itching   Propoxyphene Other (See Comments)     Ineffective    Quetiapine     Restless leg syndrome     Labs/Other Studies Reviewed    The following studies were reviewed today:  Cardiac Studies & Procedures   ______________________________________________________________________________________________ CARDIAC CATHETERIZATION  CARDIAC CATHETERIZATION 11/02/2022  Conclusion Patent coronary arteries with mild nonobstructive plaquing in the RCA (Right dominant), no stenosis in the left main, LAD, or LCx Low cardiac output based on Fick measurement (question accuracy as patient seems well-compensated) Hemodynamic findings consistent with severe MS and severe MR (mean trans-mitral gradient 15 mmHg, calculated mitral valve area 0.7 square cm) and large V waves of 50 mmHg  Plan: Cardiac surgical consultation for mitral valve replacement  Findings Coronary Findings Diagnostic  Dominance: Right  Left Anterior Descending The vessel exhibits minimal luminal irregularities. The LAD terminates in the distal anterior wall. The first diagonal supplies a large territory. There is no stenosis in the LAD or diagonal branches.  Left Circumflex Vessel is angiographically normal. The circumflex is patent with no stenosis. The OM branches are patent.  Right Coronary Artery Prox RCA to Mid RCA lesion is 30% stenosed.  Intervention  No interventions have been documented.   STRESS TESTS  PCV MYOCARDIAL PERFUSION WO LEXISCAN 05/18/2021  Interpretation Summary Exercise nuclear stress test 05/18/2021: Normal ECG stress. The patient exercised for 4 minutes and 32 seconds of a Bruce protocol, achieving approximately 4.64 METs. Reduced exercise tolerance. The blood pressure response was normal. Myocardial perfusion is normal. Overall LV systolic function is normal without regional wall motion abnormalities. Stress LV EF: 67%. Increased RV uptake suggests RVH. No previous exam available for comparison. Low risk study.    ECHOCARDIOGRAM  ECHOCARDIOGRAM COMPLETE 07/01/2023  Narrative ECHOCARDIOGRAM REPORT    Patient Name:   Rose [name] Date of Exam: 07/01/2023 Medical Rec #:  [id number]      Height:       63.0 in Accession #:    [id number]     Weight:       197.0 lb Date of Birth:  [birth year]      BSA:          1.921 m Patient Age:    67 years       BP:           122/57 mmHg Patient Gender: F              HR:           72 bpm. Exam Location:  Inpatient  Procedure: 2D Echo, Cardiac Doppler and Color Doppler  Indications:    Arrhythmia  History:        Patient has prior history of Echocardiogram examinations, most recent 05/09/2023. Stroke and COPD, Mitral Valve Disease; Risk Factors:Sleep Apnea, Hypertension and Diabetes.  Mitral Valve: 29 mm Mosaic bioprosthetic valve valve is present in the mitral position. Procedure Date: 05/11/2023.  Sonographer:    Jayson Gaskins Referring  Phys: [id number] ANASTASSIA DOUTOVA  IMPRESSIONS   1. Left ventricular ejection fraction, by estimation, is 55 to 60%. Left ventricular ejection fraction by PLAX is 60 %. The left ventricle has normal function. The left ventricle has no regional wall motion abnormalities. Left ventricular diastolic function could not be evaluated. 2. Right ventricular systolic function is moderately reduced. The right ventricular size is normal. There is mildly elevated pulmonary artery systolic pressure. The estimated right ventricular systolic pressure is 39.6 mmHg. 3. The mitral valve has been repaired/replaced. Trivial mitral valve regurgitation. The mean mitral valve gradient is 6.3 mmHg. There is a 29 mm Mosaic bioprosthetic valve present in the mitral position. Procedure Date: 05/11/2023. Echo findings are consistent with normal structure and function of the mitral valve prosthesis. 4. A small pericardial effusion is present. The pericardial effusion is posterior to the left ventricle. 5. The aortic valve was not well visualized. Aortic  valve regurgitation is not visualized. Aortic valve sclerosis/calcification is present, without any evidence of aortic stenosis. Aortic valve mean gradient measures 7.0 mmHg. 6. The inferior vena cava is normal in size with <50% respiratory variability, suggesting right atrial pressure of 8 mmHg.  Comparison(s): Changes from prior study are noted. 05/09/2023: LVEF 60-65%, moderate to severe MR - trivial pericardial effusion. Compared to this study, the mitral valve has been replaced with a bioprothesis.  FINDINGS Left Ventricle: Left ventricular ejection fraction, by estimation, is 55 to 60%. Left ventricular ejection fraction by PLAX is 60 %. The left ventricle has normal function. The left ventricle has no regional wall motion abnormalities. The left ventricular internal cavity size was normal in size. There is no left ventricular hypertrophy. Left ventricular diastolic function could not be evaluated due to mitral valve replacement. Left ventricular diastolic function could not be evaluated.  Right Ventricle: The right ventricular size is normal. No increase in right ventricular wall thickness. Right ventricular systolic function is moderately reduced. There is mildly elevated pulmonary artery systolic pressure. The tricuspid regurgitant velocity is 2.81 m/s, and with an assumed right atrial pressure of 8 mmHg, the estimated right ventricular systolic pressure is 39.6 mmHg.  Left Atrium: Left atrial size was normal in size.  Right Atrium: Right atrial size was normal in size.  Pericardium: A small pericardial effusion is present. The pericardial effusion is posterior to the left ventricle.  Mitral Valve: The mitral valve has been repaired/replaced. Trivial mitral valve regurgitation. There is a 29 mm Mosaic bioprosthetic valve present in the mitral position. Procedure Date: 05/11/2023. Echo findings are consistent with normal structure and function of the mitral valve prosthesis. MV peak  gradient, 15.7 mmHg. The mean mitral valve gradient is 6.3 mmHg.  Tricuspid Valve: The tricuspid valve is grossly normal. Tricuspid valve regurgitation is mild.  Aortic Valve: The aortic valve was not well visualized. Aortic valve regurgitation is not visualized. Aortic valve sclerosis/calcification is present, without any evidence of aortic stenosis. Aortic valve mean gradient measures 7.0 mmHg. Aortic valve peak gradient measures 12.4 mmHg. Aortic valve area, by VTI measures 1.70 cm.  Pulmonic Valve: The pulmonic valve was normal in structure. Pulmonic valve regurgitation is not visualized.  Aorta: The aortic root and ascending aorta are structurally normal, with no evidence of dilitation.  Venous: The inferior vena cava is normal in size with less than 50% respiratory variability, suggesting right atrial pressure of 8 mmHg.  IAS/Shunts: No atrial level shunt detected by color flow Doppler.   LEFT VENTRICLE PLAX 2D LV EF:  Left            Diastology ventricular     LV e' medial:    5.55 cm/s ejection        LV E/e' medial:  29.0 fraction by     LV e' lateral:   6.74 cm/s PLAX is 60      LV E/e' lateral: 23.9 %. LVIDd:         4.40 cm LVIDs:         3.00 cm LV PW:         0.80 cm LV IVS:        0.70 cm LVOT diam:     1.80 cm LV SV:         72 LV SV Index:   38 LVOT Area:     2.54 cm   RIGHT VENTRICLE RV S prime:     7.83 cm/s  LEFT ATRIUM             Index        RIGHT ATRIUM           Index LA Vol (A2C):   51.4 ml 26.75 ml/m  RA Area:     21.00 cm LA Vol (A4C):   50.8 ml 26.44 ml/m  RA Volume:   57.30 ml  29.83 ml/m LA Biplane Vol: 51.9 ml 27.01 ml/m AORTIC VALVE AV Area (Vmax):    1.82 cm AV Area (Vmean):   1.87 cm AV Area (VTI):     1.70 cm AV Vmax:           176.00 cm/s AV Vmean:          131.000 cm/s AV VTI:            0.426 m AV Peak Grad:      12.4 mmHg AV Mean Grad:      7.0 mmHg LVOT Vmax:         126.00 cm/s LVOT Vmean:        96.200  cm/s LVOT VTI:          0.284 m LVOT/AV VTI ratio: 0.67  MITRAL VALVE                TRICUSPID VALVE MV Area (PHT): 3.68 cm     TR Peak grad:   31.6 mmHg MV Area VTI:   1.65 cm     TR Vmax:        281.00 cm/s MV Peak grad:  15.7 mmHg MV Mean grad:  6.3 mmHg     SHUNTS MV Vmax:       1.98 m/s     Systemic VTI:  0.28 m MV Vmean:      120.0 cm/s   Systemic Diam: 1.80 cm MV Decel Time: 206 msec MV E velocity: 161.00 cm/s MV A velocity: 107.00 cm/s MV E/A ratio:  1.50  Vinie Maxcy MD Electronically signed by Vinie Maxcy MD Signature Date/Time: 07/01/2023/1:07:43 PM    Final   TEE  ECHO TEE 07/02/2023  Narrative TRANSESOPHOGEAL ECHO REPORT    Patient Name:   CASSADY STANCZAK Date of Exam: 07/02/2023 Medical Rec #:  [id number]      Height:       63.0 in Accession #:    [id number]     Weight:       197.0 lb Date of Birth:  15-Jan-1956      BSA:          1.921 m Patient Age:  67 years       BP:           106/77 mmHg Patient Gender: F              HR:           75 bpm. Exam Location:  Inpatient  Procedure: Transesophageal Echo, 3D Echo, Cardiac Doppler and Color Doppler  Indications:     I48.92* Unspecified atrial flutter  History:         Patient has prior history of Echocardiogram examinations, most recent 07/01/2023. CHF, CAD, COPD; Risk Factors:Diabetes and Dyslipidemia.  Mitral Valve: 29 mm bioprosthetic valve valve is present in the mitral position. Procedure Date: 05/11/2023.  Sonographer:     Damien Senior RDCS Referring Phys:  [id number] DARRYLE NED O'NEAL Diagnosing Phys: DARRYLE Decent MD  PROCEDURE: After discussion of the risks and benefits of a TEE, an informed consent was obtained from the patient. TEE procedure time was 7 minutes. The transesophogeal probe was passed without difficulty through the esophogus of the patient. Sedation performed by different physician. The patient was monitored while under deep sedation. Anesthestetic sedation was provided  intravenously by Anesthesiology: 264mg  of Propofol . Image quality was excellent. The patient's vital signs; including heart rate, blood pressure, and oxygen saturation; remained stable throughout the procedure. The patient developed no complications during the procedure. A successful direct current cardioversion was performed at 200 joules with 1 attempt.  IMPRESSIONS   1. Left ventricular ejection fraction, by estimation, is 50 to 55%. The left ventricle has low normal function. 2. Right ventricular systolic function is normal. The right ventricular size is normal. 3. Left atrial size was mild to moderately dilated. No left atrial/left atrial appendage thrombus was detected. The LAA emptying velocity was 63 cm/s. 4. Normal bioprothestic MV. Normal function and no regurgitation. Assessed by 3D echo. The mitral valve has been repaired/replaced. No evidence of mitral valve regurgitation. The mean mitral valve gradient is 4.0 mmHg with average heart rate of 74 bpm. There is a 29 mm bioprosthetic valve present in the mitral position. Procedure Date: 05/11/2023. Echo findings are consistent with normal structure and function of the mitral valve prosthesis. 5. Tricuspid valve regurgitation is moderate. 6. The aortic valve is tricuspid. There is mild calcification of the aortic valve. Aortic valve regurgitation is trivial. Aortic valve sclerosis is present, with no evidence of aortic valve stenosis. 7. There is Moderate (Grade III) layered plaque involving the descending aorta and aortic arch.  Conclusion(s)/Recommendation(s): No LA/LAA thrombus identified. Successful cardioversion performed with restoration of normal sinus rhythm.  FINDINGS Left Ventricle: Left ventricular ejection fraction, by estimation, is 50 to 55%. The left ventricle has low normal function. The left ventricular internal cavity size was normal in size.  Right Ventricle: The right ventricular size is normal. No increase in right  ventricular wall thickness. Right ventricular systolic function is normal.  Left Atrium: Left atrial size was mild to moderately dilated. No left atrial/left atrial appendage thrombus was detected. The LAA emptying velocity was 63 cm/s.  Right Atrium: Right atrial size was normal in size.  Pericardium: There is no evidence of pericardial effusion.  Mitral Valve: Normal bioprothestic MV. Normal function and no regurgitation. Assessed by 3D echo. The mitral valve has been repaired/replaced. No evidence of mitral valve regurgitation. There is a 29 mm bioprosthetic valve present in the mitral position. Procedure Date: 05/11/2023. Echo findings are consistent with normal structure and function of the mitral valve prosthesis. MV peak gradient, 10.2 mmHg.  The mean mitral valve gradient is 4.0 mmHg with average heart rate of 74 bpm.  Tricuspid Valve: The tricuspid valve is grossly normal. Tricuspid valve regurgitation is moderate . No evidence of tricuspid stenosis.  Aortic Valve: The aortic valve is tricuspid. There is mild calcification of the aortic valve. Aortic valve regurgitation is trivial. Aortic valve sclerosis is present, with no evidence of aortic valve stenosis.  Pulmonic Valve: The pulmonic valve was grossly normal. Pulmonic valve regurgitation is not visualized. No evidence of pulmonic stenosis.  Aorta: The aortic root and ascending aorta are structurally normal, with no evidence of dilitation. There is moderate (Grade III) layered plaque involving the descending aorta and aortic arch.  Venous: The left lower pulmonary vein, left upper pulmonary vein, right upper pulmonary vein and right lower pulmonary vein are normal.  IAS/Shunts: The atrial septum is grossly normal.  Additional Comments: Spectral Doppler performed.  LEFT VENTRICLE PLAX 2D LVOT diam:     1.80 cm LV SV:         67 LV SV Index:   35 LVOT Area:     2.54 cm   AORTIC VALVE LVOT Vmax:   124.00 cm/s LVOT Vmean:   96.000 cm/s LVOT VTI:    0.262 m  AORTA Ao Root diam: 2.82 cm Ao Asc diam:  3.05 cm  MITRAL VALVE MV Area VTI:  1.75 cm   SHUNTS MV Peak grad: 10.2 mmHg  Systemic VTI:  0.26 m MV Mean grad: 4.0 mmHg   Systemic Diam: 1.80 cm MV Vmax:      1.60 m/s MV Vmean:     95.9 cm/s  Darryle Decent MD Electronically signed by Darryle Decent MD Signature Date/Time: 07/02/2023/2:52:22 PM    Final (Updated)        ______________________________________________________________________________________________     Recent Labs: 06/30/2023: TSH 1.510 07/01/2023: Magnesium  2.1 11/09/2023: ALT 21; B Natriuretic Peptide 138.6; BUN 11; Creatinine, Ser 1.38; Hemoglobin 8.8; Platelets 192; Potassium 3.4; Sodium 139  Recent Lipid Panel    Component Value Date/Time   CHOL 158 12/13/2018 0647   TRIG 133 12/13/2018 0647   HDL 51 12/13/2018 0647   CHOLHDL 3.1 12/13/2018 0647   VLDL 27 12/13/2018 0647   LDLCALC 80 12/13/2018 0647    History of Present Illness   68 year old female with the above past medical history including mild nonobstructive CAD, 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  Cardiac catheterization in 10/2022 showed mild nonobstructive CAD.  She has a history of severe mitral valve stenosis, severe TR.  She was evaluated by CT surgery and ultimately underwent mitral valve replacement with 29 mm Mosaic bioprosthetic mitral valve as well as tricuspid valve repair in 04/2023.  She did develop postop atrial fibrillation.  She was started on Coumadin .  She was last seen in the office on 05/28/2023 was stable from a cardiac standpoint.  She was hospitalized in February 2025 in the setting of acute on chronic diastolic heart failure, atrial flutter with RVR.  She underwent  TEE/DCCV.  She was started on amiodarone  therapy.  Echocardiogram in 2/25 showed EF of 56%, normal LV function, moderately reduced RV systolic function, mildly elevated PASP, stable mitral valve replacement, small pericardial effusion.  She was evaluated in the ED in 07/2023 in the setting of shortness of breath, chest pain.  EKG showed  rate controlled atrial fibrillation.  Troponin was negative, chest x-ray was unremarkable.  She was evaluated in the ED on 11/09/2023 in the setting of chest pain. EKG showed atrial flutter, troponins negative x 2. Chest x-ray was unremarkable. She was discharged home in stable condition and advised follow-up with cardiology as an outpatient.  She presents today for follow-up.  Since her last visit and since her recent ED visit With a myriad of concerns.  She reports blurry vision, this is not a new concern, rotting teeth, stable chronic dyspnea on exertion, intermittent chest discomfort, relieved with palpation, denies any significant palpitations, denies dizziness, presyncope, syncope, she does have some nonpitting bilateral lower extremity edema, right lower extremity erythema, recent lower extremity duplex was negative for DVT.  She thinks that the erythema is likely related to her eczema.  She is overdue for follow-up with pulmonology.  Will provide prescription for amoxicillin  2 g to take prior to dental work.  She is taking Lasix  60 mg daily.  She is taking amiodarone  200 mg daily.  EKG today shows rate controlled atrial fibrillation.  Will check CBC, CMET, TSH.  Will refer to A-fib clinic given persistent atrial fibrillation on amiodarone  therapy.  She continues to smoke, she is not interested in quitting at this time.  No indication for ischemic evaluation.  Denies bleeding on warfarin.  Follow-up in 3 to 5 months with Dr. Jeffrie, will refer to A-fib clinic. CAD/chest pain: Persistent atrial fibrillation/atrial flutter: Chronic diastolic heart failure: Severe mitral  valve stenosis/tricuspid valve regurgitation: Hyperlipidemia: History of CVA: Type 2 diabetes: COPD/tobacco use: OSA: Disposition:  Home Medications    Current Outpatient Medications  Medication Sig Dispense Refill   acetaminophen  (TYLENOL ) 500 MG tablet Take 500 mg by mouth every 6 (six) hours as needed for mild pain (pain score 1-3) or headache.     albuterol  (VENTOLIN  HFA) 108 (90 Base) MCG/ACT inhaler SMARTSIG:1 Puff(s) Via Inhaler Every 6 Hours PRN     amiodarone  (PACERONE ) 200 MG tablet Take 1 tablet (200 mg total) by mouth 2 (two) times daily. 180 tablet 3   cephALEXin  (KEFLEX ) 500 MG capsule Take 1 capsule (500 mg total) by mouth 4 (four) times daily. 28 capsule 0   clonazePAM  (KLONOPIN ) 1 MG tablet Take 1 mg by mouth 3 (three) times daily.     clotrimazole (MYCELEX) 10 MG troche SMARTSIG:1 Lozenge(s) By Mouth 5 Times Daily     Fluticasone -Umeclidin-Vilant (TRELEGY ELLIPTA) 200-62.5-25 MCG/ACT AEPB Inhale 200 mcg into the lungs daily as needed.     furosemide  (LASIX ) 20 MG tablet Take 2 tablets (40 mg total) by mouth daily. 180 tablet 3   gabapentin  (NEURONTIN ) 800 MG tablet Take 0.5 tablets (400 mg total) by mouth 3 (three) times daily.     metFORMIN  (GLUCOPHAGE ) 500 MG tablet Take 500 mg by mouth 2 (two) times daily.     midodrine  (PROAMATINE ) 10 MG tablet Take 0.5 tablets (5 mg total) by mouth 2 (two) times daily.     omeprazole (PRILOSEC) 40 MG capsule Take 40 mg by mouth daily.     ondansetron  (ZOFRAN ) 4 MG tablet Take 4 mg by mouth every 8 (eight) hours as needed for nausea or vomiting.     OXYGEN Inhale 2 L into the lungs continuous.     Pancrelipase , Lip-Prot-Amyl, (CREON ) 24000-76000 units CPEP Take 2 capsules by mouth 3 (three) times daily after meals.     rOPINIRole  (REQUIP ) 1 MG tablet Take 1 mg by mouth at bedtime.  rosuvastatin  (CRESTOR ) 40 MG tablet Take 1 tablet (40 mg total) by mouth at bedtime. 90 tablet 2   rosuvastatin  (CRESTOR ) 40 MG tablet Take 1 tablet  (40 mg total) by mouth at bedtime. 90 tablet 2   sertraline  (ZOLOFT ) 50 MG tablet Take 50 mg by mouth every morning.     traZODone  (DESYREL ) 50 MG tablet Take 150 mg by mouth at bedtime.     warfarin (COUMADIN ) 2.5 MG tablet TAKE 1 TO 1&1/2 TABLETS BY MOUTH EVERY DAY AS DIRECTED by Coumadin  Clinic 40 tablet 2   No current facility-administered medications for this visit.     Review of Systems    ***.  All other systems reviewed and are otherwise negative except as noted above.    Physical Exam    VS:  BP 108/74   Pulse 88   Ht 5' 3 (1.6 m)   Wt 198 lb (89.8 kg)   SpO2 92%   BMI 35.07 kg/m  , BMI Body mass index is 35.07 kg/m.     GEN: Well nourished, well developed, in no acute distress. HEENT: normal. Neck: Supple, no JVD, carotid bruits, or masses. Cardiac: RRR, no murmurs, rubs, or gallops. No clubbing, cyanosis, edema.  Radials/DP/PT 2+ and equal bilaterally.  Respiratory:  Respirations regular and unlabored, clear to auscultation bilaterally. GI: Soft, nontender, nondistended, BS + x 4. MS: no deformity or atrophy. Skin: warm and dry, no rash. Neuro:  Strength and sensation are intact. Psych: Normal affect.  Accessory Clinical Findings    ECG personally reviewed by me today -    - no acute changes.   Lab Results  Component Value Date   WBC 5.0 11/09/2023   HGB 8.8 (L) 11/09/2023   HCT 30.4 (L) 11/09/2023   MCV 72.4 (L) 11/09/2023   PLT 192 11/09/2023   Lab Results  Component Value Date   CREATININE 1.38 (H) 11/09/2023   BUN 11 11/09/2023   NA 139 11/09/2023   K 3.4 (L) 11/09/2023   CL 96 (L) 11/09/2023   CO2 29 11/09/2023   Lab Results  Component Value Date   ALT 21 11/09/2023   AST 26 11/09/2023   ALKPHOS 105 11/09/2023   BILITOT 0.3 11/09/2023   Lab Results  Component Value Date   CHOL 158 12/13/2018   HDL 51 12/13/2018   LDLCALC 80 12/13/2018   TRIG 133 12/13/2018   CHOLHDL 3.1 12/13/2018    Lab Results  Component Value Date   HGBA1C  6.3 (H) 06/30/2023    Assessment & Plan    1.  ***      Damien JAYSON Braver, NP 12/12/2023, 3:40 PM

## 2023-12-12 NOTE — Telephone Encounter (Signed)
 Patient was dropped off for appointment at [facility] by her daughter. Unable to reach patient's daughter for pickup. Approved cab voucher for transportation to patient's residence.

## 2023-12-13 ENCOUNTER — Telehealth: Payer: Self-pay | Admitting: Licensed Clinical Social Worker

## 2023-12-13 NOTE — Telephone Encounter (Signed)
 H&V Care Navigation CSW Progress Note  Clinical Social Worker was notified by clinic leadership that pt had no ride home yesterday LCSW had left clinic for the day and they arranged a ride via [facility]. I explained to staff how to complete waiver forms etc for next time as needed. Voucher was sent to [facility] for ride. Remain available as needed.  Patient is participating in a Managed Medicaid Plan:  No, UHC Dual Complete  SDOH Screenings   Food Insecurity: Food Insecurity Present (07/02/2023)  Housing: High Risk (07/02/2023)  Transportation Needs: No Transportation Needs (07/02/2023)  Utilities: At Risk (07/02/2023)  Alcohol Screen: Low Risk  (12/13/2018)  Financial Resource Strain: Medium Risk (01/25/2023)  Social Connections: Socially Isolated (07/02/2023)  Tobacco Use: High Risk (12/12/2023)      Marit Lark, MSW, LCSW Clinical Social Worker II [facility] Heart/Vascular Care Navigation  [phone]- work cell phone (preferred)

## 2023-12-14 ENCOUNTER — Encounter: Payer: Self-pay | Admitting: Nurse Practitioner

## 2023-12-21 ENCOUNTER — Other Ambulatory Visit: Payer: Self-pay

## 2023-12-21 ENCOUNTER — Ambulatory Visit: Attending: Internal Medicine

## 2023-12-21 DIAGNOSIS — Z952 Presence of prosthetic heart valve: Secondary | ICD-10-CM

## 2023-12-21 DIAGNOSIS — Z5181 Encounter for therapeutic drug level monitoring: Secondary | ICD-10-CM

## 2023-12-21 DIAGNOSIS — I4891 Unspecified atrial fibrillation: Secondary | ICD-10-CM

## 2023-12-21 LAB — POCT INR: INR: 1.9 — AB (ref 2.0–3.0)

## 2023-12-21 NOTE — Patient Instructions (Signed)
 Description   Take 2 tablets today and then continue taking 1.5 tablet daily EXCEPT 1 tablet on Tuesday, Thursday, and Saturday.  Recheck INR 1 week  Coumadin  clinic [phone]

## 2023-12-21 NOTE — Progress Notes (Signed)
 INR 1.9; Please see anticoagulation encounter

## 2023-12-25 ENCOUNTER — Other Ambulatory Visit (HOSPITAL_COMMUNITY): Payer: Self-pay

## 2023-12-31 ENCOUNTER — Ambulatory Visit: Attending: Cardiology | Admitting: *Deleted

## 2023-12-31 DIAGNOSIS — Z7901 Long term (current) use of anticoagulants: Secondary | ICD-10-CM | POA: Diagnosis not present

## 2023-12-31 DIAGNOSIS — I4891 Unspecified atrial fibrillation: Secondary | ICD-10-CM | POA: Diagnosis not present

## 2023-12-31 DIAGNOSIS — Z952 Presence of prosthetic heart valve: Secondary | ICD-10-CM | POA: Diagnosis not present

## 2023-12-31 LAB — POCT INR: INR: 3.6 — AB (ref 2.0–3.0)

## 2023-12-31 NOTE — Patient Instructions (Signed)
 Description   Do not take any warfarin today and then continue taking 1.5 tablet daily EXCEPT 1 tablet on Tuesday, Thursday, and Saturday.  Recheck INR 1 week.  Coumadin  clinic [phone]

## 2023-12-31 NOTE — Progress Notes (Signed)
 INR 3.6  Please see anticoagulation encounter

## 2024-01-01 IMAGING — CR DG CHEST 2V
2 series · 2 of 2 positions shown · non-contrast
Comparison: Chest radiograph dated 06/02/2021.

CLINICAL DATA: Shortness of breath and chest pain.

EXAM:
CHEST - 2 VIEW

[chest pa]
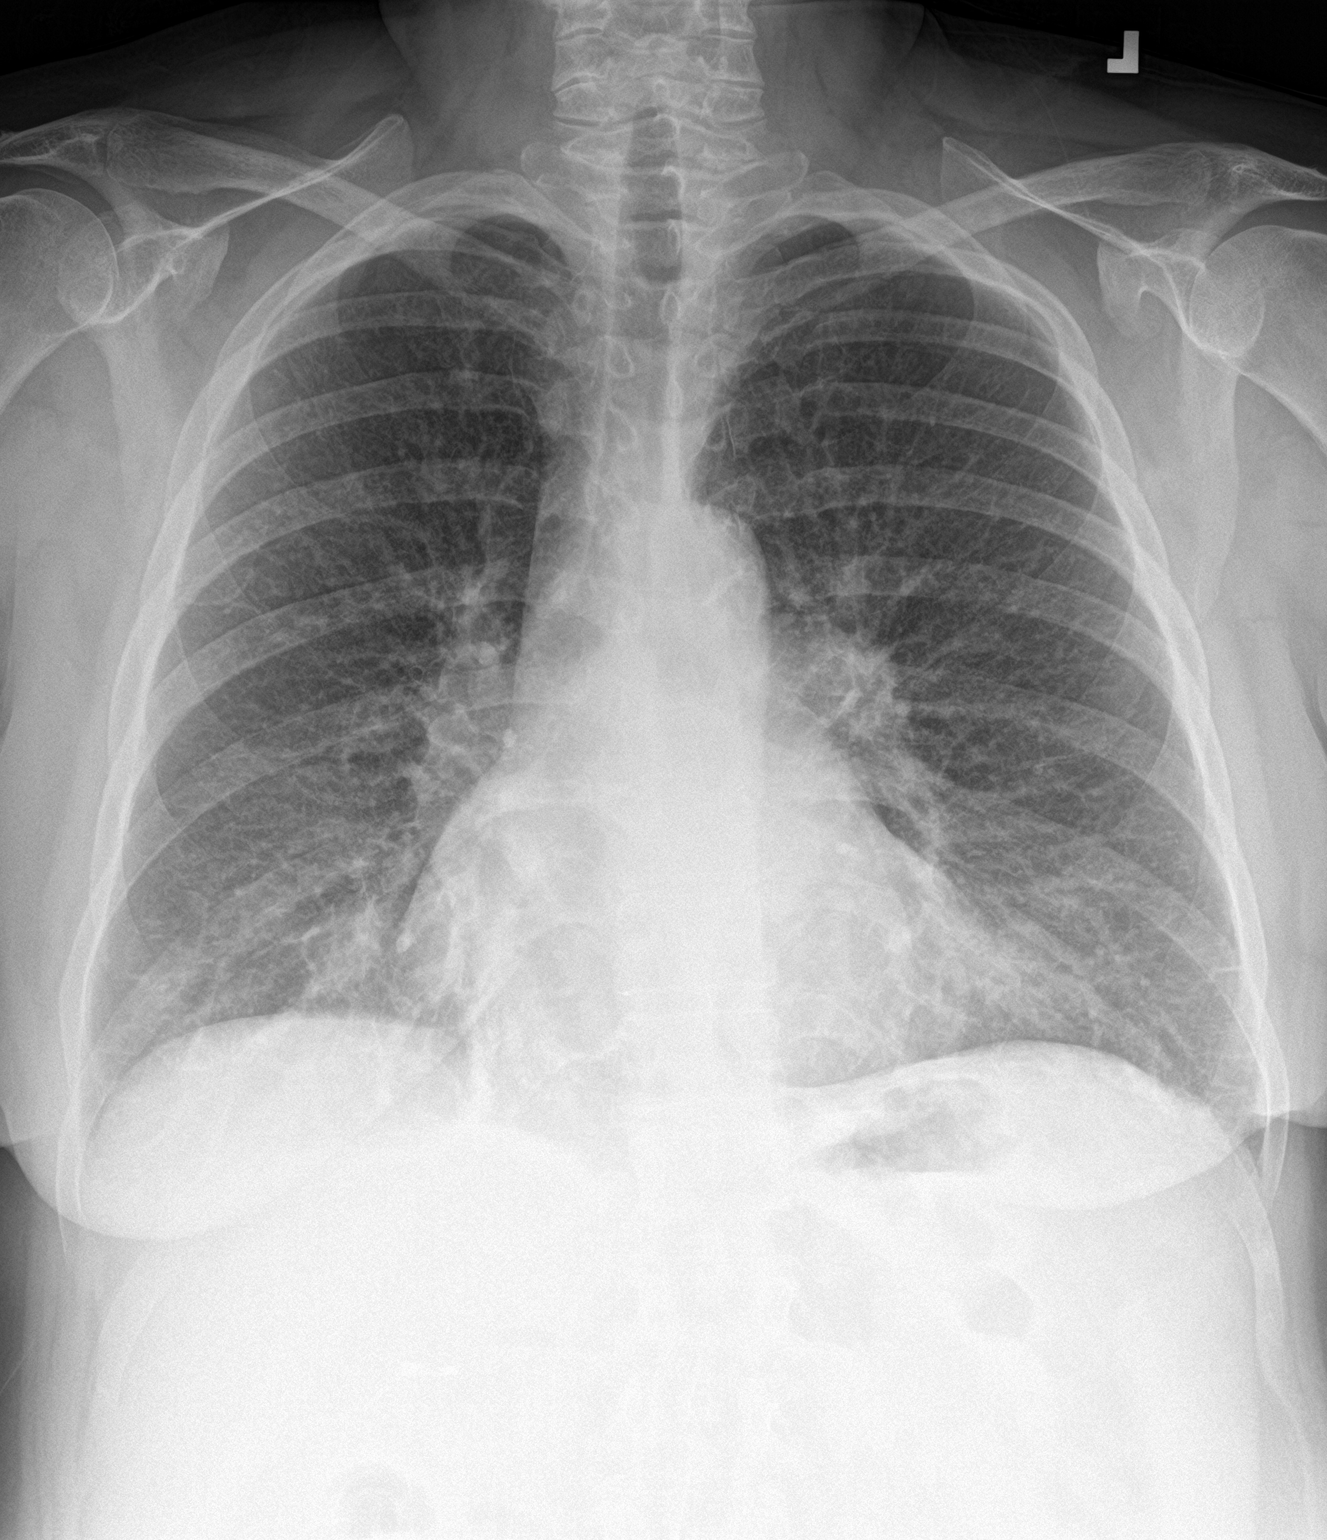

[chest lat]
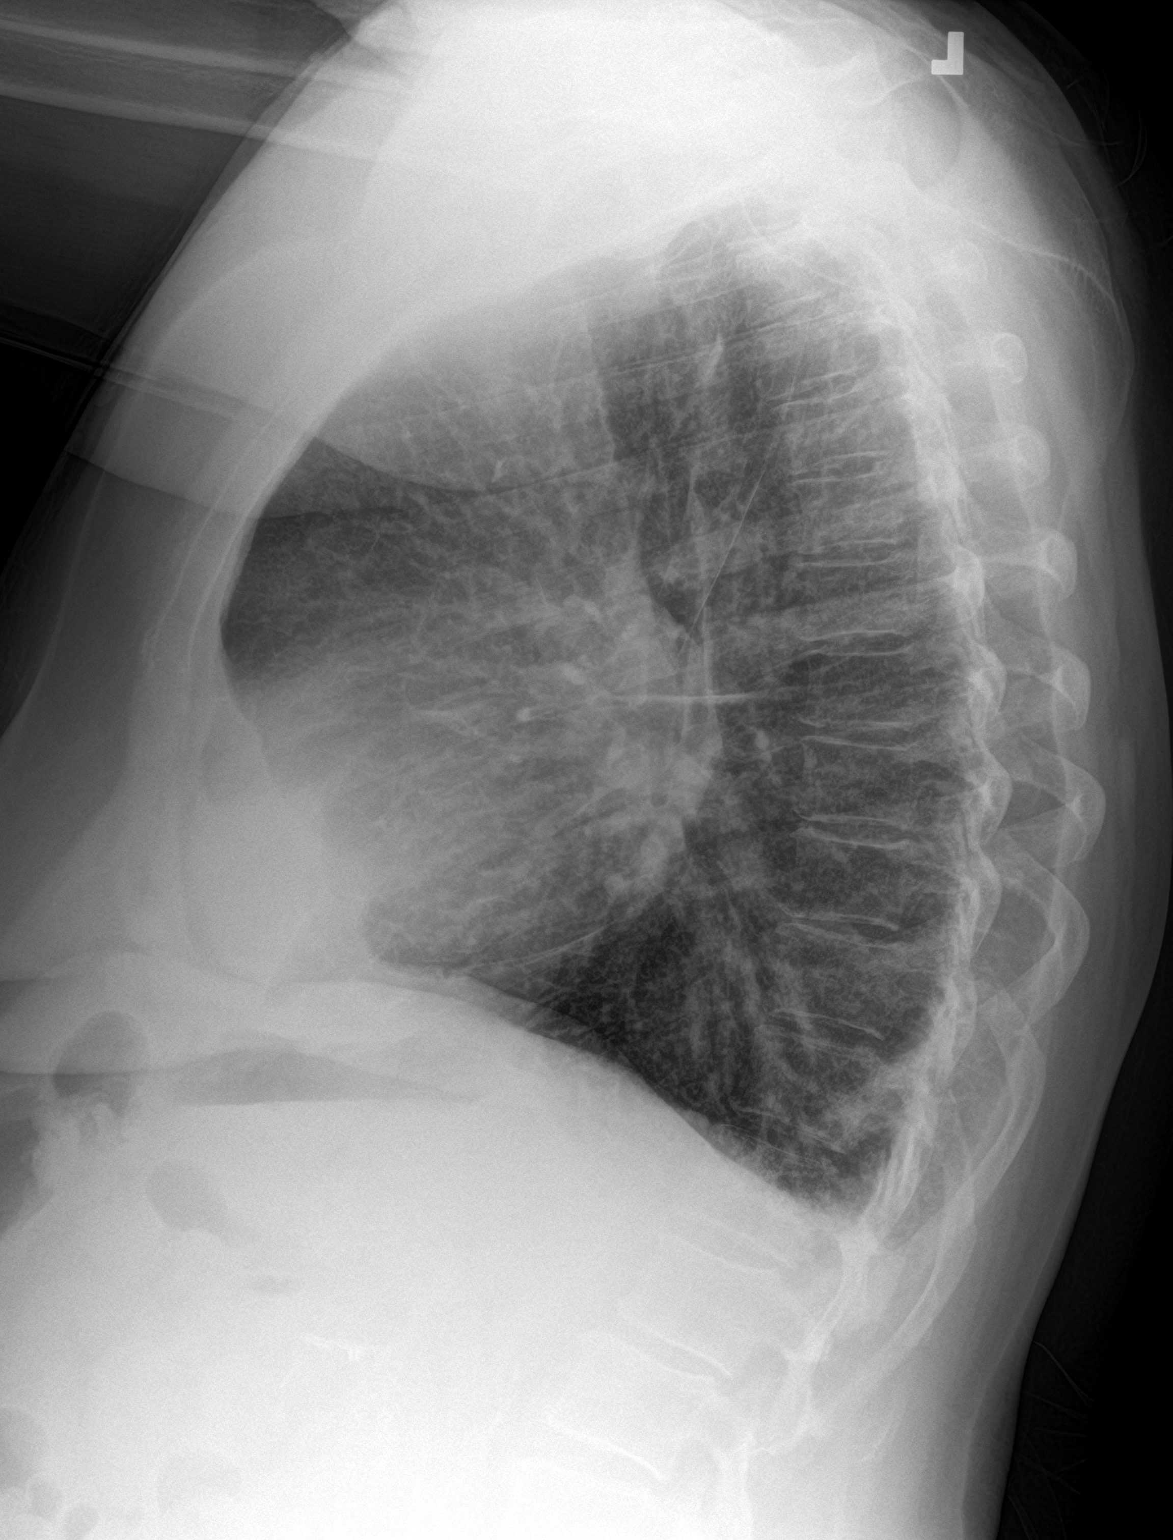

[2 of 2 positions shown; findings below may reference images not displayed]

FINDINGS: Mild diffuse chronic interstitial coarsening which may be chronic or
represent mild edema. Atypical pneumonia is not excluded clinical
correlation is recommended. No focal consolidation, pleural
effusion, pneumothorax. The cardiac silhouette is within limits.
Atherosclerotic calcification of the aorta. No acute osseous
pathology.
IMPRESSION: 1. No focal consolidation.
2. Mild chronic interstitial coarsening.

## 2024-01-09 ENCOUNTER — Ambulatory Visit

## 2024-01-09 ENCOUNTER — Telehealth: Payer: Self-pay

## 2024-01-09 NOTE — Telephone Encounter (Signed)
 Pharmacy please advise on holding warfarin prior to Dental Extraction - Amount of Teeth to be Pulled:  1 TOOTH EXTRACTION, CLEANING, RADIOGRAPHS, FILLINGS, CROWNS, BRIDGES, ROOT CANAL THERAPY, IMPLANT THERAPY  scheduled for TBD. Thank you.

## 2024-01-09 NOTE — Telephone Encounter (Signed)
   Pre-operative Risk Assessment    Patient Name: Rose [name]  DOB: March 27, 1956 MRN: [id number]   Date of last office visit: 12/12/23 [facility] MONGE, NP Date of next office visit: NONE  Request for Surgical Clearance    Procedure:  Dental Extraction - Amount of Teeth to be Pulled:  1 TOOTH EXTRACTION, CLEANING, RADIOGRAPHS, FILLINGS, CROWNS, BRIDGES, ROOT CANAL THERAPY, IMPLANT THERAPY  Date of Surgery:  Clearance TBD                                Surgeon:  NOT INDICATED Surgeon's Group or Practice Name:  [facility] Phone number:  [phone] Fax number:  [phone]   Type of Clearance Requested:   - Medical  - Pharmacy:  Hold Warfarin (Coumadin )     Type of Anesthesia:  Local WITH EPINEPHRINE    Additional requests/questions:    Signed, Lucie DELENA Ku   01/09/2024, 3:08 PM

## 2024-01-09 NOTE — Telephone Encounter (Signed)
 Pt missed coumadin clinic appt. Called pt, no answer. Left message on voicemail.

## 2024-01-10 ENCOUNTER — Other Ambulatory Visit: Payer: Self-pay | Admitting: Cardiology

## 2024-01-10 DIAGNOSIS — Z952 Presence of prosthetic heart valve: Secondary | ICD-10-CM

## 2024-01-10 DIAGNOSIS — I4891 Unspecified atrial fibrillation: Secondary | ICD-10-CM

## 2024-01-10 NOTE — Telephone Encounter (Signed)
 Prescription refill request received for warfarin Lov: 12/12/23 (monge)  Next INR check: 01/09/24 (pt rescheduled for 01/11/24)  Warfarin tablet strength: 2.5mg   Appropriate dose. Refill sent.

## 2024-01-11 ENCOUNTER — Ambulatory Visit: Payer: Self-pay

## 2024-01-11 ENCOUNTER — Ambulatory Visit

## 2024-01-11 NOTE — Telephone Encounter (Signed)
  FYI Only or Action Required?: FYI only for provider.  Patient was last seen in primary care on .  Called Nurse Triage reporting Shortness of Breath.  Symptoms began today.  Interventions attempted: OTC medications: Mucinex .  Symptoms are: unchanged.Pt. States she has COPD, cough and congestion. Will call her PCP.  Triage Disposition: See HCP Within 4 Hours (Or PCP Triage)  Patient/caregiver understands and will follow disposition?: Yes  Copied from CRM [id number]. Topic: Clinical - Red Word Triage >> Jan 11, 2024  9:35 AM Rose [name] wrote: Red Word that prompted transfer to Nurse Triage: After rescheduling pt appointment pt stated she is having a hard time breathing and she is having pain in her back. Reason for Disposition  [1] MILD difficulty breathing (e.g., minimal/no SOB at rest, SOB with walking, pulse < 100) AND [2] NEW-onset or WORSE than normal  Answer Assessment - Initial Assessment Questions 1. RESPIRATORY STATUS: Describe your breathing? (e.g., wheezing, shortness of breath, unable to speak, severe coughing)      SOB 2. ONSET: When did this breathing problem begin?      TODAY 3. PATTERN Does the difficult breathing come and go, or has it been constant since it started?      constant 4. SEVERITY: How bad is your breathing? (e.g., mild, moderate, severe)      moderate 5. RECURRENT SYMPTOM: Have you had difficulty breathing before? If Yes, ask: When was the last time? and What happened that time?      no 6. CARDIAC HISTORY: Do you have any history of heart disease? (e.g., heart attack, angina, bypass surgery, angioplasty)      yes 7. LUNG HISTORY: Do you have any history of lung disease?  (e.g., pulmonary embolus, asthma, emphysema)     COPD 8. CAUSE: What do you think is causing the breathing problem?      unsure 9. OTHER SYMPTOMS: Do you have any other symptoms? (e.g., chest pain, cough, dizziness, fever, runny nose)     Back pain 10. O2  SATURATION MONITOR:  Do you use an oxygen saturation monitor (pulse oximeter) at home? If Yes, ask: What is your reading (oxygen level) today? What is your usual oxygen saturation reading? (e.g., 95%)       no 11. PREGNANCY: Is there any chance you are pregnant? When was your last menstrual period?       no 12. TRAVEL: Have you traveled out of the country in the last month? (e.g., travel history, exposures)       no  Protocols used: Breathing Difficulty-A-AH

## 2024-01-14 ENCOUNTER — Ambulatory Visit (HOSPITAL_COMMUNITY)
Admission: RE | Admit: 2024-01-14 | Discharge: 2024-01-14 | Disposition: A | Discharge status: Home / Self Care | Source: Ambulatory Visit | Attending: Physician Assistant | Admitting: Physician Assistant

## 2024-01-14 ENCOUNTER — Encounter (HOSPITAL_COMMUNITY): Payer: Self-pay | Admitting: Physician Assistant

## 2024-01-14 VITALS — BP 138/84 | HR 91 | Ht 63.0 in | Wt 193.4 lb

## 2024-01-14 DIAGNOSIS — Z5181 Encounter for therapeutic drug level monitoring: Secondary | ICD-10-CM

## 2024-01-14 DIAGNOSIS — Z79899 Other long term (current) drug therapy: Secondary | ICD-10-CM | POA: Diagnosis not present

## 2024-01-14 DIAGNOSIS — I4819 Other persistent atrial fibrillation: Secondary | ICD-10-CM | POA: Diagnosis not present

## 2024-01-14 NOTE — H&P (View-Only) (Signed)
 Primary Care Physician: Arloa Jarvis, NP Primary Cardiologist: Oneil Parchment, MD Electrophysiologist: None  Referring Physician: Damien Braver NP   Rose [name] is a 68 y.o. 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 She has a history of severe mitral valve stenosis, severe TR. She was evaluated by CT surgery and ultimately underwent mitral valve replacement with bioprosthetic mitral valve as well as tricuspid valve repair in 04/2023. She did develop postop atrial fibrillation. She was started on Coumadin . She was hospitalized in February 2025 in the setting of acute on chronic diastolic heart failure, atrial flutter with RVR.  She underwent TEE/DCCV.  She was started on amiodarone  therapy. She was evaluated in the ED in 07/2023 in the setting of shortness of breath, chest pain. EKG showed rate controlled atrial fibrillation. Patient is on warfarin for stroke prevention. Referred to the AF clinic to discuss options.    Patient presents today for follow up for atrial fibrillation. She remains in rate controlled afib today. She does have frequent palpitations. No bleeding issues on anticoagulation.   Today, she denies symptoms of chest pain, orthopnea, PND, lower extremity edema, dizziness, presyncope, syncope, snoring, daytime somnolence, bleeding, or neurologic sequela. The patient is tolerating medications without difficulties and is otherwise without complaint today.    Atrial Fibrillation Risk Factors:  she does have symptoms or diagnosis of sleep apnea. she does have a history of rheumatic fever.   Atrial Fibrillation Management history:  Previous antiarrhythmic drugs: amiodarone   Previous cardioversions: 07/02/23 Previous ablations: none Anticoagulation history: warfarin  ROS- All systems are reviewed and negative  except as per the HPI above.  Past Medical History:  Diagnosis Date   Allergy    Anemia    Anxiety    Aortic atherosclerosis (HCC)    Arthritis    Asthma    Back pain    CAD (coronary artery disease)    Chronic headaches    Class 1 obesity due to excess calories with serious comorbidity in adult    Complication of anesthesia    slow to wake up   COPD (chronic obstructive pulmonary disease) (HCC)    Diabetes (HCC)    Fatty liver    GERD (gastroesophageal reflux disease)    Headache    Herniated lumbar intervertebral disc    High cholesterol    History of blood clots    Lumbago with sciatica, unspecified side    Mitral stenosis    severe by 05/18/21 echo   Moderate episode of recurrent major depressive disorder (HCC)    Nicotine  dependence, cigarettes, with other nicotine -induced disorders    Nonrheumatic aortic (valve) insufficiency    Other chronic pain    Pain in joint involving ankle and foot    Pancreatitis    Pneumonia    Presence urogenital implant    Severe mitral regurgitation    Sleep apnea    no cpap   Stroke (HCC)    mild   SUI (stress urinary incontinence, female)    Tricuspid regurgitation    Type 2 diabetes mellitus with hyperglycemia, with long-term current use of insulin  (HCC)     Current Outpatient Medications  Medication Sig Dispense Refill   acetaminophen  (TYLENOL ) 500 MG tablet Take 500 mg by mouth every 6 (six) hours as needed for mild pain (pain score 1-3) or headache.  albuterol  (VENTOLIN  HFA) 108 (90 Base) MCG/ACT inhaler SMARTSIG:1 Puff(s) Via Inhaler Every 6 Hours PRN     amiodarone  (PACERONE ) 200 MG tablet Take 1 tablet (200 mg total) by mouth 2 (two) times daily. 180 tablet 3   amoxicillin  (AMOXIL ) 500 MG tablet Prophylaxis for dental procedure. Take 2 grams (4x 500mg  tablets) 1 hour before procedure.     clonazePAM  (KLONOPIN ) 1 MG tablet Take 1 mg by mouth 3 (three) times daily.     clotrimazole (MYCELEX) 10 MG troche SMARTSIG:1  Lozenge(s) By Mouth 5 Times Daily     Fluticasone -Umeclidin-Vilant (TRELEGY ELLIPTA) 200-62.5-25 MCG/ACT AEPB Inhale 200 mcg into the lungs daily as needed.     furosemide  (LASIX ) 20 MG tablet Take 2 tablets (40 mg total) by mouth daily. (Patient taking differently: Take 60 mg by mouth daily.) 180 tablet 3   gabapentin  (NEURONTIN ) 800 MG tablet Take 0.5 tablets (400 mg total) by mouth 3 (three) times daily.     metFORMIN  (GLUCOPHAGE ) 500 MG tablet Take 500 mg by mouth 2 (two) times daily.     midodrine  (PROAMATINE ) 10 MG tablet Take 0.5 tablets (5 mg total) by mouth 2 (two) times daily.     omeprazole (PRILOSEC) 40 MG capsule Take 40 mg by mouth daily.     ondansetron  (ZOFRAN ) 4 MG tablet Take 4 mg by mouth every 8 (eight) hours as needed for nausea or vomiting.     OXYGEN Inhale 2 L into the lungs continuous.     Pancrelipase , Lip-Prot-Amyl, (CREON ) 24000-76000 units CPEP Take 2 capsules by mouth 3 (three) times daily after meals.     rOPINIRole  (REQUIP ) 1 MG tablet Take 1 mg by mouth at bedtime.     rosuvastatin  (CRESTOR ) 40 MG tablet Take 1 tablet (40 mg total) by mouth at bedtime. 90 tablet 2   rosuvastatin  (CRESTOR ) 40 MG tablet Take 1 tablet (40 mg total) by mouth at bedtime. 90 tablet 2   sertraline  (ZOLOFT ) 50 MG tablet Take 50 mg by mouth every morning.     traZODone  (DESYREL ) 50 MG tablet Take 150 mg by mouth at bedtime.     warfarin (COUMADIN ) 2.5 MG tablet TAKE 1 TO 1&1/2 TABLETS BY MOUTH EVERY DAY AS DIRECTED BY COUMADIN  CLINIC 40 tablet 1   No current facility-administered medications for this encounter.    Physical Exam: BP 138/84   Pulse 91   Ht 5' 3 (1.6 m)   Wt 87.7 kg   BMI 34.26 kg/m   GEN: Well nourished, well developed in no acute distress NECK: No JVD CARDIAC: Irregularly irregular rate and rhythm, no murmurs, rubs, gallops RESPIRATORY:  Clear to auscultation without rales, wheezing or rhonchi  ABDOMEN: Soft, non-tender, non-distended EXTREMITIES:  No edema;  No deformity   Wt Readings from Last 3 Encounters:  01/14/24 87.7 kg  12/12/23 89.8 kg  11/09/23 88.9 kg     EKG today demonstrates  Atypical atrial flutter with variable block Vent. rate 91 BPM PR interval * ms QRS duration 92 ms QT/QTcB 390/479 ms  Echo 07/01/23 demonstrated   1. Left ventricular ejection fraction, by estimation, is 55 to 60%. Left  ventricular ejection fraction by PLAX is 60 %. The left ventricle has  normal function. The left ventricle has no regional wall motion  abnormalities. Left ventricular diastolic function could not be evaluated.   2. Right ventricular systolic function is moderately reduced. The right  ventricular size is normal. There is mildly elevated pulmonary artery  systolic pressure. The  estimated right ventricular systolic pressure is  39.6 mmHg.   3. The mitral valve has been repaired/replaced. Trivial mitral valve  regurgitation. The mean mitral valve gradient is 6.3 mmHg. There is a 29  mm Mosaic bioprosthetic valve present in the mitral position. Procedure  Date: 05/11/2023. Echo findings are  consistent with normal structure and function of the mitral valve  prosthesis.   4. A small pericardial effusion is present. The pericardial effusion is  posterior to the left ventricle.   5. The aortic valve was not well visualized. Aortic valve regurgitation  is not visualized. Aortic valve sclerosis/calcification is present,  without any evidence of aortic stenosis. Aortic valve mean gradient  measures 7.0 mmHg.   6. The inferior vena cava is normal in size with <50% respiratory  variability, suggesting right atrial pressure of 8 mmHg.   Comparison(s): Changes from prior study are noted. 05/09/2023: LVEF  60-65%, moderate to severe MR - trivial pericardial effusion. Compared to  this study, the mitral valve has been replaced with a bioprothesis.    CHA2DS2-VASc Score = 7  The patient's score is based upon: CHF History: 1 HTN History:  0 Diabetes History: 1 Stroke History: 2 Vascular Disease History: 1 Age Score: 1 Gender Score: 1       ASSESSMENT AND PLAN: Persistent Atrial Fibrillation/atrial flutter (ICD10:  I48.19) The patient's CHA2DS2-VASc score is 7, indicating a 11.2% annual risk of stroke.   Patient in persistent atrial flutter, likely since March. We discussed rhythm control options which are limited. Unlikely ablation candidate with chronic O2 use. Patient has limited transportation and does not think she can have weekly INR checks. Will plan for TEE guided DCCV. Will need observation post DCCV.  Check cmet/TSH/cbc/INR today. If she fails to maintain SR with amiodarone , may be permanent.  Continue amiodarone  200 mg BID for now Continue warfarin Will have her follow up with EP post DCCV to establish care.  Secondary Hypercoagulable State (ICD10:  D68.69) The patient is at significant risk for stroke/thromboembolism based upon her CHA2DS2-VASc Score of 7.  Continue Warfarin (Coumadin ). No bleeding issues.   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     Informed Consent   Shared Decision Making/Informed Consent   The risks [stroke, cardiac arrhythmias rarely resulting in the need for a temporary or permanent pacemaker, skin irritation or burns, esophageal damage, perforation (1:10,000 risk), bleeding, pharyngeal hematoma as well as other potential complications associated with conscious sedation including aspiration, arrhythmia, respiratory failure and death], benefits (treatment guidance, restoration of normal sinus rhythm, diagnostic support) and alternatives of a transesophageal echocardiogram guided cardioversion were discussed in detail with Ms. Scarlett and she is willing to proceed.      Total time of encounter: 65  minutes total time of encounter, including chart review, face-to-face patient care, coordination of care and counseling regarding high complexity medical decision making. We specifically discussed her atrial fibrillation, TEE/DCCV procedure, and options for maintaining rhythm.     [facility] [address] [place], [place] [address] [phone]

## 2024-01-14 NOTE — Patient Instructions (Addendum)
 Call with date for cardioversion - [phone] option 2 - [place]

## 2024-01-14 NOTE — Progress Notes (Signed)
 Primary Care Physician: Arloa Jarvis, NP Primary Cardiologist: Oneil Parchment, MD Electrophysiologist: None  Referring Physician: Damien Braver NP   Rose [name] is a 68 y.o. 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 She has a history of severe mitral valve stenosis, severe TR. She was evaluated by CT surgery and ultimately underwent mitral valve replacement with bioprosthetic mitral valve as well as tricuspid valve repair in 04/2023. She did develop postop atrial fibrillation. She was started on Coumadin . She was hospitalized in February 2025 in the setting of acute on chronic diastolic heart failure, atrial flutter with RVR.  She underwent TEE/DCCV.  She was started on amiodarone  therapy. She was evaluated in the ED in 07/2023 in the setting of shortness of breath, chest pain. EKG showed rate controlled atrial fibrillation. Patient is on warfarin for stroke prevention. Referred to the AF clinic to discuss options.    Patient presents today for follow up for atrial fibrillation. She remains in rate controlled afib today. She does have frequent palpitations. No bleeding issues on anticoagulation.   Today, she denies symptoms of chest pain, orthopnea, PND, lower extremity edema, dizziness, presyncope, syncope, snoring, daytime somnolence, bleeding, or neurologic sequela. The patient is tolerating medications without difficulties and is otherwise without complaint today.    Atrial Fibrillation Risk Factors:  she does have symptoms or diagnosis of sleep apnea. she does have a history of rheumatic fever.   Atrial Fibrillation Management history:  Previous antiarrhythmic drugs: amiodarone   Previous cardioversions: 07/02/23 Previous ablations: none Anticoagulation history: warfarin  ROS- All systems are reviewed and negative  except as per the HPI above.  Past Medical History:  Diagnosis Date   Allergy    Anemia    Anxiety    Aortic atherosclerosis (HCC)    Arthritis    Asthma    Back pain    CAD (coronary artery disease)    Chronic headaches    Class 1 obesity due to excess calories with serious comorbidity in adult    Complication of anesthesia    slow to wake up   COPD (chronic obstructive pulmonary disease) (HCC)    Diabetes (HCC)    Fatty liver    GERD (gastroesophageal reflux disease)    Headache    Herniated lumbar intervertebral disc    High cholesterol    History of blood clots    Lumbago with sciatica, unspecified side    Mitral stenosis    severe by 05/18/21 echo   Moderate episode of recurrent major depressive disorder (HCC)    Nicotine  dependence, cigarettes, with other nicotine -induced disorders    Nonrheumatic aortic (valve) insufficiency    Other chronic pain    Pain in joint involving ankle and foot    Pancreatitis    Pneumonia    Presence urogenital implant    Severe mitral regurgitation    Sleep apnea    no cpap   Stroke (HCC)    mild   SUI (stress urinary incontinence, female)    Tricuspid regurgitation    Type 2 diabetes mellitus with hyperglycemia, with long-term current use of insulin  (HCC)     Current Outpatient Medications  Medication Sig Dispense Refill   acetaminophen  (TYLENOL ) 500 MG tablet Take 500 mg by mouth every 6 (six) hours as needed for mild pain (pain score 1-3) or headache.  albuterol  (VENTOLIN  HFA) 108 (90 Base) MCG/ACT inhaler SMARTSIG:1 Puff(s) Via Inhaler Every 6 Hours PRN     amiodarone  (PACERONE ) 200 MG tablet Take 1 tablet (200 mg total) by mouth 2 (two) times daily. 180 tablet 3   amoxicillin  (AMOXIL ) 500 MG tablet Prophylaxis for dental procedure. Take 2 grams (4x 500mg  tablets) 1 hour before procedure.     clonazePAM  (KLONOPIN ) 1 MG tablet Take 1 mg by mouth 3 (three) times daily.     clotrimazole (MYCELEX) 10 MG troche SMARTSIG:1  Lozenge(s) By Mouth 5 Times Daily     Fluticasone -Umeclidin-Vilant (TRELEGY ELLIPTA) 200-62.5-25 MCG/ACT AEPB Inhale 200 mcg into the lungs daily as needed.     furosemide  (LASIX ) 20 MG tablet Take 2 tablets (40 mg total) by mouth daily. (Patient taking differently: Take 60 mg by mouth daily.) 180 tablet 3   gabapentin  (NEURONTIN ) 800 MG tablet Take 0.5 tablets (400 mg total) by mouth 3 (three) times daily.     metFORMIN  (GLUCOPHAGE ) 500 MG tablet Take 500 mg by mouth 2 (two) times daily.     midodrine  (PROAMATINE ) 10 MG tablet Take 0.5 tablets (5 mg total) by mouth 2 (two) times daily.     omeprazole (PRILOSEC) 40 MG capsule Take 40 mg by mouth daily.     ondansetron  (ZOFRAN ) 4 MG tablet Take 4 mg by mouth every 8 (eight) hours as needed for nausea or vomiting.     OXYGEN Inhale 2 L into the lungs continuous.     Pancrelipase , Lip-Prot-Amyl, (CREON ) 24000-76000 units CPEP Take 2 capsules by mouth 3 (three) times daily after meals.     rOPINIRole  (REQUIP ) 1 MG tablet Take 1 mg by mouth at bedtime.     rosuvastatin  (CRESTOR ) 40 MG tablet Take 1 tablet (40 mg total) by mouth at bedtime. 90 tablet 2   rosuvastatin  (CRESTOR ) 40 MG tablet Take 1 tablet (40 mg total) by mouth at bedtime. 90 tablet 2   sertraline  (ZOLOFT ) 50 MG tablet Take 50 mg by mouth every morning.     traZODone  (DESYREL ) 50 MG tablet Take 150 mg by mouth at bedtime.     warfarin (COUMADIN ) 2.5 MG tablet TAKE 1 TO 1&1/2 TABLETS BY MOUTH EVERY DAY AS DIRECTED BY COUMADIN  CLINIC 40 tablet 1   No current facility-administered medications for this encounter.    Physical Exam: BP 138/84   Pulse 91   Ht 5' 3 (1.6 m)   Wt 87.7 kg   BMI 34.26 kg/m   GEN: Well nourished, well developed in no acute distress NECK: No JVD CARDIAC: Irregularly irregular rate and rhythm, no murmurs, rubs, gallops RESPIRATORY:  Clear to auscultation without rales, wheezing or rhonchi  ABDOMEN: Soft, non-tender, non-distended EXTREMITIES:  No edema;  No deformity   Wt Readings from Last 3 Encounters:  01/14/24 87.7 kg  12/12/23 89.8 kg  11/09/23 88.9 kg     EKG today demonstrates  Atypical atrial flutter with variable block Vent. rate 91 BPM PR interval * ms QRS duration 92 ms QT/QTcB 390/479 ms  Echo 07/01/23 demonstrated   1. Left ventricular ejection fraction, by estimation, is 55 to 60%. Left  ventricular ejection fraction by PLAX is 60 %. The left ventricle has  normal function. The left ventricle has no regional wall motion  abnormalities. Left ventricular diastolic function could not be evaluated.   2. Right ventricular systolic function is moderately reduced. The right  ventricular size is normal. There is mildly elevated pulmonary artery  systolic pressure. The  estimated right ventricular systolic pressure is  39.6 mmHg.   3. The mitral valve has been repaired/replaced. Trivial mitral valve  regurgitation. The mean mitral valve gradient is 6.3 mmHg. There is a 29  mm Mosaic bioprosthetic valve present in the mitral position. Procedure  Date: 05/11/2023. Echo findings are  consistent with normal structure and function of the mitral valve  prosthesis.   4. A small pericardial effusion is present. The pericardial effusion is  posterior to the left ventricle.   5. The aortic valve was not well visualized. Aortic valve regurgitation  is not visualized. Aortic valve sclerosis/calcification is present,  without any evidence of aortic stenosis. Aortic valve mean gradient  measures 7.0 mmHg.   6. The inferior vena cava is normal in size with <50% respiratory  variability, suggesting right atrial pressure of 8 mmHg.   Comparison(s): Changes from prior study are noted. 05/09/2023: LVEF  60-65%, moderate to severe MR - trivial pericardial effusion. Compared to  this study, the mitral valve has been replaced with a bioprothesis.    CHA2DS2-VASc Score = 7  The patient's score is based upon: CHF History: 1 HTN History:  0 Diabetes History: 1 Stroke History: 2 Vascular Disease History: 1 Age Score: 1 Gender Score: 1       ASSESSMENT AND PLAN: Persistent Atrial Fibrillation/atrial flutter (ICD10:  I48.19) The patient's CHA2DS2-VASc score is 7, indicating a 11.2% annual risk of stroke.   Patient in persistent atrial flutter, likely since March. We discussed rhythm control options which are limited. Unlikely ablation candidate with chronic O2 use. Patient has limited transportation and does not think she can have weekly INR checks. Will plan for TEE guided DCCV. Will need observation post DCCV.  Check cmet/TSH/cbc/INR today. If she fails to maintain SR with amiodarone , may be permanent.  Continue amiodarone  200 mg BID for now Continue warfarin Will have her follow up with EP post DCCV to establish care.  Secondary Hypercoagulable State (ICD10:  D68.69) The patient is at significant risk for stroke/thromboembolism based upon her CHA2DS2-VASc Score of 7.  Continue Warfarin (Coumadin ). No bleeding issues.   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     Informed Consent   Shared Decision Making/Informed Consent   The risks [stroke, cardiac arrhythmias rarely resulting in the need for a temporary or permanent pacemaker, skin irritation or burns, esophageal damage, perforation (1:10,000 risk), bleeding, pharyngeal hematoma as well as other potential complications associated with conscious sedation including aspiration, arrhythmia, respiratory failure and death], benefits (treatment guidance, restoration of normal sinus rhythm, diagnostic support) and alternatives of a transesophageal echocardiogram guided cardioversion were discussed in detail with Ms. Scarlett and she is willing to proceed.      Total time of encounter: 65  minutes total time of encounter, including chart review, face-to-face patient care, coordination of care and counseling regarding high complexity medical decision making. We specifically discussed her atrial fibrillation, TEE/DCCV procedure, and options for maintaining rhythm.     [facility] [address] [place], [place] [address] [phone]

## 2024-01-15 ENCOUNTER — Ambulatory Visit (INDEPENDENT_AMBULATORY_CARE_PROVIDER_SITE_OTHER): Admitting: *Deleted

## 2024-01-15 ENCOUNTER — Ambulatory Visit (HOSPITAL_COMMUNITY): Payer: Self-pay | Admitting: Physician Assistant

## 2024-01-15 DIAGNOSIS — Z952 Presence of prosthetic heart valve: Secondary | ICD-10-CM

## 2024-01-15 DIAGNOSIS — I4891 Unspecified atrial fibrillation: Secondary | ICD-10-CM | POA: Diagnosis not present

## 2024-01-15 DIAGNOSIS — Z7901 Long term (current) use of anticoagulants: Secondary | ICD-10-CM

## 2024-01-15 LAB — CBC
Hematocrit: 33.1 % — ABNORMAL LOW (ref 34.0–46.6)
Hemoglobin: 9.6 g/dL — ABNORMAL LOW (ref 11.1–15.9)
MCH: 21.3 pg — ABNORMAL LOW (ref 26.6–33.0)
MCHC: 29 g/dL — ABNORMAL LOW (ref 31.5–35.7)
MCV: 74 fL — ABNORMAL LOW (ref 79–97)
Platelets: 226 x10E3/uL (ref 150–450)
RBC: 4.5 x10E6/uL (ref 3.77–5.28)
RDW: 20.1 % — ABNORMAL HIGH (ref 11.7–15.4)
WBC: 4.9 x10E3/uL (ref 3.4–10.8)

## 2024-01-15 LAB — COMPREHENSIVE METABOLIC PANEL WITH GFR
ALT: 32 IU/L (ref 0–32)
AST: 40 IU/L (ref 0–40)
Albumin: 4.5 g/dL (ref 3.9–4.9)
Alkaline Phosphatase: 128 IU/L — ABNORMAL HIGH (ref 44–121)
BUN/Creatinine Ratio: 9 — ABNORMAL LOW (ref 12–28)
BUN: 16 mg/dL (ref 8–27)
Bilirubin Total: 0.3 mg/dL (ref 0.0–1.2)
CO2: 24 mmol/L (ref 20–29)
Calcium: 9 mg/dL (ref 8.7–10.3)
Chloride: 97 mmol/L (ref 96–106)
Creatinine, Ser: 1.75 mg/dL — ABNORMAL HIGH (ref 0.57–1.00)
Globulin, Total: 2.5 g/dL (ref 1.5–4.5)
Glucose: 109 mg/dL — ABNORMAL HIGH (ref 70–99)
Potassium: 3.6 mmol/L (ref 3.5–5.2)
Sodium: 140 mmol/L (ref 134–144)
Total Protein: 7 g/dL (ref 6.0–8.5)
eGFR: 32 mL/min/1.73 — ABNORMAL LOW

## 2024-01-15 LAB — PROTIME-INR
INR: 2.7 — ABNORMAL HIGH (ref 0.9–1.2)
Prothrombin Time: 27.3 s — ABNORMAL HIGH (ref 9.1–12.0)

## 2024-01-15 LAB — TSH: TSH: 5.06 u[IU]/mL — ABNORMAL HIGH (ref 0.450–4.500)

## 2024-01-15 NOTE — Patient Instructions (Addendum)
 Description   Spoke with patient and advised to continue taking 1.5 tablets daily EXCEPT 1 tablet on Tuesday, Thursday, and Saturday.  Recheck INR 1 week post TEE/DCCV.  Coumadin  clinic [phone]

## 2024-01-15 NOTE — Progress Notes (Signed)
 INR-2.7; Spoke with patient and advised to continue taking 1.5 tablets daily EXCEPT 1 tablet on Tuesday, Thursday, and Saturday.  Recheck INR 1 week post TEE/DCCV.  Coumadin  clinic [phone]

## 2024-01-16 NOTE — Telephone Encounter (Signed)
 Please clarify with dentist for pharmacy recommendations.

## 2024-01-16 NOTE — Telephone Encounter (Signed)
   Patient Name: Rose [name]  DOB: 1955/07/06 MRN: [id number]  Primary Cardiologist: Oneil Parchment, MD  Chart reviewed as part of pre-operative protocol coverage.   IF SIMPLE EXTRACTION/CLEANINGS: Simple dental extractions (i.e. 1-2 teeth) are considered low risk procedures per guidelines and generally do not require any specific cardiac clearance. It is also generally accepted that for simple extractions and dental cleanings, there is no need to interrupt blood thinner therapy.  SBE prophylaxis is required for the patient from a cardiac standpoint. Patient has Rx for Amoxicillin  1 gm to be taken prior to dental procedures.   I will route this recommendation to the requesting party via Epic fax function and remove from pre-op pool.  Please call with questions.  Lamarr Satterfield, NP 01/16/2024, 2:16 PM

## 2024-01-16 NOTE — Telephone Encounter (Signed)
 S/w the DDS office and clarified procedure to be done at this time will be:   1 TOOTH EXTRACTION-SURGICAL  DR. MANCEL STAKES, DDS

## 2024-01-16 NOTE — Telephone Encounter (Signed)
 Is this a blanket request? We need to know exactly what they are doing

## 2024-01-17 ENCOUNTER — Other Ambulatory Visit (HOSPITAL_COMMUNITY): Payer: Self-pay | Admitting: *Deleted

## 2024-01-17 ENCOUNTER — Telehealth: Payer: Self-pay | Admitting: Cardiology

## 2024-01-17 ENCOUNTER — Encounter

## 2024-01-17 DIAGNOSIS — I4819 Other persistent atrial fibrillation: Secondary | ICD-10-CM

## 2024-01-17 NOTE — Telephone Encounter (Signed)
Patient calling to speak to the nurse. Please advise  

## 2024-01-17 NOTE — Telephone Encounter (Signed)
 Patient identification verified by 2 forms.   Called and spoke to patient  Patient states:  -I was calling to see when they are going to jumpstart my heart.              Interventions/Plan: -Number given to pt to call to get cardioversion scheduled.   Patient agrees with plan, no questions at this time

## 2024-01-18 ENCOUNTER — Ambulatory Visit (HOSPITAL_COMMUNITY)
Admission: RE | Admit: 2024-01-18 | Discharge: 2024-01-18 | Disposition: A | Discharge status: Home / Self Care | Attending: Internal Medicine | Admitting: Internal Medicine

## 2024-01-18 ENCOUNTER — Encounter (HOSPITAL_COMMUNITY): Payer: Self-pay | Admitting: Internal Medicine

## 2024-01-18 ENCOUNTER — Ambulatory Visit (HOSPITAL_BASED_OUTPATIENT_CLINIC_OR_DEPARTMENT_OTHER)
Admission: RE | Admit: 2024-01-18 | Discharge: 2024-01-18 | Disposition: A | Discharge status: Home / Self Care | Source: Ambulatory Visit | Attending: Physician Assistant | Admitting: Physician Assistant

## 2024-01-18 ENCOUNTER — Ambulatory Visit (HOSPITAL_COMMUNITY): Admitting: Anesthesiology

## 2024-01-18 ENCOUNTER — Encounter (HOSPITAL_COMMUNITY)
Admission: RE | Disposition: A | Discharge status: Home / Self Care | Payer: Self-pay | Source: Home / Self Care | Attending: Internal Medicine

## 2024-01-18 ENCOUNTER — Other Ambulatory Visit: Payer: Self-pay

## 2024-01-18 ENCOUNTER — Telehealth: Payer: Self-pay | Admitting: Cardiology

## 2024-01-18 DIAGNOSIS — I4819 Other persistent atrial fibrillation: Secondary | ICD-10-CM | POA: Insufficient documentation

## 2024-01-18 DIAGNOSIS — F17210 Nicotine dependence, cigarettes, uncomplicated: Secondary | ICD-10-CM

## 2024-01-18 DIAGNOSIS — D6869 Other thrombophilia: Secondary | ICD-10-CM | POA: Insufficient documentation

## 2024-01-18 DIAGNOSIS — G4733 Obstructive sleep apnea (adult) (pediatric): Secondary | ICD-10-CM | POA: Insufficient documentation

## 2024-01-18 DIAGNOSIS — I071 Rheumatic tricuspid insufficiency: Secondary | ICD-10-CM | POA: Insufficient documentation

## 2024-01-18 DIAGNOSIS — F331 Major depressive disorder, recurrent, moderate: Secondary | ICD-10-CM | POA: Insufficient documentation

## 2024-01-18 DIAGNOSIS — I5032 Chronic diastolic (congestive) heart failure: Secondary | ICD-10-CM | POA: Insufficient documentation

## 2024-01-18 DIAGNOSIS — J449 Chronic obstructive pulmonary disease, unspecified: Secondary | ICD-10-CM | POA: Insufficient documentation

## 2024-01-18 DIAGNOSIS — Z953 Presence of xenogenic heart valve: Secondary | ICD-10-CM | POA: Insufficient documentation

## 2024-01-18 DIAGNOSIS — E119 Type 2 diabetes mellitus without complications: Secondary | ICD-10-CM | POA: Insufficient documentation

## 2024-01-18 DIAGNOSIS — E785 Hyperlipidemia, unspecified: Secondary | ICD-10-CM | POA: Insufficient documentation

## 2024-01-18 DIAGNOSIS — Z7901 Long term (current) use of anticoagulants: Secondary | ICD-10-CM | POA: Insufficient documentation

## 2024-01-18 DIAGNOSIS — K219 Gastro-esophageal reflux disease without esophagitis: Secondary | ICD-10-CM | POA: Diagnosis not present

## 2024-01-18 DIAGNOSIS — Z8673 Personal history of transient ischemic attack (TIA), and cerebral infarction without residual deficits: Secondary | ICD-10-CM | POA: Insufficient documentation

## 2024-01-18 DIAGNOSIS — I2510 Atherosclerotic heart disease of native coronary artery without angina pectoris: Secondary | ICD-10-CM

## 2024-01-18 DIAGNOSIS — I4892 Unspecified atrial flutter: Secondary | ICD-10-CM | POA: Insufficient documentation

## 2024-01-18 DIAGNOSIS — Z7984 Long term (current) use of oral hypoglycemic drugs: Secondary | ICD-10-CM | POA: Diagnosis not present

## 2024-01-18 DIAGNOSIS — I4891 Unspecified atrial fibrillation: Secondary | ICD-10-CM | POA: Diagnosis not present

## 2024-01-18 DIAGNOSIS — Z79899 Other long term (current) drug therapy: Secondary | ICD-10-CM | POA: Insufficient documentation

## 2024-01-18 HISTORY — PX: CARDIOVERSION: EP1203

## 2024-01-18 HISTORY — PX: TRANSESOPHAGEAL ECHOCARDIOGRAM (CATH LAB): EP1270

## 2024-01-18 LAB — ECHO TEE

## 2024-01-18 LAB — GLUCOSE, CAPILLARY: Glucose-Capillary: 127 mg/dL — ABNORMAL HIGH (ref 70–99)

## 2024-01-18 LAB — PROTIME-INR
INR: 2.6 — ABNORMAL HIGH (ref 0.8–1.2)
Prothrombin Time: 28.9 s — ABNORMAL HIGH (ref 11.4–15.2)

## 2024-01-18 SURGERY — TRANSESOPHAGEAL ECHOCARDIOGRAM (TEE) (CATHLAB)
Anesthesia: Monitor Anesthesia Care

## 2024-01-18 MED ORDER — ALBUTEROL SULFATE (2.5 MG/3ML) 0.083% IN NEBU: 2.5000 mg | INHALATION_SOLUTION | Freq: Once | RESPIRATORY_TRACT | Status: AC

## 2024-01-18 MED ORDER — AMIODARONE HCL 200 MG PO TABS
200.0000 mg | ORAL_TABLET | Freq: Once | ORAL | Status: AC
  Administered 2024-01-18: 200 mg via ORAL
  Filled 2024-01-18: qty 1

## 2024-01-18 MED ORDER — WARFARIN - PHYSICIAN DOSING INPATIENT: Freq: Every day | Status: DC

## 2024-01-18 MED ORDER — LIDOCAINE 2% (20 MG/ML) 5 ML SYRINGE
INTRAMUSCULAR | Status: DC | PRN
  Administered 2024-01-18: 100 mg via INTRAVENOUS

## 2024-01-18 MED ORDER — AMIODARONE HCL 200 MG PO TABS: 200.0000 mg | ORAL_TABLET | Freq: Every day | ORAL | Status: DC

## 2024-01-18 MED ORDER — WARFARIN SODIUM 2.5 MG PO TABS
3.7500 mg | ORAL_TABLET | Freq: Once | ORAL | Status: AC
  Administered 2024-01-18: 3.75 mg via ORAL
  Filled 2024-01-18: qty 1

## 2024-01-18 MED ORDER — ALBUTEROL SULFATE (2.5 MG/3ML) 0.083% IN NEBU
INHALATION_SOLUTION | RESPIRATORY_TRACT | Status: AC
  Administered 2024-01-18: 2.5 mg via RESPIRATORY_TRACT
  Filled 2024-01-18: qty 3

## 2024-01-18 MED ORDER — PROPOFOL 10 MG/ML IV BOLUS
INTRAVENOUS | Status: DC | PRN
  Administered 2024-01-18: 20 mg via INTRAVENOUS
  Administered 2024-01-18: 30 mg via INTRAVENOUS

## 2024-01-18 MED ORDER — PROPOFOL 500 MG/50ML IV EMUL
INTRAVENOUS | Status: DC | PRN
  Administered 2024-01-18: 75 ug/kg/min via INTRAVENOUS

## 2024-01-18 MED ORDER — SODIUM CHLORIDE 0.9 % IV SOLN: INTRAVENOUS | Status: DC

## 2024-01-18 SURGICAL SUPPLY — 1 items: PAD DEFIB RADIO PHYSIO CONN (PAD) ×1 IMPLANT

## 2024-01-18 NOTE — Transfer of Care (Signed)
 Immediate Anesthesia Transfer of Care Note  Patient: Rose [name]  Procedure(s) Performed: TRANSESOPHAGEAL ECHOCARDIOGRAM CARDIOVERSION  Patient Location: PACU  Anesthesia Type:MAC  Level of Consciousness: awake, alert , and oriented  Airway & Oxygen Therapy: Patient Spontanous Breathing and Patient connected to face mask oxygen  Post-op Assessment: Report given to RN and Post -op Vital signs reviewed and stable  Post vital signs: Reviewed and stable  Last Vitals:  Vitals Value Taken Time  BP 102/55 01/18/24 13:15  Temp 36.6 C 01/18/24 13:13  Pulse 69 01/18/24 13:15  Resp 15 01/18/24 13:15  SpO2 99 % 01/18/24 13:15  Vitals shown include unfiled device data.  Last Pain:  Vitals:   01/18/24 1313  TempSrc: Tympanic  PainSc: Asleep         Complications: No notable events documented.

## 2024-01-18 NOTE — Progress Notes (Addendum)
  Echocardiogram Transesophageal echocardiogram has been performed.  Rose [name] 01/18/2024, 1:23 PM

## 2024-01-18 NOTE — Anesthesia Preprocedure Evaluation (Addendum)
 Anesthesia Evaluation  Patient identified by MRN, date of birth, ID band Patient awake    Reviewed: Allergy & Precautions, NPO status , Patient's Chart, lab work & pertinent test results  History of Anesthesia Complications Negative for: history of anesthetic complications  Airway Mallampati: II  TM Distance: >3 FB Neck ROM: Full    Dental  (+) Dental Advisory Given, Missing, Loose, Poor Dentition   Pulmonary asthma , sleep apnea and Oxygen sleep apnea , COPD,  COPD inhaler and oxygen dependent, Current SmokerPatient did not abstain from smoking.    + wheezing      Cardiovascular + CAD  + dysrhythmias Atrial Fibrillation + Valvular Problems/Murmurs (s/p MVR 2024)  Rhythm:Irregular Rate:Normal     Neuro/Psych  Headaches  Anxiety Depression    CVA    GI/Hepatic ,GERD  Medicated and Controlled,,  Endo/Other  diabetes, Oral Hypoglycemic Agents  BMI 34  Renal/GU      Musculoskeletal  (+) Arthritis ,    Abdominal   Peds  Hematology  (+) Blood dyscrasia, anemia Hb 9.8, plt 226 Coumadin    Anesthesia Other Findings   Reproductive/Obstetrics                              Anesthesia Physical Anesthesia Plan  ASA: 3  Anesthesia Plan: MAC   Post-op Pain Management: Minimal or no pain anticipated   Induction:   PONV Risk Score and Plan: 1 and Treatment may vary due to age or medical condition  Airway Management Planned: Natural Airway and Simple Face Mask  Additional Equipment: None  Intra-op Plan:   Post-operative Plan:   Informed Consent: I have reviewed the patients History and Physical, chart, labs and discussed the procedure including the risks, benefits and alternatives for the proposed anesthesia with the patient or authorized representative who has indicated his/her understanding and acceptance.     Dental advisory given  Plan Discussed with: CRNA and Surgeon  Anesthesia  Plan Comments:         Anesthesia Quick Evaluation

## 2024-01-18 NOTE — Telephone Encounter (Signed)
 Called and made patient aware that per Dr. Santo continue current mediations as directed. no changes at our procedure today. Understanding verbalized.

## 2024-01-18 NOTE — Telephone Encounter (Signed)
 Called and advise patient to look at after summary for direction on when to resume  medications. Made patient aware that this will be forward to provider for advise when to resume medications. Patient request referral to dermatology for legs. Advise patient to follow up with PCP. Understanding verbalized

## 2024-01-18 NOTE — Discharge Instructions (Signed)
Electrical Cardioversion Electrical cardioversion is the delivery of a jolt of electricity to restore a normal rhythm to the heart. A rhythm that is too fast or is not regular keeps the heart from pumping well. In this procedure, sticky patches or metal paddles are placed on the chest to deliver electricity to the heart from a device. This procedure may be done in an emergency if: There is low or no blood pressure as a result of the heart rhythm. Normal rhythm must be restored as fast as possible to protect the brain and heart from further damage. It may save a life. This may also be a scheduled procedure for irregular or fast heart rhythms that are not immediately life-threatening.  What can I expect after the procedure? Your blood pressure, heart rate, breathing rate, and blood oxygen level will be monitored until you leave the hospital or clinic. Your heart rhythm will be watched to make sure it does not change. You may have some redness on the skin where the shocks were given. Over the counter cortizone cream may be helpful.  Follow these instructions at home: Do not drive for 24 hours if you were given a sedative during your procedure. Take over-the-counter and prescription medicines only as told by your health care provider. Ask your health care provider how to check your pulse. Check it often. Rest for 48 hours after the procedure or as told by your health care provider. Avoid or limit your caffeine use as told by your health care provider. Keep all follow-up visits as told by your health care provider. This is important. Contact a health care provider if: You feel like your heart is beating too quickly or your pulse is not regular. You have a serious muscle cramp that does not go away. Get help right away if: You have discomfort in your chest. You are dizzy or you feel faint. You have trouble breathing or you are short of breath. Your speech is slurred. You have trouble moving an  arm or leg on one side of your body. Your fingers or toes turn cold or blue. Summary Electrical cardioversion is the delivery of a jolt of electricity to restore a normal rhythm to the heart. This procedure may be done right away in an emergency or may be a scheduled procedure if the condition is not an emergency. Generally, this is a safe procedure. After the procedure, check your pulse often as told by your health care provider. This information is not intended to replace advice given to you by your health care provider. Make sure you discuss any questions you have with your health care provider. Document Revised: 12/16/2018 Document Reviewed: 12/16/2018 Elsevier Patient Education  2020 [employer] TEE  YOU HAD AN CARDIAC PROCEDURE TODAY: Refer to the procedure report and other information in the discharge instructions given to you for any specific questions about what was found during the examination. If this information does not answer your questions, please call [facility] office at [phone] to clarify.   DIET: Your first meal following the procedure should be a light meal and then it is ok to progress to your normal diet. A half-sandwich or bowl of soup is an example of a good first meal. Heavy or fried foods are harder to digest and may make you feel nauseous or bloated. Drink plenty of fluids but you should avoid alcoholic beverages for 24 hours. If you had a esophageal dilation, please see attached instructions for diet.   ACTIVITY: Your   care partner should take you home directly after the procedure. You should plan to take it easy, moving slowly for the rest of the day. You can resume normal activity the day after the procedure however YOU SHOULD NOT DRIVE, use power tools, machinery or perform tasks that involve climbing or major physical exertion for 24 hours (because of the sedation medicines used during the test).   SYMPTOMS TO REPORT IMMEDIATELY: A cardiologist can be  reached at any hour. Please call [phone] for any of the following symptoms:  Vomiting of blood or coffee ground material  New, significant abdominal pain  New, significant chest pain or pain under the shoulder blades  Painful or persistently difficult swallowing  New shortness of breath  Black, tarry-looking or red, bloody stools  FOLLOW UP:  Please also call with any specific questions about appointments or follow up tests.  

## 2024-01-18 NOTE — Telephone Encounter (Signed)
 Patient would like to know if she is ok to continue meds post DCCV.

## 2024-01-18 NOTE — CV Procedure (Signed)
   TRANSESOPHAGEAL ECHOCARDIOGRAM GUIDED DIRECT CURRENT CARDIOVERSION  NAME:  Rose [name]    MRN: [id number] DOB:  Sep 18, 1955    ADMIT DATE: 01/18/2024  INDICATIONS: Symptomatic atrial fibrillation  PROCEDURE:   Informed consent was obtained prior to the procedure. The risks, benefits and alternatives for the procedure were discussed and the patient comprehended these risks.  Risks include, but are not limited to, cough, sore throat, vomiting, nausea, somnolence, esophageal and stomach trauma or perforation, bleeding, low blood pressure, aspiration, pneumonia, infection, trauma to the teeth and death.    After a procedural time-out, the oropharynx was anesthetized and the patient was sedated by the anesthesia service. The transesophageal probe was inserted in the esophagus and stomach without difficulty and multiple views were obtained. Anesthesia was monitored by Dr. Jacosn.   COMPLICATIONS:    Complications: No complications Patient tolerated procedure well.  KEY FINDINGS:  Severe Tricuspid regurgitation (functional) Normally functioning mitral prosthesis. Patent left atrial appendage. Full Report to follow.   CARDIOVERSION:     Indications:  Symptomatic Atrial Fibrillation  Procedure Details:  Once the TEE was complete, the patient had the defibrillator pads placed in the anterior and posterior position. Once an appropriate level of sedation was confirmed, the patient was cardioverted x 1 with 200J of biphasic synchronized energy.  The patient converted to NSR.  There were no apparent complications.  The patient had normal neuro status and respiratory status post procedure with vitals stable as recorded elsewhere.  Adequate airway was maintained throughout and vital signs monitored per protocol.  Stanly Leavens, MD [place]  [facility]  1:10 PM

## 2024-01-18 NOTE — Interval H&P Note (Signed)
 History and Physical Interval Note:  01/18/2024 11:18 AM  Rose [name]  has presented today for surgery, with the diagnosis of AFIB.  The various methods of treatment have been discussed with the patient and family. After consideration of risks, benefits and other options for treatment, the patient has consented to  Procedure(s): TRANSESOPHAGEAL ECHOCARDIOGRAM (N/A) CARDIOVERSION (N/A) as a surgical intervention.  The patient's history has been reviewed, patient examined, no change in status, stable for surgery.  I have reviewed the patient's chart and labs.  Questions were answered to the patient's satisfaction.    Overall, this is a procedure to attempt to restore sinus rhythm using amiodarone  and procedural intervention. In the setting of shortness of breath and bilateral wheezes with long smoking history  will collaborate with our team.  If she can safely undergo anesthesia and INR is normal, we will proceed with procedure.  If not we will defer at this time.  Discussed smoking cessation at length.   Rose [name] A Rose [name]

## 2024-01-18 NOTE — Anesthesia Postprocedure Evaluation (Signed)
 Anesthesia Post Note  Patient: Rose [name]  Procedure(s) Performed: TRANSESOPHAGEAL ECHOCARDIOGRAM CARDIOVERSION     Patient location during evaluation: Cath Lab Anesthesia Type: MAC Level of consciousness: awake and alert, oriented and patient cooperative Pain management: pain level controlled Vital Signs Assessment: post-procedure vital signs reviewed and stable Respiratory status: spontaneous breathing, nonlabored ventilation, respiratory function stable and patient connected to nasal cannula oxygen Cardiovascular status: blood pressure returned to baseline and stable Postop Assessment: no apparent nausea or vomiting Anesthetic complications: no   No notable events documented.  Last Vitals:  Vitals:   01/18/24 1323 01/18/24 1333  BP: (!) 102/55 (!) 118/56  Pulse: 72 81  Resp: 16 15  Temp:    SpO2: 97% 95%    Last Pain:  Vitals:   01/18/24 1333  TempSrc:   PainSc: 0-No pain                 Chiamaka Latka,E. Casady Voshell

## 2024-01-19 ENCOUNTER — Encounter (HOSPITAL_COMMUNITY): Payer: Self-pay | Admitting: Internal Medicine

## 2024-01-24 ENCOUNTER — Ambulatory Visit: Admitting: Emergency Medicine

## 2024-01-24 ENCOUNTER — Encounter: Payer: Self-pay | Admitting: Emergency Medicine

## 2024-01-24 VITALS — BP 117/76 | HR 69 | Ht 63.0 in | Wt 190.0 lb

## 2024-01-24 DIAGNOSIS — J9611 Chronic respiratory failure with hypoxia: Secondary | ICD-10-CM | POA: Diagnosis not present

## 2024-01-24 DIAGNOSIS — J449 Chronic obstructive pulmonary disease, unspecified: Secondary | ICD-10-CM | POA: Diagnosis not present

## 2024-01-24 DIAGNOSIS — F17210 Nicotine dependence, cigarettes, uncomplicated: Secondary | ICD-10-CM | POA: Diagnosis not present

## 2024-01-24 DIAGNOSIS — Z7689 Persons encountering health services in other specified circumstances: Secondary | ICD-10-CM

## 2024-01-24 MED ORDER — STIOLTO RESPIMAT 2.5-2.5 MCG/ACT IN AERS
2.0000 | INHALATION_SPRAY | Freq: Every day | RESPIRATORY_TRACT | 11 refills | Status: DC
Start: 2024-01-24 — End: 2024-03-26

## 2024-01-24 MED ORDER — DOXYCYCLINE HYCLATE 100 MG PO TABS: 100.0000 mg | ORAL_TABLET | Freq: Two times a day (BID) | ORAL | 0 refills | Status: DC

## 2024-01-24 NOTE — Progress Notes (Addendum)
 Subjective:    Patient ID: Rose [name], female    DOB: [birth year], 68 y.o.   MRN: [id number]  HPI 68 year old active smoker (47 pack years) with a history of COPD, diabetes, DVT, CVA, mitral regurg.  She is referred back today for evaluation of dyspnea, COPD. She reports progression of resting and exertional dyspnea. She has increased mucous production, daily cough with yellow brown phlegm. She has daily CP - seems to come and go, not necessarily assoc w cough or exertion.  She was in the ED in August with an apparent acute exacerbation of her COPD, treated with corticosteroids.  COVID-19 was negative at that time. She has been on Anoro, Advair in the past but not currently. Sounds like she has been on Breztri before as well. She is unclear whether she got any benefit. She was formerly on O2, but somehow came off of it. She has an old nebulizer, but no meds. Still smoking about 0.5 pk/day.   PFT 05/15/2016 reviewed by me, shows mild obstruction without a bronchodilator response, normal lung volumes, decreased diffusion capacity  LDCT 5//9/22 -- RADS 2, moderate centrilobular emphysema, anteromedial right middle lobe and lingular scarring, multiple stable nodules, largest 5 mm in the right middle lobe.  ROV 01/24/2024 --68 year old woman with a history of tobacco use (50 pack years), associated COPD.  Also with a history of mitral regurgitation, diabetes, DVT, MVR in 2024.  More recently has been dealing with atrial fibrillation..  I last saw her in 2022.  She has documented obstruction on pulmonary function testing from 2017. Today she reports that she has had more cough for about 6 months, yellowish-brown. She has stable mild SOB. She has some wheeze - especially at night. She was started on O2 since I last saw her, has a POC, on 3L/mion  Lung cancer screening CT chest 10/16/2023 reviewed by me shows Emphysema with some bronchial wall thickening, right lower lobe subsegmental atelectasis versus  scar, multiple calcified granulomas.  Stable previously notified solid pulmonary nodules.  There were nonsolid nodules that have either resolved or decreased in prominence included the fate groundglass nodule in the right upper lobe 6.7 mm, new subpleural right middle lobe nodule 5.5 mm.   Review of Systems As per HPI  Past Medical History:  Diagnosis Date   Allergy    Anemia    Anxiety    Aortic atherosclerosis (HCC)    Arthritis    Asthma    Back pain    CAD (coronary artery disease)    Chronic headaches    Class 1 obesity due to excess calories with serious comorbidity in adult    Complication of anesthesia    slow to wake up   COPD (chronic obstructive pulmonary disease) (HCC)    Diabetes (HCC)    Fatty liver    GERD (gastroesophageal reflux disease)    Headache    Herniated lumbar intervertebral disc    High cholesterol    History of blood clots    Lumbago with sciatica, unspecified side    Mitral stenosis    severe by 05/18/21 echo   Moderate episode of recurrent major depressive disorder (HCC)    Nicotine  dependence, cigarettes, with other nicotine -induced disorders    Nonrheumatic aortic (valve) insufficiency    Other chronic pain    Pain in joint involving ankle and foot    Pancreatitis    Pneumonia    Presence urogenital implant    Severe mitral regurgitation    Sleep apnea  no cpap   Stroke (HCC)    mild   SUI (stress urinary incontinence, female)    Tricuspid regurgitation    Type 2 diabetes mellitus with hyperglycemia, with long-term current use of insulin  (HCC)      Family History  Problem Relation Age of Onset   Emphysema Mother    Heart disease Mother    Cancer Mother    Heart disease Father    Cancer Sister     Family Hx of lung CA. Parents and sister.   Social History   Socioeconomic History   Marital status: Single    Spouse name: Not on file   Number of children: Not on file   Years of education: Not on file   Highest education  level: Not on file  Occupational History   Not on file  Tobacco Use   Smoking status: Every Day    Current packs/day: 0.50    Average packs/day: 0.5 packs/day for 47.0 years (23.5 ttl pk-yrs)    Types: Cigarettes   Smokeless tobacco: Never   Tobacco comments:    0.5 packs smoked daily. ARJ 03/29/21    Patient states she is not ready to give up smoking.   Vaping Use   Vaping status: Former   Substances: Nicotine , Flavoring  Substance and Sexual Activity   Alcohol use: No    Alcohol/week: 0.0 standard drinks of alcohol   Drug use: Not Currently    Types: Marijuana   Sexual activity: Not Currently  Other Topics Concern   Not on file  Social History Narrative   Lives in an apartment with her sister Janna. Patient states Janna is selling her Xanax and that she will not leave her medication at the house when she leaves. Patient carries her meds with her at all times.  Ms Brocksmith stated that she is in process of being evicted from her apartment d/t complaints against her for harassing other tenants. Patient states she is working with [employer] to find a place to live but they will not help her. Patient stated she has court on Dec 17th for the eviction but she will not be there as her surgery is dec 13 so she hopes that she has a place to live when she gets discharged.           Patient also states she has to use [employer] to arrange transportation for her to get to all appointments.  Patient states that she uses [facility] but needs refills on her medications.           The patients main concern is her dog who lives with her and that she needs to have someone stay with the dog at all times.     Social Drivers of Health   Financial Resource Strain: Medium Risk (01/25/2023)   Overall Financial Resource Strain (CARDIA)    Difficulty of Paying Living Expenses: Somewhat hard  Food Insecurity: Food Insecurity Present (07/02/2023)   Hunger Vital Sign    Worried About  Running Out of Food in the Last Year: Sometimes true    Ran Out of Food in the Last Year: Sometimes true  Transportation Needs: No Transportation Needs (07/02/2023)   PRAPARE - Administrator, Civil Service (Medical): No    Lack of Transportation (Non-Medical): No  Physical Activity: Not on file  Stress: Not on file  Social Connections: Socially Isolated (07/02/2023)   Social Connection and Isolation Panel    Frequency of  Communication with Friends and Family: Never    Frequency of Social Gatherings with Friends and Family: Never    Attends Religious Services: Never    Database administrator or Organizations: No    Attends Banker Meetings: Never    Marital Status: Never married  Intimate Partner Violence: Not At Risk (07/02/2023)   Humiliation, Afraid, Rape, and Kick questionnaire    Fear of Current or Ex-Partner: No    Emotionally Abused: No    Physically Abused: No    Sexually Abused: No     Allergies  Allergen Reactions   Erythromycin Shortness Of Breath   Nickel Rash and Shortness Of Breath   Oxycodone  Shortness Of Breath   Meperidine Other (See Comments)    Confusion and tingling  (noted in H&P)    Nicotine  Hives and Itching   Codeine Swelling   Hydrocodone -Acetaminophen  Itching    Tolerated Norco 11/2018   Morphine Itching   Propoxyphene Other (See Comments)    Ineffective    Quetiapine     Restless leg syndrome     Outpatient Medications Prior to Visit  Medication Sig Dispense Refill   albuterol  (VENTOLIN  HFA) 108 (90 Base) MCG/ACT inhaler Inhale 1-2 puffs into the lungs every 6 (six) hours as needed for wheezing or shortness of breath.     amiodarone  (PACERONE ) 200 MG tablet Take 1 tablet (200 mg total) by mouth 2 (two) times daily. 180 tablet 3   amoxicillin  (AMOXIL ) 500 MG tablet Prophylaxis for dental procedure. Take 2 grams (4x 500mg  tablets) 1 hour before procedure.     clonazePAM  (KLONOPIN ) 1 MG tablet Take 1 mg by mouth 3 (three) times  daily.     clotrimazole (MYCELEX) 10 MG troche Take 10 mg by mouth 4 (four) times daily as needed.     furosemide  (LASIX ) 20 MG tablet Take 2 tablets (40 mg total) by mouth daily. (Patient taking differently: Take 60 mg by mouth in the morning.) 180 tablet 3   gabapentin  (NEURONTIN ) 800 MG tablet Take 0.5 tablets (400 mg total) by mouth 3 (three) times daily. (Patient taking differently: Take 800 mg by mouth 3 (three) times daily.)     metFORMIN  (GLUCOPHAGE ) 500 MG tablet Take 1,000 mg by mouth 2 (two) times daily.     methocarbamol  (ROBAXIN ) 500 MG tablet Take 500 mg by mouth every 8 (eight) hours as needed (pain/muscle spasms).     Multiple Vitamin (MULTIVITAMIN WITH MINERALS) TABS tablet Take 1 tablet by mouth in the morning. Centrum     omeprazole (PRILOSEC) 40 MG capsule Take 40 mg by mouth daily before breakfast.     ondansetron  (ZOFRAN ) 4 MG tablet Take 4 mg by mouth every 8 (eight) hours as needed for nausea or vomiting.     OXYGEN Inhale 2 L into the lungs continuous.     Pancrelipase , Lip-Prot-Amyl, (CREON ) 24000-76000 units CPEP Take 1 capsule by mouth with breakfast, with lunch, and with evening meal.     potassium chloride  (KLOR-CON ) 10 MEQ tablet Take 10 mEq by mouth in the morning.     rOPINIRole  (REQUIP ) 1 MG tablet Take 3 mg by mouth at bedtime.     rosuvastatin  (CRESTOR ) 40 MG tablet Take 1 tablet (40 mg total) by mouth at bedtime. 90 tablet 2   sertraline  (ZOLOFT ) 50 MG tablet Take 50 mg by mouth every morning.     traZODone  (DESYREL ) 100 MG tablet Take 100-200 mg by mouth at bedtime.     warfarin (  COUMADIN ) 2.5 MG tablet TAKE 1 TO 1&1/2 TABLETS BY MOUTH EVERY DAY AS DIRECTED BY COUMADIN  CLINIC (Patient taking differently: Take 2.5-3.75 mg by mouth See admin instructions. Take 1.5 tablets (3.75 mg) by mouth on Sundays, Mondays, Wednesdays, and Fridays Take 1 tablet (2.5 mg) by mouth on Tuesdays, Thursdays & Saturdays.) 40 tablet 1   No facility-administered medications prior to  visit.          Objective:   Physical Exam  Vitals:   2024/01/29 1250  BP: 117/76  Pulse: 69  SpO2: 95%  Weight: 190 lb (86.2 kg)  Height: 5' 3 (1.6 m)    Gen: Pleasant, obese woman, in no distress,  normal affect, O2 in place  ENT: No lesions,  mouth clear,  oropharynx clear, no postnasal drip  Neck: No JVD, no stridor  Lungs: No use of accessory muscles, coarse bilateral breath sounds with rhonchi and expiratory wheezes  Cardiovascular: RRR, heart sounds normal, no murmur or gallops, no peripheral edema  Musculoskeletal: No deformities, no cyanosis or clubbing  Neuro: alert, awake, tangential.  Poor memory  Skin: Warm, no lesions or rashes        Assessment & Plan:   COPD (chronic obstructive pulmonary disease) (HCC) Significant COPD with continued smoking.  Discussed with her that this is certainly impacting progressive symptoms.  She is more short of breath, has frequent albuterol  use.  Show not on any scheduled BD therapy currently.  Deals with the high mucus burden, cough.  Will plan to start Stiolto.  Treat her for acute bronchitis with doxycycline .  Follow-up to assess clinical benefit  Chronic respiratory failure with hypoxia (HCC) Hypoxemia noted and oxygen started on discharge from recent hospital admission.  Plan to continue 3 L/min by POC.  Cigarette smoker Discussed cessation with her.  Also reviewed her lung cancer screening CT chest from 09/2023.  She needs a repeat at the 1 year mark.   Time spent 64 minutes reviewing chart notes and studies, discussing treatment options, coordinating care and future studies.   Lamar Chris, MD, PhD 01/29/24, 4:59 PM [facility] [phone] or if no answer before 7:00PM call [phone] For any issues after 7:00PM please call eLink [phone]

## 2024-01-24 NOTE — Patient Instructions (Addendum)
 Please take doxycycline  100 mg twice a day for 7 days until completely gone. We will start a new every day scheduled inhaler medication called Stiolto.  Use 2 puffs once daily every day at the same time. Keep your albuterol  available use 2 puffs when needed for shortness of breath, chest tightness, wheezing. Get your lung cancer screening CT chest in May 2026 Continue your oxygen at 3 L/min as you have been using it. You need to work on decreasing your cigarettes.  Ultimate goal will be to stop altogether. Follow-up in our office in 2 months Follow Dr. Shelah in 6 months, sooner if you have any problems.

## 2024-01-24 NOTE — Assessment & Plan Note (Signed)
 Discussed cessation with her.  Also reviewed her lung cancer screening CT chest from 09/2023.  She needs a repeat at the 1 year mark.

## 2024-01-24 NOTE — Assessment & Plan Note (Signed)
 Significant COPD with continued smoking.  Discussed with her that this is certainly impacting progressive symptoms.  She is more short of breath, has frequent albuterol  use.  Show not on any scheduled BD therapy currently.  Deals with the high mucus burden, cough.  Will plan to start Stiolto.  Treat her for acute bronchitis with doxycycline .  Follow-up to assess clinical benefit

## 2024-01-24 NOTE — Assessment & Plan Note (Signed)
 Hypoxemia noted and oxygen started on discharge from recent hospital admission.  Plan to continue 3 L/min by POC.

## 2024-01-25 ENCOUNTER — Ambulatory Visit: Attending: Cardiology

## 2024-01-25 DIAGNOSIS — I4891 Unspecified atrial fibrillation: Secondary | ICD-10-CM

## 2024-01-25 DIAGNOSIS — Z7901 Long term (current) use of anticoagulants: Secondary | ICD-10-CM | POA: Diagnosis not present

## 2024-01-25 DIAGNOSIS — Z952 Presence of prosthetic heart valve: Secondary | ICD-10-CM

## 2024-01-25 LAB — POCT INR: INR: 5.3 — AB (ref 2.0–3.0)

## 2024-01-25 NOTE — Progress Notes (Signed)
 INR 5.3 Please see anticoagulation encounter Hold Saturday and Sunday then continue taking 1.5 tablets daily EXCEPT 1 tablet on Tuesday, Thursday, and Saturday.  INR in 2 weeks Coumadin  clinic [phone]

## 2024-01-25 NOTE — Patient Instructions (Signed)
 Hold Saturday and Sunday then continue taking 1.5 tablets daily EXCEPT 1 tablet on Tuesday, Thursday, and Saturday.  INR in 2 weeks Coumadin  clinic [phone]

## 2024-01-31 NOTE — Telephone Encounter (Signed)
 Good afternoon Dr. Albertus,   Patient called requesting to transfer her care over to our office. Patient was previously denied by you below in 2023. Patient is now wanting to see Dr. Suzann due to preferring a female provider. Please review and advise on request further.   Thank you.

## 2024-01-31 NOTE — Telephone Encounter (Signed)
 Will forward to Dr. Suzann and let her make this decision as that is the patient's request JMP

## 2024-02-01 ENCOUNTER — Telehealth: Payer: Self-pay | Admitting: *Deleted

## 2024-02-01 NOTE — Telephone Encounter (Signed)
   Pre-operative Risk Assessment    Patient Name: Rose [name]  DOB: December 10, 1955 MRN: [id number]   Date of last office visit: 12/12/23 DAMIEN BRAVER, NP Date of next office visit: 02/11/24 DR. PARKER  Request for Surgical Clearance    Procedure:  DENTAL CLEANING-REGULAR  Date of Surgery:  Clearance 02/01/24                                 Surgeon:   Surgeon's Group or Practice Name:  [facility]  Phone number:  [phone] Fax number:  [phone]   Type of Clearance Requested:   - Medical ; DOES PT NEED SBE   Type of Anesthesia:  None    Additional requests/questions:    Bonney Niels Jest   02/01/2024, 12:32 PM

## 2024-02-01 NOTE — Telephone Encounter (Signed)
   Patient Name: Rose [name]  DOB: Jan 20, 1956 MRN: [id number]  Primary Cardiologist: Oneil Parchment, MD  Chart reviewed as part of pre-operative protocol coverage.   Dental extractions of 1-2 teeth and dental cleanings are considered low risk procedures per guidelines and generally do not require any specific cardiac clearance. It is also generally accepted that for extractions of 1-2 teeth and dental cleanings, there is no need to interrupt blood thinner therapy.  SBE prophylaxis is required for the patient from a cardiac standpoint and should be taken 30-60 minutes before dental procedures.   Per dental office, patient was started on doxycyline by the pulmonologist. This is an acceptable medications prior to dental procedures.   I will route this recommendation to the requesting party via Epic fax function and remove from pre-op pool.  Please call with questions.  Barnie Hila, NP 02/01/2024, 12:40 PM

## 2024-02-04 ENCOUNTER — Ambulatory Visit: Payer: Self-pay | Admitting: Emergency Medicine

## 2024-02-04 ENCOUNTER — Telehealth: Payer: Self-pay | Admitting: *Deleted

## 2024-02-04 ENCOUNTER — Other Ambulatory Visit: Payer: Self-pay | Admitting: Cardiology

## 2024-02-04 ENCOUNTER — Telehealth: Payer: Self-pay

## 2024-02-04 NOTE — Telephone Encounter (Signed)
 FYI Only or Action Required?: Action required by provider: medication refill request and request for alternate rx.  Patient was last seen in primary care on COPD.  Called Nurse Triage reporting Shortness of Breath.  Symptoms began several days ago.  Interventions attempted: Other: pt is requesting refill on red inhaler and requesting alternate Rx d/t diarrhea.  Symptoms are: unchanged.  Triage Disposition: Call PCP When Office is Open  Patient/caregiver understands and will follow disposition?: Yes        Copied from CRM [id number]. Topic: Clinical - Red Word Triage >> Feb 04, 2024  2:18 PM Rose [name] wrote: [facility] that prompted transfer to Nurse Triage: Requesting to speak with nurse - SOB, inhaler not working. Reason for Disposition  Caller wants to use [name] complementary or alternative medicine  Additional Information  Commented on: Answer Assessment    Recently had OV with LBPU and was prescribed doxy. Pt has hx if IBS and started having diarrhea, so stopped taking abx. Triager reinforced importance of finishing abx course. Pt would like alternative abx that does not cause diarrhea.  Of note, pt requesting refill on INH that is red with [name]. Triager will forward encounter for Dr Shelah 's office to review and advise. Patient verbalized understanding and is expecting call back from office for next steps.  Additionally, pt has not opened Stiolto INH. Triager walked thru step-by-step on how to assemble/administer INH. Triager reviewed rescue vs maintenance INH.  Answer Assessment - Initial Assessment Questions E2C2 Pulmonary Triage - Initial Assessment Questions Chief Complaint (e.g., cough, sob, wheezing, fever, chills, sweat or additional symptoms) *Go to specific symptom protocol after initial questions. SOB, productive brown/tan/white cough Endorses recent OV with LBPU and was prescribed Stiolto. Pt reports INH has not provided any relief. Of note, pt endorses  stopping doxy d/t diarrhea. Triager strongly reinforced not stopping abx.    Additionally, pt has not opened the INH. Triager walked thru step-by-step how to assemble/administer INH. Triager reviewed rescue vs maintenance INH.  How long have symptoms been present? [name] few days ago  Have you tested for COVID or Flu? Note: If not, ask patient if [name] home test can be taken. If so, instruct patient to call back for positive results. No  MEDICINES:   Have you used any OTC meds to help with symptoms? No If yes, ask What medications? N/[name]  Have you used your inhalers/maintenance medication? Yes If yes, What medications? Tiotropium Bromide-Olodaterol (STIOLTO RESPIMAT ) [name] Albuterol  INH - does not have Triager reviewed/reinforced albuterol  usage and SIG.    If inhaler, ask How many puffs and how often? Note: Review instructions on medication in the chart. See above  OXYGEN: Do you wear supplemental oxygen? Yes If yes, How many liters are you supposed to use? 2L ATC Of note, needs portable O2 replacement  Do you monitor your oxygen levels? Yes If yes, What is your reading (oxygen level) today? 95% on O2  What is your usual oxygen saturation reading?  (Note: Pulmonary O2 sats should be 90% or greater) Mid 90s     1. RESPIRATORY STATUS: Describe your breathing? (e.g., wheezing, shortness of breath, unable to speak, severe coughing)      See above 2. ONSET: When did this breathing problem begin?      See above 3. PATTERN Does the difficult breathing come and go, or has it been constant since it started?      Comes and goes 4. SEVERITY: How bad is  your breathing? (e.g., mild, moderate, severe)      Mild  Triager does not appreciate audible SOB/wheezing during call. Pt is speaking in full sentences.  5. RECURRENT SYMPTOM: Have you had difficulty breathing before? If Yes, ask: When was the last time? and What happened that time?       unknown 6. CARDIAC HISTORY: Do you have any history of heart disease? (e.g., heart attack, angina, bypass surgery, angioplasty)      *No Answer* 7. LUNG HISTORY: Do you have any history of lung disease?  (e.g., pulmonary embolus, asthma, emphysema)     COPD 8. CAUSE: What do you think is causing the breathing problem?      *No Answer* 9. OTHER SYMPTOMS: Do you have any other symptoms? (e.g., chest pain, cough, dizziness, fever, runny nose)     *No Answer* 10. O2 SATURATION MONITOR:  Do you use an oxygen saturation monitor (pulse oximeter) at home? If Yes, ask: What is your reading (oxygen level) today? What is your usual oxygen saturation reading? (e.g., 95%)       *No Answer* 11. PREGNANCY: Is there any chance you are pregnant? When was your last menstrual period?       *No Answer* 12. TRAVEL: Have you traveled out of the country in the last month? (e.g., travel history, exposures)       *No Answer*  Protocols used: Breathing Difficulty-[name]-AH, Medication Question Call-[name]-AH

## 2024-02-04 NOTE — Telephone Encounter (Signed)
 S/W with dental office and confirmed that the patient is having 1 tooth extracted and fillings same time, crown possibly same day.   Will route to the preop pool for the Preop APP to review

## 2024-02-04 NOTE — Telephone Encounter (Signed)
 Yes send proair . thanks

## 2024-02-04 NOTE — Telephone Encounter (Signed)
   Pre-operative Risk Assessment    Patient Name: Rose [name]  DOB: Apr 10, 1956 MRN: [id number]   Date of last office visit: 12/02/23 DAMIEN BRAVER, NP Date of next office visit: 02/11/24 FONDA KITTY, MD  Request for Surgical Clearance    Procedure:  Dental Extraction - Amount of Teeth to be Pulled:  1 TOOTH, FILLINGS, CROWNS, BRIDGES  Date of Surgery:  Clearance TBD                                Surgeon:  GORDY FAIRLY, DMD Surgeon's Group or Practice Name:  [facility] Phone number:  [phone] Fax number:  [phone]   Type of Clearance Requested:   - Medical  - Pharmacy:  Hold Warfarin (Coumadin )     Type of Anesthesia:  Local    Additional requests/questions:    SignedLucie DELENA Ku   02/04/2024, 4:33 PM

## 2024-02-04 NOTE — Telephone Encounter (Signed)
 Can we send alternate rx to [facility] as pt is having Diarrhea, Pt is also requesting Albuterol  inhaler sent to pharmacy

## 2024-02-04 NOTE — Telephone Encounter (Signed)
 Patient does not feel albuterol  sulfate is working for her. She has always used ProAir  and would like rx sent to the pharmacy. Please advise.

## 2024-02-04 NOTE — Telephone Encounter (Signed)
 Preoperative team,  Please contact requesting office and let them know that we are not able to provide blanket coverage.  Please ask for specific details surrounding upcoming procedure.  Once we have details we will be able to provide recommendations from a cardiac standpoint.  Thank you for your help.  Josefa HERO. Danaka Llera NP-C     02/04/2024, 4:40 PM [facility] [address] [place], [place] [address] Office [phone]

## 2024-02-04 NOTE — Telephone Encounter (Signed)
 Ok to refill the albuterol , have her stop the antibiotics altogether. We can consider an alternative depending on how her sx and the side effects progress

## 2024-02-05 MED ORDER — AMIODARONE HCL 200 MG PO TABS: 200.0000 mg | ORAL_TABLET | Freq: Two times a day (BID) | ORAL | 3 refills | Status: DC

## 2024-02-05 MED ORDER — ALBUTEROL SULFATE HFA 108 (90 BASE) MCG/ACT IN AERS: 1.0000 | INHALATION_SPRAY | Freq: Four times a day (QID) | RESPIRATORY_TRACT | 2 refills | Status: AC | PRN

## 2024-02-05 MED ORDER — ALBUTEROL SULFATE HFA 108 (90 BASE) MCG/ACT IN AERS: 1.0000 | INHALATION_SPRAY | Freq: Four times a day (QID) | RESPIRATORY_TRACT | 2 refills | Status: DC | PRN

## 2024-02-05 NOTE — Telephone Encounter (Signed)
    Primary Cardiologist: Oneil Parchment, MD  Chart reviewed as part of pre-operative protocol coverage. Simple dental extractions are considered low risk procedures per guidelines and generally do not require any specific cardiac clearance. It is also generally accepted that for simple extractions and dental cleanings, there is no need to interrupt blood thinner therapy.   SBE prophylaxis is required for the patient.  I will route this recommendation to the requesting party via Epic fax function and remove from pre-op pool.  Please call with questions.  Josefa CHRISTELLA Beauvais, NP 02/05/2024, 8:35 AM

## 2024-02-05 NOTE — Telephone Encounter (Signed)
 Please see 9/8 encounter.  Spoke with the pt this AM and pt would like albuterol  inhaler. Sent to pharmacy. Nfn

## 2024-02-05 NOTE — Addendum Note (Signed)
 Addended by: BLUFORD RAMP D on: 02/05/2024 03:10 PM   Modules accepted: Orders

## 2024-02-05 NOTE — Telephone Encounter (Signed)
 Called and spoke with the pt. Advised of RB note to stop antibiotics. Albuterol  rx has been sent to pharmacy per pt request. Nfn

## 2024-02-07 ENCOUNTER — Ambulatory Visit: Attending: Cardiology | Admitting: *Deleted

## 2024-02-07 DIAGNOSIS — Z5181 Encounter for therapeutic drug level monitoring: Secondary | ICD-10-CM

## 2024-02-07 DIAGNOSIS — I4891 Unspecified atrial fibrillation: Secondary | ICD-10-CM

## 2024-02-07 DIAGNOSIS — Z952 Presence of prosthetic heart valve: Secondary | ICD-10-CM

## 2024-02-07 LAB — POCT INR: POC INR: 3.3

## 2024-02-07 NOTE — Patient Instructions (Signed)
 Description   START taking warfarin 1 tablet daily except for 1.5 tablets on Monday, Wednesday and Friday. Recheck INR in 2 weeks. Eat a serving of greens today and please let us  know if you are started on any new medications.  Coumadin  clinic [phone]

## 2024-02-07 NOTE — Progress Notes (Signed)
 INR 3.3 Please see anticoagulation encounter Lab Results  Component Value Date   INR 3.3 02/07/2024   INR 5.3 (A) 01/25/2024   INR 2.6 (H) 01/18/2024   Description   START taking warfarin 1 tablet daily except for 1.5 tablets on Monday, Wednesday and Friday. Recheck INR in 2 weeks. Eat a serving of greens today and please let us  know if you are started on any new medications.  Coumadin  clinic [phone]

## 2024-02-10 NOTE — Progress Notes (Deleted)
 Electrophysiology Office Note:   Date:  02/10/2024  ID:  Rose [name], DOB Oct 09, 1955, MRN [id number]  Primary Cardiologist: Oneil Parchment, MD Electrophysiologist: Fonda Kitty, MD  {Click to update primary MD,subspecialty MD or APP then REFRESH:1}    History of Present Illness:   Rose [name] is a 68 y.o. 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  She was evaluated by CT surgery and ultimately underwent mitral valve replacement with bioprosthetic mitral valve as well as tricuspid valve repair in 04/2023. She did develop postop atrial fibrillation. She was started on Coumadin . She was hospitalized in February 2025 in the setting of acute on chronic diastolic heart failure, atrial flutter with RVR.  She underwent TEE/DCCV.  She was started on amiodarone  therapy. She was evaluated in the ED in 07/2023 in the setting of shortness of breath, chest pain. EKG showed rate controlled atrial fibrillation. Patient is on warfarin for stroke prevention. Referred to the AF clinic to discuss options.   Underwent cardioversion on 01/18/2024.  Discussed the use of AI scribe software for clinical note transcription with the patient, who gave verbal consent to proceed.  History of Present Illness     Review of systems complete and found to be negative unless listed in HPI.   EP Information / Studies Reviewed:    {EKGtoday:28818}     EKG 01/14/24: AF   EKG 06/21/23: AFL   TEE 01/18/24:   1. Left ventricular ejection fraction, by estimation, is 55 to 60%. The  left ventricle has low normal function.   2. Right ventricular systolic function is normal. The right ventricular  size is mildly enlarged.   3. Left atrial size was severely dilated. No left atrial/left atrial  appendage thrombus was detected. The LAA emptying velocity was 53 cm/s.   4. Right atrial  size was severely dilated.   5. The mitral valve has been repaired/replaced. No evidence of mitral  valve regurgitation. No evidence of mitral stenosis. There is a 29 mm  bioprosthetic valve present in the mitral position. Procedure Date:  05/11/23.   6. The tricuspid valve is has been repaired/replaced. Tricuspid valve  regurgitation is severe.   7. The aortic valve is calcified. Aortic valve regurgitation is trivial.  No aortic stenosis is present.   8. There is Moderate (Grade III) plaque involving the ascending aorta and  descending aorta.   9. 3D performed of the mitral valve and demonstrates Normal prosthetic  valve function.   Risk Assessment/Calculations:    CHA2DS2-VASc Score = 7  {Confirm score is correct.  If not, click here to update score.  REFRESH note.  :1} This indicates a 11.2% annual risk of stroke. The patient's score is based upon: CHF History: 1 HTN History: 0 Diabetes History: 1 Stroke History: 2 Vascular Disease History: 1 Age Score: 1 Gender Score: 1   {This patient has a significant risk of stroke if diagnosed with atrial fibrillation.  Please consider VKA or DOAC agent for anticoagulation if the bleeding risk is acceptable.   You can also use the SmartPhrase .[email or web address] for documentation.   :[id number]} No BP recorded.  {Refresh Note OR Click here to enter BP  :1}***        Physical Exam:   VS:  There were no vitals taken for this visit.   Wt Readings from Last 3 Encounters:  01/24/24 190  lb (86.2 kg)  01/14/24 193 lb 6.4 oz (87.7 kg)  12/12/23 198 lb (89.8 kg)     GEN: Well nourished, well developed in no acute distress NECK: No JVD CARDIAC: {EPRHYTHM:28826}, no murmurs, rubs, gallops RESPIRATORY:  Clear to auscultation without rales, wheezing or rhonchi  ABDOMEN: Soft, non-distended EXTREMITIES:  No edema; No deformity   ASSESSMENT AND PLAN:      #. Persistent atrial fibrillation #. Typical AFL: #. High risk medication use:  #.  Secondary hypercoagulable state due to atrial fibrillation  #. 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 No evidence of low output on dilt. Continue to monitor.  - Arrange for TEE and DCCV tomorrow. NPO after midnight. Once back in sinus rhythm would discontinue diltiazem  and start amiodarone  for maintenance of sinus rhythm in the post operative period, up to 90 days.  - Continue heparin  gtt with bridge to therapeutic warfarin.  - Repeat echocardiogram ordered.  Assessment & Plan       Follow up with {EPMDS:28135::EP Team} {EPFOLLOW LE:71826}  Signed, Fonda Kitty, MD

## 2024-02-11 ENCOUNTER — Ambulatory Visit: Attending: Cardiology | Admitting: Cardiology

## 2024-02-11 DIAGNOSIS — I4819 Other persistent atrial fibrillation: Secondary | ICD-10-CM

## 2024-02-11 DIAGNOSIS — I483 Typical atrial flutter: Secondary | ICD-10-CM

## 2024-02-11 DIAGNOSIS — D6869 Other thrombophilia: Secondary | ICD-10-CM

## 2024-02-13 ENCOUNTER — Encounter: Payer: Self-pay | Admitting: Cardiology

## 2024-02-18 ENCOUNTER — Other Ambulatory Visit: Payer: Self-pay | Admitting: Cardiology

## 2024-02-18 DIAGNOSIS — Z952 Presence of prosthetic heart valve: Secondary | ICD-10-CM

## 2024-02-18 DIAGNOSIS — I4891 Unspecified atrial fibrillation: Secondary | ICD-10-CM

## 2024-02-18 MED ORDER — WARFARIN SODIUM 2.5 MG PO TABS: ORAL_TABLET | ORAL | 3 refills | Status: DC

## 2024-02-18 NOTE — Telephone Encounter (Signed)
*  STAT* If patient is at the pharmacy, call can be transferred to refill team.   1. Which medications need to be refilled? (please list name of each medication and dose if known)   warfarin (COUMADIN ) 2.5 MG tablet     2. Would you like to learn more about the convenience, safety, & potential cost savings by using the [facility]? No   3. Are you open to using the [facility] (Type [facility]. ). No   4. Which pharmacy/location (including street and city if local pharmacy) is medication to be sent to? [facility] - [place], [place] - [address]     5. Do they need a 30 day or 90 day supply? 30 day

## 2024-02-18 NOTE — Telephone Encounter (Signed)
 Refill request for warfarin:  Last INR was 3.3 on 02/07/24 Next INR due 02/22/24 LOV was 01/14/24  Refill approved.

## 2024-02-22 ENCOUNTER — Ambulatory Visit

## 2024-02-27 ENCOUNTER — Ambulatory Visit: Attending: Cardiology | Admitting: Pharmacist

## 2024-02-27 DIAGNOSIS — I4891 Unspecified atrial fibrillation: Secondary | ICD-10-CM | POA: Diagnosis not present

## 2024-02-27 DIAGNOSIS — Z7901 Long term (current) use of anticoagulants: Secondary | ICD-10-CM | POA: Diagnosis not present

## 2024-02-27 DIAGNOSIS — Z952 Presence of prosthetic heart valve: Secondary | ICD-10-CM

## 2024-02-27 LAB — POCT INR: INR: 6.3 — AB (ref 2.0–3.0)

## 2024-02-27 NOTE — Patient Instructions (Signed)
 Description   INR 6.3: Hold warfarin today, tomorrow, and Friday. Eat greens when you get home. Then continue taking warfarin 1 tablet daily except for 1.5 tablets on Monday, Wednesday and Friday. Recheck INR in 2 weeks. Eat a serving of greens today and please let us  know if you are started on any new medications.  Coumadin  clinic [phone]

## 2024-02-27 NOTE — Progress Notes (Signed)
 Description   INR 6.3: Hold warfarin today, tomorrow, and Friday. Eat greens when you get home. Then continue taking warfarin 1 tablet daily except for 1.5 tablets on Monday, Wednesday and Friday. Recheck INR in 2 weeks. Eat a serving of greens today and please let us  know if you are started on any new medications.  Coumadin  clinic [phone]     Patient has no signs/symptoms of bleeding. Advised if she loses her instructions she needs to call the Coumadin  clinic rather than guess what her dose is that day.

## 2024-03-03 ENCOUNTER — Other Ambulatory Visit: Payer: Self-pay | Admitting: Nurse Practitioner

## 2024-03-03 DIAGNOSIS — N184 Chronic kidney disease, stage 4 (severe): Secondary | ICD-10-CM

## 2024-03-04 NOTE — Telephone Encounter (Signed)
 Patient advised.

## 2024-03-11 ENCOUNTER — Ambulatory Visit

## 2024-03-20 ENCOUNTER — Ambulatory Visit: Attending: Cardiology | Admitting: Cardiology

## 2024-03-20 ENCOUNTER — Encounter: Payer: Self-pay | Admitting: Cardiology

## 2024-03-20 ENCOUNTER — Ambulatory Visit (INDEPENDENT_AMBULATORY_CARE_PROVIDER_SITE_OTHER)

## 2024-03-20 VITALS — BP 135/79 | HR 76 | Ht 63.0 in | Wt 187.0 lb

## 2024-03-20 DIAGNOSIS — I4892 Unspecified atrial flutter: Secondary | ICD-10-CM

## 2024-03-20 DIAGNOSIS — D6869 Other thrombophilia: Secondary | ICD-10-CM

## 2024-03-20 DIAGNOSIS — Z952 Presence of prosthetic heart valve: Secondary | ICD-10-CM

## 2024-03-20 DIAGNOSIS — I4819 Other persistent atrial fibrillation: Secondary | ICD-10-CM

## 2024-03-20 DIAGNOSIS — I4891 Unspecified atrial fibrillation: Secondary | ICD-10-CM

## 2024-03-20 DIAGNOSIS — Z79899 Other long term (current) drug therapy: Secondary | ICD-10-CM | POA: Diagnosis not present

## 2024-03-20 DIAGNOSIS — Z7901 Long term (current) use of anticoagulants: Secondary | ICD-10-CM

## 2024-03-20 LAB — POCT INR: INR: 2.7 (ref 2.0–3.0)

## 2024-03-20 MED ORDER — AMIODARONE HCL 200 MG PO TABS: 200.0000 mg | ORAL_TABLET | Freq: Every day | ORAL | 3 refills | Status: AC

## 2024-03-20 NOTE — Patient Instructions (Signed)
 Medication Instructions:  Your physician has recommended you make the following change in your medication:  1) DECREASE amiodarone  200 mg once daily *If you need a refill on your cardiac medications before your next appointment, please call your pharmacy*  Follow-Up: At [facility], you and your health needs are our priority.  As part of our continuing mission to provide you with exceptional heart care, our providers are all part of one team.  This team includes your primary Cardiologist (physician) and Advanced Practice Providers or APPs (Physician Assistants and Nurse Practitioners) who all work together to provide you with the care you need, when you need it.  Your next appointment:   6 months  Provider:   You will see one of the following Advanced Practice Providers on your designated Care Team:   Charlies Arthur, [place] Ozell Jodie Passey, PA-C Suzann Riddle, NP Daphne Barrack, NP Artist Pouch, PA-C

## 2024-03-20 NOTE — Progress Notes (Signed)
 INR 2.7 Please see anticoagulation encounter continue taking warfarin 1 tablet daily except for 1.5 tablets on Monday, Wednesday and Friday. Recheck INR in 4 weeks.   Coumadin  clinic [phone]

## 2024-03-20 NOTE — Patient Instructions (Signed)
 continue taking warfarin 1 tablet daily except for 1.5 tablets on Monday, Wednesday and Friday. Recheck INR in 4 weeks.   Coumadin  clinic [phone]

## 2024-03-20 NOTE — Progress Notes (Unsigned)
 Electrophysiology Office Note:   Date:  03/21/2024  ID:  Rose [name], DOB Jan 25, 1956, MRN [id number]  Primary Cardiologist: Oneil Parchment, MD Electrophysiologist: Fonda Kitty, MD      History of Present Illness:   Rose [name] is a 68 y.o. 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  Discussed the use of AI scribe software for clinical note transcription with the patient, who gave verbal consent to proceed.  History of Present Illness Rose [name] is a 68 year old female with atrial fibrillation and rheumatic mitral valve disease who presents for follow-up regarding her cardiac rhythm management.  She has a history of atrial fibrillation and atrial flutter, which developed post-surgery earlier this year. She was started on amiodarone . Despite this, she experiences pain on the left side where the surgery was performed. No chest pain, shortness of breath, or palpitations.  She has a history of rheumatic mitral valve disease, resulting in a dilated left atrium. She is currently on warfarin to manage stroke risk associated with her atrial fibrillation and flutter. Her INR has not been checked in over two weeks, and she mentions that her last INR was quite high.  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 She is concerned about why it was not completely fixed.  She has a history of diabetes and was previously on metformin , which she believes affected her kidneys. She is now on Jardiance but does not monitor her glucose daily due to lack of a meter. She is unsure if her hemoglobin A1c is being regularly checked.  She continues to smoke and has experienced adverse reactions to smoking cessation aids such as patches, gum, and nasal spray. She mentions having thin skin that reacts to patches, dental  issues that prevent gum use, and headaches from the nasal spray. She reports walking five to six times a day, indicating some level of physical activity.    Review of systems complete and found to be negative unless listed in HPI.   EP Information / Studies Reviewed:    EKG is ordered today. Personal review as below.  EKG Interpretation Date/Time:  Thursday March 20 2024 14:19:52 EDT Ventricular Rate:  76 PR Interval:  174 QRS Duration:  88 QT Interval:  472 QTC Calculation: 531 R Axis:   21  Text Interpretation: Normal sinus rhythm Minimal voltage criteria for LVH, may be normal variant ( R in aVL ) Nonspecific ST and T wave abnormality Prolonged QT When compared with ECG of 18-Jan-2024 13:17, PR interval has decreased Confirmed by Kitty Fonda [phone]) on 03/21/2024 9:37:44 PM   ECG 01/14/24: AF    ECG 06/21/23:   TEE 01/18/24:  1. Left ventricular ejection fraction, by estimation, is 55 to 60%. The  left ventricle has low normal function.   2. Right ventricular systolic function is normal. The right ventricular  size is mildly enlarged.   3. Left atrial size was severely dilated. No left atrial/left atrial  appendage thrombus was detected. The LAA emptying velocity was 53 cm/s.   4. Right atrial size was severely dilated.   5. The mitral valve has been repaired/replaced. No evidence of mitral  valve regurgitation. No evidence of mitral stenosis. There is a 29 mm  bioprosthetic valve present in the mitral position. Procedure Date:  05/11/23.   6. The tricuspid  valve is has been repaired/replaced. Tricuspid valve  regurgitation is severe.   7. The aortic valve is calcified. Aortic valve regurgitation is trivial.  No aortic stenosis is present.   8. There is Moderate (Grade III) plaque involving the ascending aorta and  descending aorta.   9. 3D performed of the mitral valve and demonstrates Normal prosthetic  valve function.    Risk Assessment/Calculations:     CHA2DS2-VASc Score = 7   This indicates a 11.2% annual risk of stroke. The patient's score is based upon: CHF History: 1 HTN History: 0 Diabetes History: 1 Stroke History: 2 Vascular Disease History: 1 Age Score: 1 Gender Score: 1      Physical Exam:   VS:  BP 135/79   Pulse 76   Ht 5' 3 (1.6 m)   Wt 187 lb (84.8 kg)   SpO2 91%   BMI 33.13 kg/m    Wt Readings from Last 3 Encounters:  03/20/24 187 lb (84.8 kg)  01/24/24 190 lb (86.2 kg)  01/14/24 193 lb 6.4 oz (87.7 kg)     GEN: Chronically ill appearing, in no acute distress NECK: No JVD CARDIAC: Normal rate, regular rhythm RESPIRATORY:  Clear to auscultation without rales, wheezing or rhonchi  ABDOMEN: Soft, non-distended EXTREMITIES:  No edema; No deformity   ASSESSMENT AND PLAN:    #. Atrial flutter: Appears typical by ECG but cannot r/o atypical due to recent cardiac surgery.  #. Persistent atrial fibrillation:  #. High risk medication use: Amiodarone . LFTs normal in 12/2023. TSH mildly elevated.  - Patient is not a candidate for catheter ablation.  Elevated creatinine limits possibility of Tikosyn.  Structural heart disease prohibits class Ic agents.  Amiodarone  appears to be her only antiarrhythmic option.  Luckily, amiodarone  is working to maintain sinus rhythm. - Continue amiodarone  200 mg once daily.  #. Hypercoagulable state due to AF: Valvular heart disease.  -Continue warfarin. Close follow up with Coumadin  Clinic.   #. 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  - Continue follow-up with general cardiology.  Lasix  for volume management.  Follow up with EP APP in 6 months  Signed, Fonda Kitty, MD

## 2024-03-26 ENCOUNTER — Ambulatory Visit: Admitting: Primary Care

## 2024-03-26 ENCOUNTER — Encounter: Payer: Self-pay | Admitting: Primary Care

## 2024-03-26 VITALS — BP 120/62 | HR 75 | Temp 97.2°F | Ht 61.5 in | Wt 195.0 lb

## 2024-03-26 DIAGNOSIS — F17210 Nicotine dependence, cigarettes, uncomplicated: Secondary | ICD-10-CM | POA: Diagnosis not present

## 2024-03-26 DIAGNOSIS — J449 Chronic obstructive pulmonary disease, unspecified: Secondary | ICD-10-CM | POA: Diagnosis not present

## 2024-03-26 DIAGNOSIS — J9611 Chronic respiratory failure with hypoxia: Secondary | ICD-10-CM | POA: Diagnosis not present

## 2024-03-26 DIAGNOSIS — R918 Other nonspecific abnormal finding of lung field: Secondary | ICD-10-CM | POA: Diagnosis not present

## 2024-03-26 MED ORDER — STIOLTO RESPIMAT 2.5-2.5 MCG/ACT IN AERS: 2.0000 | INHALATION_SPRAY | Freq: Every day | RESPIRATORY_TRACT | 11 refills | Status: AC

## 2024-03-26 MED ORDER — PREDNISONE 10 MG PO TABS: ORAL_TABLET | ORAL | 0 refills | Status: AC

## 2024-03-26 MED ORDER — AMOXICILLIN-POT CLAVULANATE 875-125 MG PO TABS: 1.0000 | ORAL_TABLET | Freq: Two times a day (BID) | ORAL | 0 refills | Status: AC

## 2024-03-26 NOTE — Patient Instructions (Signed)
  VISIT SUMMARY: You came in today for a follow-up on your lung health. We discussed your current medications, your persistent cough, and your use of oxygen therapy. We also reviewed your recent CT scan results and your smoking habits.  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 Continue using your Stiolto inhaler daily with two puffs in the morning. You qualify for a new portable oxygen concentrator, and we will send the order to the medical supply store. Use your oxygen during moderate to heavy activities.  -ACUTE BRONCHITIS IN THE SETTING OF COPD: Acute bronchitis is an infection of the airways that causes coughing and mucus. You will start a new course of antibiotics and prednisone  to help clear the infection. Please complete the full course of these medications.  -PULMONARY NODULE, RIGHT MIDDLE LOBE, UNDER SURVEILLANCE: A pulmonary nodule is a small growth in the lung. Your nodule is stable, but we need to keep an eye on it. You have a follow-up CT scan scheduled for December to monitor it.  -TOBACCO USE DISORDER: Smoking can worsen your lung condition and increase the risk of lung cancer. We discussed the importance of lung cancer screening, and you are open to having an annual CT scan for this purpose.  INSTRUCTIONS: Please continue using your Stiolto inhaler daily with two puffs in the morning. Start the prescribed antibiotics and prednisone  and complete the full course. Use your oxygen during moderate to heavy activities. We will order a new portable oxygen concentrator for you. You have a follow-up CT scan scheduled for December to monitor your pulmonary nodule. Consider the benefits of quitting smoking and the importance of annual lung cancer screening with a CT scan.  Orders: Qualifying walk for  POC  Follow-up 3-4 months with Dr. [name]

## 2024-03-26 NOTE — Progress Notes (Signed)
 @Patient  ID: Rose [name], female    DOB: [birth year], 68 y.o.   MRN: [id number]  Chief Complaint  Patient presents with   COPD    Uses 2L O2 with exertion and HS    Referring provider: Arloa Jarvis, NP  HPI: 68 year old female, current every day smoker. PMH significant for CHF, afib, stroke, s/p mitral valve replacement, COPD, chronic respiratory failure, type 2 diabetes, arthritis, eczematous dermatitis, elevated liver enzymes, depression.anxiety, ADD, polysubstance.  03/26/2024 Discussed the use of AI scribe software for clinical note transcription with the patient, who gave verbal consent to proceed.  History of Present Illness Rose [name] is a 68 year old female with COPD and respiratory failure who presents for a two-month follow-up for her lungs.  She was started on a new inhaler, Stiolto, in August and was also given antibiotics at that time. She just received the inhaler two weeks ago due to a delay in receiving it from the pharmacy. She has been using it daily in the morning, but was not aware that she needed to take two puffs. She feels that the inhaler has helped with her breathing.  She completed a course of antibiotics in August for bronchitis, but her cough did not improve and she continues to have a persistent cough. She rates her cough as a five on a scale where five is coughing all the time. She also has a full chest of phlegm, but no chest tightness. She is unable to walk up a hill or a flight of stairs and is limited in doing activities at home due to her breathing. She feels nervous about leaving her home due to her breathing issues.  She uses oxygen during the day and at night. Her portable oxygen concentrator broke after a fall and she is interested in obtaining a new one. She continues to smoke half a pack of cigarettes per day. She is open to getting a CT scan once a year for lung cancer screening.  She reports sleeping well once she takes her medication but  has no energy. She is currently taking Mucinex  DM, but is unsure of the dosing frequency. She had a CT scan in May and was told she has pulmonary nodules, emphysema, bronchial thickening, atelectasis, and multiple calcified granulomas. She is due for a follow-up CT scan in December.   Allergies  Allergen Reactions   Erythromycin Shortness Of Breath   Nickel Rash and Shortness Of Breath   Oxycodone  Shortness Of Breath   Meperidine Other (See Comments)    Confusion and tingling  (noted in H&P)    Nicotine  Hives and Itching   Codeine Swelling   Hydrocodone -Acetaminophen  Itching    Tolerated Norco 11/2018   Morphine Itching   Propoxyphene Other (See Comments)    Ineffective    Quetiapine     Restless leg syndrome    Immunization History  Administered Date(s) Administered   Influenza Split 03/10/2015   Influenza,inj,Quad PF,6+ Mos 02/04/2019   PFIZER(Purple Top)SARS-COV-2 Vaccination 09/04/2019, 10/01/2019   Pneumococcal Conjugate-13 03/23/2014   Tdap 01/27/2018   Zoster Recombinant(Shingrix) 11/09/2018    Past Medical History:  Diagnosis Date   Allergy    Anemia    Anxiety    Aortic atherosclerosis    Arthritis    Asthma    Back pain    CAD (coronary artery disease)    Chronic headaches    Class 1 obesity due to excess calories with serious comorbidity in adult    Complication of  anesthesia    slow to wake up   COPD (chronic obstructive pulmonary disease) (HCC)    Diabetes (HCC)    Fatty liver    GERD (gastroesophageal reflux disease)    Headache    Herniated lumbar intervertebral disc    High cholesterol    History of blood clots    Lumbago with sciatica, unspecified side    Mitral stenosis    severe by 05/18/21 echo   Moderate episode of recurrent major depressive disorder (HCC)    Nicotine  dependence, cigarettes, with other nicotine -induced disorders    Nonrheumatic aortic (valve) insufficiency    Other chronic pain    Pain in joint involving ankle and foot     Pancreatitis    Pneumonia    Presence urogenital implant    Severe mitral regurgitation    Sleep apnea    no cpap   Stroke (HCC)    mild   SUI (stress urinary incontinence, female)    Tricuspid regurgitation    Type 2 diabetes mellitus with hyperglycemia, with long-term current use of insulin  (HCC)     Tobacco History: Social History   Tobacco Use  Smoking Status Every Day   Current packs/day: 0.50   Average packs/day: 0.5 packs/day for 47.0 years (23.5 ttl pk-yrs)   Types: Cigarettes  Smokeless Tobacco Never  Tobacco Comments   0.5 packs smoked daily. ARJ 03/29/21   Patient states she is not ready to give up smoking.    Ready to quit: Not Answered Counseling given: Not Answered Tobacco comments: 0.5 packs smoked daily. ARJ 03/29/21 Patient states she is not ready to give up smoking.    Outpatient Medications Prior to Visit  Medication Sig Dispense Refill   albuterol  (VENTOLIN  HFA) 108 (90 Base) MCG/ACT inhaler Inhale 1-2 puffs into the lungs every 6 (six) hours as needed for wheezing or shortness of breath. 18 g 2   amiodarone  (PACERONE ) 200 MG tablet Take 1 tablet (200 mg total) by mouth daily. 90 tablet 3   amoxicillin  (AMOXIL ) 500 MG tablet Prophylaxis for dental procedure. Take 2 grams (4x 500mg  tablets) 1 hour before procedure.     clonazePAM  (KLONOPIN ) 1 MG tablet Take 1 mg by mouth 3 (three) times daily.     clotrimazole (MYCELEX) 10 MG troche Take 10 mg by mouth 4 (four) times daily as needed.     doxycycline  (VIBRA -TABS) 100 MG tablet Take 1 tablet (100 mg total) by mouth 2 (two) times daily. 14 tablet 0   furosemide  (LASIX ) 20 MG tablet Take 2 tablets (40 mg total) by mouth daily. (Patient taking differently: Take 60 mg by mouth in the morning.) 180 tablet 3   gabapentin  (NEURONTIN ) 800 MG tablet Take 0.5 tablets (400 mg total) by mouth 3 (three) times daily. (Patient taking differently: Take 800 mg by mouth 3 (three) times daily.)     metFORMIN  (GLUCOPHAGE ) 500  MG tablet Take 1,000 mg by mouth 2 (two) times daily.     methocarbamol  (ROBAXIN ) 500 MG tablet Take 500 mg by mouth every 8 (eight) hours as needed (pain/muscle spasms).     Multiple Vitamin (MULTIVITAMIN WITH MINERALS) TABS tablet Take 1 tablet by mouth in the morning. Centrum     omeprazole (PRILOSEC) 40 MG capsule Take 40 mg by mouth daily before breakfast.     ondansetron  (ZOFRAN ) 4 MG tablet Take 4 mg by mouth every 8 (eight) hours as needed for nausea or vomiting.     OXYGEN Inhale 2 L into the  lungs continuous.     Pancrelipase , Lip-Prot-Amyl, (CREON ) 24000-76000 units CPEP Take 1 capsule by mouth with breakfast, with lunch, and with evening meal.     potassium chloride  (KLOR-CON ) 10 MEQ tablet Take 10 mEq by mouth in the morning.     rOPINIRole  (REQUIP ) 1 MG tablet Take 3 mg by mouth at bedtime.     rosuvastatin  (CRESTOR ) 40 MG tablet Take 1 tablet (40 mg total) by mouth at bedtime. 90 tablet 2   sertraline  (ZOLOFT ) 50 MG tablet Take 50 mg by mouth every morning.     Tiotropium Bromide-Olodaterol (STIOLTO RESPIMAT ) 2.5-2.5 MCG/ACT AERS Inhale 2 puffs into the lungs daily. 4 g 11   traZODone  (DESYREL ) 100 MG tablet Take 100-200 mg by mouth at bedtime.     warfarin (COUMADIN ) 2.5 MG tablet TAKE 1 TO 1&1/2 TABLETS BY MOUTH EVERY DAY AS DIRECTED BY COUMADIN  CLINIC 40 tablet 3   No facility-administered medications prior to visit.   Review of Systems  Review of Systems  Constitutional: Negative.   HENT: Negative.    Respiratory:  Positive for cough and shortness of breath.    Physical Exam  BP 120/62   Pulse 75   Temp (!) 97.2 F (36.2 C)   Ht 5' 1.5 (1.562 m)   Wt 195 lb (88.5 kg)   SpO2 91% Comment: ra  BMI 36.25 kg/m  Physical Exam Constitutional:      General: She is not in acute distress.    Appearance: Normal appearance. She is not ill-appearing.  HENT:     Head: Normocephalic and atraumatic.  Cardiovascular:     Rate and Rhythm: Normal rate and regular rhythm.   Pulmonary:     Effort: Pulmonary effort is normal.     Breath sounds: Normal breath sounds.  Musculoskeletal:        General: Normal range of motion.  Skin:    General: Skin is warm and dry.  Neurological:     General: No focal deficit present.     Mental Status: She is alert and oriented to person, place, and time. Mental status is at baseline.  Psychiatric:        Mood and Affect: Mood normal.        Behavior: Behavior normal.        Thought Content: Thought content normal.        Judgment: Judgment normal.     Lab Results:  CBC    Component Value Date/Time   WBC 4.9 01/14/2024 1656   WBC 5.0 11/09/2023 1911   RBC 4.50 01/14/2024 1656   RBC 4.20 11/09/2023 1911   HGB 9.6 (L) 01/14/2024 1656   HCT 33.1 (L) 01/14/2024 1656   PLT 226 01/14/2024 1656   MCV 74 (L) 01/14/2024 1656   MCH 21.3 (L) 01/14/2024 1656   MCH 21.0 (L) 11/09/2023 1911   MCHC 29.0 (L) 01/14/2024 1656   MCHC 28.9 (L) 11/09/2023 1911   RDW 20.1 (H) 01/14/2024 1656   LYMPHSABS 0.7 11/09/2023 1911   MONOABS 0.3 11/09/2023 1911   EOSABS 0.1 11/09/2023 1911   BASOSABS 0.0 11/09/2023 1911    BMET    Component Value Date/Time   NA 140 01/14/2024 1656   K 3.6 01/14/2024 1656   CL 97 01/14/2024 1656   CO2 24 01/14/2024 1656   GLUCOSE 109 (H) 01/14/2024 1656   GLUCOSE 111 (H) 11/09/2023 1911   BUN 16 01/14/2024 1656   CREATININE 1.75 (H) 01/14/2024 1656   CALCIUM  9.0 01/14/2024  1656   GFRNONAA 42 (L) 11/09/2023 1911   GFRAA >60 12/14/2018 0647    BNP    Component Value Date/Time   BNP 138.6 (H) 11/09/2023 1911    ProBNP No results found for: PROBNP  Imaging: No results found.   Assessment & Plan:   1. Chronic obstructive pulmonary disease, unspecified COPD type (HCC) (Primary)  2. Chronic respiratory failure with hypoxia (HCC)   Assessment and Plan Assessment & Plan Chronic obstructive pulmonary disease (COPD) with chronic respiratory failure and hypoxia COPD with chronic  respiratory failure and hypoxia, requiring continuous oxygen therapy. Oxygen saturation was 91% on room air. Stiolto inhaler initiated two weeks ago has improved breathing. - Ensure use of Stiolto inhaler daily, two puffs in the morning. - Qualifying walk for a portable oxygen concentrator  - Advise use of oxygen during moderate to heavy exertion.  Acute bronchitis in the setting of COPD Acute bronchitis with persistent cough and mucus congestion. Previous antibiotics did not resolve symptoms. Examination reveals expiratory wheezing and bronchi. - Prescribe antibiotics and prednisone  to treat bronchitis. - Advise to complete the course of antibiotics and prednisone .  Pulmonary nodule, right middle lobe, under surveillance Pulmonary nodule in the right middle lobe, 5 mm, identified in May. Under surveillance with a follow-up CT scan scheduled for December. Nodule considered stable; further evaluation needed to rule out malignancy. - Order CT scan in December for surveillance of pulmonary nodule. - Coordinate with lung cancer screening program for CT scan scheduling.  Tobacco use disorder Continues to smoke half a pack of cigarettes per day. Not part of a lung cancer screening program but open to annual CT scans for screening. - Discuss lung cancer screening with annual CT scan.   Follow-up 3-4 months with Dr. Shelah   I personally spent a total of 30 minutes in the care of the patient today including performing a medically appropriate exam/evaluation, counseling and educating, placing orders, documenting clinical information in the EHR, and communicating results.   Almarie LELON Ferrari, NP 03/26/2024

## 2024-03-31 ENCOUNTER — Ambulatory Visit: Attending: Nurse Practitioner | Admitting: Nurse Practitioner

## 2024-03-31 ENCOUNTER — Encounter: Payer: Self-pay | Admitting: Nurse Practitioner

## 2024-03-31 VITALS — BP 130/80 | HR 79 | Ht 61.0 in | Wt 190.0 lb

## 2024-03-31 DIAGNOSIS — E1169 Type 2 diabetes mellitus with other specified complication: Secondary | ICD-10-CM

## 2024-03-31 DIAGNOSIS — Z8673 Personal history of transient ischemic attack (TIA), and cerebral infarction without residual deficits: Secondary | ICD-10-CM

## 2024-03-31 DIAGNOSIS — Z720 Tobacco use: Secondary | ICD-10-CM

## 2024-03-31 DIAGNOSIS — I480 Paroxysmal atrial fibrillation: Secondary | ICD-10-CM

## 2024-03-31 DIAGNOSIS — Z952 Presence of prosthetic heart valve: Secondary | ICD-10-CM

## 2024-03-31 DIAGNOSIS — J449 Chronic obstructive pulmonary disease, unspecified: Secondary | ICD-10-CM

## 2024-03-31 DIAGNOSIS — I071 Rheumatic tricuspid insufficiency: Secondary | ICD-10-CM

## 2024-03-31 DIAGNOSIS — I5032 Chronic diastolic (congestive) heart failure: Secondary | ICD-10-CM | POA: Diagnosis not present

## 2024-03-31 DIAGNOSIS — I2510 Atherosclerotic heart disease of native coronary artery without angina pectoris: Secondary | ICD-10-CM

## 2024-03-31 DIAGNOSIS — I051 Rheumatic mitral insufficiency: Secondary | ICD-10-CM

## 2024-03-31 DIAGNOSIS — I4892 Unspecified atrial flutter: Secondary | ICD-10-CM | POA: Diagnosis not present

## 2024-03-31 DIAGNOSIS — E785 Hyperlipidemia, unspecified: Secondary | ICD-10-CM

## 2024-03-31 NOTE — Progress Notes (Signed)
 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   Past Medical History    Past Medical History:  Diagnosis Date   Allergy    Anemia    Anxiety    Aortic atherosclerosis    Arthritis    Asthma    Back pain    CAD (coronary artery disease)    Chronic headaches    Class 1 obesity due to excess calories with serious comorbidity in adult    Complication of anesthesia    slow to wake up   COPD (chronic obstructive pulmonary disease) (HCC)    Diabetes (HCC)    Fatty liver    GERD (gastroesophageal reflux disease)    Headache    Herniated lumbar intervertebral disc    High cholesterol    History of blood clots    Lumbago with sciatica, unspecified side    Mitral stenosis    severe by 05/18/21 echo   Moderate episode of recurrent major depressive disorder (HCC)    Nicotine  dependence, cigarettes, with other nicotine -induced disorders    Nonrheumatic aortic (valve) insufficiency    Other chronic pain    Pain in joint involving ankle and foot    Pancreatitis    Pneumonia    Presence urogenital implant    Severe mitral regurgitation    Sleep apnea    no cpap   Stroke (HCC)    mild   SUI (stress urinary incontinence, female)    Tricuspid regurgitation    Type 2 diabetes mellitus with hyperglycemia, with long-term current use of insulin  (HCC)    Past Surgical History:  Procedure Laterality Date   ANKLE SURGERY     x 3   Bladder tack      BREAST REDUCTION SURGERY     CARDIOVERSION N/A 07/02/2023   Procedure: CARDIOVERSION;  Surgeon: Barbaraann Darryle Ned, MD;  Location: [facility] INVASIVE CV LAB;  Service: Cardiovascular;  Laterality: N/A;   CARDIOVERSION N/A 01/18/2024   Procedure: CARDIOVERSION;  Surgeon: Santo Stanly LABOR, MD;  Location: [facility] INVASIVE CV LAB;  Service: Cardiovascular;  Laterality: N/A;   CHOLECYSTECTOMY  1998   HERNIA REPAIR     I & D EXTREMITY Right 10/28/2021   Procedure: IRRIGATION AND DEBRIDEMENT EXTREMITY, GREAT TOE;  Surgeon: Barton Drape, MD;  Location: [facility] OR;  Service: Orthopedics;  Laterality: Right;   MITRAL VALVE REPLACEMENT N/A 05/11/2023   Procedure: MITRAL VALVE (MV) REPLACEMENT USING MOSAIC VALVE SIZE ;  Surgeon: Maryjane Mt, MD;  Location: [facility] OR;  Service: Open Heart Surgery;  Laterality: N/A;   RIGHT/LEFT HEART CATH AND CORONARY ANGIOGRAPHY N/A 11/02/2022   Procedure: RIGHT/LEFT HEART CATH AND CORONARY ANGIOGRAPHY;  Surgeon: Wonda Sharper, MD;  Location: [facility] INVASIVE CV LAB;  Service: Cardiovascular;  Laterality: N/A;   TEE WITHOUT CARDIOVERSION N/A 11/02/2022   Procedure: TRANSESOPHAGEAL ECHOCARDIOGRAM;  Surgeon: Parchment Oneil BROCKS, MD;  Location: [facility] INVASIVE CV LAB;  Service: Cardiovascular;  Laterality: N/A;   TEE WITHOUT CARDIOVERSION N/A 05/11/2023   Procedure: TRANSESOPHAGEAL ECHOCARDIOGRAM;  Surgeon: Maryjane,  Deward, MD;  Location: [facility] OR;  Service: Open Heart Surgery;  Laterality: N/A;   TRANSESOPHAGEAL ECHOCARDIOGRAM (CATH LAB) N/A 07/02/2023   Procedure: TRANSESOPHAGEAL ECHOCARDIOGRAM;  Surgeon: Barbaraann Darryle Ned, MD;  Location: [facility] INVASIVE CV LAB;  Service: Cardiovascular;  Laterality: N/A;   TRANSESOPHAGEAL ECHOCARDIOGRAM (CATH LAB) N/A 01/18/2024   Procedure: TRANSESOPHAGEAL ECHOCARDIOGRAM;  Surgeon: Santo Stanly LABOR, MD;  Location: [facility] INVASIVE CV LAB;  Service: Cardiovascular;  Laterality: N/A;   VAGINAL HYSTERECTOMY  1988    Allergies  Allergies  Allergen  Reactions   Erythromycin Shortness Of Breath   Nickel Rash and Shortness Of Breath   Oxycodone  Shortness Of Breath   Meperidine Other (See Comments)    Confusion and tingling  (noted in H&P)    Nicotine  Hives and Itching   Codeine Swelling   Hydrocodone -Acetaminophen  Itching    Tolerated Norco 11/2018   Morphine Itching   Propoxyphene Other (See Comments)    Ineffective    Quetiapine     Restless leg syndrome     Labs/Other Studies Reviewed    The following studies were reviewed today:  Cardiac Studies & Procedures   ______________________________________________________________________________________________ CARDIAC CATHETERIZATION  CARDIAC CATHETERIZATION 11/02/2022  Conclusion Patent coronary arteries with mild nonobstructive plaquing in the RCA (Right dominant), no stenosis in the left main, LAD, or LCx Low cardiac output based on Fick measurement (question accuracy as patient seems well-compensated) Hemodynamic findings consistent with severe MS and severe MR (mean trans-mitral gradient 15 mmHg, calculated mitral valve area 0.7 square cm) and large V waves of 50 mmHg  Plan: Cardiac surgical consultation for mitral valve replacement  Findings Coronary Findings Diagnostic  Dominance: Right  Left Anterior Descending The vessel exhibits minimal luminal irregularities. The LAD terminates in the distal anterior wall. The first diagonal supplies a large territory. There is no stenosis in the LAD or diagonal branches.  Left Circumflex Vessel is angiographically normal. The circumflex is patent with no stenosis. The OM branches are patent.  Right Coronary Artery Prox RCA to Mid RCA lesion is 30% stenosed.  Intervention  No interventions have been documented.   STRESS TESTS  PCV MYOCARDIAL PERFUSION WO LEXISCAN 05/18/2021  Interpretation Summary Exercise nuclear stress test 05/18/2021: Normal ECG stress. The patient exercised for 4 minutes and 32 seconds of a  Bruce protocol, achieving approximately 4.64 METs. Reduced exercise tolerance. The blood pressure response was normal. Myocardial perfusion is normal. Overall LV systolic function is normal without regional wall motion abnormalities. Stress LV EF: 67%. Increased RV uptake suggests RVH. No previous exam available for comparison. Low risk study.   ECHOCARDIOGRAM  ECHOCARDIOGRAM COMPLETE 07/01/2023  Narrative ECHOCARDIOGRAM REPORT    Patient Name:   Rose [name] Date of Exam: 07/01/2023 Medical Rec #:  [id number]      Height:       63.0 in Accession #:    [id number]     Weight:       197.0 lb Date of Birth:  08-16-1955      BSA:          1.921 m Patient Age:    67 years       BP:           122/57 mmHg Patient Gender: F              HR:           72 bpm. Exam Location:  Inpatient  Procedure: 2D Echo, Cardiac Doppler and Color Doppler  Indications:    Arrhythmia  History:        Patient has prior history of Echocardiogram examinations, most recent 05/09/2023. Stroke and COPD, Mitral Valve Disease; Risk Factors:Sleep Apnea, Hypertension and Diabetes.  Mitral Valve: 29 mm Mosaic bioprosthetic valve valve is present in the mitral position. Procedure Date: 05/11/2023.  Sonographer:    Jayson Gaskins Referring Phys: [id number] ANASTASSIA DOUTOVA  IMPRESSIONS   1. Left ventricular ejection fraction, by estimation, is 55 to 60%. Left ventricular ejection fraction by PLAX is 60 %. The left ventricle has normal function. The left ventricle has no regional wall motion abnormalities. Left ventricular diastolic function could not be evaluated. 2. Right ventricular systolic function is moderately reduced. The right ventricular size is normal. There is mildly elevated pulmonary artery systolic pressure. The estimated right ventricular systolic pressure is 39.6 mmHg. 3. The mitral valve has been repaired/replaced. Trivial mitral valve regurgitation. The mean mitral valve gradient is 6.3 mmHg. There is a  29 mm Mosaic bioprosthetic valve present in the mitral position. Procedure Date: 05/11/2023. Echo findings are consistent with normal structure and function of the mitral valve prosthesis. 4. A small pericardial effusion is present. The pericardial effusion is posterior to the left ventricle. 5. The aortic valve was not well visualized. Aortic valve regurgitation is not visualized. Aortic valve sclerosis/calcification is present, without any evidence of aortic stenosis. Aortic valve mean gradient measures 7.0 mmHg. 6. The inferior vena cava is normal in size with <50% respiratory variability, suggesting right atrial pressure of 8 mmHg.  Comparison(s): Changes from prior study are noted. 05/09/2023: LVEF 60-65%, moderate to severe MR - trivial pericardial effusion. Compared to this study, the mitral valve has been replaced with a bioprothesis.  FINDINGS Left Ventricle: Left ventricular ejection fraction, by estimation, is 55 to 60%. Left ventricular ejection fraction by PLAX is 60 %. The left ventricle has normal function. The left ventricle has no regional wall motion abnormalities. The left ventricular internal cavity size was normal in size. There is no left ventricular hypertrophy. Left ventricular diastolic function could not be evaluated due to mitral valve replacement. Left ventricular diastolic function could not be evaluated.  Right Ventricle: The right ventricular size is normal. No increase in right ventricular wall thickness. Right ventricular systolic function is moderately reduced. There is mildly elevated pulmonary artery systolic pressure. The tricuspid regurgitant velocity is 2.81 m/s, and with an assumed right atrial pressure of 8 mmHg, the estimated right ventricular systolic pressure is 39.6 mmHg.  Left Atrium: Left atrial size was normal in size.  Right Atrium: Right atrial size was normal in size.  Pericardium: A small pericardial effusion is present. The pericardial effusion  is posterior to the left ventricle.  Mitral Valve: The mitral valve has been repaired/replaced. Trivial mitral valve regurgitation. There is a 29 mm Mosaic bioprosthetic valve present in the mitral position. Procedure Date: 05/11/2023. Echo findings are consistent with normal structure and function of the mitral valve prosthesis. MV peak gradient, 15.7 mmHg. The mean mitral valve gradient is 6.3 mmHg.  Tricuspid Valve: The tricuspid valve is grossly normal. Tricuspid valve regurgitation is mild.  Aortic Valve: The aortic valve was not well visualized. Aortic valve regurgitation is not visualized. Aortic valve sclerosis/calcification is present, without any evidence of aortic stenosis. Aortic valve mean gradient measures 7.0 mmHg. Aortic valve peak gradient measures 12.4 mmHg. Aortic valve area, by VTI measures 1.70 cm.  Pulmonic Valve: The pulmonic valve was normal in structure. Pulmonic valve regurgitation is not visualized.  Aorta: The aortic root and ascending  aorta are structurally normal, with no evidence of dilitation.  Venous: The inferior vena cava is normal in size with less than 50% respiratory variability, suggesting right atrial pressure of 8 mmHg.  IAS/Shunts: No atrial level shunt detected by color flow Doppler.   LEFT VENTRICLE PLAX 2D LV EF:         Left            Diastology ventricular     LV e' medial:    5.55 cm/s ejection        LV E/e' medial:  29.0 fraction by     LV e' lateral:   6.74 cm/s PLAX is 60      LV E/e' lateral: 23.9 %. LVIDd:         4.40 cm LVIDs:         3.00 cm LV PW:         0.80 cm LV IVS:        0.70 cm LVOT diam:     1.80 cm LV SV:         72 LV SV Index:   38 LVOT Area:     2.54 cm   RIGHT VENTRICLE RV S prime:     7.83 cm/s  LEFT ATRIUM             Index        RIGHT ATRIUM           Index LA Vol (A2C):   51.4 ml 26.75 ml/m  RA Area:     21.00 cm LA Vol (A4C):   50.8 ml 26.44 ml/m  RA Volume:   57.30 ml  29.83 ml/m LA  Biplane Vol: 51.9 ml 27.01 ml/m AORTIC VALVE AV Area (Vmax):    1.82 cm AV Area (Vmean):   1.87 cm AV Area (VTI):     1.70 cm AV Vmax:           176.00 cm/s AV Vmean:          131.000 cm/s AV VTI:            0.426 m AV Peak Grad:      12.4 mmHg AV Mean Grad:      7.0 mmHg LVOT Vmax:         126.00 cm/s LVOT Vmean:        96.200 cm/s LVOT VTI:          0.284 m LVOT/AV VTI ratio: 0.67  MITRAL VALVE                TRICUSPID VALVE MV Area (PHT): 3.68 cm     TR Peak grad:   31.6 mmHg MV Area VTI:   1.65 cm     TR Vmax:        281.00 cm/s MV Peak grad:  15.7 mmHg MV Mean grad:  6.3 mmHg     SHUNTS MV Vmax:       1.98 m/s     Systemic VTI:  0.28 m MV Vmean:      120.0 cm/s   Systemic Diam: 1.80 cm MV Decel Time: 206 msec MV E velocity: 161.00 cm/s MV A velocity: 107.00 cm/s MV E/A ratio:  1.50  Vinie Maxcy MD Electronically signed by Vinie Maxcy MD Signature Date/Time: 07/01/2023/1:07:43 PM    Final   TEE  ECHO TEE 01/18/2024  Narrative TRANSESOPHOGEAL ECHO REPORT    Patient Name:   Rose [name] Date of Exam: 01/18/2024 Medical Rec #:  [id number]      Height:       63.0 in Accession #:    [id number]     Weight:       193.4 lb Date of Birth:  1955/09/30      BSA:          1.906 m Patient Age:    68 years       BP:           126/88 mmHg Patient Gender: F              HR:           82 bpm. Exam Location:  Outpatient  Procedure: Transesophageal Echo, Color Doppler, Cardiac Doppler and 3D Echo (Both Spectral and Color Flow Doppler were utilized during procedure).  Indications:     atrial fibrillation  History:         Patient has prior history of Echocardiogram examinations, most recent 07/02/2023. CHF, CAD, COPD; Risk Factors:Current Smoker.  Mitral Valve: 29 mm bioprosthetic valve valve is present in the mitral position. Procedure Date: 05/11/23.  Sonographer:     Tinnie Barefoot RDCS Referring Phys:  [id number] CLINT R FENTON Diagnosing Phys: Stanly Leavens MD  PROCEDURE: The transesophogeal probe was passed without difficulty through the esophogus of the patient. Imaged were obtained with the patient in a left lateral decubitus position. Sedation performed by different physician. The patient was monitored while under deep sedation. Anesthestetic sedation was provided intravenously by Anesthesiology: 322mg  of Propofol , 100mg  of Lidocaine . The patient developed no complications during the procedure. A direct current cardioversion was performed at 200 joules with 1 attempt.  IMPRESSIONS   1. Left ventricular ejection fraction, by estimation, is 55 to 60%. The left ventricle has low normal function. 2. Right ventricular systolic function is normal. The right ventricular size is mildly enlarged. 3. Left atrial size was severely dilated. No left atrial/left atrial appendage thrombus was detected. The LAA emptying velocity was 53 cm/s. 4. Right atrial size was severely dilated. 5. The mitral valve has been repaired/replaced. No evidence of mitral valve regurgitation. No evidence of mitral stenosis. There is a 29 mm bioprosthetic valve present in the mitral position. Procedure Date: 05/11/23. 6. The tricuspid valve is has been repaired/replaced. Tricuspid valve regurgitation is severe. 7. The aortic valve is calcified. Aortic valve regurgitation is trivial. No aortic stenosis is present. 8. There is Moderate (Grade III) plaque involving the ascending aorta and descending aorta. 9. 3D performed of the mitral valve and demonstrates Normal prosthetic valve function.  Comparison(s): Increase in tricuspid regurgitation.  FINDINGS Left Ventricle: Left ventricular ejection fraction, by estimation, is 55 to 60%. The left ventricle has low normal function. The left ventricular internal cavity size was normal in size.  Right Ventricle: The right ventricular size is mildly enlarged. No increase in right ventricular wall thickness. Right ventricular  systolic function is normal.  Left Atrium: Left atrial size was severely dilated. No left atrial/left atrial appendage thrombus was detected. The LAA emptying velocity was 53 cm/s.  Right Atrium: Right atrial size was severely dilated.  Pericardium: There is no evidence of pericardial effusion.  Mitral Valve: The mitral valve has been repaired/replaced. Normal mobility of the mitral valve leaflets. No evidence of mitral valve regurgitation. There is a 29 mm bioprosthetic valve present in the mitral position. Procedure Date: 05/11/23. No evidence of mitral valve stenosis.  Tricuspid Valve: The tricuspid valve is has been repaired/replaced. Tricuspid valve regurgitation is severe. No evidence of  tricuspid stenosis.  Aortic Valve: The aortic valve is calcified. Aortic valve regurgitation is trivial. No aortic stenosis is present.  Pulmonic Valve: The pulmonic valve was grossly normal. Pulmonic valve regurgitation is not visualized.  Aorta: The aortic root and ascending aorta are structurally normal, with no evidence of dilitation. There is moderate (Grade III) plaque involving the ascending aorta and descending aorta.  IAS/Shunts: No atrial level shunt detected by color flow Doppler.  Additional Comments: 3D was performed not requiring image post processing on an independent workstation and was indeterminate.  Stanly Leavens MD Electronically signed by Stanly Leavens MD Signature Date/Time: 01/18/2024/3:14:21 PM    Final        ______________________________________________________________________________________________     Recent Labs: 07/01/2023: Magnesium  2.1 11/09/2023: B Natriuretic Peptide 138.6 01/14/2024: ALT 32; BUN 16; Creatinine, Ser 1.75; Hemoglobin 9.6; Platelets 226; Potassium 3.6; Sodium 140; TSH 5.060  Recent Lipid Panel    Component Value Date/Time   CHOL 158 12/13/2018 0647   TRIG 133 12/13/2018 0647   HDL 51 12/13/2018 0647   CHOLHDL 3.1  12/13/2018 0647   VLDL 27 12/13/2018 0647   LDLCALC 80 12/13/2018 0647    History of Present Illness    68 year old female with the above past medical history including mild nonobstructive CAD, 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   Cardiac catheterization in 10/2022 showed mild nonobstructive CAD.  She has a history of severe mitral valve stenosis, severe TR.  She was evaluated by CT surgery and ultimately underwent mitral valve replacement with 29 mm Mosaic bioprosthetic mitral valve as well as tricuspid valve repair in 04/2023.  She did develop postop atrial fibrillation.  She was started on Coumadin .  She was last seen in the office on 05/28/2023 was stable from a cardiac standpoint.  She was hospitalized in February 2025 in the setting of acute on chronic diastolic heart failure, atrial flutter with RVR. She underwent TEE/DCCV.  She was started on amiodarone  therapy.  Echocardiogram in 2/25 showed EF of 56%, normal LV function, moderately reduced RV systolic function, mildly elevated PASP, stable mitral valve replacement, small pericardial effusion.  She was evaluated in the ED in 07/2023 in the setting of shortness of breath, chest pain.  EKG showed rate controlled atrial fibrillation. Troponin was negative, chest x-ray was unremarkable. She was evaluated in the ED on 11/09/2023 in the setting of chest pain. EKG showed atrial flutter, troponins negative x 2. Chest x-ray was unremarkable. At her follow-up visit in 7/25 she was noted to be in rate controlled atrial fibrillation. She was referred to the a fib clinic for ongoing management and underwent TEE/DCCV in 12/2023 with restoration of sinus rhythm.  TEE showed EF 55 to 60%, low normal LV function, normal RV systolic  function, severe BAE, stable mitral valve replacement, severe tricuspid valve regurgitation, trivial aortic valve regurgitation, moderate (grade 3) plaque involving the ascending aorta and ascending aorta.  She was referred to EP and was last seen in office on 03/20/2024. She was maintaining sinus rhythm. She was not felt to be a good candidate for catheter ablation. It was noted that her CKD would likely limit the possibility of Tikosyn, structural heart disease with prohibit class Ic agents.   She presents today for follow-up. Since her last visit she has been stable from a cardiac standpoint.  She notes chronic dyspnea on exertion, unchanged from prior visits, intermittent fleeting chest discomfort.  She denies any palpitations, dizziness, edema, PND, orthopnea, weight gain.  She is currently being treated for bronchitis.   Home Medications    Current Outpatient Medications  Medication Sig Dispense Refill   albuterol  (VENTOLIN  HFA) 108 (90 Base) MCG/ACT inhaler Inhale 1-2 puffs into the lungs every 6 (six) hours as needed for wheezing or shortness of breath. 18 g 2   amiodarone  (PACERONE ) 200 MG tablet Take 1 tablet (200 mg total) by mouth daily. 90 tablet 3   amoxicillin  (AMOXIL ) 500 MG tablet Prophylaxis for dental procedure. Take 2 grams (4x 500mg  tablets) 1 hour before procedure.     amoxicillin -clavulanate (AUGMENTIN ) 875-125 MG tablet Take 1 tablet by mouth 2 (two) times daily. 14 tablet 0   clonazePAM  (KLONOPIN ) 1 MG tablet Take 1 mg by mouth 3 (three) times daily.     clotrimazole (MYCELEX) 10 MG troche Take 10 mg by mouth 4 (four) times daily as needed.     furosemide  (LASIX ) 20 MG tablet Take 2 tablets (40 mg total) by mouth daily. 180 tablet 3   gabapentin  (NEURONTIN ) 800 MG tablet Take 0.5 tablets (400 mg total) by mouth 3 (three) times daily. (Patient taking differently: Take 800 mg by mouth 3 (three) times daily.)     metFORMIN  (GLUCOPHAGE ) 500 MG tablet Take 1,000 mg by mouth 2  (two) times daily.     methocarbamol  (ROBAXIN ) 500 MG tablet Take 500 mg by mouth every 8 (eight) hours as needed (pain/muscle spasms).     Multiple Vitamin (MULTIVITAMIN WITH MINERALS) TABS tablet Take 1 tablet by mouth in the morning. Centrum     omeprazole (PRILOSEC) 40 MG capsule Take 40 mg by mouth daily before breakfast.     ondansetron  (ZOFRAN ) 4 MG tablet Take 4 mg by mouth every 8 (eight) hours as needed for nausea or vomiting.     OXYGEN Inhale 2 L into the lungs continuous.     Pancrelipase , Lip-Prot-Amyl, (CREON ) 24000-76000 units CPEP Take 1 capsule by mouth with breakfast, with lunch, and with evening meal.     potassium chloride  (KLOR-CON ) 10 MEQ tablet Take 10 mEq by mouth in the morning.     predniSONE  (DELTASONE ) 10 MG tablet Take 4 tablets (40 mg total) by mouth daily for 3 days, THEN 3 tablets (30 mg total) daily for 3 days, THEN 2 tablets (20 mg total) daily for 3 days, THEN 1 tablet (10 mg total) daily for 3 days. 30 tablet 0   rOPINIRole  (REQUIP ) 1 MG tablet Take 3 mg by mouth at bedtime.     rosuvastatin  (CRESTOR ) 40 MG tablet Take 1 tablet (40 mg total) by mouth at bedtime. 90 tablet 2   sertraline  (ZOLOFT ) 50 MG tablet Take 50 mg by mouth every morning.     Tiotropium Bromide-Olodaterol (STIOLTO RESPIMAT ) 2.5-2.5 MCG/ACT AERS Inhale 2 puffs into the lungs daily. 4 g 11   traZODone  (DESYREL ) 100 MG tablet Take 100-200 mg by mouth at bedtime.     warfarin (COUMADIN ) 2.5 MG tablet TAKE 1 TO 1&1/2 TABLETS BY MOUTH EVERY DAY AS DIRECTED BY COUMADIN  CLINIC 40 tablet 3   No current facility-administered medications for this visit.     Review of Systems    She denies palpitations, pnd, orthopnea, n, v, dizziness, syncope, edema, weight gain, or early satiety. All other systems reviewed and are otherwise negative except as noted above.   Physical Exam  VS:  BP 130/80 (BP Location: Left Arm, Patient Position: Sitting, Cuff Size: Normal)   Pulse 79   Ht 5' 1 (1.549 m)    Wt 190 lb (86.2 kg)   SpO2 90%   BMI 35.90 kg/m   GEN: Well nourished, well developed, in no acute distress. HEENT: normal. Neck: Supple, no JVD, carotid bruits, or masses. Cardiac: RRR, no murmurs, rubs, or gallops. No clubbing, cyanosis, edema.  Radials/DP/PT 2+ and equal bilaterally.  Respiratory:  Respirations regular and unlabored, clear to auscultation bilaterally. GI: Soft, nontender, nondistended, BS + x 4. MS: no deformity or atrophy. Skin: warm and dry, no rash. Neuro:  Strength and sensation are intact. Psych: Normal affect.  Accessory Clinical Findings    ECG personally reviewed by me today -    - no EKG in office today.   Lab Results  Component Value Date   WBC 4.9 01/14/2024   HGB 9.6 (L) 01/14/2024   HCT 33.1 (L) 01/14/2024   MCV 74 (L) 01/14/2024   PLT 226 01/14/2024   Lab Results  Component Value Date   CREATININE 1.75 (H) 01/14/2024   BUN 16 01/14/2024   NA 140 01/14/2024   K 3.6 01/14/2024   CL 97 01/14/2024   CO2 24 01/14/2024   Lab Results  Component Value Date   ALT 32 01/14/2024   AST 40 01/14/2024   ALKPHOS 128 (H) 01/14/2024   BILITOT 0.3 01/14/2024   Lab Results  Component Value Date   CHOL 158 12/13/2018   HDL 51 12/13/2018   LDLCALC 80 12/13/2018   TRIG 133 12/13/2018   CHOLHDL 3.1 12/13/2018    Lab Results  Component Value Date   HGBA1C 6.3 (H) 06/30/2023    Assessment & Plan   1. CAD/chest pain: Cardiac catheterization in 10/2022 showed mild nonobstructive CAD.  She reports intermittent chest discomfort, unchanged from prior visits.  No indication for ischemic evaluation.  Continue Crestor .   2. Persistent atrial fibrillation/atrial flutter: S/p DCCV in 06/2023 and again in 12/2023.  Maintaining sinus rhythm on exam.  She denies any recent palpitations, denies bleeding. Chest x-ray was stable 10/2023. Most recent labs including TSH, CBC, CMET were stable.  She will be due for repeat labs at next follow-up visit.  Following with  EP/A-fib clinic.  It was noted at her recent visit with the EP in 02/2024 at that she was not a good candidate for catheter ablation. It was noted that her CKD would likely limit the possibility of Tikosyn, structural heart disease with prohibit class Ic agents. Continue amiodarone , warfarin.   3. Chronic diastolic heart failure: Echocardiogram in 2/25 showed EF of 56%, normal LV function, moderately reduced RV systolic function, mildly elevated PASP, stable mitral valve replacement, small pericardial effusion. TEE in 12/2023 showed EF 55 to 60%, low normal LV function, normal RV systolic function, severe BAE, stable mitral valve replacement, severe tricuspid valve regurgitation, trivial aortic valve regurgitation, moderate (grade 3) plaque involving the ascending aorta and ascending aorta. She reports stable chronic dyspnea on exertion. Euvolemic and well compensated on exam. Continue Lasix .   3. Severe mitral valve stenosis/tricuspid valve regurgitation:  S/p MVR, TV repair in 04/2023.  She reports stable chronic dyspnea, unchanged from prior visits.  Generally euvolemic and well compensated on exam.  Most recent echo/TEE as above.  Consider repeat echocardiogram in 12/2024, sooner if clinically indicated.  Continue SBE prophylaxis.   4. Hyperlipidemia: No recent LDL on file.  Consider repeat fasting lipids at follow-up.  Continue Crestor .    5. History of CVA: Continue warfarin, Crestor .   6. Type 2 diabetes: A1c was 6.3 in 06/2023. Monitored and managed per PCP.   7. COPD/tobacco use:  She continues to smoke, she is not interested in quitting at this time.  She reports stable chronic dyspnea. Followed by pulmonology.   8. Disposition: Follow-up in 3 months with Dr. Jeffrie, 6 months with EP.        Damien JAYSON Braver, NP 03/31/2024, 2:22 PM

## 2024-03-31 NOTE — Patient Instructions (Addendum)
 Medication Instructions:  Your physician recommends that you continue on your current medications as directed. Please refer to the Current Medication list given to you today.  *If you need a refill on your cardiac medications before your next appointment, please call your pharmacy*  Lab Work: NONE ordered at this time of appointment   Testing/Procedures: NONE ordered at this time of appointment   Follow-Up: At [facility], you and your health needs are our priority.  As part of our continuing mission to provide you with exceptional heart care, our providers are all part of one team.  This team includes your primary Cardiologist (physician) and Advanced Practice Providers or APPs (Physician Assistants and Nurse Practitioners) who all work together to provide you with the care you need, when you need it.  Your next appointment:   3 month(s)  Provider:   Oneil Parchment, MD    We recommend signing up for the patient portal called MyChart.  Sign up information is provided on this After Visit Summary.  MyChart is used to connect with patients for Virtual Visits (Telemedicine).  Patients are able to view lab/test results, encounter notes, upcoming appointments, etc.  Non-urgent messages can be sent to your provider as well.   To learn more about what you can do with MyChart, go to [email or web address].

## 2024-04-02 ENCOUNTER — Other Ambulatory Visit: Payer: Self-pay | Admitting: Nurse Practitioner

## 2024-04-02 DIAGNOSIS — F1021 Alcohol dependence, in remission: Secondary | ICD-10-CM

## 2024-04-03 ENCOUNTER — Other Ambulatory Visit: Payer: Self-pay

## 2024-04-03 DIAGNOSIS — R911 Solitary pulmonary nodule: Secondary | ICD-10-CM

## 2024-04-11 ENCOUNTER — Other Ambulatory Visit: Payer: Self-pay | Admitting: Internal Medicine

## 2024-04-11 ENCOUNTER — Ambulatory Visit
Admission: RE | Admit: 2024-04-11 | Discharge: 2024-04-11 | Disposition: A | Discharge status: Home / Self Care | Source: Ambulatory Visit | Attending: Nurse Practitioner | Admitting: Nurse Practitioner

## 2024-04-11 DIAGNOSIS — N179 Acute kidney failure, unspecified: Secondary | ICD-10-CM

## 2024-04-11 DIAGNOSIS — F1021 Alcohol dependence, in remission: Secondary | ICD-10-CM

## 2024-04-17 ENCOUNTER — Ambulatory Visit

## 2024-04-18 ENCOUNTER — Ambulatory Visit

## 2024-04-18 ENCOUNTER — Telehealth: Payer: Self-pay | Admitting: *Deleted

## 2024-04-18 NOTE — Telephone Encounter (Signed)
 Patient called to reschedule per her transportation and she still feels sick. She states she will be able to come after Thanksgiving on a Thursday.

## 2024-04-21 ENCOUNTER — Other Ambulatory Visit

## 2024-04-21 ENCOUNTER — Ambulatory Visit: Admitting: Physician Assistant

## 2024-04-22 ENCOUNTER — Ambulatory Visit: Admitting: Obstetrics and Gynecology

## 2024-04-29 ENCOUNTER — Other Ambulatory Visit

## 2024-05-01 ENCOUNTER — Ambulatory Visit

## 2024-05-05 ENCOUNTER — Telehealth: Payer: Self-pay

## 2024-05-05 ENCOUNTER — Ambulatory Visit: Admitting: Primary Care

## 2024-05-05 DIAGNOSIS — J9611 Chronic respiratory failure with hypoxia: Secondary | ICD-10-CM

## 2024-05-05 DIAGNOSIS — J449 Chronic obstructive pulmonary disease, unspecified: Secondary | ICD-10-CM

## 2024-05-05 NOTE — Telephone Encounter (Signed)
 I called and spoke to pt. Pt informed of the need to reschedule appt due to weather and agreed to rescheduled her appt. Pt has been moved. NFN

## 2024-05-05 NOTE — Telephone Encounter (Signed)
 Atc x1. Lmtcb.   I tried calling pt to see if she wanted to move her appt to Tuesday, 05-06-24 at either 11:30 or 1 pm due to the weather.   Okay to use same day slot per BW

## 2024-05-05 NOTE — Telephone Encounter (Signed)
 Atc x2. lmtcb

## 2024-05-07 ENCOUNTER — Encounter: Payer: Self-pay | Admitting: Acute Care

## 2024-05-12 ENCOUNTER — Ambulatory Visit: Admitting: Primary Care

## 2024-05-12 DIAGNOSIS — J9611 Chronic respiratory failure with hypoxia: Secondary | ICD-10-CM

## 2024-05-12 DIAGNOSIS — J449 Chronic obstructive pulmonary disease, unspecified: Secondary | ICD-10-CM

## 2024-05-13 ENCOUNTER — Emergency Department (HOSPITAL_COMMUNITY)

## 2024-05-13 ENCOUNTER — Other Ambulatory Visit: Payer: Self-pay | Admitting: Internal Medicine

## 2024-05-13 ENCOUNTER — Emergency Department (HOSPITAL_COMMUNITY): Admission: EM | Admit: 2024-05-13 | Discharge: 2024-05-14 | Disposition: A

## 2024-05-13 ENCOUNTER — Other Ambulatory Visit: Payer: Self-pay

## 2024-05-13 ENCOUNTER — Encounter (HOSPITAL_COMMUNITY): Payer: Self-pay

## 2024-05-13 DIAGNOSIS — K861 Other chronic pancreatitis: Secondary | ICD-10-CM | POA: Insufficient documentation

## 2024-05-13 DIAGNOSIS — J449 Chronic obstructive pulmonary disease, unspecified: Secondary | ICD-10-CM | POA: Insufficient documentation

## 2024-05-13 DIAGNOSIS — N179 Acute kidney failure, unspecified: Secondary | ICD-10-CM

## 2024-05-13 DIAGNOSIS — R002 Palpitations: Secondary | ICD-10-CM | POA: Diagnosis not present

## 2024-05-13 DIAGNOSIS — R059 Cough, unspecified: Secondary | ICD-10-CM | POA: Insufficient documentation

## 2024-05-13 DIAGNOSIS — M545 Low back pain, unspecified: Secondary | ICD-10-CM | POA: Insufficient documentation

## 2024-05-13 DIAGNOSIS — R350 Frequency of micturition: Secondary | ICD-10-CM | POA: Insufficient documentation

## 2024-05-13 DIAGNOSIS — G8929 Other chronic pain: Secondary | ICD-10-CM | POA: Insufficient documentation

## 2024-05-13 DIAGNOSIS — Z7951 Long term (current) use of inhaled steroids: Secondary | ICD-10-CM | POA: Insufficient documentation

## 2024-05-13 DIAGNOSIS — Z7901 Long term (current) use of anticoagulants: Secondary | ICD-10-CM | POA: Diagnosis not present

## 2024-05-13 LAB — CBC WITH DIFFERENTIAL/PLATELET
Abs Immature Granulocytes: 0.03 K/uL (ref 0.00–0.07)
Basophils Absolute: 0 K/uL (ref 0.0–0.1)
Basophils Relative: 0 %
Eosinophils Absolute: 0 K/uL (ref 0.0–0.5)
Eosinophils Relative: 1 %
HCT: 32.4 % — ABNORMAL LOW (ref 36.0–46.0)
Hemoglobin: 10.3 g/dL — ABNORMAL LOW (ref 12.0–15.0)
Immature Granulocytes: 1 %
Lymphocytes Relative: 11 %
Lymphs Abs: 0.7 K/uL (ref 0.7–4.0)
MCH: 25.2 pg — ABNORMAL LOW (ref 26.0–34.0)
MCHC: 31.8 g/dL (ref 30.0–36.0)
MCV: 79.2 fL — ABNORMAL LOW (ref 80.0–100.0)
Monocytes Absolute: 0.4 K/uL (ref 0.1–1.0)
Monocytes Relative: 6 %
Neutro Abs: 5.4 K/uL (ref 1.7–7.7)
Neutrophils Relative %: 81 %
Platelets: 192 K/uL (ref 150–400)
RBC: 4.09 MIL/uL (ref 3.87–5.11)
RDW: 19.7 % — ABNORMAL HIGH (ref 11.5–15.5)
WBC: 6.6 K/uL (ref 4.0–10.5)
nRBC: 0 % (ref 0.0–0.2)

## 2024-05-13 LAB — BASIC METABOLIC PANEL WITH GFR
Anion gap: 12 (ref 5–15)
BUN: 17 mg/dL (ref 8–23)
CO2: 26 mmol/L (ref 22–32)
Calcium: 8.7 mg/dL — ABNORMAL LOW (ref 8.9–10.3)
Chloride: 101 mmol/L (ref 98–111)
Creatinine, Ser: 1.91 mg/dL — ABNORMAL HIGH (ref 0.44–1.00)
GFR, Estimated: 28 mL/min — ABNORMAL LOW (ref >60)
Glucose, Bld: 109 mg/dL — ABNORMAL HIGH (ref 70–99)
Potassium: 3.6 mmol/L (ref 3.5–5.1)
Sodium: 138 mmol/L (ref 135–145)

## 2024-05-13 LAB — URINALYSIS, ROUTINE W REFLEX MICROSCOPIC
Bilirubin Urine: NEGATIVE
Glucose, UA: >=500 mg/dL — AB
Hgb urine dipstick: NEGATIVE
Ketones, ur: NEGATIVE mg/dL
Leukocytes,Ua: NEGATIVE
Nitrite: NEGATIVE
Protein, ur: NEGATIVE mg/dL
Specific Gravity, Urine: 1.02 (ref 1.005–1.030)
pH: 5.5 (ref 5.0–8.0)

## 2024-05-13 LAB — URINALYSIS, MICROSCOPIC (REFLEX)

## 2024-05-13 LAB — RESP PANEL BY RT-PCR (RSV, FLU A&B, COVID)  RVPGX2
Influenza A by PCR: NEGATIVE
Influenza B by PCR: NEGATIVE
Resp Syncytial Virus by PCR: NEGATIVE
SARS Coronavirus 2 by RT PCR: NEGATIVE

## 2024-05-13 NOTE — ED Provider Triage Note (Signed)
 Emergency Medicine Provider Triage Evaluation Note  Rose [name] , a 68 y.o. female  was evaluated in triage.  Pt complains of back pain/dysuria/palpitations/cough/congestion/night sweats.  Patient notes that she was recently treated for a suspected kidney infection, she was taking an antibiotic but cannot remove the name of this medication.  She does not feel as though the kidney infection never resolved, as she is still complaining of left flank pain as well as subjective fevers at home and dysuria.  She also endorses nasal congestion and a persistent cough.  Patient has a history of a mitral valve replacement and is on warfarin, she also notes that she has a history of atrial fibrillation requiring cardioversion.  No chest pain.  Review of Systems  Positive: As above Negative: As above  Physical Exam  BP 134/75   Pulse (!) 106   Temp 98.6 F (37 C)   Resp 20   Ht 5' 2 (1.575 m)   Wt 88.9 kg   SpO2 92%   BMI 35.85 kg/m  Gen:   Awake, no distress   Resp:  Normal effort  MSK:   Moves extremities without difficulty  Other:  Left flank pain/CVA tenderness  Medical Decision Making  Medically screening exam initiated at 6:07 PM.  Appropriate orders placed.  Rose [name] was informed that the remainder of the evaluation will be completed by another provider, this initial triage assessment does not replace that evaluation, and the importance of remaining in the ED until their evaluation is complete.     Rose [name] SAILOR, [place] 05/13/24 1810

## 2024-05-13 NOTE — ED Triage Notes (Signed)
 C/O lower back pain for a couple of weeks/ burning/ painful urination. C/O palpations for 2 weeks. Pt has a cough for a month. Axox4. C/O [facility]

## 2024-05-14 ENCOUNTER — Ambulatory Visit

## 2024-05-14 MED ORDER — LIDOCAINE 5 % EX PTCH: 1.0000 | MEDICATED_PATCH | CUTANEOUS | 0 refills | Status: AC

## 2024-05-14 MED ORDER — LIDOCAINE 5 % EX PTCH
1.0000 | MEDICATED_PATCH | CUTANEOUS | Status: DC
  Administered 2024-05-14: 06:00:00 1 via TRANSDERMAL
  Filled 2024-05-14: qty 1

## 2024-05-14 MED ORDER — LIDOCAINE 5 % EX PTCH
1.0000 | MEDICATED_PATCH | CUTANEOUS | Status: DC
  Administered 2024-05-14: 05:00:00 1 via TRANSDERMAL
  Filled 2024-05-14 (×2): qty 1

## 2024-05-14 NOTE — Discharge Instructions (Addendum)
 Use your lidocaine  patches as needed.  You can also take Tylenol  as needed for pain.  Stay any other medications as prescribed.  Follow-up with your primary care doctor in 1 to 2 weeks.  Return to the ER for new or worsening symptoms.

## 2024-05-14 NOTE — ED Provider Notes (Signed)
 [place] EMERGENCY DEPARTMENT AT [facility] Provider Note   CSN: [id number] Arrival date & time: 05/13/24  1656     Patient presents with: Back Pain and Palpitations   Rose [name] is a 68 y.o. female.   68 year old female with history of A-fib and on 2 L nasal cannula at baseline for COPD presents for evaluation of palpitations.  States has been about for 2 weeks.  States he is also had a few weeks of bilateral lower back pain.  She states she has been compliant with her medications.  She denies any falls or injury.  States she has also had some increased urinary frequency as well.  Denies any other symptoms or concerns.   Back Pain Associated symptoms: no abdominal pain, no chest pain, no dysuria and no fever   Palpitations Associated symptoms: back pain   Associated symptoms: no chest pain, no cough, no shortness of breath and no vomiting        Prior to Admission medications  Medication Sig Start Date End Date Taking? Authorizing Provider  lidocaine  (LIDODERM ) 5 % Place 1 patch onto the skin daily. Remove & Discard patch within 12 hours or as directed by MD 05/14/24  Yes Thomasene Dubow L, DO  albuterol  (VENTOLIN  HFA) 108 (90 Base) MCG/ACT inhaler Inhale 1-2 puffs into the lungs every 6 (six) hours as needed for wheezing or shortness of breath. 02/05/24   Shelah Lamar RAMAN, MD  amiodarone  (PACERONE ) 200 MG tablet Take 1 tablet (200 mg total) by mouth daily. 03/20/24   Kennyth Chew, MD  amoxicillin  (AMOXIL ) 500 MG tablet Prophylaxis for dental procedure. Take 2 grams (4x 500mg  tablets) 1 hour before procedure. 12/12/23   Daneen Damien BROCKS, NP  amoxicillin -clavulanate (AUGMENTIN ) 875-125 MG tablet Take 1 tablet by mouth 2 (two) times daily. 03/26/24   Hope Almarie ORN, NP  clonazePAM  (KLONOPIN ) 1 MG tablet Take 1 mg by mouth 3 (three) times daily.    [provider]  clotrimazole (MYCELEX) 10 MG troche Take 10 mg by mouth 4 (four) times daily as needed.  05/25/23   [provider]  furosemide  (LASIX ) 20 MG tablet Take 2 tablets (40 mg total) by mouth daily. 07/27/23   Jeffrie Oneil BROCKS, MD  gabapentin  (NEURONTIN ) 800 MG tablet Take 0.5 tablets (400 mg total) by mouth 3 (three) times daily. Patient taking differently: Take 800 mg by mouth 3 (three) times daily. 07/03/23   Fairy Frames, MD  metFORMIN  (GLUCOPHAGE ) 500 MG tablet Take 1,000 mg by mouth 2 (two) times daily. 03/05/19   [provider]  methocarbamol  (ROBAXIN ) 500 MG tablet Take 500 mg by mouth every 8 (eight) hours as needed (pain/muscle spasms).    [provider]  Multiple Vitamin (MULTIVITAMIN WITH MINERALS) TABS tablet Take 1 tablet by mouth in the morning. Centrum    [provider]  omeprazole (PRILOSEC) 40 MG capsule Take 40 mg by mouth daily before breakfast.    [provider]  ondansetron  (ZOFRAN ) 4 MG tablet Take 4 mg by mouth every 8 (eight) hours as needed for nausea or vomiting.    [provider]  OXYGEN Inhale 2 L into the lungs continuous.    [provider]  Pancrelipase , Lip-Prot-Amyl, (CREON ) 24000-76000 units CPEP Take 1 capsule by mouth with breakfast, with lunch, and with evening meal.    [provider]  potassium chloride  (KLOR-CON ) 10 MEQ tablet Take 10 mEq by mouth in the morning.    [provider]  rOPINIRole  (REQUIP ) 1 MG tablet Take 3 mg by mouth at bedtime.    [provider]  rosuvastatin  (CRESTOR ) 40 MG tablet Take 1 tablet (40 mg total) by mouth at bedtime. 09/20/23   Jeffrie Oneil BROCKS, MD  sertraline  (ZOLOFT ) 50 MG tablet Take 50 mg by mouth every morning. 01/11/23   [provider]  Tiotropium Bromide-Olodaterol (STIOLTO RESPIMAT ) 2.5-2.5 MCG/ACT AERS Inhale 2 puffs into the lungs daily. 03/26/24   Hope Almarie ORN, NP  traZODone  (DESYREL ) 100 MG tablet Take 100-200 mg by mouth at bedtime.    [provider]  warfarin (COUMADIN ) 2.5 MG tablet TAKE 1 TO  1&1/2 TABLETS BY MOUTH EVERY DAY AS DIRECTED BY COUMADIN  CLINIC 02/18/24   Jeffrie Oneil BROCKS, MD    Allergies: Erythromycin, Nickel, Oxycodone , Meperidine, Nicotine , Codeine, Hydrocodone -acetaminophen , Morphine, Propoxyphene, and Quetiapine    Review of Systems  Constitutional:  Negative for chills and fever.  HENT:  Negative for ear pain and sore throat.   Eyes:  Negative for pain and visual disturbance.  Respiratory:  Negative for cough and shortness of breath.   Cardiovascular:  Positive for palpitations. Negative for chest pain.  Gastrointestinal:  Negative for abdominal pain and vomiting.  Genitourinary:  Negative for dysuria and hematuria.  Musculoskeletal:  Positive for back pain. Negative for arthralgias.  Skin:  Negative for color change and rash.  Neurological:  Negative for seizures and syncope.  All other systems reviewed and are negative.   Updated Vital Signs BP (!) 155/77 (BP Location: Right Arm)   Pulse 82   Temp 98 F (36.7 C) (Oral)   Resp 20   Ht 5' 2 (1.575 m)   Wt 88.9 kg   SpO2 100%   BMI 35.85 kg/m   Physical Exam Vitals and nursing note reviewed.  Constitutional:      General: She is not in acute distress.    Appearance: Normal appearance. She is well-developed. She is obese. She is not ill-appearing.  HENT:     Head: Normocephalic and atraumatic.  Eyes:     Conjunctiva/sclera: Conjunctivae normal.  Cardiovascular:     Rate and Rhythm: Normal rate and regular rhythm.     Heart sounds: No murmur heard. Pulmonary:     Effort: Pulmonary effort is normal. No respiratory distress.     Breath sounds: Normal breath sounds.  Abdominal:     Palpations: Abdomen is soft.     Tenderness: There is no abdominal tenderness.  Musculoskeletal:        General: Tenderness present. No swelling.     Cervical back: Neck supple.     Comments: Bilateral lower back musculature tenderness to palpation   Skin:    General: Skin is warm and dry.     Capillary Refill:  Capillary refill takes less than 2 seconds.  Neurological:     Mental Status: She is alert.  Psychiatric:        Mood and Affect: Mood normal.     (all labs ordered are listed, but only abnormal results are displayed) Labs Reviewed  CBC WITH DIFFERENTIAL/PLATELET - Abnormal; Notable for the following components:      Result Value   Hemoglobin 10.3 (*)    HCT 32.4 (*)    MCV 79.2 (*)    MCH 25.2 (*)    RDW 19.7 (*)    All other components within normal limits  BASIC METABOLIC PANEL WITH GFR - Abnormal; Notable for the following components:   Glucose, Bld 109 (*)  Creatinine, Ser 1.91 (*)    Calcium  8.7 (*)    GFR, Estimated 28 (*)    All other components within normal limits  URINALYSIS, ROUTINE W REFLEX MICROSCOPIC - Abnormal; Notable for the following components:   Glucose, UA >=500 (*)    All other components within normal limits  URINALYSIS, MICROSCOPIC (REFLEX) - Abnormal; Notable for the following components:   Bacteria, UA RARE (*)    All other components within normal limits  RESP PANEL BY RT-PCR (RSV, FLU A&B, COVID)  [id number]    EKG: EKG Interpretation Date/Time:  Tuesday May 13 2024 18:18:41 EST Ventricular Rate:  96 PR Interval:    QRS Duration:  90 QT Interval:  398 QTC Calculation: 502 R Axis:   56  Text Interpretation: Atrial flutter with variable A-V block Prolonged QT Compared with prior EKG from 03/20/2024 Confirmed by Gennaro Bouchard ([id number]) on 05/14/2024 4:41:27 AM  Radiology: CT ABDOMEN PELVIS WO CONTRAST Result Date: 05/13/2024 EXAM: CT ABDOMEN AND PELVIS WITHOUT CONTRAST 05/13/2024 07:59:54 PM TECHNIQUE: CT of the abdomen and pelvis was performed without the administration of intravenous contrast. Multiplanar reformatted images are provided for review. Automated exposure control, iterative reconstruction, and/or weight-based adjustment of the mA/kV was utilized to reduce the radiation dose to as low as reasonably achievable. COMPARISON: Liver  ultrasound 04/11/2024, MRI abdomen 07/06/2017, and CT abdomen and pelvis 09/19/2016. CLINICAL HISTORY: Suspected pyelonephritis. FINDINGS: LOWER CHEST: Atelectasis and scarring in the lung bases. Scattered lower lung pulmonary nodules, for example, right middle lung series 5 image 4, measuring 4 mm diameter. No imaging follow-up is indicated in a low-risk patient. Cardiac enlargement. LIVER: Increased density demonstrated throughout the liver. This may represent physiologic or metabolic change or possibly iron deposition in the appropriate clinical setting. No focal lesions. GALLBLADDER AND BILE DUCTS: The gallbladder is surgically absent. No biliary ductal dilatation. SPLEEN: No acute abnormality. PANCREAS: Diffuse calcification throughout the pancreas consistent with chronic pancreatitis. No acute inflammatory changes. ADRENAL GLANDS: The adrenal glands are normal. KIDNEYS, URETERS AND BLADDER: The kidneys, ureters, and bladder are normal. No stones in the kidneys or ureters. No hydronephrosis. No perinephric or periureteral stranding. GI AND BOWEL: Stomach, small bowel, colon, and appendix are normal. There is no bowel obstruction. PERITONEUM AND RETROPERITONEUM: No ascites. No free air. VASCULATURE: Calcification of the aorta. No aneurysm. Aorta is normal in caliber. LYMPH NODES: No significant lymphadenopathy. REPRODUCTIVE ORGANS: No acute abnormality. BONES AND SOFT TISSUES: Degenerative changes in the spine. Spondylolysis with mild spondylolisthesis at L5-S1. Postoperative changes with mesh hernia repair in the anterior abdominal wall. No acute osseous abnormality. No focal soft tissue abnormality. IMPRESSION: 1. No CT evidence of pyelonephritis. Although CT imaging of pyelonephritis is limited, particularly without contrast material. 2. No acute findings in the abdomen or pelvis. 3. Increased density of the liver parenchyma may be physiologic or could indicate deposition disease. Electronically signed by:  Elsie Gravely MD 05/13/2024 08:08 PM EST RP Workstation: [id number]   DG Chest 2 View Result Date: 05/13/2024 CLINICAL DATA:  Cough. EXAM: CHEST - 2 VIEW COMPARISON:  Chest radiograph dated 11/09/2023. FINDINGS: No focal consolidation, pleural effusion, or pneumothorax. Stable cardiac silhouette. No acute osseous pathology. IMPRESSION: No active cardiopulmonary disease. Electronically Signed   By: Vanetta Chou M.D.   On: 05/13/2024 19:01     Procedures   Medications Ordered in the ED  lidocaine  (LIDODERM ) 5 % 1 patch (1 patch Transdermal Patch Applied 05/14/24 0458)  lidocaine  (LIDODERM ) 5 % 1 patch (has no administration in  time range)                                    Medical Decision Making Patient with largely negative workup including negative lab work, EKG that shows a flutter but no acute abnormality and normal x-ray.  I think her pain is likely musculoskeletal in her back.  Will give her prescription for lidocaine  patches.  She is given lidocaine  patch in the ER.  Advised to stay on her other medications as prescribed and follow-up with her primary care doctor and specialist in 1 to 2 weeks.  Advised return for any new or worsening symptoms.  She feels comfortable with the plan to be discharged home.  Problems Addressed: Chronic bilateral low back pain without sciatica: chronic illness or injury with exacerbation, progression, or side effects of treatment Palpitations: undiagnosed new problem with uncertain prognosis  Amount and/or Complexity of Data Reviewed External Data Reviewed: notes.    Details: Prior hospital records reviewed patient last admitted 01-18-2024 for persistent A-fib Labs: ordered. Decision-making details documented in ED Course.    Details: Ordered and reviewed by me and unremarkable Radiology: ordered and independent interpretation performed. Decision-making details documented in ED Course.    Details: Ordered and interpreted by me independently of  radiology CT abdomen pelvis: Shows no acute abnormality Chest x-ray: Shows no acute abnormality ECG/medicine tests: ordered and independent interpretation performed. Decision-making details documented in ED Course.    Details: EKG ordered and inter by me in the absence of cardiology and shows a flutter with variable AV block, rate controlled, no STEMI or significant change otherwise when compared to prior  Risk OTC drugs. Prescription drug management.     Final diagnoses:  Chronic bilateral low back pain without sciatica  Palpitations    ED Discharge Orders          Ordered    lidocaine  (LIDODERM ) 5 %  Every 24 hours        05/14/24 0501               Gennaro Bouchard L, DO 05/14/24 0522

## 2024-05-27 ENCOUNTER — Ambulatory Visit: Attending: Cardiology

## 2024-06-17 ENCOUNTER — Other Ambulatory Visit: Payer: Self-pay

## 2024-06-17 DIAGNOSIS — I4891 Unspecified atrial fibrillation: Secondary | ICD-10-CM

## 2024-06-17 DIAGNOSIS — Z952 Presence of prosthetic heart valve: Secondary | ICD-10-CM

## 2024-06-17 MED ORDER — WARFARIN SODIUM 2.5 MG PO TABS: ORAL_TABLET | ORAL | 0 refills | Status: AC

## 2024-07-02 NOTE — Progress Notes (Unsigned)
 " New Patient Evaluation and Consultation  Referring Provider: Arloa Jarvis, NP PCP: Arloa Jarvis, NP Date of Service: 07/03/2024  SUBJECTIVE Chief Complaint: No chief complaint on file.  History of Present Illness: Rose [name] is a 69 y.o. White or Caucasian female seen in consultation at the request of NP Harris for evaluation of urinary retention.    H/o CVA, T2DM Chronic low back pain History of bladder tack and s/p TVH in 1988  ***Review of records significant for: ***CHF, anemia, A. Fib on anticoagulation, chronic pancreatitis, COPD with tobacco use, GERD, ventral hernia, fibromyalgia, s/p mitral valve replacement   Urinary Symptoms: {urine leakage?:24754} Leaks *** time(s) per {days/wks/mos/yrs:310907}.  Pad use: {NUMBERS 1-10:18281} {pad option:24752} per day.   Patient {ACTION; IS/IS WNU:78978602} bothered by UI symptoms.  Day time voids ***.  Nocturia: *** times per night to void. OSA not on CPAP Voiding dysfunction:  {empties:24755} bladder well.  Patient {DOES NOT does:27190::does not} use a catheter to empty bladder.  When urinating, patient feels {urine symptoms:24756} Drinks: *** per day  UTIs: {NUMBERS 1-10:18281} UTI's in the last year.   {ACTIONS;DENIES/REPORTS:21021675::Denies} history of {urologic concerns:24757} No results found for the last 90 days.   Pelvic Organ Prolapse Symptoms:                  Patient {denies/ admits to:24761} a feeling of a bulge the vaginal area. It has been present for {NUMBER 1-10:22536} {days/wks/mos/yrs:310907}.  Patient {denies/ admits to:24761} seeing a bulge.  This bulge {ACTION; IS/IS WNU:78978602} bothersome.  Bowel Symptom: Bowel movements: *** time(s) per {Time; day/week/month:13537} with constipation Stool consistency: {stool consistency:24758} Straining: {yes/no:19897}.  Splinting: {yes/no:19897}.  Incomplete evacuation: {yes/no:19897}.  Patient {denies/ admits to:24761} accidental bowel leakage / fecal  incontinence  Occurs: *** time(s) per {Time; day/week/month:13537}  Consistency with leakage: {stool consistency:24758} Bowel regimen: {bowel regimen:24759} Last colonoscopy: Date ***, Results *** [facility] Colonoscopy          Current Care Gaps     Colonoscopy (Every 10 Years) Never done    No completion history exists for this topic.                 Sexual Function Sexually active: {yes/no:19897}.  Sexual orientation: {Sexual Orientation:9313996152} Pain with sex: {pain with sex:24762}  Pelvic Pain {denies/ admits to:24761} pelvic pain Location: *** Pain occurs: *** Prior pain treatment: *** Improved by: *** Worsened by: ***   Past Medical History:  Past Medical History:  Diagnosis Date   Allergy    Anemia    Anxiety    Aortic atherosclerosis    Arthritis    Asthma    Back pain    CAD (coronary artery disease)    Chronic headaches    Class 1 obesity due to excess calories with serious comorbidity in adult    Complication of anesthesia    slow to wake up   COPD (chronic obstructive pulmonary disease) (HCC)    Diabetes (HCC)    Fatty liver    GERD (gastroesophageal reflux disease)    Headache    Herniated lumbar intervertebral disc    High cholesterol    History of blood clots    Lumbago with sciatica, unspecified side    Mitral stenosis    severe by 05/18/21 echo   Moderate episode of recurrent major depressive disorder (HCC)    Nicotine  dependence, cigarettes, with other nicotine -induced disorders    Nonrheumatic aortic (valve) insufficiency    Other chronic pain    Pain in joint involving ankle  and foot    Pancreatitis    Pneumonia    Presence urogenital implant    Severe mitral regurgitation    Sleep apnea    no cpap   Stroke (HCC)    mild   SUI (stress urinary incontinence, female)    Tricuspid regurgitation    Type 2 diabetes mellitus with hyperglycemia, with long-term current use of insulin  [facility])      Past Surgical History:   Past  Surgical History:  Procedure Laterality Date   ANKLE SURGERY     x 3   Bladder tack     BREAST REDUCTION SURGERY     CARDIOVERSION N/A 07/02/2023   Procedure: CARDIOVERSION;  Surgeon: Barbaraann Darryle Ned, MD;  Location: [facility] INVASIVE CV LAB;  Service: Cardiovascular;  Laterality: N/A;   CARDIOVERSION N/A 01/18/2024   Procedure: CARDIOVERSION;  Surgeon: Santo Stanly LABOR, MD;  Location: [facility] INVASIVE CV LAB;  Service: Cardiovascular;  Laterality: N/A;   CHOLECYSTECTOMY  1998   HERNIA REPAIR     I & D EXTREMITY Right 10/28/2021   Procedure: IRRIGATION AND DEBRIDEMENT EXTREMITY, GREAT TOE;  Surgeon: Barton Drape, MD;  Location: [facility] OR;  Service: Orthopedics;  Laterality: Right;   MITRAL VALVE REPLACEMENT N/A 05/11/2023   Procedure: MITRAL VALVE (MV) REPLACEMENT USING MOSAIC VALVE SIZE ;  Surgeon: Maryjane Mt, MD;  Location: [facility] OR;  Service: Open Heart Surgery;  Laterality: N/A;   RIGHT/LEFT HEART CATH AND CORONARY ANGIOGRAPHY N/A 11/02/2022   Procedure: RIGHT/LEFT HEART CATH AND CORONARY ANGIOGRAPHY;  Surgeon: Wonda Sharper, MD;  Location: [facility] INVASIVE CV LAB;  Service: Cardiovascular;  Laterality: N/A;   TEE WITHOUT CARDIOVERSION N/A 11/02/2022   Procedure: TRANSESOPHAGEAL ECHOCARDIOGRAM;  Surgeon: Jeffrie Oneil BROCKS, MD;  Location: [facility] INVASIVE CV LAB;  Service: Cardiovascular;  Laterality: N/A;   TEE WITHOUT CARDIOVERSION N/A 05/11/2023   Procedure: TRANSESOPHAGEAL ECHOCARDIOGRAM;  Surgeon: Maryjane Mt, MD;  Location: [facility] OR;  Service: Open Heart Surgery;  Laterality: N/A;   TRANSESOPHAGEAL ECHOCARDIOGRAM (CATH LAB) N/A 07/02/2023   Procedure: TRANSESOPHAGEAL ECHOCARDIOGRAM;  Surgeon: Barbaraann Darryle Ned, MD;  Location: [facility] INVASIVE CV LAB;  Service: Cardiovascular;  Laterality: N/A;   TRANSESOPHAGEAL ECHOCARDIOGRAM (CATH LAB) N/A 01/18/2024   Procedure: TRANSESOPHAGEAL ECHOCARDIOGRAM;  Surgeon: Santo Stanly LABOR, MD;  Location: [facility] INVASIVE CV LAB;  Service: Cardiovascular;   Laterality: N/A;   VAGINAL HYSTERECTOMY  1988     Past OB/GYN History: OB History  No obstetric history on file.    Vaginal deliveries: ***,  Forceps/ Vacuum deliveries: ***, Cesarean section: *** Menopausal: {menopausal:24763} Contraception: ***. Last pap smear was ***.  Any history of abnormal pap smears: {yes/no:19897}. No results found for: DIAGPAP, HPVHIGH, ADEQPAP  Medications: Patient has a current medication list which includes the following prescription(s): albuterol , amiodarone , amoxicillin , amoxicillin -clavulanate, clonazepam , clotrimazole, furosemide , gabapentin , lidocaine , metformin , methocarbamol , multivitamin with minerals, omeprazole, ondansetron , oxygen-helium, creon , potassium chloride , ropinirole , rosuvastatin , sertraline , stiolto respimat , trazodone , and warfarin.   Allergies: Patient is allergic to erythromycin, nickel, oxycodone , meperidine, nicotine , codeine, hydrocodone -acetaminophen , morphine, propoxyphene, and quetiapine.   Social History: Social History[1]  Relationship status: {relationship status:24764} Patient lives with ***.   Patient {ACTION; IS/IS WNU:78978602} employed ***. Regular exercise: {Yes/No:304960894} History of abuse: {Yes/No:304960894}  Family History:   Family History  Problem Relation Age of Onset   Emphysema Mother    Heart disease Mother    Cancer Mother    Heart disease Father    Cancer Sister      Review of Systems: ROS   OBJECTIVE Physical  Exam: There were no vitals filed for this visit.  Physical Exam   GU / Detailed Urogynecologic Evaluation:  Pelvic Exam: Normal external female genitalia; Bartholin's and Skene's glands normal in appearance; urethral meatus normal in appearance, no urethral masses or discharge.   CST: {gen negative/positive:315881}  Reflexes: bulbocavernosis {DESC; PRESENT/NOT PRESENT:21021351}, anocutaneous {DESC; PRESENT/NOT PRESENT:21021351} ***bilaterally.  Speculum exam reveals  normal vaginal mucosa {With/Without:20273} atrophy. Cervix {exam; gyn cervix:30847}. Uterus {exam; pelvic uterus:30849}. Adnexa {exam; adnexa:12223}.    s/p hysterectomy: Speculum exam reveals normal vaginal mucosa {With/Without:20273}  atrophy and normal vaginal cuff.  Adnexa {exam; adnexa:12223}.    With apex supported, anterior compartment defect was {reduced:24765}  Pelvic floor strength {Roman # I-V:19040}/V, puborectalis {Roman # I-V:19040}/V external anal sphincter {Roman # I-V:19040}/V  Pelvic floor musculature: Right levator {Tender/Non-tender:20250}, Right obturator {Tender/Non-tender:20250}, Left levator {Tender/Non-tender:20250}, Left obturator {Tender/Non-tender:20250}  POP-Q:   POP-Q                                               Aa                                               Ba                                                 C                                                Gh                                               Pb                                               tvl                                                Ap                                               Bp                                                 D      Rectal Exam:  Normal sphincter tone, {rectocele:24766} distal rectocele, enterocoele {DESC; PRESENT/NOT PRESENT:21021351}, no rectal  masses, {sign of:24767} dyssynergia when asking the patient to bear down.  Post-Void Residual (PVR) by Bladder Scan: In order to evaluate bladder emptying, we discussed obtaining a postvoid residual and patient agreed to this procedure.  Procedure: The ultrasound unit was placed on the patient's abdomen in the suprapubic region after the patient had voided.      Laboratory Results: Lab Results  Component Value Date   BILIRUBINUR NEGATIVE 05/13/2024   PROTEINUR NEGATIVE 05/13/2024   UROBILINOGEN 0.2 02/03/2009   LEUKOCYTESUR NEGATIVE 05/13/2024    Lab Results  Component Value Date    CREATININE 1.91 (H) 05/13/2024   CREATININE 1.75 (H) 01/14/2024   CREATININE 1.38 (H) 11/09/2023    Lab Results  Component Value Date   HGBA1C 6.3 (H) 06/30/2023    Lab Results  Component Value Date   HGB 10.3 (L) 05/13/2024     ASSESSMENT AND PLAN Rose [name] is a 69 y.o. with: No diagnosis found.  There are no diagnoses linked to this encounter.   Rose ONEIDA Gillis, MD        [1]  Social History Tobacco Use   Smoking status: Every Day    Current packs/day: 0.50    Average packs/day: 0.5 packs/day for 47.0 years (23.5 ttl pk-yrs)    Types: Cigarettes   Smokeless tobacco: Never   Tobacco comments:    0.5 packs smoked daily. ARJ 03/29/21    Patient states she is not ready to give up smoking.   Vaping Use   Vaping status: Former   Substances: Nicotine , Flavoring  Substance Use Topics   Alcohol use: No    Alcohol/week: 0.0 standard drinks of alcohol   Drug use: Not Currently    Types: Marijuana   "

## 2024-07-03 ENCOUNTER — Ambulatory Visit: Admitting: Obstetrics

## 2024-07-16 ENCOUNTER — Ambulatory Visit: Admitting: Cardiology

## 2024-07-24 ENCOUNTER — Ambulatory Visit: Admitting: Emergency Medicine

## 2024-09-26 ENCOUNTER — Ambulatory Visit: Admitting: Obstetrics

## 2024-11-25 ENCOUNTER — Ambulatory Visit: Admitting: Physician Assistant
